# Patient Record
Sex: Female | Born: 1935
Health system: Southern US, Community
[De-identification: ages and names within clinical notes are randomized; demographics above are authoritative.]

## PROBLEM LIST (undated history)

## (undated) DIAGNOSIS — K219 Gastro-esophageal reflux disease without esophagitis: Secondary | ICD-10-CM

## (undated) DIAGNOSIS — I10 Essential (primary) hypertension: Secondary | ICD-10-CM

## (undated) DIAGNOSIS — J039 Acute tonsillitis, unspecified: Secondary | ICD-10-CM

## (undated) DIAGNOSIS — Z950 Presence of cardiac pacemaker: Secondary | ICD-10-CM

## (undated) DIAGNOSIS — K449 Diaphragmatic hernia without obstruction or gangrene: Secondary | ICD-10-CM

## (undated) DIAGNOSIS — R51 Headache: Secondary | ICD-10-CM

## (undated) DIAGNOSIS — T8859XA Other complications of anesthesia, initial encounter: Secondary | ICD-10-CM

## (undated) DIAGNOSIS — E785 Hyperlipidemia, unspecified: Secondary | ICD-10-CM

## (undated) DIAGNOSIS — T4145XA Adverse effect of unspecified anesthetic, initial encounter: Secondary | ICD-10-CM

## (undated) DIAGNOSIS — K529 Noninfective gastroenteritis and colitis, unspecified: Secondary | ICD-10-CM

## (undated) DIAGNOSIS — Z8719 Personal history of other diseases of the digestive system: Secondary | ICD-10-CM

## (undated) DIAGNOSIS — K635 Polyp of colon: Secondary | ICD-10-CM

## (undated) DIAGNOSIS — F419 Anxiety disorder, unspecified: Secondary | ICD-10-CM

## (undated) HISTORY — DX: Hyperlipidemia, unspecified: E78.5

## (undated) HISTORY — DX: Noninfective gastroenteritis and colitis, unspecified: K52.9

## (undated) HISTORY — DX: Essential (primary) hypertension: I10

## (undated) HISTORY — PX: BREAST LUMPECTOMY: SHX2

## (undated) HISTORY — DX: Polyp of colon: K63.5

## (undated) HISTORY — DX: Presence of cardiac pacemaker: Z95.0

## (undated) HISTORY — PX: HERNIA REPAIR: SHX51

## (undated) HISTORY — PX: BUNIONECTOMY: SHX129

## (undated) HISTORY — DX: Gastro-esophageal reflux disease without esophagitis: K21.9

## (undated) HISTORY — DX: Diaphragmatic hernia without obstruction or gangrene: K44.9

## (undated) HISTORY — DX: Acute tonsillitis, unspecified: J03.90

---

## 1968-11-12 HISTORY — PX: APPENDECTOMY: SHX54

## 1968-11-12 HISTORY — PX: CHOLECYSTECTOMY: SHX55

## 1974-11-12 HISTORY — PX: ABDOMINAL HYSTERECTOMY: SHX81

## 1999-06-21 ENCOUNTER — Other Ambulatory Visit: Admission: RE | Admit: 1999-06-21 | Discharge: 1999-06-21 | Payer: Self-pay | Admitting: General Surgery

## 1999-06-21 ENCOUNTER — Encounter (INDEPENDENT_AMBULATORY_CARE_PROVIDER_SITE_OTHER): Payer: Self-pay | Admitting: Specialist

## 2000-03-07 ENCOUNTER — Ambulatory Visit (HOSPITAL_COMMUNITY): Admission: AD | Admit: 2000-03-07 | Discharge: 2000-03-07 | Payer: Self-pay | Admitting: Obstetrics & Gynecology

## 2001-07-22 ENCOUNTER — Encounter: Payer: Self-pay | Admitting: Internal Medicine

## 2001-07-22 ENCOUNTER — Ambulatory Visit (HOSPITAL_COMMUNITY): Admission: RE | Admit: 2001-07-22 | Discharge: 2001-07-22 | Payer: Self-pay | Admitting: Internal Medicine

## 2001-11-12 DIAGNOSIS — K635 Polyp of colon: Secondary | ICD-10-CM

## 2001-11-12 HISTORY — PX: COLONOSCOPY W/ POLYPECTOMY: SHX1380

## 2001-11-12 HISTORY — DX: Polyp of colon: K63.5

## 2004-11-03 ENCOUNTER — Ambulatory Visit: Payer: Self-pay | Admitting: Internal Medicine

## 2004-11-12 DIAGNOSIS — J039 Acute tonsillitis, unspecified: Secondary | ICD-10-CM

## 2004-11-12 HISTORY — DX: Acute tonsillitis, unspecified: J03.90

## 2004-12-12 ENCOUNTER — Ambulatory Visit: Payer: Self-pay | Admitting: Internal Medicine

## 2005-01-04 ENCOUNTER — Inpatient Hospital Stay (HOSPITAL_COMMUNITY): Admission: AD | Admit: 2005-01-04 | Discharge: 2005-01-06 | Payer: Self-pay | Admitting: Internal Medicine

## 2005-01-04 ENCOUNTER — Ambulatory Visit: Payer: Self-pay | Admitting: Internal Medicine

## 2005-01-11 ENCOUNTER — Ambulatory Visit: Payer: Self-pay | Admitting: Internal Medicine

## 2005-01-26 ENCOUNTER — Emergency Department (HOSPITAL_COMMUNITY): Admission: EM | Admit: 2005-01-26 | Discharge: 2005-01-26 | Payer: Self-pay | Admitting: Emergency Medicine

## 2005-01-29 ENCOUNTER — Ambulatory Visit: Payer: Self-pay | Admitting: Internal Medicine

## 2005-02-01 ENCOUNTER — Ambulatory Visit: Payer: Self-pay

## 2005-05-17 ENCOUNTER — Ambulatory Visit: Payer: Self-pay | Admitting: Internal Medicine

## 2005-06-13 ENCOUNTER — Ambulatory Visit: Payer: Self-pay | Admitting: Internal Medicine

## 2005-08-02 ENCOUNTER — Ambulatory Visit: Payer: Self-pay | Admitting: Internal Medicine

## 2005-08-16 ENCOUNTER — Ambulatory Visit: Payer: Self-pay | Admitting: Internal Medicine

## 2005-08-28 ENCOUNTER — Encounter: Admission: RE | Admit: 2005-08-28 | Discharge: 2005-08-28 | Payer: Self-pay | Admitting: Internal Medicine

## 2005-08-31 ENCOUNTER — Ambulatory Visit: Payer: Self-pay | Admitting: Internal Medicine

## 2006-01-09 ENCOUNTER — Ambulatory Visit: Payer: Self-pay | Admitting: Internal Medicine

## 2006-01-24 ENCOUNTER — Ambulatory Visit: Payer: Self-pay | Admitting: Internal Medicine

## 2006-05-13 ENCOUNTER — Ambulatory Visit: Payer: Self-pay | Admitting: Internal Medicine

## 2006-05-20 ENCOUNTER — Ambulatory Visit: Payer: Self-pay | Admitting: Internal Medicine

## 2006-12-13 ENCOUNTER — Ambulatory Visit: Payer: Self-pay | Admitting: Internal Medicine

## 2006-12-13 LAB — CONVERTED CEMR LAB
ALT: 16 units/L (ref 0–40)
AST: 23 units/L (ref 0–37)
BUN: 18 mg/dL (ref 6–23)
Cholesterol: 153 mg/dL (ref 0–200)
Creatinine, Ser: 1.3 mg/dL — ABNORMAL HIGH (ref 0.4–1.2)
Creatinine,U: 119.8 mg/dL
HDL: 48.4 mg/dL (ref >39.0)
Hgb A1c MFr Bld: 5.7 % (ref 4.6–6.0)
LDL Cholesterol: 85 mg/dL (ref 0–99)
Microalb Creat Ratio: 4.2 mg/g (ref 0.0–30.0)
Microalb, Ur: 0.5 mg/dL (ref 0.0–1.9)
Potassium: 3.6 meq/L (ref 3.5–5.1)
Total CHOL/HDL Ratio: 3.2
Triglycerides: 97 mg/dL (ref 0–149)
VLDL: 19 mg/dL (ref 0–40)

## 2006-12-23 ENCOUNTER — Ambulatory Visit: Payer: Self-pay | Admitting: Internal Medicine

## 2007-08-05 ENCOUNTER — Telehealth (INDEPENDENT_AMBULATORY_CARE_PROVIDER_SITE_OTHER): Payer: Self-pay | Admitting: *Deleted

## 2007-08-06 ENCOUNTER — Ambulatory Visit: Payer: Self-pay | Admitting: Internal Medicine

## 2007-08-10 LAB — CONVERTED CEMR LAB
ALT: 19 U/L
AST: 27 U/L
BUN: 19 mg/dL
Cholesterol: 140 mg/dL
Creatinine, Ser: 1.3 mg/dL — ABNORMAL HIGH
Creatinine,U: 117.6 mg/dL
HDL: 44.8 mg/dL
Hgb A1c MFr Bld: 5.8 %
LDL Cholesterol: 78 mg/dL
Microalb Creat Ratio: 11.9 mg/g
Microalb, Ur: 1.4 mg/dL
Potassium: 3.8 meq/L
Total CHOL/HDL Ratio: 3.1
Triglycerides: 84 mg/dL
VLDL: 17 mg/dL

## 2007-08-11 ENCOUNTER — Encounter (INDEPENDENT_AMBULATORY_CARE_PROVIDER_SITE_OTHER): Payer: Self-pay | Admitting: *Deleted

## 2007-08-12 ENCOUNTER — Ambulatory Visit: Payer: Self-pay | Admitting: Internal Medicine

## 2007-08-12 DIAGNOSIS — K219 Gastro-esophageal reflux disease without esophagitis: Secondary | ICD-10-CM

## 2007-08-12 DIAGNOSIS — E782 Mixed hyperlipidemia: Secondary | ICD-10-CM | POA: Insufficient documentation

## 2007-08-12 DIAGNOSIS — I10 Essential (primary) hypertension: Secondary | ICD-10-CM

## 2007-08-12 DIAGNOSIS — E8881 Metabolic syndrome: Secondary | ICD-10-CM

## 2007-08-12 LAB — CONVERTED CEMR LAB
Cholesterol, target level: 200 mg/dL
HDL goal, serum: 40 mg/dL
LDL Goal: 160 mg/dL

## 2007-08-20 ENCOUNTER — Ambulatory Visit: Payer: Self-pay | Admitting: Family Medicine

## 2007-08-20 ENCOUNTER — Encounter (INDEPENDENT_AMBULATORY_CARE_PROVIDER_SITE_OTHER): Payer: Self-pay | Admitting: *Deleted

## 2007-10-20 ENCOUNTER — Encounter: Payer: Self-pay | Admitting: Internal Medicine

## 2008-02-12 ENCOUNTER — Ambulatory Visit: Payer: Self-pay | Admitting: Internal Medicine

## 2008-02-14 LAB — CONVERTED CEMR LAB: Hgb A1c MFr Bld: 5.6 % (ref 4.6–6.0)

## 2008-02-16 ENCOUNTER — Encounter (INDEPENDENT_AMBULATORY_CARE_PROVIDER_SITE_OTHER): Payer: Self-pay | Admitting: *Deleted

## 2008-02-19 ENCOUNTER — Ambulatory Visit: Payer: Self-pay | Admitting: Internal Medicine

## 2008-02-19 DIAGNOSIS — IMO0001 Reserved for inherently not codable concepts without codable children: Secondary | ICD-10-CM | POA: Insufficient documentation

## 2008-02-19 DIAGNOSIS — R61 Generalized hyperhidrosis: Secondary | ICD-10-CM

## 2008-04-20 ENCOUNTER — Ambulatory Visit: Payer: Self-pay | Admitting: Internal Medicine

## 2008-04-20 DIAGNOSIS — F411 Generalized anxiety disorder: Secondary | ICD-10-CM | POA: Insufficient documentation

## 2008-04-23 ENCOUNTER — Telehealth (INDEPENDENT_AMBULATORY_CARE_PROVIDER_SITE_OTHER): Payer: Self-pay | Admitting: *Deleted

## 2008-04-23 LAB — CONVERTED CEMR LAB: Potassium: 4 meq/L (ref 3.5–5.1)

## 2008-05-05 ENCOUNTER — Ambulatory Visit: Payer: Self-pay | Admitting: Internal Medicine

## 2008-05-05 DIAGNOSIS — N183 Chronic kidney disease, stage 3 (moderate): Secondary | ICD-10-CM

## 2008-06-07 ENCOUNTER — Ambulatory Visit: Payer: Self-pay | Admitting: Internal Medicine

## 2008-06-14 LAB — CONVERTED CEMR LAB
BUN: 18 mg/dL (ref 6–23)
Creatinine, Ser: 1.5 mg/dL — ABNORMAL HIGH (ref 0.4–1.2)

## 2008-06-16 ENCOUNTER — Encounter (INDEPENDENT_AMBULATORY_CARE_PROVIDER_SITE_OTHER): Payer: Self-pay | Admitting: *Deleted

## 2008-07-12 ENCOUNTER — Telehealth (INDEPENDENT_AMBULATORY_CARE_PROVIDER_SITE_OTHER): Payer: Self-pay | Admitting: *Deleted

## 2008-07-22 ENCOUNTER — Telehealth (INDEPENDENT_AMBULATORY_CARE_PROVIDER_SITE_OTHER): Payer: Self-pay | Admitting: *Deleted

## 2008-07-22 ENCOUNTER — Encounter: Payer: Self-pay | Admitting: Internal Medicine

## 2008-07-26 ENCOUNTER — Ambulatory Visit: Payer: Self-pay | Admitting: Internal Medicine

## 2008-07-30 ENCOUNTER — Encounter (INDEPENDENT_AMBULATORY_CARE_PROVIDER_SITE_OTHER): Payer: Self-pay | Admitting: *Deleted

## 2008-07-30 LAB — CONVERTED CEMR LAB
AST: 22 units/L (ref 0–37)
BUN: 24 mg/dL — ABNORMAL HIGH (ref 6–23)
Cholesterol: 123 mg/dL (ref 0–200)
Creatinine,U: 82.9 mg/dL
HDL: 39.1 mg/dL (ref >39.0)
Hgb A1c MFr Bld: 5.9 % (ref 4.6–6.0)
Potassium: 4.6 meq/L (ref 3.5–5.1)

## 2008-08-02 ENCOUNTER — Telehealth (INDEPENDENT_AMBULATORY_CARE_PROVIDER_SITE_OTHER): Payer: Self-pay | Admitting: *Deleted

## 2008-08-10 ENCOUNTER — Ambulatory Visit: Payer: Self-pay | Admitting: Internal Medicine

## 2008-08-16 ENCOUNTER — Telehealth (INDEPENDENT_AMBULATORY_CARE_PROVIDER_SITE_OTHER): Payer: Self-pay | Admitting: *Deleted

## 2008-09-16 ENCOUNTER — Ambulatory Visit: Payer: Self-pay | Admitting: Internal Medicine

## 2008-09-23 ENCOUNTER — Encounter: Admission: RE | Admit: 2008-09-23 | Discharge: 2008-09-23 | Payer: Self-pay | Admitting: Internal Medicine

## 2008-09-24 ENCOUNTER — Telehealth (INDEPENDENT_AMBULATORY_CARE_PROVIDER_SITE_OTHER): Payer: Self-pay | Admitting: *Deleted

## 2008-10-18 ENCOUNTER — Telehealth (INDEPENDENT_AMBULATORY_CARE_PROVIDER_SITE_OTHER): Payer: Self-pay | Admitting: *Deleted

## 2008-10-20 ENCOUNTER — Encounter: Payer: Self-pay | Admitting: Internal Medicine

## 2008-11-09 ENCOUNTER — Ambulatory Visit: Payer: Self-pay | Admitting: Internal Medicine

## 2008-11-09 LAB — CONVERTED CEMR LAB: Creatinine, Ser: 1.2 mg/dL (ref 0.4–1.2)

## 2008-11-10 ENCOUNTER — Encounter (INDEPENDENT_AMBULATORY_CARE_PROVIDER_SITE_OTHER): Payer: Self-pay | Admitting: *Deleted

## 2008-11-30 ENCOUNTER — Telehealth (INDEPENDENT_AMBULATORY_CARE_PROVIDER_SITE_OTHER): Payer: Self-pay | Admitting: *Deleted

## 2009-04-11 ENCOUNTER — Encounter: Payer: Self-pay | Admitting: Internal Medicine

## 2009-04-18 ENCOUNTER — Telehealth (INDEPENDENT_AMBULATORY_CARE_PROVIDER_SITE_OTHER): Payer: Self-pay | Admitting: *Deleted

## 2009-05-05 ENCOUNTER — Ambulatory Visit: Payer: Self-pay | Admitting: Internal Medicine

## 2009-05-16 LAB — CONVERTED CEMR LAB
BUN: 23 mg/dL (ref 6–23)
Creatinine, Ser: 1.4 mg/dL — ABNORMAL HIGH (ref 0.4–1.2)
Hgb A1c MFr Bld: 6 % (ref 4.6–6.5)

## 2009-05-19 ENCOUNTER — Telehealth (INDEPENDENT_AMBULATORY_CARE_PROVIDER_SITE_OTHER): Payer: Self-pay | Admitting: *Deleted

## 2009-05-19 ENCOUNTER — Encounter (INDEPENDENT_AMBULATORY_CARE_PROVIDER_SITE_OTHER): Payer: Self-pay | Admitting: *Deleted

## 2009-07-14 ENCOUNTER — Ambulatory Visit: Payer: Self-pay | Admitting: Internal Medicine

## 2009-07-22 LAB — CONVERTED CEMR LAB: Creatinine, Ser: 1.3 mg/dL — ABNORMAL HIGH (ref 0.4–1.2)

## 2009-07-26 ENCOUNTER — Encounter (INDEPENDENT_AMBULATORY_CARE_PROVIDER_SITE_OTHER): Payer: Self-pay | Admitting: *Deleted

## 2009-08-10 ENCOUNTER — Encounter: Payer: Self-pay | Admitting: Internal Medicine

## 2009-08-31 ENCOUNTER — Encounter (INDEPENDENT_AMBULATORY_CARE_PROVIDER_SITE_OTHER): Payer: Self-pay | Admitting: *Deleted

## 2009-08-31 ENCOUNTER — Ambulatory Visit: Payer: Self-pay | Admitting: Internal Medicine

## 2009-09-04 LAB — CONVERTED CEMR LAB
Bilirubin, Direct: 0 mg/dL (ref 0.0–0.3)
LDL Cholesterol: 94 mg/dL (ref 0–99)
Total Bilirubin: 0.8 mg/dL (ref 0.3–1.2)
VLDL: 23.4 mg/dL (ref 0.0–40.0)

## 2009-09-05 ENCOUNTER — Encounter (INDEPENDENT_AMBULATORY_CARE_PROVIDER_SITE_OTHER): Payer: Self-pay | Admitting: *Deleted

## 2009-09-06 ENCOUNTER — Ambulatory Visit: Payer: Self-pay | Admitting: Internal Medicine

## 2009-09-06 DIAGNOSIS — R079 Chest pain, unspecified: Secondary | ICD-10-CM

## 2009-10-03 ENCOUNTER — Ambulatory Visit: Payer: Self-pay | Admitting: Internal Medicine

## 2009-10-10 ENCOUNTER — Telehealth (INDEPENDENT_AMBULATORY_CARE_PROVIDER_SITE_OTHER): Payer: Self-pay | Admitting: *Deleted

## 2009-10-11 ENCOUNTER — Encounter (HOSPITAL_COMMUNITY): Admission: RE | Admit: 2009-10-11 | Discharge: 2009-11-11 | Payer: Self-pay | Admitting: Internal Medicine

## 2009-10-11 ENCOUNTER — Ambulatory Visit: Payer: Self-pay | Admitting: Cardiovascular Disease

## 2009-10-11 ENCOUNTER — Ambulatory Visit: Payer: Self-pay

## 2009-10-12 ENCOUNTER — Encounter: Payer: Self-pay | Admitting: Cardiovascular Disease

## 2009-10-18 ENCOUNTER — Telehealth (INDEPENDENT_AMBULATORY_CARE_PROVIDER_SITE_OTHER): Payer: Self-pay | Admitting: *Deleted

## 2009-10-19 ENCOUNTER — Encounter: Payer: Self-pay | Admitting: Internal Medicine

## 2009-11-15 ENCOUNTER — Ambulatory Visit: Payer: Self-pay | Admitting: Internal Medicine

## 2009-12-23 ENCOUNTER — Ambulatory Visit: Payer: Self-pay | Admitting: Internal Medicine

## 2009-12-23 DIAGNOSIS — I498 Other specified cardiac arrhythmias: Secondary | ICD-10-CM

## 2010-04-05 ENCOUNTER — Emergency Department (HOSPITAL_COMMUNITY): Admission: EM | Admit: 2010-04-05 | Discharge: 2010-04-05 | Payer: Self-pay | Admitting: Emergency Medicine

## 2010-04-11 ENCOUNTER — Ambulatory Visit: Payer: Self-pay | Admitting: Internal Medicine

## 2010-04-11 DIAGNOSIS — S02400A Malar fracture unspecified, initial encounter for closed fracture: Secondary | ICD-10-CM | POA: Insufficient documentation

## 2010-04-11 DIAGNOSIS — S02401A Maxillary fracture, unspecified, initial encounter for closed fracture: Secondary | ICD-10-CM

## 2010-04-14 ENCOUNTER — Encounter: Payer: Self-pay | Admitting: Internal Medicine

## 2010-10-02 ENCOUNTER — Encounter: Payer: Self-pay | Admitting: Internal Medicine

## 2010-10-02 ENCOUNTER — Ambulatory Visit: Payer: Self-pay | Admitting: Internal Medicine

## 2010-10-02 DIAGNOSIS — R109 Unspecified abdominal pain: Secondary | ICD-10-CM | POA: Insufficient documentation

## 2010-10-09 LAB — CONVERTED CEMR LAB
ALT: 19 units/L (ref 0–35)
BUN: 18 mg/dL (ref 6–23)
Bilirubin, Direct: 0.1 mg/dL (ref 0.0–0.3)
Cholesterol: 157 mg/dL (ref 0–200)
Creatinine, Ser: 1.3 mg/dL — ABNORMAL HIGH (ref 0.4–1.2)
Creatinine,U: 195.6 mg/dL
Eosinophils Relative: 3.1 % (ref 0.0–5.0)
GFR calc non Af Amer: 41.64 mL/min (ref >60)
Hgb A1c MFr Bld: 5.8 % (ref 4.6–6.5)
LDL Cholesterol: 95 mg/dL (ref 0–99)
MCV: 88.7 fL (ref 78.0–100.0)
Microalb Creat Ratio: 1.1 mg/g (ref 0.0–30.0)
Monocytes Absolute: 0.5 10*3/uL (ref 0.1–1.0)
Monocytes Relative: 11.1 % (ref 3.0–12.0)
Neutrophils Relative %: 55.2 % (ref 43.0–77.0)
Platelets: 212 10*3/uL (ref 150.0–400.0)
Total Bilirubin: 0.6 mg/dL (ref 0.3–1.2)
Triglycerides: 95 mg/dL (ref 0.0–149.0)
VLDL: 19 mg/dL (ref 0.0–40.0)
WBC: 4.9 10*3/uL (ref 4.5–10.5)

## 2010-10-10 ENCOUNTER — Ambulatory Visit: Payer: Self-pay | Admitting: Internal Medicine

## 2010-10-18 ENCOUNTER — Encounter (INDEPENDENT_AMBULATORY_CARE_PROVIDER_SITE_OTHER): Payer: Self-pay | Admitting: *Deleted

## 2010-10-18 ENCOUNTER — Ambulatory Visit: Payer: Self-pay | Admitting: Internal Medicine

## 2010-10-18 LAB — CONVERTED CEMR LAB: OCCULT 1: NEGATIVE

## 2010-12-14 NOTE — Letter (Signed)
Summary: Patient Questionnaire  Patient Questionnaire   Imported By: Lanelle Bal 12/01/2010 09:59:32  _____________________________________________________________________  External Attachment:    Type:   Image     Comment:   External Document

## 2010-12-14 NOTE — Consult Note (Signed)
Summary: [facility]  [facility]   Imported By: Lanelle Bal 04/24/2010 13:59:49  _____________________________________________________________________  External Attachment:    Type:   Image     Comment:   External Document

## 2010-12-14 NOTE — Assessment & Plan Note (Signed)
Summary: cpx & lab/cbs   Vital Signs:  Patient profile:   75 year old female Height:      60.75 inches Weight:      202.6 pounds BMI:     38.74 Temp:     97.3 degrees F oral Pulse rate:   64 / minute Resp:     16 per minute BP sitting:   124 / 80  (left arm) Cuff size:   large  Vitals Entered By: Shonna Chock CMA (October 02, 2010 10:10 AM) CC: CPX with fasting labs , Abdominal pain Comments Patient aware TD and pneumonia vaccine should be updated    Primary Care Provider:  Marga Melnick MD  CC:  CPX with fasting labs  and Abdominal pain.  History of Present Illness: Here for Medicare AWV:  1.   Risk factors based on Past M, S, F history: ERD;HTN; DM( chart updated) 2.   Physical Activities: walking, statinary bike, treadmill 3-4X/weekX 30 min 3.   Depression/mood:issues  denied 4.   Hearing: whisper heard @ 6 ft 5.   ADL's: no limitations 6.   Fall Risk: none ; she did trip X1 this year & sustained facial trauma 7.   Home Safety: plans being made to put barrier in front of brick hearth 8.   Height, weight, &visual acuity:wall chart read @ 6 ft w/o lenses 9.   Counseling: POA not in place 10.   Labs ordered based on risk factors: see Orders 11.           Referral Coordination: none requested  12.           Care Plan: see Instructions 13.            Cognitive Assessment: Oriented X 3; memory & recall excellent; "WORLD" spelled backwards; mood & affect normal. Abdominal Pain      This is a 75 year old woman who  describes s with Abdominal pain for 10 years.  The patient denies nausea, vomiting, diarrhea, constipation, melena, hematochezia, and hematemesis.  The location of the pain is right upper quadrant and left upper quadrant.  The pain is described as intermittent and sharp.  The patient denies the following symptoms: jaundice, dark urine, and vaginal bleeding.  The pain is worse with  fried  food   or lettuce over several days.  The pain is better with antacids, TUMS.   Evaluation included colonoscopy.  Preventive Screening-Counseling & Management  Alcohol-Tobacco     Alcohol drinks/day: 0     Smoking Status: never  Caffeine-Diet-Exercise     Caffeine use/day: all decaf     Diet Comments: decreased hyperglycemic carbs  Hep-HIV-STD-Contraception     Dental Visit-last 6 months yes     Sun Exposure-Excessive: no  Safety-Violence-Falls     Seat Belt Use: yes     Firearms in the Home: firearms in the home     Firearm Counseling: not indicated; uses recommended firearm safety measures      Blood Transfusions:  no.        Travel History:  never.    Current Medications (verified): 1)  Actoplus Met 15-500 Mg  Tabs (Pioglitazone Hcl-Metformin Hcl) .... **appointment Due**take One Tablet  Once Daily With Largest Meal 2)  Toprol Xl 100 Mg Xr24h-Tab (Metoprolol Succinate) .... Take One Tablet Daily **yearly Appointment Due** 3)  Fish Oil 500 Mg  Caps (Omega-3 Fatty Acids) .Marland Kitchen.. 1 Tabs Daily 4)  Vytorin 10-20 Mg  Tabs (Ezetimibe-Simvastatin) .Marland Kitchen.. 1 By Mouth  Qhs 5)  Asa 81mg  Qd 6)  Multivitamin 7)  Folic Acid 8)  Gabapentin 100 Mg  Caps (Gabapentin) .Marland Kitchen.. 1 By Mouth Every 8 Hours As Needed For Headaches 9)  Prilosec Otc 20 Mg Tbec (Omeprazole Magnesium) .Marland Kitchen.. 1 Tab Once Daily 10)  Citalopram Hydrobromide 20 Mg  Tabs (Citalopram Hydrobromide) .Marland Kitchen.. 1 Once Daily 11)  Vitamin D3 1000 Unit Tabs (Cholecalciferol) .Marland Kitchen.. 1 By Mouth Once Daily 12)  Cal-Mag-Vit-D (1200mg -600mg -100iu) .... 3 By Mouth At Bedtime 13)  Cozaar 100 Mg Tabs (Losartan Potassium) .Marland Kitchen.. 1 Once Daily 14)  Amlodipine Besylate 5 Mg Tabs (Amlodipine Besylate) .Marland Kitchen.. 1 Tablet Two Times Per Day  Allergies: 1)  ! Hydrochlorothiazide 2)  ! * Evista 3)  ! Vioxx 4)  ! * Hyzaar  Past History:  Past Medical History: Hyperlipidemia Hypertension with LVH on ECHO Gastroenteritis with renal insufficiency  in context of protracted nausea & vomiting  2006 ; Tonsilitis 2006  Past Surgical  History: Appendectomy Cholecystectomyin 1970s Hysterectomy & BSO for endometriosis & benign tumor Lumpectomy X 2 Colon polypectomy 2003  Dr Leone Payor  Family History:  Father: MI  @ age 14, d age 70 Mother:CAD,IDDM, CABG @ 44 Siblings: sister :CABG @ 45; son: ? lymphatic cancer ;PGF: CVA;MGF: CAD;PGM: aneurysm;M aunts: DM, stomach & breast cancer  Social History: Married Retired Never Smoked Alcohol use-no Caffeine use/day:  all decaf Dental Care w/in 6 mos.:  yes Sun Exposure-Excessive:  no Risk analyst Use:  yes Blood Transfusions:  no  Review of Systems  The patient denies anorexia, fever, weight loss, weight gain, vision loss, decreased hearing, hoarseness, chest pain, syncope, dyspnea on exertion, peripheral edema, prolonged cough, headaches, hemoptysis, hematuria, incontinence, suspicious skin lesions, depression, unusual weight change, abnormal bleeding, enlarged lymph nodes, and angioedema.    Physical Exam  General:  well-nourished,in no acute distress; alert,appropriate and cooperative throughout examination Head:  Normocephalic and atraumatic without obvious abnormalities.  Eyes:  No corneal or conjunctival inflammation noted. Perrla. Funduscopic exam benign, without hemorrhages, exudates or papilledema.No icterus Ears:  External ear exam shows no significant lesions or deformities.  Otoscopic examination reveals clear canals, tympanic membranes are intact bilaterally without bulging, retraction, inflammation or discharge. Hearing is grossly normal bilaterally. Nose:  External nasal examination shows no deformity or inflammation. Nasal mucosa are pink and moist without lesions or exudates. Mouth:  Oral mucosa and oropharynx without lesions or exudates.  Teeth in good repair. Neck:  No deformities, masses, or tenderness noted. Lungs:  Normal respiratory effort, chest expands symmetrically. Lungs are clear to auscultation, no crackles or wheezes. Heart:  normal rate, regular  rhythm, no gallop, no rub, no JVD, no HJR, and grade 1 /6 systolic murmur.   Abdomen:  Bowel sounds positive,abdomen soft and non-tender without masses, organomegaly or hernias noted. RUQ op scar Genitalia:  Dr Ambrose Mantle, Gyn of record Msk:  lORDOSIS OF THORACIC SPINE Pulses:  R and L carotid,radial,dorsalis pedis and posterior tibial pulses are full and equal bilaterally Extremities:  No clubbing, cyanosis, edema, or deformity noted with normal full range of motion of all joints.   Neurologic:  alert & oriented X3 and DTRs symmetrical and normal.   Skin:  Intact without suspicious lesions or rashes Cervical Nodes:  No lymphadenopathy noted Axillary Nodes:  No palpable lymphadenopathy Psych:  memory intact for recent and remote, normally interactive, and good eye contact.     Impression & Recommendations:  Problem # 1:  PREVENTIVE HEALTH CARE (ICD-V70.0)  Problem # 2:  ABDOMINAL PAIN,  CHRONIC (ICD-789.00)  > 10 years  Orders: Venipuncture (44010) TLB-CBC Platelet - w/Differential (85025-CBCD)  Problem # 3:  ACID REFLUX DISEASE (ICD-530.81)  Her updated medication list for this problem includes:    Prilosec Otc 20 Mg Tbec (Omeprazole magnesium) .Marland Kitchen... 1 tab once daily  Orders: Venipuncture (27253)  Problem # 4:  DISORDER, DYSMETABOLIC SYNDROME X (ICD-277.7)  Orders: TLB-A1C / Hgb A1C (Glycohemoglobin) (83036-A1C) TLB-Microalbumin/Creat Ratio, Urine (82043-MALB)  Problem # 5:  HYPERTENSION, ESSENTIAL NOS (ICD-401.9)  Her updated medication list for this problem includes:    Toprol Xl 100 Mg Xr24h-tab (Metoprolol succinate) .Marland Kitchen... Take one tablet daily **yearly appointment due**    Cozaar 100 Mg Tabs (Losartan potassium) .Marland Kitchen... 1 once daily    Amlodipine Besylate 5 Mg Tabs (Amlodipine besylate) .Marland Kitchen... 1 tablet two times per day  Orders: EKG w/ Interpretation (93000) Venipuncture (66440) TLB-BMP (Basic Metabolic Panel-BMET) (80048-METABOL)  Problem # 6:  HYPERLIPIDEMIA  (ICD-272.2)  Her updated medication list for this problem includes:    Vytorin 10-20 Mg Tabs (Ezetimibe-simvastatin) .Marland Kitchen... 1 by mouth qhs  Orders: Venipuncture (34742) TLB-Lipid Panel (80061-LIPID) TLB-Hepatic/Liver Function Pnl (80076-HEPATIC) TLB-TSH (Thyroid Stimulating Hormone) (84443-TSH)  Complete Medication List: 1)  Actoplus Met 15-500 Mg Tabs (Pioglitazone hcl-metformin hcl) .... **appointment due**take one tablet  once daily with largest meal 2)  Toprol Xl 100 Mg Xr24h-tab (Metoprolol succinate) .... Take one tablet daily **yearly appointment due** 3)  Fish Oil 500 Mg Caps (Omega-3 fatty acids) .Marland Kitchen.. 1 tabs daily 4)  Vytorin 10-20 Mg Tabs (Ezetimibe-simvastatin) .Marland Kitchen.. 1 by mouth qhs 5)  Asa 81mg  Qd  6)  Multivitamin  7)  Folic Acid  8)  Gabapentin 595 Mg Caps (Gabapentin) .Marland Kitchen.. 1 by mouth every 8 hours as needed for headaches 9)  Prilosec Otc 20 Mg Tbec (Omeprazole magnesium) .Marland Kitchen.. 1 tab once daily 10)  Citalopram Hydrobromide 20 Mg Tabs (Citalopram hydrobromide) .Marland Kitchen.. 1 once daily 11)  Vitamin D3 1000 Unit Tabs (Cholecalciferol) .Marland Kitchen.. 1 by mouth once daily 12)  Cal-mag-vit-d (1200mg -600mg -100iu)  .... 3 by mouth at bedtime 13)  Cozaar 100 Mg Tabs (Losartan potassium) .Marland Kitchen.. 1 once daily 14)  Amlodipine Besylate 5 Mg Tabs (Amlodipine besylate) .Marland Kitchen.. 1 tablet two times per day  Other Orders: Flu Vaccine 2yrs + MEDICARE PATIENTS (G3875) Administration Flu vaccine - MCR (G0008) MC -Subsequent Annual Wellness Visit [phone])  Patient Instructions: 1)  Consider completing  Healthcare    POA as discussed . 2)  Avoid foods high in acid (tomatoes, citrus juices, spicy foods). Avoid eating within two hours of lying down or before exercising. Do not over eat; try smaller more frequent meals. Elevate head of bed twelve inches when sleeping. Complete stool cards. 3)  Check your Blood Pressure regularly. If it is above: 135/85 ON AVERAGE  you should make an appointment. Colonoscopy as per Dr  Leone Payor Prescriptions: AMLODIPINE BESYLATE 5 MG TABS (AMLODIPINE BESYLATE) 1 tablet two times per day  #180 x 3   Entered and Authorized by:   Marga Melnick MD   Signed by:   Marga Melnick MD on 10/02/2010   Method used:   Print then Give to Patient   RxID:   [id number]    Orders Added: 1)  Flu Vaccine 67yrs + MEDICARE PATIENTS [Q2039] 2)  Administration Flu vaccine - MCR [G0008] 3)  MC -Subsequent Annual Wellness Visit [G0439] 4)  Est. Patient Level III [10932] 5)  EKG w/ Interpretation [93000] 6)  Venipuncture [36415] 7)  TLB-Lipid Panel [80061-LIPID] 8)  TLB-BMP (Basic  Metabolic Panel-BMET) [80048-METABOL] 9)  TLB-CBC Platelet - w/Differential [85025-CBCD] 10)  TLB-Hepatic/Liver Function Pnl [80076-HEPATIC] 11)  TLB-TSH (Thyroid Stimulating Hormone) [84443-TSH] 12)  TLB-A1C / Hgb A1C (Glycohemoglobin) [83036-A1C] 13)  TLB-Microalbumin/Creat Ratio, Urine [82043-MALB] Flu Vaccine Consent Questions     Do you have a history of severe allergic reactions to this vaccine? no    Any prior history of allergic reactions to egg and/or gelatin? no    Do you have a sensitivity to the preservative Thimersol? no    Do you have a past history of Guillan-Barre Syndrome? no    Do you currently have an acute febrile illness? no    Have you ever had a severe reaction to latex? no    Vaccine information given and explained to patient? yes    Are you currently pregnant? no    Lot Number:[id number]   Exp Date:05/12/2011   Site Given  Right Deltoid IM Vaccine 21yrs + MEDICARE PATIENTS [Q2039] 2)  Administration Flu vaccine - MCR [G0008]    .lbmedflu

## 2010-12-14 NOTE — Letter (Signed)
Summary: Results Follow up Letter  [facility]  [address] [place], [place] [address]   Phone: [phone]  Fax: [phone]    10/18/2010 MRN: [id number]  Kylie Patrick [address] [place], [place]  [address]  Dear Ms. Andrey Campanile,  The following are the results of your recent test(s):  Test         Result    Pap Smear:        Normal _____  Not Normal _____ Comments: ______________________________________________________ Cholesterol: LDL(Bad cholesterol):         Your goal is less than:         HDL (Good cholesterol):       Your goal is more than: Comments:  ______________________________________________________ Mammogram:        Normal _____  Not Normal _____ Comments:  ___________________________________________________________________ Hemoccult:        Normal __X___  Not normal _______ Comments:    _____________________________________________________________________ Other Tests:    We routinely do not discuss normal results over the telephone.  If you desire a copy of the results, or you have any questions about this information we can discuss them at your next office visit.   Sincerely,

## 2010-12-14 NOTE — Assessment & Plan Note (Signed)
Summary: PNEUMONIA VAC/CBS   Nurse Visit  CC: Pneumo vacc/kb   Allergies: 1)  ! Hydrochlorothiazide 2)  ! * Evista 3)  ! Vioxx 4)  ! * Hyzaar  Immunizations Administered:  Pneumonia Vaccine:    Vaccine Type: Pneumovax    Site: left deltoid    Mfr: Merck    Dose: 0.5 ml    Route: IM    Given by: Lucious Groves CMA    Exp. Date: 03/13/2012    Lot #: [id number]    VIS given: 10/17/09 version given October 10, 2010.  Orders Added: 1)  Pneumococcal Vaccine [90732] 2)  Admin 1st Vaccine [[address]]

## 2010-12-14 NOTE — Assessment & Plan Note (Signed)
Summary: 1 MO F/U  Medications Added AMLODIPINE BESYLATE 5 MG TABS (AMLODIPINE BESYLATE) 1 tablet two times per day      Allergies Added:   Visit Type:  Follow-up Primary Provider:  Marga Melnick MD  CC:  no cardiac cmplaints.  History of Present Illness: Patient is a 75 year old with a history of hypertension, dyslipideima, obesity.  I saw her for the first time in November for chest pain.  When I saw her I was not convinced it was cardiac but with her risks I recommended a myoview.  She had this done  and it was normal. Since seen, she denies chst pain.  Her breathing is good.   When I saw her I did ask her to cut back on her Clonidine to 1x per day.  She is still on 0.2.  Did not taper further.  She said her feeilng of sweats have gotten better.  Current Medications (verified): 1)  Actoplus Met 15-500 Mg  Tabs (Pioglitazone Hcl-Metformin Hcl) .... Take One Tablet  Once Daily With Largest Meal**labs Due Now** 2)  Toprol Xl 100 Mg Xr24h-Tab (Metoprolol Succinate) .... Take One Tablet Daily 3)  Fish Oil 500 Mg  Caps (Omega-3 Fatty Acids) .... 2 Tabs Daily 4)  Vytorin 10-20 Mg  Tabs (Ezetimibe-Simvastatin) .Marland Kitchen.. 1 By Mouth Qhs 5)  Asa 81mg  Qd 6)  Multivitamin 7)  Folic Acid 8)  Gabapentin 100 Mg  Caps (Gabapentin) .Marland Kitchen.. 1 By Mouth Every 8 Hours As Needed For Headaches 9)  Clonidine Hcl 0.2 Mg Tabs (Clonidine Hcl) .... Take 1 Time Per Day 10)  Prilosec Otc 20 Mg Tbec (Omeprazole Magnesium) .Marland Kitchen.. 1 Tab Once Daily 11)  Citalopram Hydrobromide 20 Mg  Tabs (Citalopram Hydrobromide) .Marland Kitchen.. 1 Once Daily**office Visit Due Now** 12)  Vitamin D3 1000 Unit Tabs (Cholecalciferol) .Marland Kitchen.. 1 By Mouth Once Daily 13)  Cal-Mag-Vit-D (1200mg -600mg -100iu) .... 2 By Mouth At Bedtime 14)  Cozaar 100 Mg Tabs (Losartan Potassium) .Marland Kitchen.. 1 Once Daily 15)  Amlodipine Besylate 5 Mg Tabs (Amlodipine Besylate) .Marland Kitchen.. 1 Tablet Every Day  Allergies (verified): 1)  ! Hydrochlorothiazide 2)  ! * Evista 3)  ! Vioxx 4)  !  * Hyzaar  Past History:  Past Medical History: Last updated: 08/10/2008 Hyperlipidemia Hypertension with LVH on ECHO Gastroenteritis with renal insufficiency post N&V 2006 ; Tonsilitis 2006  Past Surgical History: Last updated: 08/10/2008 Appendectomy Cholecystectomy Hysterectomy & BSO for endometriosis & benign tumor Lumpectomy X 2 Colon polypectomy, repeat due 2012  Family History: Last updated: 08/10/2008  Father: MI age 35, d age 109 Mother:CAD,DM, CABG @ 15 Siblings: sister CABG @ 59; son ?lymphatic CA  Social History: Last updated: 10/03/2009 No tobacco.  No EtOH Married Retired  Vital Signs:  Patient profile:   75 year old female Height:      61 inches Weight:      202 pounds Pulse rate:   62 / minute BP sitting:   144 / 72  (left arm) Cuff size:   large  Vitals Entered By: Burnett Kanaris, CNA (December 23, 2009 9:44 AM)  Physical Exam  Additional Exam:  HEENT:  Normocephalic, atraumatic. EOMI, PERRLA.  Neck: JVP is normal. No thyromegaly. No bruits.  Lungs: clear to auscultation. No rales no wheezes.  Heart: Regular rate and rhythm. Normal S1, S2. No S3.   No significant murmurs. PMI not displaced.  Abdomen:  Supple, nontender. Normal bowel sounds. No masses. No hepatomegaly.  Extremities:   Good distal pulses throughout. No lower  extremity edema.  Musculoskeletal :moving all extremities.  Neuro:   alert and oriented x3.    Impression & Recommendations:  Problem # 1:  CHEST PAIN (ICD-786.50) Atypical.  Resolved.  Normal myoview.  Problem # 2:  BRADYCARDIA (ICD-427.89) Assessment: Improved Improved with less clonidine.  Feels a lttle better.  I would taper to off.  I have given her instruction.  Incrase norvasc to 5 two times a day.  Call in 4 wks with response.  Problem # 3:  HYPERTENSION, ESSENTIAL NOS (ICD-401.9) Changes as noted.  Patient to call with numbers.  Problem # 4:  HYPERLIPIDEMIA (ICD-272.2) Good control.  Continue. Her updated  medication list for this problem includes:    Vytorin 10-20 Mg Tabs (Ezetimibe-simvastatin) .Marland Kitchen... 1 by mouth qhs Prescriptions: AMLODIPINE BESYLATE 5 MG TABS (AMLODIPINE BESYLATE) 1 tablet two times per day  #60 x 11   Entered by:   Layne Benton, RN, BSN   Authorized by:   Sherrill Raring, MD, [facility]   Signed by:   Layne Benton, RN, BSN on 12/23/2009   Method used:   Electronically to        [facility] [address] [id number] (ret[facility])       [address]       [place], [place]  [address]       Ph: [phone]       Fax: [phone]   RxID:   [id number]

## 2010-12-14 NOTE — Assessment & Plan Note (Signed)
Summary: stiches removal//lch   Vital Signs:  Patient profile:   75 year old female Weight:      205 pounds Pulse rate:   72 / minute Resp:     16 per minute BP sitting:   118 / 76  (left arm) Cuff size:   large  Vitals Entered By: Shonna Chock (Apr 11, 2010 10:27 AM) CC: Hospital Folow-Up: remove stiches from elbow(right) and above top lip Comments REVIEWED MED LIST, PATIENT AGREED DOSE AND INSTRUCTION CORRECT    Primary Care Provider:  Marga Melnick MD  CC:  Hospital Folow-Up: remove stiches from elbow(right) and above top lip.  History of Present Illness: Kylie Patrick fell  having tripped on rug & hitting  face & R elbow on raised hearth, necessitating sutures. No LOC; no Neuro or Cardiac trigger for event. No neuro symptoms since.F/U with Dr Lazarus Salines for R maxillary fracture.   Allergies: 1)  ! Hydrochlorothiazide 2)  ! * Evista 3)  ! Vioxx 4)  ! * Hyzaar  Review of Systems General:  Denies chills, fever, and sweats. Eyes:  Denies blurring, double vision, and vision loss-both eyes. ENT:  Some intermittent R frontal headche ; no R facial pain or purulence. Epistaxsis 05/27, self limited.. Neuro:  Denies brief paralysis, disturbances in coordination, numbness, poor balance, tingling, visual disturbances, and weakness.  Physical Exam  General:  in no acute distress; alert,appropriate and cooperative throughout examination Head:  Normocephalic and atraumatic without obvious abnormalities. Non tender R maxilla & R frontal area Eyes:  No corneal or conjunctival inflammation noted. EOMI. Perrla. FOV grossly normal. Ears:  External ear exam shows no significant lesions or deformities.  Otoscopic examination reveals clear canals, tympanic membranes are intact bilaterally without bulging, retraction, inflammation or discharge. Hearing is grossly normal bilaterally. Nose:  External nasal examination shows no deformity or inflammation. Nasal mucosa are pink and moist without lesions or  exudates. Mouth:  Oral mucosa and oropharynx without lesions or exudates.  Teeth: minor chipping L  incisor Neurologic:  alert & oriented X3, gait normal, finger-to-nose normal, and Romberg negative.   Skin:  Sutures well healed. Bruising epigastrium & R knee  Cervical Nodes:  No lymphadenopathy noted Axillary Nodes:  No palpable lymphadenopathy Psych:  memory intact for recent and remote, normally interactive, and good eye contact.     Impression & Recommendations:  Problem # 1:  LACERATION, FACE (ICD-873.40) S/P tripping  on rug Orders: ENT Referral (ENT)  Problem # 2:  MALAR AND MAXILLARY BONES CLOSED FRACTURE (ICD-802.4)  Orders: ENT Referral (ENT)  Complete Medication List: 1)  Actoplus Met 15-500 Mg Tabs (Pioglitazone hcl-metformin hcl) .... Take one tablet  once daily with largest meal**labs due now** 2)  Toprol Xl 100 Mg Xr24h-tab (Metoprolol succinate) .... Take one tablet daily 3)  Fish Oil 500 Mg Caps (Omega-3 fatty acids) .... 2 tabs daily 4)  Vytorin 10-20 Mg Tabs (Ezetimibe-simvastatin) .Marland Kitchen.. 1 by mouth qhs 5)  Asa 81mg  Qd  6)  Multivitamin  7)  Folic Acid  8)  Gabapentin 308 Mg Caps (Gabapentin) .Marland Kitchen.. 1 by mouth every 8 hours as needed for headaches 9)  Prilosec Otc 20 Mg Tbec (Omeprazole magnesium) .Marland Kitchen.. 1 tab once daily 10)  Citalopram Hydrobromide 20 Mg Tabs (Citalopram hydrobromide) .Marland Kitchen.. 1 once daily**office visit due now** 11)  Vitamin D3 1000 Unit Tabs (Cholecalciferol) .Marland Kitchen.. 1 by mouth once daily 12)  Cal-mag-vit-d (1200mg -600mg -100iu)  .... 2 by mouth at bedtime 13)  Cozaar 100 Mg Tabs (Losartan potassium) .Marland KitchenMarland KitchenMarland Kitchen  1 once daily 14)  Amlodipine Besylate 5 Mg Tabs (Amlodipine besylate) .Marland Kitchen.. 1 tablet two times per day 15)  Oxycodone-acetaminophen 5-325 Mg Tabs (Oxycodone-acetaminophen) .Marland Kitchen.. 1-2 by mouth every 4-6 hours as needed (started in the hospital) 16)  Ibuprofen 600 Mg Tabs (Ibuprofen) .Marland Kitchen.. 1 by mouth every 8 hours as needed (started in the hospital)  Patient  Instructions: 1)  Warm ,moist compresses to bruises three times a day as needed .

## 2011-03-06 ENCOUNTER — Other Ambulatory Visit: Payer: Self-pay | Admitting: Internal Medicine

## 2011-03-06 NOTE — Telephone Encounter (Signed)
repeat  fasting lipids,A1c , BUN ,creat, urine microalbumin  after 4 months . Drink to thirst, up to 40 oz of water / day. [facility]( 272.4, 790.29,995.20). [facility] Labs were due 01/2011

## 2011-03-26 ENCOUNTER — Encounter: Payer: Self-pay | Admitting: Internal Medicine

## 2011-03-26 ENCOUNTER — Ambulatory Visit (INDEPENDENT_AMBULATORY_CARE_PROVIDER_SITE_OTHER): Payer: Medicare Other | Admitting: Internal Medicine

## 2011-03-26 DIAGNOSIS — I10 Essential (primary) hypertension: Secondary | ICD-10-CM

## 2011-03-26 DIAGNOSIS — R609 Edema, unspecified: Secondary | ICD-10-CM

## 2011-03-26 NOTE — Progress Notes (Signed)
  Subjective:    Patient ID: Kylie Patrick, female    DOB: 08/30/1936, 75 y.o.   MRN: [id number]  HPI Edema Onset worse by :beginning of April; resolves after 2-3 hrs  with elevation Constitutional: no Fatigue;chronic (years)  sleep disturbance w/o apnea                                                   Cardiovascular: no  Palpitations; tachycardia; claudication; paroxysmal nocturnal dyspnea Pulmonary:no Cough; sputum reduction; intermittent  exertional dyspnea Endocrinologic:no Weight change;  skin/hair/nail changes. Some heat intolerance. Possible triggers: no Salt  Excess;no  new medications , but on Amlodipine    Review of Systems     Objective:   Physical Exam General appearance is one of good health and nourishment w/o distress.  Eyes: No conjunctival inflammation or scleral icterus is present. Neck: No NVD @ 15 degrees  Heart:  Normal rate and regular rhythm. S1 and S2 normal without gallop, murmur, click, rub or other extra sounds     Lungs:Chest clear to auscultation; no wheezes, rhonchi,rales ,or rubs present.No increased work of breathing.   Abdomen: bowel sounds normal, soft and non-tender without masses, organomegaly or hernias noted.  No guarding or rebound   Skin:Warm & dry.  Intact without suspicious lesions or rashes ; no jaundice or tenting  Musculoskeletal/Extremities: Nail health is good. . No cyanosis, clubbing or edema are present.  Trace edema; lipedema. Homan's sign . Marland Kitchen              Assessment & Plan:  #1 edema  #2 calcium channel blocker therapy with amlodipine  Plan: #1 stop amlodipine and monitor the edema. Monitor blood pressure; goal equals average less than 135/85.  #2 AST, ALT, BUN, creatinine and potassium. Clinically there is no indication to perform a BNP.

## 2011-03-26 NOTE — Patient Instructions (Signed)
Please monitor the blood pressure off amlodipine. Your goal is an average less than 135/85.

## 2011-03-27 LAB — BUN: BUN: 17 mg/dL (ref 6–23)

## 2011-03-27 LAB — POTASSIUM: Potassium: 4.1 mEq/L (ref 3.5–5.1)

## 2011-03-27 LAB — HEPATIC FUNCTION PANEL
AST: 24 U/L (ref 0–37)
Bilirubin, Direct: 0.1 mg/dL (ref 0.0–0.3)
Total Bilirubin: 0.8 mg/dL (ref 0.3–1.2)

## 2011-03-30 NOTE — H&P (Signed)
NAMESHENIECE, RUGGLES               ACCOUNT NO.:  [id number]   MEDICAL RECORD NO.:  [id number]          PATIENT TYPE:  INP   LOCATION:  0478                         FACILITY:  [facility]   PHYSICIAN:  Titus Dubin. Alwyn Ren, M.D. [facility] OF BIRTH:  1936/10/07   DATE OF ADMISSION:  01/04/2005  DATE OF DISCHARGE:                                HISTORY & PHYSICAL   Kylie Patrick is a 75 year old white female admitted with intractable nausea  and vomiting.  Her symptoms began January 03, 2004, at approximately 11  a.m. as a headache, which she described as slight.  She then developed  terrible-tasting burping with nausea and vomiting x3.  On February 22, she  vomited a total of seven times and has been n.p.o. since breakfast February  21.  The morning of the office visit, she vomited twice.   The symptoms were initially associated with a temperature up to 101.2 and  rigor and chills.  The fevers resolved, but she has continued to have some  rigor intermittently.  She has had a cough, which has been productive of  clear material.  She has had no rash; genitourinary symptoms are negative.  She has not had diarrhea or rectal bleeding.   PAST MEDICAL HISTORY:  1.  Hysterectomy and bilateral salpingo-oophorectomy for endometriosis and      fibroids.  There was no malignancy.  2.  She has had breast biopsies twice.  3.  She had cholecystectomy and appendectomy.  4.  Colonoscopy revealed polyps and hemorrhoids.  5.  She was hospitalized in 2000 with tonsillitis.   Medical problems include:  1.  Hypertension with associated LVH on 2 D echo.  2.  Hyperlipidemia.  3.  Hypokalemia with certain medications.   FAMILY HISTORY:  Her son died with disseminated lymphatic cancer apparently.  Mother had insulin-dependent diabetes and coronary artery disease and bypass  grafting.  Diabetes is found in maternal aunts, as is breast cancer and  stomach cancer.  Her father died at 51 of a myocardial infarction.   Paternal  grandfather had stroke.  Paternal grandfather had heart disease.  Paternal  grandmother had aneurysm.  Sister had bypass grafting at age 109.   She has never smoked.  She is on no definite exercise program.  Exercise has  been limited by knee pain.  She is on no specific diet but did buy the [facility] book.   REVIEW OF SYSTEMS:  Some discomfort in the left shoulder at times.  There is  numbness of the right upper extremity involving the hand at night on  occasion.   She is intolerant or allergic to HYDROCHLOROTHIAZIDE, which causes  hypokalemia; VIOXX, which elevated her blood pressure; EVISTA, which caused  flushing; and HYZAAR, which also lowered blood potassium.   She is presently on:  1.  Multivitamins.  2.  Benicar 40 mg.  3.  Toprol XL 200 mg one-half daily.  4.  Folic acid 400 mcg daily.  5.  Spironolactone 25 mg twice a day.  6.  Viactiv with D and K.  7.  Additionally, she was placed  on Crestor 10 mg in October 2005.   PHYSICAL EXAMINATION:  GENERAL:  She appears acutely ill, lying on an  examining table with a cool wrap over her forehead and an ice bag at her  neck.  She states, This is the sickest I've ever been.  VITAL SIGNS:  Temperature was 97.4, pulse was 99-105, respiratory rate 22,  blood pressure 98/70.  HEENT:  She had arteriolar narrowing.  There was no scleral icterus.  The  mouth was dry and the tongue erythematous.  NECK:  Supple.  CHEST:  Clear to auscultation.  CARDIAC:  A distant tachycardia was noted on auscultation of the heart.  ABDOMEN:  Soft with decreased bowel sounds.  LYMPHATIC:  She had no lymphadenopathy or organomegaly about the head, neck  or axilla.  NEUROLOGIC:  She was profoundly weak, had to be helped up for auscultation  of the chest.  Affect was flat but appropriate.  There were no  neuropsychiatric deficits.  SKIN:  The skin was dry and absent of jaundice.  There was dramatic tenting  of the skin.   She is now  admitted with intractable nausea and vomiting suggesting a  gastroenteritis versus a flu syndrome.  It is to be noted that she has not  had the flu shot.  She will be admitted for parenteral fluids and antinausea  control.  Antibiotic therapy will be initiated if it is clinically  appropriate based on pending lab studies.   Her antihypertensive medicines will be held because of the hypotension.      WFH/MEDQ  D:  01/05/2005  T:  01/05/2005  Job:  [id number]

## 2011-03-30 NOTE — Discharge Summary (Signed)
Kylie Patrick, Kylie Patrick               ACCOUNT NO.:  [id number]   MEDICAL RECORD NO.:  [id number]          PATIENT TYPE:  INP   LOCATION:  0478                         FACILITY:  [facility]   PHYSICIAN:  Wanda Plump, MD [facility]    DATE OF BIRTH:  22-May-1936   DATE OF ADMISSION:  01/04/2005  DATE OF DISCHARGE:  01/06/2005                                 DISCHARGE SUMMARY   ADMITTING DIAGNOSIS:  Gastroenteritis.   DISCHARGE DIAGNOSES:  1.  Gastroenteritis.  2.  Prerenal kidney failure.   BRIEF HISTORY AND PHYSICAL:  Kylie Patrick is a 75 year old white female, who  presented to Dr. Marga Melnick on January 04, 2005, with intractable  nausea and vomiting.  She was noted to have a temperature of 101.2.  At the  office, she was also noticed to be hypotensive with a blood pressure of  98/70, her respiratory rate was 22, pulse was 99 to 105, and she was  actually afebrile.  Chest was clear.  Abdomen showed decreased bowel sounds.  The decision was made to admit her to the hospital.   LABORATORY AND X-RAYS:  CBC showed platelets of 214, hemoglobin of 13, and  white count of 9.2.  Her initial BMP showed a sodium of 140, potassium of  4.0, BUN 34, and creatinine of 2.3.  At the time of discharge, the potassium  was 3.6, the BUN decreased from 34 to 24, and the creatinine was 1.7.  AST,  ALT, and alkaline phosphatase were normal.  Total bilirubin was just  slightly to 1.4.   HOSPITAL COURSE:  1.  The patient was admitted to the hospital with symptoms consistent with      acute gastroenteritis.  Her blood pressure medications was held.  She      was started on aggressive IV fluid replacement.  By the time of      discharge, she was feeling a lot better compared to admission and she      was able to tolerate almost all her meals.  She did have one spell of      nausea and vomiting at 2 a.m. before the discharge time, but at this      point I think she got maximal hospital benefit.  She will be  discharged      on Phenergan p.r.n., and she will also restart Toprol but will hold      Spironolactone and Benicar.  She did not have significant diarrhea while      she was here.   1.  Initially, the patient was hypotensive.  This has largely resolved.  As      stated above, we will hold some of her blood pressure medications.   1.  The instructions for discharge are as follows:      1.  Use Phenergan 25 mg p.o. or suppositories q.4h p.r.n. nausea.      2.  Continue with Crestor and Toprol as before.      3.  Do not use the Spironolactone or Benicar for the next two days.      4.  Continue drinking plenty of fluids as tolerated.      5.  Call Dr. Alwyn Ren to make an appointment for next week.  The          creatinine needs to be rechecked.      6.  Come back to the hospital if the symptoms come back.      JEP/MEDQ  D:  01/06/2005  T:  01/06/2005  Job:  [id number]

## 2011-04-11 ENCOUNTER — Other Ambulatory Visit: Payer: Self-pay | Admitting: Internal Medicine

## 2011-04-11 NOTE — Telephone Encounter (Signed)
Lipid/272.4/995.20 

## 2011-05-15 ENCOUNTER — Other Ambulatory Visit: Payer: Self-pay | Admitting: Internal Medicine

## 2011-05-21 ENCOUNTER — Telehealth: Payer: Self-pay | Admitting: Internal Medicine

## 2011-05-21 NOTE — Telephone Encounter (Signed)
Please make appointment and bring in your blood pressure diary and all medicines.

## 2011-05-21 NOTE — Telephone Encounter (Signed)
Pt called says that since stopping amlodipine that a 28 day average of BP has been around 179/88 w/ p:68 no current problems w/ swelling but bp elevated. Pls advise

## 2011-05-22 NOTE — Telephone Encounter (Signed)
Patient contacted and informed she needs OV, appointment scheduled for tomorrow

## 2011-05-23 ENCOUNTER — Encounter: Payer: Self-pay | Admitting: Internal Medicine

## 2011-05-23 ENCOUNTER — Ambulatory Visit (INDEPENDENT_AMBULATORY_CARE_PROVIDER_SITE_OTHER): Payer: Medicare Other | Admitting: Internal Medicine

## 2011-05-23 DIAGNOSIS — I10 Essential (primary) hypertension: Secondary | ICD-10-CM

## 2011-05-23 DIAGNOSIS — N182 Chronic kidney disease, stage 2 (mild): Secondary | ICD-10-CM

## 2011-05-23 DIAGNOSIS — R0609 Other forms of dyspnea: Secondary | ICD-10-CM

## 2011-05-23 DIAGNOSIS — R0989 Other specified symptoms and signs involving the circulatory and respiratory systems: Secondary | ICD-10-CM

## 2011-05-23 LAB — BUN: BUN: 18 mg/dL (ref 6–23)

## 2011-05-23 MED ORDER — CARVEDILOL 25 MG PO TABS: 25.0000 mg | ORAL_TABLET | Freq: Two times a day (BID) | ORAL | Status: DC

## 2011-05-23 NOTE — Progress Notes (Signed)
  Subjective:    Patient ID: Kylie Patrick, female    DOB: 06/07/1936, 75 y.o.   MRN: [id number]  HPI HYPERTENSION: When the amlodipine was stopped the swelling improved, but she has had poorly controlled blood pressure since. Disease Monitoring  Blood pressure range: 127/63- 204/98  Chest pain: no   Dyspnea: yes, especially with stairs ; no PNDyspnea  Claudication: no  Medication compliance: yes  Medication Side Effects  Lightheadedness: yes, occasionally   Urinary frequency: yes, as of past month   Edema: no, resolved after 2 days off CCB  Preventitive Healthcare:  Exercise: no   Diet Pattern: no plan  Salt Restriction: no      Review of Systems  She denies any cough or sputum production this time. She has no history of asthma. She's never smoked. She denies persistent fatigue.     Objective:   Physical Exam Gen.: Healthy and well-nourished in appearance. Alert, appropriate and cooperative throughout exam.  Eyes: No corneal or conjunctival inflammation noted.  Fundal exam is benign without hemorrhages, exudate, papilledema. Arteriolar narrowing. Neck: No deformities, masses, or tenderness noted.  Thyroid  Normal. No NVD Lungs: Normal respiratory effort; chest expands symmetrically. Lungs are clear to auscultation without rales, wheezes, or increased work of breathing. Heart: Normal rate and rhythm. Normal S1 and S2. No gallop, click, or rub. S4 with slurring ; no murmur. Abdomen: Bowel sounds normal; abdomen soft and nontender. No masses, organomegaly or hernias noted.No AAA or bruits                                                                                   Musculoskeletal/extremities:  No clubbing, cyanosis, edema, or deformity noted. Nail health  good. Vascular: Carotid, radial artery, dorsalis pedis and  posterior tibial pulses are full and equal. No bruits present. Neurologic: Alert and oriented x3. Skin: Intact without suspicious lesions or rashes. Lymph: No  cervical, axillary  lymphadenopathy present. Psych: Mood and affect are normal. Normally interactive                                                                                         Assessment & Plan:  #1 hypertension, poorly controlled  #2 edema resolved off amlodipine  #3 dyspnea on exertion times one month  #4 mild renal insufficiency, past medical history of  Plan: EKG to assess the dyspnea on exertion ( Note: EKG reviewed; no ischemic changes)   Check Renal function  Change the metoprolol to carvedilol and titrate for optimal blood pressure control

## 2011-05-23 NOTE — Patient Instructions (Signed)
Blood Pressure Goal  Ideally is an AVERAGE < 135/85. This AVERAGE should be calculated from @ least 5-7 BP readings taken @ different times of day on different days of week. You should not respond to isolated BP readings , but rather the AVERAGE for that week  

## 2011-06-06 ENCOUNTER — Other Ambulatory Visit: Payer: Self-pay | Admitting: Internal Medicine

## 2011-08-15 ENCOUNTER — Other Ambulatory Visit: Payer: Self-pay | Admitting: Internal Medicine

## 2011-09-08 ENCOUNTER — Other Ambulatory Visit: Payer: Self-pay | Admitting: Internal Medicine

## 2011-09-25 ENCOUNTER — Other Ambulatory Visit: Payer: Self-pay | Admitting: Internal Medicine

## 2011-09-25 NOTE — Telephone Encounter (Signed)
Lipid/Hep 272.4/995.20  

## 2011-10-08 ENCOUNTER — Ambulatory Visit (INDEPENDENT_AMBULATORY_CARE_PROVIDER_SITE_OTHER): Payer: Medicare Other | Admitting: Internal Medicine

## 2011-10-08 ENCOUNTER — Encounter: Payer: Self-pay | Admitting: Internal Medicine

## 2011-10-08 DIAGNOSIS — Z23 Encounter for immunization: Secondary | ICD-10-CM

## 2011-10-08 DIAGNOSIS — K219 Gastro-esophageal reflux disease without esophagitis: Secondary | ICD-10-CM

## 2011-10-08 DIAGNOSIS — Z0000 Encounter for general adult medical examination without abnormal findings: Secondary | ICD-10-CM

## 2011-10-08 DIAGNOSIS — E782 Mixed hyperlipidemia: Secondary | ICD-10-CM

## 2011-10-08 DIAGNOSIS — E8881 Metabolic syndrome: Secondary | ICD-10-CM

## 2011-10-08 DIAGNOSIS — R7309 Other abnormal glucose: Secondary | ICD-10-CM

## 2011-10-08 DIAGNOSIS — I10 Essential (primary) hypertension: Secondary | ICD-10-CM

## 2011-10-08 LAB — CBC WITH DIFFERENTIAL/PLATELET
Basophils Absolute: 0 10*3/uL (ref 0.0–0.1)
Eosinophils Absolute: 0.3 10*3/uL (ref 0.0–0.7)
HCT: 37.8 % (ref 36.0–46.0)
Hemoglobin: 12.2 g/dL (ref 12.0–15.0)
Lymphs Abs: 1.9 10*3/uL (ref 0.7–4.0)
MCHC: 32.2 g/dL (ref 30.0–36.0)
MCV: 83.6 fl (ref 78.0–100.0)
Monocytes Absolute: 0.6 10*3/uL (ref 0.1–1.0)
Neutro Abs: 4.7 10*3/uL (ref 1.4–7.7)
RDW: 16.1 % — ABNORMAL HIGH (ref 11.5–14.6)

## 2011-10-08 LAB — BASIC METABOLIC PANEL
CO2: 28 mEq/L (ref 19–32)
Calcium: 9.1 mg/dL (ref 8.4–10.5)
Glucose, Bld: 92 mg/dL (ref 70–99)
Sodium: 143 mEq/L (ref 135–145)

## 2011-10-08 LAB — TSH: TSH: 1.49 u[IU]/mL (ref 0.35–5.50)

## 2011-10-08 LAB — HEPATIC FUNCTION PANEL
ALT: 15 U/L (ref 0–35)
AST: 18 U/L (ref 0–37)
Bilirubin, Direct: 0 mg/dL (ref 0.0–0.3)
Total Bilirubin: 0.5 mg/dL (ref 0.3–1.2)
Total Protein: 7 g/dL (ref 6.0–8.3)

## 2011-10-08 NOTE — Progress Notes (Signed)
Subjective:    Patient ID: Kylie Patrick, female    DOB: June 25, 1936, 75 y.o.   MRN: [id number]  HPI Medicare Wellness Visit:  The following psychosocial & medical history were reviewed as required by Medicare.   Social history: caffeine: none , alcohol:  no ,  tobacco use : never  & exercise : minimally.   Home & personal  safety / fall risk: no issues, activities of daily living: no limitations , seatbelt use : yes , and smoke alarm employment : yes .  Power of Attorney/Living Will status : Living Will needed  Vision ( as recorded per Nurse) & Hearing  evaluation :  Last Ophth exam < 12 months ago  for cataract monitor. Wall chart read & whisper heard @ 6 ft. Orientation :oriented X 3 , memory & recall :normal , spelling or math testing: good,and mood & affect : normal . Depression / anxiety: no issues Travel history :never , immunization status :Shingles needed , transfusion history:  no, and preventive health surveillance ( colonoscopies, BMD , etc as per protocol/ [facility]): colonoscopy ? due this year or in 2013, Dental care:  Every 6 months . Chart reviewed &  Updated. Active issues reviewed & addressed.       Review of Systems HYPERTENSION; Disease Monitoring: Blood pressure range-< 140/90   Chest pain, palpitations- no      Dyspnea- no Medications: Compliance-  she has been taking Toprol as well as carvedilol, but only 1 of each  Lightheadedness,Syncope- no    Edema- no  FASTING HYPERGLYCEMIA: Polyuria/phagia/dipsia- no      Visual problems- no  HYPERLIPIDEMIA: Disease Monitoring: See symptoms for Hypertension Medications: Compliance- yes  Abd pain, bowel changes- no; she has had  intermittent burping since 11/15. There appears to be some positionally to this . She specifically denies exertional chest pain or dyspnea. Her last EKG was in July.  Muscle aches- no          Objective:   Physical Exam Gen.: Healthy and well-nourished in appearance. Alert, appropriate and  cooperative throughout exam. Head: Normocephalic without obvious abnormalities Eyes: No corneal or conjunctival inflammation noted. Pupils equal round reactive to light and accommodation. Extraocular motion intact.  Ears: External  ear exam reveals no significant lesions or deformities. Canals clear .TMs normal.  Nose: External nasal exam reveals no deformity or inflammation. Nasal mucosa are pink and moist. No lesions or exudates noted.  Mouth: Oral mucosa and oropharynx reveal no lesions or exudates. Teeth in good repair. Neck: No deformities, masses, or tenderness noted. Range of motion & . Thyroid normal. Lungs: Normal respiratory effort; chest expands symmetrically. Lungs are clear to auscultation without rales, wheezes, or increased work of breathing. Heart: slow  rate and regular  rhythm. Normal S1 and S2. No gallop, click, or rub. No murmur.Heart sounds distant Abdomen: Bowel sounds normal; abdomen soft and nontender. No masses, organomegaly or hernias noted. Genitalia: Dr Ambrose Mantle, [place] [facility]   .                                                                                   Musculoskeletal/extremities: Lordosis noted of  the thoracic  spine. No  clubbing, cyanosis, edema, or deformity noted. Range of motion  normal .Tone & strength  normal.Joints normal. Nail health  good. Vascular: Carotid, radial artery, dorsalis pedis and  posterior tibial pulses are full and equal. No bruits present. Neurologic: Alert and oriented x3. Deep tendon reflexes symmetrical and normal.          Skin: Intact without suspicious lesions or rashes. Lymph: No cervical, axillary  lymphadenopathy present. Psych: Mood and affect are normal. Normally interactive                                                                                         Assessment & Plan:  #1 Medicare Wellness Exam; criteria met ; data entered #2 Problem List reviewed / Recommendations made   #3  exacerbation of burping despite  Prilosec; Prilosec will be recommended twice a day before breakfast and evening meal until symptoms resolve. Additional workup if symptoms persist.  #4 duplicative  beta blocker therapy; Toprol will be discontinued Plan: see Orders

## 2011-10-08 NOTE — Patient Instructions (Addendum)
Preventive Health Care: Exercise  30-45  minutes a day, 3-4 days a week. Walking is especially valuable in preventing Osteoporosis. Eat a low-fat diet with lots of fruits and vegetables, up to 7-9 servings per day. Avoid obesity; your goal = waist less than 35 inches.Consume less than 30 grams of sugar per day from foods & drinks with High Fructose Corn Syrup as # 1,2,3 or #4 on label. A Living Will place you in charge of your health care  decisions. Verify this is  in place. Take carvedilol twice a day & STOP Toprol. Blood Pressure Goal  Ideally is an AVERAGE < 135/85. This AVERAGE should be calculated from @ least 5-7 BP readings taken @ different times of day on different days of week. You should not respond to isolated BP readings , but rather the AVERAGE for that week.  The triggers for dyspepsia or "heart burn"  include stress; the "aspirin family" ; alcohol; peppermint; and caffeine (coffee, tea, cola, and chocolate). The aspirin family would include aspirin and the nonsteroidal agents such as ibuprofen &  Naproxen. Tylenol would not cause reflux. If having dyspepsia ; food & drink should be avoided for @ least 2 hours before going to bed.  Take Prilosec before & eve meal for up to 4 weeks..If symptoms persist or progress despite present treatment , please see me

## 2011-10-29 ENCOUNTER — Other Ambulatory Visit: Payer: Self-pay | Admitting: Internal Medicine

## 2011-11-22 ENCOUNTER — Other Ambulatory Visit: Payer: Self-pay | Admitting: Internal Medicine

## 2012-01-22 ENCOUNTER — Encounter: Payer: Self-pay | Admitting: Internal Medicine

## 2012-01-22 ENCOUNTER — Ambulatory Visit (INDEPENDENT_AMBULATORY_CARE_PROVIDER_SITE_OTHER): Payer: Medicare Other | Admitting: Internal Medicine

## 2012-01-22 VITALS — BP 138/100 | HR 76 | Temp 97.8°F | Wt 199.0 lb

## 2012-01-22 DIAGNOSIS — M5430 Sciatica, unspecified side: Secondary | ICD-10-CM

## 2012-01-22 DIAGNOSIS — I10 Essential (primary) hypertension: Secondary | ICD-10-CM

## 2012-01-22 MED ORDER — GABAPENTIN 100 MG PO CAPS: ORAL_CAPSULE | ORAL | Status: DC

## 2012-01-22 MED ORDER — CHLORTHALIDONE 25 MG PO TABS: 25.0000 mg | ORAL_TABLET | Freq: Every day | ORAL | Status: DC

## 2012-01-22 NOTE — Patient Instructions (Signed)
Blood Pressure Goal  Ideally is an AVERAGE < 135/85. This AVERAGE should be calculated from @ least 5-7 BP readings taken @ different times of day on different days of week. You should not respond to isolated BP readings , but rather the AVERAGE for that week Please bring your  blood pressure cuff to office visits to verify that it is reliable.It  can also be checked against the blood pressure device at the pharmacy. Finger or wrist cuffs are not dependable; an arm cuff is  .make appt in 2 weeks

## 2012-01-22 NOTE — Progress Notes (Signed)
  Subjective:    Patient ID: Kylie Patrick, female    DOB: 1936/09/27, 76 y.o.   MRN: [id number]  HPI #1 HYPERTENSION, uncontrolled:  Blood pressure range: Average 155/106 (115/78 - 214/117)              Chest pain: None  Dyspnea: None  Claudication:  None  Medication compliance:  Yes Medication Side Effects  Lightheadedness: None  Urinary frequency:  None  Edema:  None  Preventitive Healthcare:  Exercise: None since onset of leg pain  Diet Pattern: No specific diet  Salt Restriction: No salt added  #2 BUTTOCK PAIN: Location: Right inferior buttock, radiating to right ankle Onset: January Trigger/injury: None Pain quality: Stabbing pain Pain severity: Up to 8/10 Duration: Up to 10 minutes Radiation: To right ankle Exacerbating factors: Standing for awhile or walking Treatment/response: Taking tylenol and Alieve with no relief; better sitting         Review of Systems Constitutional: no fever, chills, sweats, change in weight  Musculoskeletal:no  muscle cramps or pain; no  joint stiffness, redness, or swelling Skin:no rash, color change Neuro: no :weakness; incontinence (stool/urine); numbness and tingling Heme:no lymphadenopathy; abnormal bruising or bleeding      Objective:   Physical Exam Gen.: Healthy and well-nourished in appearance. Alert, appropriate and cooperative throughout exam. Head: Normocephalic without obvious abnormalities  Eyes: No corneal or conjunctival inflammation noted.  Neck: No deformities, masses, or tenderness noted. Range of motion normal. Lungs: Normal respiratory effort; chest expands symmetrically. Lungs are clear to auscultation without rales, wheezes, or increased work of breathing. Heart: Normal rate and rhythm. Normal S1 and S2. No gallop, click, or rub. No murmur. Abdomen: Bowel sounds normal; abdomen soft and nontender. No masses, organomegaly or hernias noted.                                                     Musculoskeletal/extremities: Thoracic lordosis noted. No clubbing, cyanosis, edema, or deformity noted. Range of motion  normal; negative straight leg raising to 90 .Tone & strength  normal.Joints normal. Nail health  good. She is able to lie back and sit up without help Vascular: Carotid, radial artery, dorsalis pedis and  posterior tibial pulses are full and equal. No bruits present. Neurologic: Alert and oriented x3. Deep tendon reflexes symmetrical and normal. Gait is normal to include heel and toe walking.        Skin: Intact without suspicious lesions or rashes. Lymph: No cervical, axillary lymphadenopathy present. Psych: Mood and affect are normal. Normally interactive                                                                                         Assessment & Plan:  #1 hypertension, suboptimal control  #2 sciatica right lower extremity; no neuro muscular deficit present  Plan: See orders and recommendations

## 2012-02-05 ENCOUNTER — Ambulatory Visit (INDEPENDENT_AMBULATORY_CARE_PROVIDER_SITE_OTHER): Payer: Medicare Other | Admitting: Internal Medicine

## 2012-02-05 ENCOUNTER — Encounter: Payer: Self-pay | Admitting: Internal Medicine

## 2012-02-05 VITALS — BP 122/86 | HR 72 | Temp 98.2°F | Wt 199.6 lb

## 2012-02-05 DIAGNOSIS — I10 Essential (primary) hypertension: Secondary | ICD-10-CM

## 2012-02-05 NOTE — Progress Notes (Signed)
  Subjective:    Patient ID: Kylie Patrick, female    DOB: 10/02/1936, 76 y.o.   MRN: [id number]  HPI  HYPERTENSION F/U: Disease Monitoring  Blood pressure range:113/68-156/96; Pattern appears to be improvement in BPs. Her average 140/79. Note:; her BP cuff read 153/81; ours 122/86 today  Chest pain:no  Dyspnea: no  Claudication: no  Medication compliance: no Medication Side Effects  Lightheadedness: single episode 2 am going to BR  Urinary frequency:  Not beyond what's expected with fluid intake  Edema: no     Preventitive Healthcare:  Exercise:no  Diet Pattern:low fat  Salt Restriction:yes      Review of Systems     Objective:   Physical Exam She appears  well-nourished;s he is in no acute distress  No carotid bruits are present.  Heart rhythm and rate are normal with no significant murmurs or gallops. S 4  Chest is clear with no increased work of breathing  There is no evidence of aortic aneurysm or renal artery bruits  She has no clubbing or edema.   Pedal pulses are intact   No ischemic skin changes are present         Assessment & Plan:

## 2012-02-05 NOTE — Patient Instructions (Signed)
Blood Pressure Goal MINIMALLY is average < 140/90.  Ideal is an AVERAGE < 135/85. This AVERAGE should be calculated from @ least 5-7 BP readings taken @ different times of day on different days of week. You should not respond to isolated BP readings , but rather the AVERAGE for that week . Repeat the isometric exercises discussed 4- 5 times prior to standing if you've been seated for a period of time.

## 2012-02-05 NOTE — Assessment & Plan Note (Signed)
Home readings may be artificially high based on direct comparison of her cuff and ours. She should check her cuff against that at the drugstore periodically. Unfortunately she may need a new cuff.  The blood pressure recordings exhibit a pattern of improvement. She's had some significant low blood pressures and one episode of postural hypotension.  No change will be made in medication. She should perform isometrics prior to standing at night or after being seated for a period of time.

## 2012-02-11 ENCOUNTER — Other Ambulatory Visit: Payer: Self-pay | Admitting: Internal Medicine

## 2012-02-11 NOTE — Telephone Encounter (Signed)
Refill done.  

## 2012-05-19 ENCOUNTER — Telehealth: Payer: Self-pay | Admitting: Internal Medicine

## 2012-05-19 DIAGNOSIS — I10 Essential (primary) hypertension: Secondary | ICD-10-CM

## 2012-05-19 MED ORDER — LOSARTAN POTASSIUM 100 MG PO TABS: ORAL_TABLET | ORAL | Status: DC

## 2012-05-19 MED ORDER — CHLORTHALIDONE 25 MG PO TABS: 25.0000 mg | ORAL_TABLET | Freq: Every day | ORAL | Status: DC

## 2012-05-19 NOTE — Telephone Encounter (Signed)
Refill: Chlorthalidone 25mg  tablet. 90 day supply Prilosec otc 20mg  tablet. 90 day supply Losartan potassium 100mg  tab. 90 day supply

## 2012-05-19 NOTE — Telephone Encounter (Signed)
The QT interval  is the " recovery phase" after a heart beat. If that interval is prolonged ( > 460 milliseconds for women or > 440 msec for men ) ; there is increased risk of abnormal heart rate &/or   rhythm which could be associated with health or life threatening issues. This can be caused by genetics; but also certain medications such as citalopram especially in combination with omeprazole may cause an acquired prolongation of the QT interval.   I recommend changing the omeprazole to ranitidine 150 mg every 12 hours as needed or change in the citalopram to one of the following agents which   are not associated with this phenomenon :Paxil, Sertraline , Prozac , & Venlefexine

## 2012-05-19 NOTE — Telephone Encounter (Signed)
Dr.Hopper please advise on refill request for Prilosec, the following message populated:  Plasma concentrations and toxic effects of citalopram may be increased by concomitant administration of omeprazole. Specifically, citalopram doses greater than 20 mg/day are not recommended in patients receiving omeprazole according to official package labeling due to the risk of QT prolongation.   **Other medications filled

## 2012-05-21 ENCOUNTER — Other Ambulatory Visit: Payer: Self-pay | Admitting: *Deleted

## 2012-05-21 MED ORDER — RANITIDINE HCL 150 MG PO TABS: 150.0000 mg | ORAL_TABLET | Freq: Two times a day (BID) | ORAL | Status: DC

## 2012-05-21 MED ORDER — OMEPRAZOLE MAGNESIUM 20 MG PO TBEC: DELAYED_RELEASE_TABLET | ORAL | Status: DC

## 2012-05-21 NOTE — Telephone Encounter (Signed)
Done/SLS 

## 2012-05-21 NOTE — Telephone Encounter (Signed)
Spoke with patient, omeprazole was changed to ranitidine. RX sent to pharmacy

## 2012-07-07 ENCOUNTER — Other Ambulatory Visit: Payer: Self-pay | Admitting: Internal Medicine

## 2012-07-11 ENCOUNTER — Telehealth: Payer: Self-pay | Admitting: *Deleted

## 2012-07-11 MED ORDER — PANTOPRAZOLE SODIUM 40 MG PO TBEC: 40.0000 mg | DELAYED_RELEASE_TABLET | Freq: Every day | ORAL | Status: DC

## 2012-07-11 NOTE — Telephone Encounter (Signed)
Discuss with patient, Rx sent. 

## 2012-07-11 NOTE — Telephone Encounter (Signed)
Pt changed from omeprazole to zantac due to med interaction with celexa. Pt indicated that since the change it seem as if the acid reflux has gotten worse. Pt would like to know if there is another med that she can take in the place of this one to help with symptoms. .Please advise

## 2012-07-11 NOTE — Telephone Encounter (Signed)
protonix 40 mg #30  1 po qd ---11 refills 

## 2012-08-11 ENCOUNTER — Other Ambulatory Visit: Payer: Self-pay | Admitting: Internal Medicine

## 2012-08-18 ENCOUNTER — Telehealth: Payer: Self-pay | Admitting: Internal Medicine

## 2012-08-18 NOTE — Telephone Encounter (Signed)
I am sorry but specialists & insurance organizations  require an updated, current  Assessment (last assessment 3/13) and written note from the Primary Care physician  to review before they  schedule an appointment to assess symptoms or problems. If we do not have such  a current  assessment of your health issue or complaint in the chart (electronic medical record);you will need to  make an appointment to create this document THEY REQUIRE. It will be necessary to know prior evaluations and treatments of this symptom and response to these interventions. Please bring that medical history & all medications & supplements to that appointment so I can complete the required document.

## 2012-08-18 NOTE — Telephone Encounter (Signed)
Pt called stated she would like to get a referral for her sciatic nerve if possible. She was last seen forthis condition 3.12.13 Cb# [phone]

## 2012-08-19 NOTE — Telephone Encounter (Signed)
Spoke w/Kylie Patrick. She states after she called her pain went away. I advised Kylie Patrick that if she thinks she still needs the referral, Dr. Alwyn Ren would need to see her first to complete an updated assessment. Kylie Patrick understood and will call for appt if pain persists.

## 2012-08-20 ENCOUNTER — Other Ambulatory Visit: Payer: Self-pay | Admitting: Internal Medicine

## 2012-10-06 ENCOUNTER — Encounter: Payer: Self-pay | Admitting: Internal Medicine

## 2012-10-24 ENCOUNTER — Other Ambulatory Visit: Payer: Self-pay | Admitting: Internal Medicine

## 2012-11-19 ENCOUNTER — Other Ambulatory Visit: Payer: Self-pay | Admitting: Internal Medicine

## 2012-11-24 ENCOUNTER — Other Ambulatory Visit: Payer: Self-pay | Admitting: Internal Medicine

## 2012-11-24 DIAGNOSIS — T887XXA Unspecified adverse effect of drug or medicament, initial encounter: Secondary | ICD-10-CM

## 2012-11-24 DIAGNOSIS — E785 Hyperlipidemia, unspecified: Secondary | ICD-10-CM

## 2012-12-23 ENCOUNTER — Other Ambulatory Visit: Payer: Self-pay | Admitting: Internal Medicine

## 2013-01-03 ENCOUNTER — Other Ambulatory Visit: Payer: Self-pay | Admitting: Internal Medicine

## 2013-01-08 ENCOUNTER — Encounter: Payer: Self-pay | Admitting: Lab

## 2013-01-09 ENCOUNTER — Encounter: Payer: Self-pay | Admitting: Internal Medicine

## 2013-01-09 ENCOUNTER — Ambulatory Visit (INDEPENDENT_AMBULATORY_CARE_PROVIDER_SITE_OTHER): Payer: Medicare Other | Admitting: Internal Medicine

## 2013-01-09 VITALS — BP 128/76 | HR 98 | Temp 98.0°F | Resp 12 | Ht 60.03 in | Wt 201.0 lb

## 2013-01-09 DIAGNOSIS — E782 Mixed hyperlipidemia: Secondary | ICD-10-CM

## 2013-01-09 DIAGNOSIS — R1319 Other dysphagia: Secondary | ICD-10-CM

## 2013-01-09 DIAGNOSIS — Z23 Encounter for immunization: Secondary | ICD-10-CM

## 2013-01-09 DIAGNOSIS — Z860100 Personal history of colon polyps, unspecified: Secondary | ICD-10-CM | POA: Insufficient documentation

## 2013-01-09 DIAGNOSIS — R112 Nausea with vomiting, unspecified: Secondary | ICD-10-CM

## 2013-01-09 DIAGNOSIS — I10 Essential (primary) hypertension: Secondary | ICD-10-CM

## 2013-01-09 DIAGNOSIS — Z0000 Encounter for general adult medical examination without abnormal findings: Secondary | ICD-10-CM

## 2013-01-09 DIAGNOSIS — R9431 Abnormal electrocardiogram [ECG] [EKG]: Secondary | ICD-10-CM

## 2013-01-09 DIAGNOSIS — N189 Chronic kidney disease, unspecified: Secondary | ICD-10-CM

## 2013-01-09 LAB — LIPID PANEL
HDL: 42.4 mg/dL (ref >39.00)
Total CHOL/HDL Ratio: 3
VLDL: 29.4 mg/dL (ref 0.0–40.0)

## 2013-01-09 LAB — CBC WITH DIFFERENTIAL/PLATELET
Basophils Absolute: 0.1 10*3/uL (ref 0.0–0.1)
Basophils Relative: 0.8 % (ref 0.0–3.0)
Eosinophils Absolute: 0.3 10*3/uL (ref 0.0–0.7)
Lymphocytes Relative: 25.9 % (ref 12.0–46.0)
MCHC: 33.6 g/dL (ref 30.0–36.0)
MCV: 86.3 fl (ref 78.0–100.0)
Neutro Abs: 3.5 10*3/uL (ref 1.4–7.7)
Neutrophils Relative %: 57.3 % (ref 43.0–77.0)
Platelets: 202 10*3/uL (ref 150.0–400.0)
RBC: 4.09 Mil/uL (ref 3.87–5.11)

## 2013-01-09 LAB — BASIC METABOLIC PANEL
BUN: 25 mg/dL — ABNORMAL HIGH (ref 6–23)
Chloride: 103 mEq/L (ref 96–112)
GFR: 23.03 mL/min — ABNORMAL LOW (ref >60.00)
Glucose, Bld: 87 mg/dL (ref 70–99)
Potassium: 3.5 mEq/L (ref 3.5–5.1)
Sodium: 141 mEq/L (ref 135–145)

## 2013-01-09 LAB — HEPATIC FUNCTION PANEL
Alkaline Phosphatase: 53 U/L (ref 39–117)
Bilirubin, Direct: 0.1 mg/dL (ref 0.0–0.3)
Total Protein: 6.5 g/dL (ref 6.0–8.3)

## 2013-01-09 LAB — TSH: TSH: 1.55 u[IU]/mL (ref 0.35–5.50)

## 2013-01-09 MED ORDER — CITALOPRAM HYDROBROMIDE 20 MG PO TABS: ORAL_TABLET | ORAL | Status: DC

## 2013-01-09 MED ORDER — PANTOPRAZOLE SODIUM 40 MG PO TBEC: 40.0000 mg | DELAYED_RELEASE_TABLET | Freq: Every day | ORAL | Status: DC

## 2013-01-09 MED ORDER — CHLORTHALIDONE 25 MG PO TABS: ORAL_TABLET | ORAL | Status: DC

## 2013-01-09 MED ORDER — EZETIMIBE-SIMVASTATIN 10-20 MG PO TABS: ORAL_TABLET | ORAL | Status: DC

## 2013-01-09 MED ORDER — CARVEDILOL 25 MG PO TABS: ORAL_TABLET | ORAL | Status: DC

## 2013-01-09 MED ORDER — LOSARTAN POTASSIUM 100 MG PO TABS: ORAL_TABLET | ORAL | Status: DC

## 2013-01-09 NOTE — Progress Notes (Signed)
Subjective:    Patient ID: Kylie Patrick, female    DOB: 03/29/1936, 77 y.o.   MRN: [id number]  HPI Medicare Wellness Visit:  Psychosocial & medical history were reviewed as required by Medicare (abuse,antisocial behavioral risks,firearm risk).  Social history: caffeine: all decaf coffee . A few colas/ week.Alcohol: very rarely  ,  tobacco use: never   Exercise : not in Winter   No home & personal  safety / fall risk Activities of daily living: no limitations  Seatbelt  and smoke alarm employed. Power of Attorney/Living Will status : no Living Will Ophthalmology exam current Hearing evaluation not current Orientation :oriented X 3  Memory & recall :good Spelling or math testing:good Mood & affect : normal . Depression / anxiety: denied Travel history :   never  Immunization status : Shingles given Transfusion history:  none  Preventive health surveillance ( colonoscopy, BMD , mammograms,PAP as per protocol/ SOC): all not  current but S/P TAH & BSO , no  PAP needed Dental care:  Every 6 mos. Chart reviewed &  Updated. Active issues reviewed & addressed.      Review of Systems  She describes intermittent nausea and vomiting after eating over the past 6 months, up to 3-4X/ week. She questions whether decaffeinated coffee was a trigger and has decreased from 5 cups a day to 2 cups a day. Also lettuce may be a trigger. She has occasional dysphagia  She denies  unexplained weight loss, abdominal pain, melena, rectal bleeding  She did have colon polyps in 2003; she received a recall notice last year it is not followed up this recommendation.    Objective:   Physical Exam Gen.:  well-nourished in appearance. Alert, appropriate and cooperative throughout exam. Head: Normocephalic without obvious abnormalities  Eyes: No corneal or conjunctival inflammation noted. Vision grossly normal without lenses Ears: External  ear exam reveals no significant lesions or deformities. Canals clear  .TMs normal. Hearing is grossly normal bilaterally. Nose: External nasal exam reveals no deformity or inflammation. Nasal mucosa are pink and moist. No lesions or exudates noted.   Mouth: Oral mucosa and oropharynx reveal no lesions or exudates. Teeth in good repair. Neck: No deformities, masses, or tenderness noted. Range of motion & Thyroid normal Lungs: Normal respiratory effort; chest expands symmetrically. Lungs are clear to auscultation without rales, wheezes, or increased work of breathing. Heart: Normal rate and rhythm. Normal S1 and S2. No gallop, click, or rub. Distant heart sounds. Abdomen: Bowel sounds normal; abdomen soft and nontender. No masses, organomegaly or hernias noted. Genitalia: S/P TAH & BSO                             Musculoskeletal/extremities: Accentuated curvature of upper thoracic spine. No clubbing, cyanosis, edema, or significant extremity  deformity noted. Range of motion normal .Tone & strength  Normal. Varus knee changes. Joints normal / reveal mild  DJD DIP changes. Nail health good. Able to lie down & sit up w/o help. Negative SLR bilaterally Vascular: Carotid, radial artery, dorsalis pedis and  posterior tibial pulses are full and equal. No bruits present. Neurologic: Alert and oriented x3. Deep tendon reflexes symmetrical and normal.        Skin: Intact without suspicious lesions or rashes. Lipomata upper back Lymph: No cervical, axillary lymphadenopathy present. Psych: Mood and affect are normal. Normally interactive  Assessment & Plan:  #1 Medicare Wellness Exam; criteria met ; data entered #2 Problem List reviewed ; Assessment/ Recommendations made #3 nausea & vomiting post prandially & intermittent dysphagia #4 PMH colon polyps Plan: see Orders

## 2013-01-09 NOTE — Telephone Encounter (Signed)
Error

## 2013-01-09 NOTE — Patient Instructions (Addendum)
Preventive Health Care: Exercise  30-45  minutes a day, 3-4 days a week. Walking is especially valuable in preventing Osteoporosis. Eat a low-fat diet with lots of fruits and vegetables, up to 7-9 servings per day. This would eliminate need for vitamin supplements for most individuals. Consume less than 30 grams of sugar per day from foods & drinks with High Fructose Corn Syrup as # 1,2,3 or #4 on label. Review and correct the record as indicated. Please share record with all medical staff seen.

## 2013-01-10 NOTE — Addendum Note (Signed)
Addended byMarga Melnick F on: 01/10/2013 12:24 PM   Modules accepted: Orders, Medications

## 2013-01-24 ENCOUNTER — Other Ambulatory Visit: Payer: Self-pay | Admitting: Internal Medicine

## 2013-02-04 ENCOUNTER — Other Ambulatory Visit (INDEPENDENT_AMBULATORY_CARE_PROVIDER_SITE_OTHER): Payer: 59

## 2013-02-04 ENCOUNTER — Encounter: Payer: Self-pay | Admitting: Internal Medicine

## 2013-02-04 ENCOUNTER — Ambulatory Visit (INDEPENDENT_AMBULATORY_CARE_PROVIDER_SITE_OTHER): Payer: 59 | Admitting: Internal Medicine

## 2013-02-04 VITALS — BP 110/72 | HR 60 | Ht 60.0 in | Wt 202.8 lb

## 2013-02-04 DIAGNOSIS — N189 Chronic kidney disease, unspecified: Secondary | ICD-10-CM

## 2013-02-04 DIAGNOSIS — R1310 Dysphagia, unspecified: Secondary | ICD-10-CM

## 2013-02-04 DIAGNOSIS — K219 Gastro-esophageal reflux disease without esophagitis: Secondary | ICD-10-CM

## 2013-02-04 DIAGNOSIS — N289 Disorder of kidney and ureter, unspecified: Secondary | ICD-10-CM

## 2013-02-04 DIAGNOSIS — Z1211 Encounter for screening for malignant neoplasm of colon: Secondary | ICD-10-CM

## 2013-02-04 LAB — BASIC METABOLIC PANEL
CO2: 30 mEq/L (ref 19–32)
Calcium: 9.4 mg/dL (ref 8.4–10.5)
Chloride: 106 mEq/L (ref 96–112)
Creatinine, Ser: 1.5 mg/dL — ABNORMAL HIGH (ref 0.4–1.2)
Sodium: 142 mEq/L (ref 135–145)

## 2013-02-04 NOTE — Patient Instructions (Addendum)
You have been scheduled for an endoscopy with propofol. Please follow written instructions given to you at your visit today. If you use inhalers (even only as needed), please bring them with you on the day of your procedure.  Your physician has requested that you go to the basement for the following lab work before leaving today: BMET  Contact Dr. Alwyn Ren regarding the status of your nephrology consult.  Thank you for choosing me and [facility].  Iva Boop, M.D., [facility]

## 2013-02-04 NOTE — Progress Notes (Signed)
  Subjective:  Pecola Lawless, MD [phone] [address] [facility] [address] [place], [place] [address]   Patient ID: Kylie Patrick, female    DOB: [birth year], 77 y.o.   MRN: [id number]  HPI This very nice lady is known from a prior screening colonoscopy in 2003. She is here for 3-4 months of occasional solid food dysphagia, and more frequent regurgitation and vomiting. She had Prilosec changed to ranitidine late summer 2013, due to concerns about a possible interaction between that and Celexa. The ranitidine was not helpful so went to pantoprazole. I think she was ok for while and then started with more sxs as above. No weight loss. Increased eructation. Sometimes has sleep disruption from the sxs.  All other GI ROS negative  Medications, allergies, past medical history, past surgical history, family history and social history are reviewed and updated in the EMR.  Review of Systems Chronic night sweats, recent worsening of creatinine - chlorthalidone stopped, waiting on a nephrology appointmentall other ROS negative or as per HPI    Objective:   Physical Exam General:  Well-developed, well-nourished and in no acute distress Eyes:  anicteric. ENT:   Mouth and posterior pharynx free of lesions.  Neck:   supple w/o thyromegaly or mass.  Lungs: Clear to auscultation bilaterally. Heart:  S1S2, no rubs, murmurs, gallops. Abdomen:  obese, soft, non-tender, no hepatosplenomegaly, hernia, or mass and BS+. RUQ chole scar Lymph:  no cervical or supraclavicular adenopathy. Extremities:   no edema Skin   no rash. Neuro:  A&O x 3.  Psych:  appropriate mood and  Affect.   Data Reviewed:  PCP note 2003 colonoscopy   Chemistry      Component Value Date/Time   NA 141 01/09/2013 1433   K 3.5 01/09/2013 1433   CL 103 01/09/2013 1433   CO2 29 01/09/2013 1433   BUN 25* 01/09/2013 1433   CREATININE 2.2* 01/09/2013 1433      Component Value Date/Time   CALCIUM 8.9 01/09/2013 1433   ALKPHOS 53  01/09/2013 1433   AST 22 01/09/2013 1433   ALT 17 01/09/2013 1433   BILITOT 0.7 01/09/2013 1433     Lab Results  Component Value Date   WBC 6.1 01/09/2013   HGB 11.9* 01/09/2013   HCT 35.3* 01/09/2013   MCV 86.3 01/09/2013   PLT 202.0 01/09/2013      Assessment & Plan:  Dysphagia, unspecified  GERD (gastroesophageal reflux disease)  Special screening for malignant neoplasms, colon  Acute on chronic renal insufficiency  1. EGD to evaluate dysphagia and GERD despite PPI The risks and benefits as well as alternatives of endoscopic procedure(s) have been discussed and reviewed. All questions answered. The patient agrees to proceed. BMET to follow-up renal fx and she will call PCp about nephrlogy visit Defer colonoscopy until we sort out active GI problems     Chemistry      Component Value Date/Time   NA 142 02/04/2013 0956   K 4.1 02/04/2013 0956   CL 106 02/04/2013 0956   CO2 30 02/04/2013 0956   BUN 16 02/04/2013 0956   CREATININE 1.5* 02/04/2013 0956       Cc:Marga Melnick, MD

## 2013-02-05 ENCOUNTER — Encounter: Payer: Self-pay | Admitting: Internal Medicine

## 2013-02-05 ENCOUNTER — Other Ambulatory Visit: Payer: Self-pay | Admitting: *Deleted

## 2013-02-05 ENCOUNTER — Telehealth: Payer: Self-pay | Admitting: *Deleted

## 2013-02-05 ENCOUNTER — Ambulatory Visit (AMBULATORY_SURGERY_CENTER): Payer: 59 | Admitting: Internal Medicine

## 2013-02-05 VITALS — BP 168/67 | HR 62 | Temp 97.9°F | Resp 20 | Ht 60.0 in | Wt 202.0 lb

## 2013-02-05 DIAGNOSIS — R1310 Dysphagia, unspecified: Secondary | ICD-10-CM

## 2013-02-05 DIAGNOSIS — K449 Diaphragmatic hernia without obstruction or gangrene: Secondary | ICD-10-CM

## 2013-02-05 HISTORY — PX: ESOPHAGOGASTRODUODENOSCOPY: SHX1529

## 2013-02-05 MED ORDER — OMEPRAZOLE-SODIUM BICARBONATE 40-1100 MG PO CAPS: 1.0000 | ORAL_CAPSULE | Freq: Every day | ORAL | Status: DC

## 2013-02-05 MED ORDER — SODIUM CHLORIDE 0.9 % IV SOLN: 500.0000 mL | INTRAVENOUS | Status: DC

## 2013-02-05 NOTE — Telephone Encounter (Signed)
Scheduled UGI series as per procedure note at [facility] radiology on 02/10/13 at 9:45/10:00 AM. NPO after midnight(Jennifer). Appointment date, time and instructions given to Rosalita Chessman in [facility] recovery to give to patient.

## 2013-02-05 NOTE — Progress Notes (Signed)
Patient did not have preoperative order for IV antibiotic SSI prophylaxis. (G8918)  Patient did not experience any of the following events: a burn prior to discharge; a fall within the facility; wrong site/side/patient/procedure/implant event; or a hospital transfer or hospital admission upon discharge from the facility. (G8907)  

## 2013-02-05 NOTE — Op Note (Signed)
[facility] [address] [place], [address]   ENDOSCOPY PROCEDURE REPORT  PATIENT: Shanoah, Asbill  MR#: [id number] BIRTHDATE: [birth year] , 76  yrs. old GENDER: Female ENDOSCOPIST: Iva Boop, MD, [facility] PROCEDURE DATE:  02/05/2013 PROCEDURE:  EGD, diagnostic ASA CLASS:     Class II INDICATIONS:  Heartburn.   Dysphagia. MEDICATIONS: Propofol (Diprivan) 120 mg IV TOPICAL ANESTHETIC: Cetacaine Spray  DESCRIPTION OF PROCEDURE: After the risks benefits and alternatives of the procedure were thoroughly explained, informed consent was obtained.  The LB GIF-H180 [id number] endoscope was introduced through the mouth and advanced to the second portion of the duodenum. Without limitations.  The instrument was slowly withdrawn as the mucosa was fully examined.        ESOPHAGUS: A large hiatal hernia was noted.  10 cm (30-40 cm)Retroflexed views revealed a hiatal hernia.    Otherwise normal EGD. The scope was then withdrawn from the patient and the procedure completed.  COMPLICATIONS: There were no complications. ENDOSCOPIC IMPRESSION: Large hiatal hernia - remainder of exam normal  RECOMMENDATIONS: Upper GI Series to be scheduled Add Zegerid 40 mg at bedtime   eSigned:  Iva Boop, MD, [facility] 02/05/2013 2:38 PM   XB:[id number] Minerva Ends, MD and The Patient

## 2013-02-05 NOTE — Patient Instructions (Addendum)
You have a large hiatal hernia - stomach moves up into the chest from the abdominal cavity. This is a likely cause of your symptoms. I want you to have an upper GI series to evaluate this further.  I am going to have you take Zegerid capsule 1 each night also to see if that helps you also.    Hiatal Hernia               A hiatal hernia occurs when a part of the stomach slides above the diaphragm. The diaphragm is the thin muscle separating the belly (abdomen) from the chest. A hiatal hernia can be something you are born with or develop over time. Hiatal hernias may allow stomach acid to flow back into your esophagus, the tube which carries food from your mouth to your stomach. If this acid causes problems it is called GERD (gastro-esophageal reflux disease).  SYMPTOMS  Common symptoms of GERD are heartburn (burning in your chest). This is worse when lying down or bending over. It may also cause belching and indigestion. Some of the things which make GERD worse are:  Increased weight pushes on stomach making acid rise more easily.  Smoking markedly increases acid production.  Alcohol decreases lower esophageal sphincter pressure (valve between stomach and esophagus), allowing acid from stomach into esophagus.  Late evening meals and going to bed with a full stomach increases pressure.  Anything that causes an increase in acid production.  Lower esophageal sphincter incompetence. DIAGNOSIS  Hiatal hernia is often diagnosed with x-rays of your stomach and small bowel. This is called an UGI (upper gastrointestinal x-ray). Sometimes a gastroscopic procedure is done. This is a procedure where your caregiver uses a flexible instrument to look into the stomach and small bowel.  HOME CARE INSTRUCTIONS  Try to achieve and maintain an ideal body weight.  Avoid drinking alcoholic beverages.  Stop smoking.  Put the head of your bed on 4 to 6 inch blocks. This will keep your head and esophagus  higher than your stomach. If you cannot use blocks, sleep with several pillows under your head and shoulders.  Over-the-counter medications will decrease acid production. Your caregiver can also prescribe medications for this. Take as directed.  1/2 to 1 teaspoon of an antacid taken every hour while awake, with meals and at bedtime, will neutralize acid.  Do not take aspirin, ibuprofen (Advil or Motrin), or other nonsteroidal anti-inflammatory drugs.  Do not wear tight clothing around your chest or stomach.  Eat smaller meals and eat more frequently. This keeps your stomach from getting too full. Eat slowly.  Do not lie down for 2 or 3 hours after eating. Do not eat or drink anything 1 to 2 hours before going to bed.  Avoid caffeine beverages (colas, coffee, cocoa, tea), fatty foods, citrus fruits and all other foods and drinks that contain acid and that seem to increase the problems.  Avoid bending over, especially after eating. Also avoid straining during bowel movements or when urinating or lifting things. Anything that increases the pressure in your belly increases the amount of acid that may be pushed up into your esophagus. SEEK IMMEDIATE MEDICAL CARE IF:  There is change in location (pain in arms, neck, jaw, teeth or back) of your pain, or the pain is getting worse.  You also experience nausea, vomiting, sweating (diaphoresis), or shortness of breath.  You develop continual vomiting, vomit blood or coffee ground material, have bright red blood in your stools, or have  black tarry stools. Some of these symptoms could signal other problems such as heart disease.  MAKE SURE YOU:  Understand these instructions.  Monitor your condition.  Contact your caregiver if you are not doing well or are getting worse. Document Released: 01/19/2004 Document Revised: 01/21/2012 Document Reviewed: 10/29/2005  [facility] Patient Information 2013 [place], [place].    Take your Zegrid 40mg  at bedtime every  night.  Upper GI series will be scheduled by Dr. Marvell Fuller CMA on the 3rd floor.  YOU HAD AN ENDOSCOPIC PROCEDURE TODAY AT THE [facility]: Refer to the procedure report that was given to you for any specific questions about what was found during the examination.  If the procedure report does not answer your questions, please call your gastroenterologist to clarify.  If you requested that your care partner not be given the details of your procedure findings, then the procedure report has been included in a sealed envelope for you to review at your convenience later.  YOU SHOULD EXPECT: Some feelings of bloating in the abdomen. Passage of more gas than usual.  Walking can help get rid of the air that was put into your GI tract during the procedure and reduce the bloating. If you had a lower endoscopy (such as a colonoscopy or flexible sigmoidoscopy) you may notice spotting of blood in your stool or on the toilet paper. If you underwent a bowel prep for your procedure, then you may not have a normal bowel movement for a few days.  DIET: Your first meal following the procedure should be a light meal and then it is ok to progress to your normal diet.  A half-sandwich or bowl of soup is an example of a good first meal.  Heavy or fried foods are harder to digest and may make you feel nauseous or bloated.  Likewise meals heavy in dairy and vegetables can cause extra gas to form and this can also increase the bloating.  Drink plenty of fluids but you should avoid alcoholic beverages for 24 hours.  ACTIVITY: Your care partner should take you home directly after the procedure.  You should plan to take it easy, moving slowly for the rest of the day.  You can resume normal activity the day after the procedure however you should NOT DRIVE or use heavy machinery for 24 hours (because of the sedation medicines used during the test).    SYMPTOMS TO REPORT IMMEDIATELY: A gastroenterologist can be reached at  any hour.  During normal business hours, 8:30 AM to 5:00 PM Monday through Friday, call [phone].  After hours and on weekends, please call the GI answering service at [phone] who will take a message and have the physician on call contact you.   Following upper endoscopy (EGD)  Vomiting of blood or coffee ground material  New chest pain or pain under the shoulder blades  Painful or persistently difficult swallowing  New shortness of breath  Fever of 100F or higher  Black, tarry-looking stools  FOLLOW UP: If any biopsies were taken you will be contacted by phone or by letter within the next 1-3 weeks.  Call your gastroenterologist if you have not heard about the biopsies in 3 weeks.  Our staff will call the home number listed on your records the next business day following your procedure to check on you and address any questions or concerns that you may have at that time regarding the information given to you following your procedure. This is a  courtesy call and so if there is no answer at the home number and we have not heard from you through the emergency physician on call, we will assume that you have returned to your regular daily activities without incident.  SIGNATURES/CONFIDENTIALITY: You and/or your care partner have signed paperwork which will be entered into your electronic medical record.  These signatures attest to the fact that that the information above on your After Visit Summary has been reviewed and is understood.  Full responsibility of the confidentiality of this discharge information lies with you and/or your care-partner.

## 2013-02-06 ENCOUNTER — Telehealth: Payer: Self-pay

## 2013-02-06 NOTE — Telephone Encounter (Signed)
  Follow up Call-  Call back number 02/05/2013  Post procedure Call Back phone  # [phone]  Permission to leave phone message Yes     Patient questions:  Do you have a fever, pain , or abdominal swelling? no Pain Score  0 *  Have you tolerated food without any problems? yes  Have you been able to return to your normal activities? yes  Do you have any questions about your discharge instructions: Diet   no Medications  no Follow up visit  no  Do you have questions or concerns about your Care? no  Actions: * If pain score is 4 or above: No action needed, pain <4.

## 2013-02-10 ENCOUNTER — Ambulatory Visit (HOSPITAL_COMMUNITY)
Admission: RE | Admit: 2013-02-10 | Discharge: 2013-02-10 | Disposition: A | Discharge status: Home / Self Care | Payer: Medicare Other | Source: Ambulatory Visit | Attending: Internal Medicine | Admitting: Internal Medicine

## 2013-02-10 DIAGNOSIS — Z9089 Acquired absence of other organs: Secondary | ICD-10-CM | POA: Insufficient documentation

## 2013-02-10 DIAGNOSIS — I700 Atherosclerosis of aorta: Secondary | ICD-10-CM | POA: Insufficient documentation

## 2013-02-10 DIAGNOSIS — M51379 Other intervertebral disc degeneration, lumbosacral region without mention of lumbar back pain or lower extremity pain: Secondary | ICD-10-CM | POA: Insufficient documentation

## 2013-02-10 DIAGNOSIS — M5137 Other intervertebral disc degeneration, lumbosacral region: Secondary | ICD-10-CM | POA: Insufficient documentation

## 2013-02-10 DIAGNOSIS — K449 Diaphragmatic hernia without obstruction or gangrene: Secondary | ICD-10-CM | POA: Insufficient documentation

## 2013-02-10 DIAGNOSIS — R1110 Vomiting, unspecified: Secondary | ICD-10-CM | POA: Insufficient documentation

## 2013-02-10 DIAGNOSIS — K219 Gastro-esophageal reflux disease without esophagitis: Secondary | ICD-10-CM | POA: Insufficient documentation

## 2013-02-10 DIAGNOSIS — R1310 Dysphagia, unspecified: Secondary | ICD-10-CM | POA: Insufficient documentation

## 2013-02-11 ENCOUNTER — Other Ambulatory Visit: Payer: Self-pay | Admitting: *Deleted

## 2013-02-11 MED ORDER — OMEPRAZOLE-SODIUM BICARBONATE 40-1100 MG PO CAPS: 1.0000 | ORAL_CAPSULE | Freq: Every day | ORAL | Status: DC

## 2013-02-11 NOTE — Progress Notes (Signed)
Quick Note:  OK - please make sure she has a months worth of Zegerid samples and have her see me in about 1 month Also give her a gas and flatulence prevention diet ______

## 2013-02-11 NOTE — Progress Notes (Signed)
Quick Note:  Hiatal hernia not as bad as I thought from EGD - proportion of stomach in chest about 15% - could still be cause of problems Is nocturnal Zegerid helping ? Please get a symptom update on heartburn/reflux and dysphagia ______

## 2013-02-16 ENCOUNTER — Encounter: Payer: Self-pay | Admitting: Internal Medicine

## 2013-02-20 ENCOUNTER — Telehealth: Payer: Self-pay | Admitting: Internal Medicine

## 2013-02-20 NOTE — Telephone Encounter (Signed)
PT called about a referral.

## 2013-02-23 ENCOUNTER — Ambulatory Visit: Payer: 59 | Admitting: Internal Medicine

## 2013-02-23 ENCOUNTER — Encounter: Payer: Self-pay | Admitting: Internal Medicine

## 2013-02-23 ENCOUNTER — Ambulatory Visit (INDEPENDENT_AMBULATORY_CARE_PROVIDER_SITE_OTHER): Payer: 59 | Admitting: Internal Medicine

## 2013-02-23 VITALS — BP 156/98 | HR 74 | Wt 200.0 lb

## 2013-02-23 DIAGNOSIS — I10 Essential (primary) hypertension: Secondary | ICD-10-CM

## 2013-02-23 MED ORDER — VALSARTAN 320 MG PO TABS: 320.0000 mg | ORAL_TABLET | Freq: Every day | ORAL | Status: DC

## 2013-02-23 NOTE — Progress Notes (Signed)
  Subjective:    Patient ID: Kylie Patrick, female    DOB: 08/10/1936, 77 y.o.   MRN: [id number]  HPI Chlorthalidone was discontinued in early March because of a creatinine of 2.2; followup creatinine showed dramatic improvement with a value of 1.5. At this time she is on losartan 100 mg daily and carvedilol 25 mg twice a day for her hypertension.    Review of Systems  Home blood pressure range 148/76- 201/98  Patient is compliant with medications  No adverse effects noted from medication  No exercise program due to GI issues  No specific dietary program but on  low-fat low-salt diet    No chest pain, palpitations, significant dyspnea, claudication,edema or paroxysmal nocturnal dyspnea described.Occasional night leg cramps.  No significant lightheadedness,  epistaxis, or syncope.  She's had an aching frontal headache for one week; she blames this on her blood pressure.Aleve helped for a while.         Objective:   Physical Exam  Appears  well-nourished & in no acute distress  No carotid bruits are present.No neck pain distention present at  15 degrees. Thyroid normal to palpation  Heart rhythm and rate are normal with no significant murmurs or gallops. S4  Chest is clear with no increased work of breathing  There is no evidence of aortic aneurysm or renal artery bruits  Abdomen soft with no organomegaly or masses. No HJR  No clubbing, cyanosis or edema present.  Pedal pulses are intact   No ischemic skin changes are present . Nails  Healthy   Alert and oriented. Strength, tone normal       Assessment & Plan:

## 2013-02-23 NOTE — Patient Instructions (Addendum)
Minimal Blood Pressure Goal= AVERAGE < 140/90;  Ideal is an AVERAGE < 135/85. This AVERAGE should be calculated from @ least 5-7 BP readings taken @ different times of day on different days of week. You should not respond to isolated BP readings , but rather the AVERAGE for that week .Please bring your  blood pressure cuff to office visits to verify that it is reliable.It  can also be checked against the blood pressure device at the pharmacy. 

## 2013-02-23 NOTE — Assessment & Plan Note (Signed)
Losartan 100 mg will be changed to generic Diovan 320 mg which should be more efficacious in blood pressure control

## 2013-02-23 NOTE — Telephone Encounter (Signed)
I returned patient's call, she states she is seeing Dr. Alwyn Ren this afternoon & will just discuss referral with him then.

## 2013-03-08 ENCOUNTER — Other Ambulatory Visit: Payer: Self-pay | Admitting: Internal Medicine

## 2013-03-16 ENCOUNTER — Encounter: Payer: Self-pay | Admitting: Internal Medicine

## 2013-03-16 ENCOUNTER — Ambulatory Visit (INDEPENDENT_AMBULATORY_CARE_PROVIDER_SITE_OTHER): Payer: 59 | Admitting: Internal Medicine

## 2013-03-16 VITALS — BP 132/82 | HR 80 | Ht 60.0 in | Wt 199.5 lb

## 2013-03-16 DIAGNOSIS — K449 Diaphragmatic hernia without obstruction or gangrene: Secondary | ICD-10-CM

## 2013-03-16 DIAGNOSIS — Z1211 Encounter for screening for malignant neoplasm of colon: Secondary | ICD-10-CM

## 2013-03-16 DIAGNOSIS — K219 Gastro-esophageal reflux disease without esophagitis: Secondary | ICD-10-CM

## 2013-03-16 MED ORDER — SOD PICOSULFATE-MAG OX-CIT ACD 10-3.5-12 MG-GM-GM PO PACK: 1.0000 | PACK | Freq: Once | ORAL | Status: DC

## 2013-03-16 MED ORDER — OMEPRAZOLE-SODIUM BICARBONATE 40-1100 MG PO CAPS: 1.0000 | ORAL_CAPSULE | Freq: Two times a day (BID) | ORAL | Status: DC

## 2013-03-16 NOTE — Progress Notes (Signed)
  Subjective:    Patient ID: Kylie Patrick, female    DOB: [birth year], 77 y.o.   MRN: [id number]  HPI The patient returns for follow-up of GERD, hiatal hernia and dysphagia/regurgitation. EGD showed a large hiatal hernia. UGIS showed one not as large with 15% of stomach in chest. She added bedtime Zegerid to AM pantoprazole and things are much better. Belches slightly when lying down and sleeps through the night. Only a few episodes of regurgitation after eating. Does not describe impact dysphagia in the classic sense.  Wt Readings from Last 3 Encounters:  03/16/13 199 lb 8 oz (90.493 kg)  02/23/13 200 lb (90.719 kg)  02/05/13 202 lb (91.627 kg)   Medications, allergies, past medical history, past surgical history, family history and social history are reviewed and updated in the EMR.  Review of Systems As above    Objective:   Physical Exam Pleasant, obese ww NAD    Assessment & Plan:  GERD (gastroesophageal reflux disease)  Hiatal hernia   Much better now. Suspect more of a regurgitation problem as opposed to dysphagia.Continue bid PPI but change to Zegerid AM and HS. Will cover with samples and she will sitch to the all Zegerid regimen in a few weeks. We reviewed possible fundoplication - shw would rather try medical Tx. She is aaware of possible bone loss issues w/ PPI - I explained. She is on Ca Mg and vit D.   Special screening for malignant neoplasms, colon   Last colonoscopy 2003 - had a diminutive polyp - do not have pathology. She is concerned about tolerating prep. Will use Prep-o-Pik. The risks and benefits as well as alternatives of endoscopic procedure(s) have been discussed and reviewed. All questions answered. The patient agrees to proceed.

## 2013-03-16 NOTE — Patient Instructions (Addendum)
You have been scheduled for a colonoscopy with propofol. Please follow written instructions given to you at your visit today.  Please use the prep kit we have given you today (prepopik). If you use inhalers (even only as needed), please bring them with you on the day of your procedure. Your physician has requested that you go to www.startemmi.com and enter the access code given to you at your visit today. This web site gives a general overview about your procedure. However, you should still follow specific instructions given to you by our office regarding your preparation for the procedure.  Today we are giving you samples of Zegerid, also a printed rx for this has been given to you.  Finish up the pantoprazole rx and then switch to Zegerid one capsule twice a day.  Today you have been given a handout for your information on correction of acid reflux by laparoscopy.  Thank you for choosing me and [facility].  Iva Boop, M.D.,  [facility]  Zegerid samples given today, lot# [id number], exp. 05/2015, # 15

## 2013-03-20 ENCOUNTER — Ambulatory Visit (AMBULATORY_SURGERY_CENTER): Payer: 59 | Admitting: Internal Medicine

## 2013-03-20 ENCOUNTER — Encounter: Payer: Self-pay | Admitting: Internal Medicine

## 2013-03-20 VITALS — BP 144/76 | HR 61 | Temp 97.5°F | Resp 20 | Ht 60.0 in | Wt 199.0 lb

## 2013-03-20 DIAGNOSIS — K5730 Diverticulosis of large intestine without perforation or abscess without bleeding: Secondary | ICD-10-CM

## 2013-03-20 DIAGNOSIS — Z1211 Encounter for screening for malignant neoplasm of colon: Secondary | ICD-10-CM

## 2013-03-20 MED ORDER — SODIUM CHLORIDE 0.9 % IV SOLN: 500.0000 mL | INTRAVENOUS | Status: DC

## 2013-03-20 NOTE — Op Note (Addendum)
[facility] [address] [place], [address]   COLONOSCOPY PROCEDURE REPORT  PATIENT: Chenise, Mulvihill  MR#: [id number] BIRTHDATE: [birth year] , 76  yrs. old GENDER: Female ENDOSCOPIST: Iva Boop, MD, [facility] PROCEDURE DATE:  03/20/2013 PROCEDURE:   Colonoscopy, screening ASA CLASS:   Class II INDICATIONS:average risk screening. MEDICATIONS: Propofol (Diprivan) 180 mg IV, MAC sedation, administered by CRNA, and These medications were titrated to patient response per physician's verbal order  DESCRIPTION OF PROCEDURE:   After the risks benefits and alternatives of the procedure were thoroughly explained, informed consent was obtained.  A digital rectal exam revealed no rectal mass, there were external tags in the anoderm.   The LB [id number]-[id number]  endoscope was introduced through the anus and advanced to the cecum, which was identified by both the appendix and ileocecal valve. No adverse events experienced.   The quality of the prep was Prepopik excellent  The instrument was then slowly withdrawn as the colon was fully examined.      COLON FINDINGS: Moderate diverticulosis was noted in the sigmoid colon.   The colon mucosa was otherwise normal.  Retroflexed views revealed no abnormalities. The time to cecum=1 minutes 12 seconds. Withdrawal time=10 minutes 51 seconds.  The scope was withdrawn and the procedure completed. COMPLICATIONS: There were no complications.  ENDOSCOPIC IMPRESSION: 1.   Moderate diverticulosis was noted in the sigmoid colon 2.   The colon mucosa was otherwise normal - excellent prep  RECOMMENDATIONS: follow-up as needed - does not need future routine colonoscopy/colon cancer screening   eSigned:  Iva Boop, MD, [facility] 03/20/2013 9:43 AM   cc: The Patient

## 2013-03-20 NOTE — Progress Notes (Signed)
A/ox3, pleased with MAC, report to Jane RN 

## 2013-03-20 NOTE — Progress Notes (Signed)
Patient did not experience any of the following events: a burn prior to discharge; a fall within the facility; wrong site/side/patient/procedure/implant event; or a hospital transfer or hospital admission upon discharge from the facility. (G8907) Patient did not have preoperative order for IV antibiotic SSI prophylaxis. (G8918)  

## 2013-03-20 NOTE — Patient Instructions (Addendum)
No polyps or cancer identified today. Only finding was diverticulosis (and a clean colon).  I do not recommend routine repeat colonoscopy.  I appreciate the opportunity to care for you.  Iva Boop, MD, FACG   YOU HAD AN ENDOSCOPIC PROCEDURE TODAY AT THE [facility]: Refer to the procedure report that was given to you for any specific questions about what was found during the examination.  If the procedure report does not answer your questions, please call your gastroenterologist to clarify.  If you requested that your care partner not be given the details of your procedure findings, then the procedure report has been included in a sealed envelope for you to review at your convenience later.  YOU SHOULD EXPECT: Some feelings of bloating in the abdomen. Passage of more gas than usual.  Walking can help get rid of the air that was put into your GI tract during the procedure and reduce the bloating. If you had a lower endoscopy (such as a colonoscopy or flexible sigmoidoscopy) you may notice spotting of blood in your stool or on the toilet paper. If you underwent a bowel prep for your procedure, then you may not have a normal bowel movement for a few days.  DIET: Your first meal following the procedure should be a light meal and then it is ok to progress to your normal diet.  A half-sandwich or bowl of soup is an example of a good first meal.  Heavy or fried foods are harder to digest and may make you feel nauseous or bloated.  Likewise meals heavy in dairy and vegetables can cause extra gas to form and this can also increase the bloating.  Drink plenty of fluids but you should avoid alcoholic beverages for 24 hours.  ACTIVITY: Your care partner should take you home directly after the procedure.  You should plan to take it easy, moving slowly for the rest of the day.  You can resume normal activity the day after the procedure however you should NOT DRIVE or use heavy machinery for 24  hours (because of the sedation medicines used during the test).    SYMPTOMS TO REPORT IMMEDIATELY: A gastroenterologist can be reached at any hour.  During normal business hours, 8:30 AM to 5:00 PM Monday through Friday, call [phone].  After hours and on weekends, please call the GI answering service at [phone] who will take a message and have the physician on call contact you.   Following lower endoscopy (colonoscopy or flexible sigmoidoscopy):  Excessive amounts of blood in the stool  Significant tenderness or worsening of abdominal pains  Swelling of the abdomen that is new, acute  Fever of 100F or higher  FOLLOW UP: If any biopsies were taken you will be contacted by phone or by letter within the next 1-3 weeks.  Call your gastroenterologist if you have not heard about the biopsies in 3 weeks.  Our staff will call the home number listed on your records the next business day following your procedure to check on you and address any questions or concerns that you may have at that time regarding the information given to you following your procedure. This is a courtesy call and so if there is no answer at the home number and we have not heard from you through the emergency physician on call, we will assume that you have returned to your regular daily activities without incident.  SIGNATURES/CONFIDENTIALITY: You and/or your care partner have signed paperwork which  will be entered into your electronic medical record.  These signatures attest to the fact that that the information above on your After Visit Summary has been reviewed and is understood.  Full responsibility of the confidentiality of this discharge information lies with you and/or your care-partner.  Diverticulosis information given.

## 2013-03-23 ENCOUNTER — Telehealth: Payer: Self-pay | Admitting: *Deleted

## 2013-03-23 NOTE — Telephone Encounter (Signed)
  Follow up Call-  Call back number 03/20/2013 02/05/2013  Post procedure Call Back phone  # [phone]  Permission to leave phone message Yes Yes     Patient questions:  Do you have a fever, pain , or abdominal swelling? no Pain Score  0 *  Have you tolerated food without any problems? yes  Have you been able to return to your normal activities? yes  Do you have any questions about your discharge instructions: Diet   no Medications  no Follow up visit  no  Do you have questions or concerns about your Care? no  Actions: * If pain score is 4 or above: No action needed, pain <4.

## 2013-05-11 ENCOUNTER — Encounter (HOSPITAL_COMMUNITY): Payer: Self-pay | Admitting: Pharmacy Technician

## 2013-05-11 NOTE — Patient Instructions (Addendum)
Your procedure is scheduled on: 05/14/2013  Report to [facility] at 1300 PM.  Call this number if you have problems the morning of surgery: [phone]   Do not eat food or drink liquids :After Midnight.      Take these medicines the morning of surgery with A SIP OF WATER: coreg, chlorthalidone,zegerid, diovan, celexa, neurontin, pronotix   Do not wear jewelry, make-up or nail polish.  Do not wear lotions, powders, or perfumes.   Do not shave 48 hours prior to surgery.  Do not bring valuables to the hospital.  Contacts, dentures or bridgework may not be worn into surgery.  Leave suitcase in the car. After surgery it may be brought to your room.  For patients admitted to the hospital, checkout time is 11:00 AM the day of discharge.   Patients discharged the day of surgery will not be allowed to drive home.  :     Please read over the following fact sheets that you were given: Coughing and Deep Breathing, Surgical Site Infection Prevention, Anesthesia Post-op Instructions and Care and Recovery After Surgery    Cataract A cataract is a clouding of the lens of the eye. When a lens becomes cloudy, vision is reduced based on the degree and nature of the clouding. Many cataracts reduce vision to some degree. Some cataracts make people more near-sighted as they develop. Other cataracts increase glare. Cataracts that are ignored and become worse can sometimes look white. The white color can be seen through the pupil. CAUSES   Aging. However, cataracts may occur at any age, even in newborns.   Certain drugs.   Trauma to the eye.   Certain diseases such as diabetes.   Specific eye diseases such as chronic inflammation inside the eye or a sudden attack of a rare form of glaucoma.   Inherited or acquired medical problems.  SYMPTOMS   Gradual, progressive drop in vision in the affected eye.   Severe, rapid visual loss. This most often happens when trauma is the cause.  DIAGNOSIS  To detect  a cataract, an eye doctor examines the lens. Cataracts are best diagnosed with an exam of the eyes with the pupils enlarged (dilated) by drops.  TREATMENT  For an early cataract, vision may improve by using different eyeglasses or stronger lighting. If that does not help your vision, surgery is the only effective treatment. A cataract needs to be surgically removed when vision loss interferes with your everyday activities, such as driving, reading, or watching TV. A cataract may also have to be removed if it prevents examination or treatment of another eye problem. Surgery removes the cloudy lens and usually replaces it with a substitute lens (intraocular lens, IOL).  At a time when both you and your doctor agree, the cataract will be surgically removed. If you have cataracts in both eyes, only one is usually removed at a time. This allows the operated eye to heal and be out of danger from any possible problems after surgery (such as infection or poor wound healing). In rare cases, a cataract may be doing damage to your eye. In these cases, your caregiver may advise surgical removal right away. The vast majority of people who have cataract surgery have better vision afterward. HOME CARE INSTRUCTIONS  If you are not planning surgery, you may be asked to do the following:  Use different eyeglasses.   Use stronger or brighter lighting.   Ask your eye doctor about reducing your medicine dose or  changing medicines if it is thought that a medicine caused your cataract. Changing medicines does not make the cataract go away on its own.   Become familiar with your surroundings. Poor vision can lead to injury. Avoid bumping into things on the affected side. You are at a higher risk for tripping or falling.   Exercise extreme care when driving or operating machinery.   Wear sunglasses if you are sensitive to bright light or experiencing problems with glare.  SEEK IMMEDIATE MEDICAL CARE IF:   You have a  worsening or sudden vision loss.   You notice redness, swelling, or increasing pain in the eye.   You have a fever.  Document Released: 10/29/2005 Document Revised: 10/18/2011 Document Reviewed: 06/22/2011 [facility] Patient Information 2012 [place], [place].PATIENT INSTRUCTIONS POST-ANESTHESIA  IMMEDIATELY FOLLOWING SURGERY:  Do not drive or operate machinery for the first twenty four hours after surgery.  Do not make any important decisions for twenty four hours after surgery or while taking narcotic pain medications or sedatives.  If you develop intractable nausea and vomiting or a severe headache please notify your doctor immediately.  FOLLOW-UP:  Please make an appointment with your surgeon as instructed. You do not need to follow up with anesthesia unless specifically instructed to do so.  WOUND CARE INSTRUCTIONS (if applicable):  Keep a dry clean dressing on the anesthesia/puncture wound site if there is drainage.  Once the wound has quit draining you may leave it open to air.  Generally you should leave the bandage intact for twenty four hours unless there is drainage.  If the epidural site drains for more than 36-48 hours please call the anesthesia department.  QUESTIONS?:  Please feel free to call your physician or the hospital operator if you have any questions, and they will be happy to assist you.

## 2013-05-12 ENCOUNTER — Encounter (HOSPITAL_COMMUNITY): Payer: Self-pay

## 2013-05-12 ENCOUNTER — Encounter (HOSPITAL_COMMUNITY)
Admission: RE | Admit: 2013-05-12 | Discharge: 2013-05-12 | Disposition: A | Discharge status: Home / Self Care | Payer: Medicare Other | Source: Ambulatory Visit | Attending: Ophthalmology | Admitting: Ophthalmology

## 2013-05-12 LAB — BASIC METABOLIC PANEL
CO2: 30 mEq/L (ref 19–32)
Chloride: 104 mEq/L (ref 96–112)
Glucose, Bld: 92 mg/dL (ref 70–99)
Potassium: 4.3 mEq/L (ref 3.5–5.1)
Sodium: 141 mEq/L (ref 135–145)

## 2013-05-12 LAB — HEMOGLOBIN AND HEMATOCRIT, BLOOD
HCT: 38.8 % (ref 36.0–46.0)
Hemoglobin: 12.3 g/dL (ref 12.0–15.0)

## 2013-05-13 MED ORDER — CYCLOPENTOLATE-PHENYLEPHRINE 0.2-1 % OP SOLN
OPHTHALMIC | Status: AC
  Filled 2013-05-13: qty 2

## 2013-05-13 MED ORDER — LIDOCAINE HCL (PF) 1 % IJ SOLN
INTRAMUSCULAR | Status: AC
  Filled 2013-05-13: qty 2

## 2013-05-13 MED ORDER — TETRACAINE HCL 0.5 % OP SOLN
OPHTHALMIC | Status: AC
  Filled 2013-05-13: qty 2

## 2013-05-13 MED ORDER — NEOMYCIN-POLYMYXIN-DEXAMETH 3.5-10000-0.1 OP OINT
TOPICAL_OINTMENT | OPHTHALMIC | Status: AC
  Filled 2013-05-13: qty 3.5

## 2013-05-13 MED ORDER — LIDOCAINE HCL 3.5 % OP GEL
OPHTHALMIC | Status: AC
  Filled 2013-05-13: qty 5

## 2013-05-14 ENCOUNTER — Ambulatory Visit (HOSPITAL_COMMUNITY): Payer: Medicare Other | Admitting: Anesthesiology

## 2013-05-14 ENCOUNTER — Encounter (HOSPITAL_COMMUNITY): Payer: Self-pay | Admitting: Anesthesiology

## 2013-05-14 ENCOUNTER — Encounter (HOSPITAL_COMMUNITY)
Admission: RE | Disposition: A | Discharge status: Home / Self Care | Payer: Self-pay | Source: Ambulatory Visit | Attending: Ophthalmology

## 2013-05-14 ENCOUNTER — Ambulatory Visit (HOSPITAL_COMMUNITY)
Admission: RE | Admit: 2013-05-14 | Discharge: 2013-05-14 | Disposition: A | Discharge status: Home / Self Care | Payer: Medicare Other | Source: Ambulatory Visit | Attending: Ophthalmology | Admitting: Ophthalmology

## 2013-05-14 ENCOUNTER — Encounter (HOSPITAL_COMMUNITY): Payer: Self-pay | Admitting: Ophthalmology

## 2013-05-14 DIAGNOSIS — H2589 Other age-related cataract: Secondary | ICD-10-CM | POA: Insufficient documentation

## 2013-05-14 HISTORY — PX: CATARACT EXTRACTION W/PHACO: SHX586

## 2013-05-14 SURGERY — PHACOEMULSIFICATION, CATARACT, WITH IOL INSERTION
Anesthesia: Monitor Anesthesia Care | Site: Eye | Laterality: Left | Wound class: Clean

## 2013-05-14 MED ORDER — LACTATED RINGERS IV SOLN
INTRAVENOUS | Status: DC
  Administered 2013-05-14: 1000 mL via INTRAVENOUS

## 2013-05-14 MED ORDER — POVIDONE-IODINE 5 % OP SOLN
OPHTHALMIC | Status: DC | PRN
  Administered 2013-05-14: 1 via OPHTHALMIC

## 2013-05-14 MED ORDER — EPINEPHRINE HCL 1 MG/ML IJ SOLN
INTRAMUSCULAR | Status: AC
  Filled 2013-05-14: qty 1

## 2013-05-14 MED ORDER — LIDOCAINE HCL (PF) 1 % IJ SOLN
INTRAMUSCULAR | Status: DC | PRN
  Administered 2013-05-14: .4 mL

## 2013-05-14 MED ORDER — TETRACAINE HCL 0.5 % OP SOLN
1.0000 [drp] | OPHTHALMIC | Status: AC
  Administered 2013-05-14 (×3): 1 [drp] via OPHTHALMIC

## 2013-05-14 MED ORDER — LABETALOL HCL 5 MG/ML IV SOLN
INTRAVENOUS | Status: DC | PRN
  Administered 2013-05-14: 10 mg via INTRAVENOUS

## 2013-05-14 MED ORDER — PROVISC 10 MG/ML IO SOLN
INTRAOCULAR | Status: DC | PRN
  Administered 2013-05-14: 8.5 mg via INTRAOCULAR

## 2013-05-14 MED ORDER — FENTANYL CITRATE 0.05 MG/ML IJ SOLN: 25.0000 ug | INTRAMUSCULAR | Status: DC | PRN

## 2013-05-14 MED ORDER — ONDANSETRON HCL 4 MG/2ML IJ SOLN: 4.0000 mg | Freq: Once | INTRAMUSCULAR | Status: DC | PRN

## 2013-05-14 MED ORDER — CYCLOPENTOLATE-PHENYLEPHRINE 0.2-1 % OP SOLN
1.0000 [drp] | OPHTHALMIC | Status: AC
  Administered 2013-05-14 (×3): 1 [drp] via OPHTHALMIC

## 2013-05-14 MED ORDER — LIDOCAINE 3.5 % OP GEL OPTIME - NO CHARGE
OPHTHALMIC | Status: DC | PRN
  Administered 2013-05-14: 1 [drp] via OPHTHALMIC

## 2013-05-14 MED ORDER — PHENYLEPHRINE HCL 2.5 % OP SOLN
OPHTHALMIC | Status: AC
  Filled 2013-05-14: qty 15

## 2013-05-14 MED ORDER — PHENYLEPHRINE HCL 2.5 % OP SOLN
1.0000 [drp] | OPHTHALMIC | Status: AC
  Administered 2013-05-14 (×3): 1 [drp] via OPHTHALMIC

## 2013-05-14 MED ORDER — LIDOCAINE HCL 3.5 % OP GEL
1.0000 "application " | Freq: Once | OPHTHALMIC | Status: AC
  Administered 2013-05-14: 1 via OPHTHALMIC

## 2013-05-14 MED ORDER — BSS IO SOLN
INTRAOCULAR | Status: DC | PRN
  Administered 2013-05-14: 15 mL via INTRAOCULAR

## 2013-05-14 MED ORDER — MIDAZOLAM HCL 2 MG/2ML IJ SOLN
INTRAMUSCULAR | Status: AC
  Administered 2013-05-14: 2 mg via INTRAVENOUS
  Filled 2013-05-14: qty 2

## 2013-05-14 MED ORDER — NEOMYCIN-POLYMYXIN-DEXAMETH 0.1 % OP OINT
TOPICAL_OINTMENT | OPHTHALMIC | Status: DC | PRN
  Administered 2013-05-14: 1 via OPHTHALMIC

## 2013-05-14 MED ORDER — LABETALOL HCL 5 MG/ML IV SOLN
INTRAVENOUS | Status: AC
  Filled 2013-05-14: qty 4

## 2013-05-14 MED ORDER — MIDAZOLAM HCL 2 MG/2ML IJ SOLN: 1.0000 mg | INTRAMUSCULAR | Status: DC | PRN

## 2013-05-14 MED ORDER — EPINEPHRINE HCL 1 MG/ML IJ SOLN
INTRAOCULAR | Status: DC | PRN
  Administered 2013-05-14: 15:00:00

## 2013-05-14 SURGICAL SUPPLY — 32 items

## 2013-05-14 NOTE — Anesthesia Preprocedure Evaluation (Signed)
Anesthesia Evaluation  Patient identified by MRN, date of birth, ID band Patient awake    Reviewed: Allergy & Precautions, H&P , NPO status , Patient's Chart, lab work & pertinent test results  Airway Mallampati: II      Dental  (+) Teeth Intact   Pulmonary neg pulmonary ROS,  breath sounds clear to auscultation        Cardiovascular hypertension, Pt. on medications Rhythm:Regular Rate:Normal     Neuro/Psych PSYCHIATRIC DISORDERS Anxiety    GI/Hepatic GERD-  Medicated,  Endo/Other    Renal/GU Renal InsufficiencyRenal disease     Musculoskeletal   Abdominal   Peds  Hematology  (+) Blood dyscrasia, ,   Anesthesia Other Findings   Reproductive/Obstetrics                           Anesthesia Physical Anesthesia Plan  ASA: III  Anesthesia Plan: MAC   Post-op Pain Management:    Induction: Intravenous  Airway Management Planned: Nasal Cannula  Additional Equipment:   Intra-op Plan:   Post-operative Plan:   Informed Consent: I have reviewed the patients History and Physical, chart, labs and discussed the procedure including the risks, benefits and alternatives for the proposed anesthesia with the patient or authorized representative who has indicated his/her understanding and acceptance.     Plan Discussed with:   Anesthesia Plan Comments:         Anesthesia Quick Evaluation

## 2013-05-14 NOTE — H&P (Signed)
I have reviewed the H&P, the patient was re-examined, and I have identified no interval changes in medical condition and plan of care since the history and physical of record  

## 2013-05-14 NOTE — Transfer of Care (Signed)
Immediate Anesthesia Transfer of Care Note  Patient: Kylie Patrick  Procedure(s) Performed: Procedure(s) with comments: CATARACT EXTRACTION PHACO AND INTRAOCULAR LENS PLACEMENT (IOC) (Left) - CDE:9.76  Patient Location: Short Stay  Anesthesia Type:MAC  Level of Consciousness: awake, alert , oriented and patient cooperative  Airway & Oxygen Therapy: Patient Spontanous Breathing  Post-op Assessment: Report given to PACU RN  Post vital signs: Reviewed and stable  Complications: No apparent anesthesia complications

## 2013-05-14 NOTE — Anesthesia Postprocedure Evaluation (Signed)
  Anesthesia Post-op Note  Patient: Kylie Patrick  Procedure(s) Performed: Procedure(s) with comments: CATARACT EXTRACTION PHACO AND INTRAOCULAR LENS PLACEMENT (IOC) (Left) - CDE:9.76  Patient Location: Short Stay  Anesthesia Type:MAC  Level of Consciousness: awake, alert , oriented and patient cooperative  Airway and Oxygen Therapy: Patient Spontanous Breathing  Post-op Pain: none  Post-op Assessment: Post-op Vital signs reviewed, Patient's Cardiovascular Status Stable, Respiratory Function Stable and No signs of Nausea or vomiting  Post-op Vital Signs: Reviewed and stable  Complications: No apparent anesthesia complications

## 2013-05-14 NOTE — Anesthesia Postprocedure Evaluation (Signed)
  Anesthesia Post-op Note  Patient: Kylie Patrick  Procedure(s) Performed: Procedure(s) with comments: CATARACT EXTRACTION PHACO AND INTRAOCULAR LENS PLACEMENT (IOC) (Left) - CDE:9.76  Patient Location: Short Stay  Anesthesia Type:MAC  Level of Consciousness: awake, alert , oriented and patient cooperative  Airway and Oxygen Therapy: Patient Spontanous Breathing  Post-op Pain: none  Post-op Assessment: Post-op Vital signs reviewed, PATIENT'S CARDIOVASCULAR STATUS UNSTABLE, Respiratory Function Stable, Patent Airway, No signs of Nausea or vomiting and Pain level controlled  Post-op Vital Signs: Reviewed  Complications: No apparent anesthesia complications

## 2013-05-18 ENCOUNTER — Encounter (HOSPITAL_COMMUNITY): Payer: Self-pay | Admitting: Ophthalmology

## 2013-05-26 ENCOUNTER — Other Ambulatory Visit: Payer: Self-pay | Admitting: Internal Medicine

## 2013-05-27 ENCOUNTER — Encounter: Payer: Self-pay | Admitting: Internal Medicine

## 2013-05-27 ENCOUNTER — Ambulatory Visit (INDEPENDENT_AMBULATORY_CARE_PROVIDER_SITE_OTHER): Payer: Medicare Other | Admitting: Internal Medicine

## 2013-05-27 VITALS — BP 140/88 | HR 67 | Wt 193.0 lb

## 2013-05-27 DIAGNOSIS — I10 Essential (primary) hypertension: Secondary | ICD-10-CM

## 2013-05-27 DIAGNOSIS — N182 Chronic kidney disease, stage 2 (mild): Secondary | ICD-10-CM

## 2013-05-27 MED ORDER — VALSARTAN 320 MG PO TABS: ORAL_TABLET | ORAL | Status: DC

## 2013-05-27 MED ORDER — FUROSEMIDE 40 MG PO TABS: 40.0000 mg | ORAL_TABLET | Freq: Every day | ORAL | Status: DC

## 2013-05-27 NOTE — Patient Instructions (Addendum)
Minimal Blood Pressure Goal= AVERAGE < 140/90;  Ideal is an AVERAGE < 135/85. This AVERAGE should be calculated from @ least 5-7 BP readings taken @ different times of day on different days of week. You should not respond to isolated BP readings , but rather the AVERAGE for that week .Please bring your  blood pressure cuff to office visits to verify that it is reliable.It  can also be checked against the blood pressure device at the pharmacy. 

## 2013-05-27 NOTE — Progress Notes (Signed)
  Subjective:    Patient ID: Kylie Patrick, female    DOB: 1936-03-17, 77 y.o.   MRN: [id number]  HPI CHRONIC HYPERTENSION follow-up:  Home blood pressure range/ average  129/ 80-170/90  Patient is compliant with medications  No adverse effects noted from medication  Exercise program as walking 3  times per week for 20 minutes  On  low carb ; low-salt diet          Review of Systems  No chest pain,  dyspnea, claudication, or paroxysmal nocturnal dyspnea described. Occasional edema & occasional palpitations @ night @ rest, not with exertion.  No significant lightheadedness, epistaxis, or syncope. Frequent R frontal headaches; Tylenol helps.        Objective:   Physical Exam  Appears well-nourished & in no acute distress  No carotid bruits are present.No neck pain distention present at 10 - 15 degrees. Thyroid normal to palpation  Heart rhythm and rate are normal with no significant murmurs or gallops.  Chest is clear with no increased work of breathing  There is no evidence of aortic aneurysm or renal artery bruits  Abdomen soft with no organomegaly or masses. No HJR  No clubbing, cyanosis or edema present.  Pedal pulses are intact   No ischemic skin changes are present . Nails  Healthy   Alert and oriented. Strength, tone normal        Assessment & Plan:  See Current Assessment & Plan in Problem List under specific Diagnosis

## 2013-05-27 NOTE — Assessment & Plan Note (Signed)
Add Lasix 40 mg qd

## 2013-06-01 ENCOUNTER — Encounter (HOSPITAL_COMMUNITY): Payer: Self-pay | Admitting: Pharmacy Technician

## 2013-06-03 ENCOUNTER — Encounter (HOSPITAL_COMMUNITY): Payer: Self-pay

## 2013-06-03 ENCOUNTER — Encounter (HOSPITAL_COMMUNITY)
Admission: RE | Admit: 2013-06-03 | Discharge: 2013-06-03 | Disposition: A | Discharge status: Home / Self Care | Payer: Medicare Other | Source: Ambulatory Visit | Admitting: Ophthalmology

## 2013-06-05 MED ORDER — LIDOCAINE HCL (PF) 1 % IJ SOLN
INTRAMUSCULAR | Status: AC
  Filled 2013-06-05: qty 2

## 2013-06-05 MED ORDER — CYCLOPENTOLATE-PHENYLEPHRINE 0.2-1 % OP SOLN
OPHTHALMIC | Status: AC
  Filled 2013-06-05: qty 2

## 2013-06-05 MED ORDER — LIDOCAINE HCL 3.5 % OP GEL
OPHTHALMIC | Status: AC
  Filled 2013-06-05: qty 5

## 2013-06-05 MED ORDER — NEOMYCIN-POLYMYXIN-DEXAMETH 3.5-10000-0.1 OP OINT
TOPICAL_OINTMENT | OPHTHALMIC | Status: AC
  Filled 2013-06-05: qty 3.5

## 2013-06-05 MED ORDER — TETRACAINE HCL 0.5 % OP SOLN
OPHTHALMIC | Status: AC
  Filled 2013-06-05: qty 2

## 2013-06-08 ENCOUNTER — Ambulatory Visit (HOSPITAL_COMMUNITY): Payer: Medicare Other | Admitting: Anesthesiology

## 2013-06-08 ENCOUNTER — Encounter (HOSPITAL_COMMUNITY): Payer: Self-pay | Admitting: Anesthesiology

## 2013-06-08 ENCOUNTER — Encounter (HOSPITAL_COMMUNITY): Payer: Self-pay | Admitting: *Deleted

## 2013-06-08 ENCOUNTER — Encounter (HOSPITAL_COMMUNITY)
Admission: RE | Disposition: A | Discharge status: Home Health | Payer: Self-pay | Source: Ambulatory Visit | Attending: Ophthalmology

## 2013-06-08 ENCOUNTER — Ambulatory Visit (HOSPITAL_COMMUNITY)
Admission: RE | Admit: 2013-06-08 | Discharge: 2013-06-08 | Disposition: A | Discharge status: Home Health | Payer: Medicare Other | Source: Ambulatory Visit | Attending: Ophthalmology | Admitting: Ophthalmology

## 2013-06-08 DIAGNOSIS — H2589 Other age-related cataract: Secondary | ICD-10-CM | POA: Insufficient documentation

## 2013-06-08 DIAGNOSIS — H268 Other specified cataract: Secondary | ICD-10-CM | POA: Insufficient documentation

## 2013-06-08 DIAGNOSIS — I10 Essential (primary) hypertension: Secondary | ICD-10-CM | POA: Insufficient documentation

## 2013-06-08 HISTORY — PX: CATARACT EXTRACTION W/PHACO: SHX586

## 2013-06-08 SURGERY — PHACOEMULSIFICATION, CATARACT, WITH IOL INSERTION
Anesthesia: Monitor Anesthesia Care | Site: Eye | Laterality: Right | Wound class: Clean

## 2013-06-08 MED ORDER — TETRACAINE HCL 0.5 % OP SOLN
1.0000 [drp] | OPHTHALMIC | Status: AC
  Administered 2013-06-08 (×3): 1 [drp] via OPHTHALMIC

## 2013-06-08 MED ORDER — PHENYLEPHRINE HCL 2.5 % OP SOLN
1.0000 [drp] | OPHTHALMIC | Status: AC
  Administered 2013-06-08 (×3): 1 [drp] via OPHTHALMIC

## 2013-06-08 MED ORDER — BSS IO SOLN
INTRAOCULAR | Status: DC | PRN
  Administered 2013-06-08: 15 mL via INTRAOCULAR

## 2013-06-08 MED ORDER — LACTATED RINGERS IV SOLN
INTRAVENOUS | Status: DC | PRN
  Administered 2013-06-08: 09:00:00 via INTRAVENOUS

## 2013-06-08 MED ORDER — CYCLOPENTOLATE-PHENYLEPHRINE 0.2-1 % OP SOLN
1.0000 [drp] | OPHTHALMIC | Status: AC
  Administered 2013-06-08 (×3): 1 [drp] via OPHTHALMIC

## 2013-06-08 MED ORDER — LIDOCAINE HCL 3.5 % OP GEL: 1.0000 "application " | Freq: Once | OPHTHALMIC | Status: DC

## 2013-06-08 MED ORDER — MIDAZOLAM HCL 2 MG/2ML IJ SOLN
INTRAMUSCULAR | Status: AC
  Filled 2013-06-08: qty 2

## 2013-06-08 MED ORDER — MIDAZOLAM HCL 2 MG/2ML IJ SOLN
1.0000 mg | INTRAMUSCULAR | Status: DC | PRN
  Administered 2013-06-08: 2 mg via INTRAVENOUS

## 2013-06-08 MED ORDER — LIDOCAINE HCL (PF) 1 % IJ SOLN
INTRAMUSCULAR | Status: DC | PRN
  Administered 2013-06-08: .5 mL

## 2013-06-08 MED ORDER — LIDOCAINE 3.5 % OP GEL OPTIME - NO CHARGE
OPHTHALMIC | Status: DC | PRN
  Administered 2013-06-08: 2 [drp] via OPHTHALMIC

## 2013-06-08 MED ORDER — PROVISC 10 MG/ML IO SOLN
INTRAOCULAR | Status: DC | PRN
  Administered 2013-06-08: 8.5 mg via INTRAOCULAR

## 2013-06-08 MED ORDER — POVIDONE-IODINE 5 % OP SOLN
OPHTHALMIC | Status: DC | PRN
  Administered 2013-06-08: 1 via OPHTHALMIC

## 2013-06-08 MED ORDER — NEOMYCIN-POLYMYXIN-DEXAMETH 0.1 % OP OINT
TOPICAL_OINTMENT | OPHTHALMIC | Status: DC | PRN
  Administered 2013-06-08: 1 via OPHTHALMIC

## 2013-06-08 MED ORDER — LACTATED RINGERS IV SOLN
INTRAVENOUS | Status: DC
Start: 2013-06-08 — End: 2013-06-08
  Administered 2013-06-08: 09:00:00 via INTRAVENOUS

## 2013-06-08 MED ORDER — EPINEPHRINE HCL 1 MG/ML IJ SOLN
INTRAMUSCULAR | Status: DC | PRN
  Administered 2013-06-08: 10:00:00

## 2013-06-08 SURGICAL SUPPLY — 32 items

## 2013-06-08 NOTE — Op Note (Signed)
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  Assistants: None  Anesthesia: Topical with MAC  Indications: Painless, progressive loss of vision with compromise of daily activities.  Surgery: Cataract Extraction with Intraocular lens Implant Right Eye  Discription: The patient had dilating drops and viscous lidocaine placed into the left eye in the pre-op holding area. After transfer to the operating room, a time out was performed. The patient was then prepped and draped. Beginning with a 75 degree blade a paracentesis port was made at the surgeon's 2 o'clock position. The anterior chamber was then filled with 1% non-preserved lidocaine. This was followed by filling the anterior chamber with Provisc. A bent cystatome needle was used to create a continuous tear capsulotomy. Hydrodissection was performed with balanced salt solution on a Fine canula. The lens nucleus was then removed using the phacoemulsification handpiece. Residual cortex was removed with the I&A handpiece. The anterior chamber and capsular bag were refilled with Provisc. A posterior chamber intraocular lens was placed into the capsular bag with it's injector. The implant was positioned with the Kuglan hook. The Provisc was then removed from the anterior chamber and capsular bag with the I&A handpiece. Stromal hydration of the main incision and paracentesis port was performed with BSS on a Fine canula. The wounds were tested for leak which was negative. The patient tolerated the procedure well. There were no operative complications. The patient was then transferred to the recovery room in stable condition.  Complications: None  Specimen: None  EBL: None  Prosthetic device: B&L enVista, MX60, power 19.5D, SN [id number].

## 2013-06-08 NOTE — Anesthesia Postprocedure Evaluation (Signed)
  Anesthesia Post-op Note  Patient: Kylie Patrick  Procedure(s) Performed: Procedure(s) with comments: CATARACT EXTRACTION PHACO AND INTRAOCULAR LENS PLACEMENT (IOC) (Right) - CDE 13.65  Patient Location: Short Stay  Anesthesia Type:MAC  Level of Consciousness: awake, alert , oriented and patient cooperative  Airway and Oxygen Therapy: Patient Spontanous Breathing  Post-op Pain: none  Post-op Assessment: Post-op Vital signs reviewed, Patient's Cardiovascular Status Stable, Respiratory Function Stable, Patent Airway, No signs of Nausea or vomiting and Pain level controlled  Post-op Vital Signs: Reviewed and stable  Complications: No apparent anesthesia complications

## 2013-06-08 NOTE — Anesthesia Procedure Notes (Signed)
Procedure Name: MAC Date/Time: 06/08/2013 9:47 AM Performed by: Carolyne Littles, AMY L Pre-anesthesia Checklist: Patient identified, Timeout performed, Emergency Drugs available, Suction available and Patient being monitored Oxygen Delivery Method: Nasal cannula

## 2013-06-08 NOTE — Op Note (Deleted)
Date of Admission: 06/08/2013  Date of Surgery: 06/08/2013  Pre-Op Dx: Cataract  Right  Eye  Post-Op Dx: Cataract  Right  Eye,  Dx Code 366.19  Surgeon: Gemma Payor, M.D.  Assistants: None  Anesthesia: Topical with MAC  Indications: Painless, progressive loss of vision with compromise of daily activities.  Surgery: Cataract Extraction with Intraocular lens Implant Right Eye  Discription: The patient had dilating drops and viscous lidocaine placed into the left eye in the pre-op holding area. After transfer to the operating room, a time out was performed. The patient was then prepped and draped. Beginning with a 75 degree blade a paracentesis port was made at the surgeon's 2 o'clock position. The anterior chamber was then filled with 1% non-preserved lidocaine. This was followed by filling the anterior chamber with Provisc. A bent cystatome needle was used to create a continuous tear capsulotomy. Hydrodissection was performed with balanced salt solution on a Fine canula. The lens nucleus was then removed using the phacoemulsification handpiece. Residual cortex was removed with the I&A handpiece. The anterior chamber and capsular bag were refilled with Provisc. A posterior chamber intraocular lens was placed into the capsular bag with it's injector. The implant was positioned with the Kuglan hook. The Provisc was then removed from the anterior chamber and capsular bag with the I&A handpiece. Stromal hydration of the main incision and paracentesis port was performed with BSS on a Fine canula. The wounds were tested for leak which was negative. The patient tolerated the procedure well. There were no operative complications. The patient was then transferred to the recovery room in stable condition.  Complications: None  Specimen: None  EBL: None  Prosthetic device: B&L enVista, MX60.

## 2013-06-08 NOTE — H&P (Signed)
I have reviewed the H&P, the patient was re-examined, and I have identified no interval changes in medical condition and plan of care since the history and physical of record  

## 2013-06-08 NOTE — Transfer of Care (Signed)
Immediate Anesthesia Transfer of Care Note  Patient: Kylie Patrick  Procedure(s) Performed: Procedure(s) with comments: CATARACT EXTRACTION PHACO AND INTRAOCULAR LENS PLACEMENT (IOC) (Right) - CDE 13.65  Patient Location: Short Stay  Anesthesia Type:MAC  Level of Consciousness: awake, alert , oriented and patient cooperative  Airway & Oxygen Therapy: Patient Spontanous Breathing  Post-op Assessment: Report given to PACU RN and Post -op Vital signs reviewed and stable  Post vital signs: Reviewed and stable  Complications: No apparent anesthesia complications

## 2013-06-08 NOTE — Anesthesia Preprocedure Evaluation (Signed)
Anesthesia Evaluation  Patient identified by MRN, date of birth, ID band Patient awake    Reviewed: Allergy & Precautions, H&P , NPO status , Patient's Chart, lab work & pertinent test results  Airway Mallampati: II      Dental  (+) Teeth Intact   Pulmonary neg pulmonary ROS,  breath sounds clear to auscultation        Cardiovascular hypertension, Pt. on medications Rhythm:Regular Rate:Normal     Neuro/Psych PSYCHIATRIC DISORDERS Anxiety    GI/Hepatic GERD-  Medicated,  Endo/Other    Renal/GU Renal InsufficiencyRenal disease     Musculoskeletal   Abdominal   Peds  Hematology  (+) Blood dyscrasia, ,   Anesthesia Other Findings   Reproductive/Obstetrics                           Anesthesia Physical Anesthesia Plan  ASA: III  Anesthesia Plan: MAC   Post-op Pain Management:    Induction: Intravenous  Airway Management Planned: Nasal Cannula  Additional Equipment:   Intra-op Plan:   Post-operative Plan:   Informed Consent: I have reviewed the patients History and Physical, chart, labs and discussed the procedure including the risks, benefits and alternatives for the proposed anesthesia with the patient or authorized representative who has indicated his/her understanding and acceptance.     Plan Discussed with:   Anesthesia Plan Comments:         Anesthesia Quick Evaluation  

## 2013-06-08 NOTE — Op Note (Signed)
Date of Admission: 05/14/2013   Date of Surgery: 05/14/2013  Pre-Op Dx: Cataract  Right  Eye  Post-Op Dx: Cataract  Right  Eye,  Dx Code 366.19  Surgeon: Gemma Payor, M.D.  Assistants: None  Anesthesia: Topical with MAC  Indications: Painless, progressive loss of vision with compromise of daily activities.  Surgery: Cataract Extraction with Intraocular lens Implant Right Eye  Discription: The patient had dilating drops and viscous lidocaine placed into the left eye in the pre-op holding area. After transfer to the operating room, a time out was performed. The patient was then prepped and draped. Beginning with a 75 degree blade a paracentesis port was made at the surgeon's 2 o'clock position. The anterior chamber was then filled with 1% non-preserved lidocaine. This was followed by filling the anterior chamber with Provisc. A bent cystatome needle was used to create a continuous tear capsulotomy. Hydrodissection was performed with balanced salt solution on a Fine canula. The lens nucleus was then removed using the phacoemulsification handpiece. Residual cortex was removed with the I&A handpiece. The anterior chamber and capsular bag were refilled with Provisc. A posterior chamber intraocular lens was placed into the capsular bag with it's injector. The implant was positioned with the Kuglan hook. The Provisc was then removed from the anterior chamber and capsular bag with the I&A handpiece. Stromal hydration of the main incision and paracentesis port was performed with BSS on a Fine canula. The wounds were tested for leak which was negative. The patient tolerated the procedure well. There were no operative complications. The patient was then transferred to the recovery room in stable condition.  Complications: None  Specimen: None  EBL: None  Prosthetic device: B&L enVista, MX60.

## 2013-06-08 NOTE — Preoperative (Signed)
Beta Blockers   Reason not to administer Beta Blockers:Not Applicable 

## 2013-06-09 ENCOUNTER — Encounter (HOSPITAL_COMMUNITY): Payer: Self-pay | Admitting: Ophthalmology

## 2013-08-03 ENCOUNTER — Ambulatory Visit (INDEPENDENT_AMBULATORY_CARE_PROVIDER_SITE_OTHER): Payer: Medicare Other | Admitting: Internal Medicine

## 2013-08-03 ENCOUNTER — Encounter: Payer: Self-pay | Admitting: Internal Medicine

## 2013-08-03 VITALS — BP 120/80 | HR 72 | Ht 60.0 in | Wt 192.2 lb

## 2013-08-03 DIAGNOSIS — K449 Diaphragmatic hernia without obstruction or gangrene: Secondary | ICD-10-CM

## 2013-08-03 DIAGNOSIS — R1110 Vomiting, unspecified: Secondary | ICD-10-CM

## 2013-08-03 NOTE — Patient Instructions (Addendum)
You have been scheduled for a gastric emptying scan at [facility]  on 08/21/13 at 1:00pm. Please arrive at least 15 minutes prior to your appointment for registration. Please make certain not to have anything to eat or drink after midnight the night before your test. Hold all stomach medications (ex: Zofran, phenergan, Reglan) 48 hours prior to your test. If you need to reschedule your appointment, please contact radiology scheduling at [phone]. _____________________________________________________________________ A gastric-emptying study measures how long it takes for food to move through your stomach. There are several ways to measure stomach emptying. In the most common test, you eat food that contains a small amount of radioactive material. A scanner that detects the movement of the radioactive material is placed over your abdomen to monitor the rate at which food leaves your stomach. This test normally takes about 2 hours to complete. _____________________________________________________________________   Make sure and get a flu shot this year.  I appreciate the opportunity to care for you.

## 2013-08-03 NOTE — Progress Notes (Signed)
  Subjective:    Patient ID: QUANNA WITTKE, female    DOB: 1936/04/30, 77 y.o.   MRN: [id number]  HPI The patient is here because of persistent intermittent regurgitation and vomiting. This occurs when bending over or if she eats too much at times. She is on a PPI. No new issues but she has talked to Dr. Alwyn Ren and he told her to follow-up with me re: ? Of repairing hiatal hernia.  Medications, allergies, past medical history, past surgical history, family history and social history are reviewed and updated in the EMR.   Review of Systems As above    Objective:   Physical Exam Obese, elderly, NAD    Assessment & Plan:  Hiatal hernia - Plan: NM Gastric Emptying  Vomiting alone - Plan: NM Gastric Emptying  1. On UGIS only 15% of stomach in chest but on EGD the hernia looked large. So still think hiatal hernia sis problem but ought to check for gastric emptying problems. 2. If gastric emptying ok will make surgical referral to Dr. Johna Sheriff, her request and I agree he would be a good choice as surgical consultant.   [id number] Alwyn Ren, MD

## 2013-08-06 ENCOUNTER — Encounter: Payer: Self-pay | Admitting: Internal Medicine

## 2013-08-21 ENCOUNTER — Encounter (HOSPITAL_COMMUNITY)
Admission: RE | Admit: 2013-08-21 | Discharge: 2013-08-21 | Disposition: A | Discharge status: Home / Self Care | Payer: Medicare Other | Source: Ambulatory Visit | Attending: Internal Medicine | Admitting: Internal Medicine

## 2013-08-21 ENCOUNTER — Encounter (HOSPITAL_COMMUNITY): Payer: Self-pay

## 2013-08-21 DIAGNOSIS — K449 Diaphragmatic hernia without obstruction or gangrene: Secondary | ICD-10-CM

## 2013-08-21 DIAGNOSIS — R109 Unspecified abdominal pain: Secondary | ICD-10-CM | POA: Insufficient documentation

## 2013-08-21 DIAGNOSIS — R1110 Vomiting, unspecified: Secondary | ICD-10-CM

## 2013-08-21 MED ORDER — TECHNETIUM TC 99M SULFUR COLLOID
2.2000 | Freq: Once | INTRAVENOUS | Status: AC | PRN
  Administered 2013-08-21: 2.2 via ORAL

## 2013-08-24 ENCOUNTER — Other Ambulatory Visit: Payer: Self-pay

## 2013-08-24 DIAGNOSIS — K449 Diaphragmatic hernia without obstruction or gangrene: Secondary | ICD-10-CM

## 2013-08-24 NOTE — Progress Notes (Signed)
Quick Note:  Please let her know the stomach emptying is normal  We will make her an appointment with Dr. Johna Sheriff re: large hiatal hernia thought to be symptomatic (she knew this was plan)  I am ccing Dr. Johna Sheriff ______

## 2013-09-08 ENCOUNTER — Other Ambulatory Visit: Payer: Self-pay | Admitting: Internal Medicine

## 2013-09-08 NOTE — Telephone Encounter (Signed)
Citalopram refill sent to pharmacy.  

## 2013-09-24 ENCOUNTER — Encounter (INDEPENDENT_AMBULATORY_CARE_PROVIDER_SITE_OTHER): Payer: Self-pay | Admitting: General Surgery

## 2013-09-24 ENCOUNTER — Telehealth (INDEPENDENT_AMBULATORY_CARE_PROVIDER_SITE_OTHER): Payer: Self-pay | Admitting: General Surgery

## 2013-09-24 ENCOUNTER — Telehealth: Payer: Self-pay | Admitting: Internal Medicine

## 2013-09-24 ENCOUNTER — Ambulatory Visit (INDEPENDENT_AMBULATORY_CARE_PROVIDER_SITE_OTHER): Payer: Medicare Other | Admitting: General Surgery

## 2013-09-24 VITALS — BP 127/86 | HR 66 | Temp 97.7°F | Resp 14 | Ht 60.0 in | Wt 189.2 lb

## 2013-09-24 DIAGNOSIS — K219 Gastro-esophageal reflux disease without esophagitis: Secondary | ICD-10-CM

## 2013-09-24 DIAGNOSIS — K449 Diaphragmatic hernia without obstruction or gangrene: Secondary | ICD-10-CM

## 2013-09-24 NOTE — Progress Notes (Signed)
Subjective:   hiatal hernia, vomiting and reflux  Patient ID: Kylie Patrick, female   DOB: 03/28/1936, 77 y.o.   MRN: [id number]  HPI Patient is a very pleasant 77-year-old female followed by Dr. Hopper and referred by Dr. Gessner for consideration for hiatal hernia repair. The patient has had a known hiatal hernia and reflux symptoms for several years. She has noted definite worsening symptoms in the past 2 years. She has had typical heartburn and reflux/waterbrash but this has been reasonably well-controlled on proton pump inhibitors. However she is having gradually worsening problems with postprandial vomiting. She states that the several times a week she will eat and immediately feel pressure and nausea in her upper abdomen and within an hour has vomiting. This comes on immediately after eating. She will have some mild pain under both rib cage associated with this. She feels her symptoms are gradually worsening. She has had a thorough workup to date. Upper endoscopy was performed showing a fairly large hiatal hernia. Colonoscopy showed only sigmoid diverticulosis. EGD estimated I will hernia at about 10 cm. Upper GI series was performed which I've reviewed showing a moderate sized sliding hiatal hernia. She has had a gastric emptying study showing normal gastric emptying.  Past Medical History  Diagnosis Date  . Hyperlipidemia   . Hypertension     w/ LVH on ECHO  . Gastroenteritis     w/ renal insufficiency in context of protracted nausea & vomitting 2006  . Tonsillitis 2006  . Colon polyp 2003  . GERD (gastroesophageal reflux disease)    Past Surgical History  Procedure Laterality Date  . Appendectomy    . Cholecystectomy  1970  . Abdominal hysterectomy      BSO for endometriosis and bengin tumor  . Colonoscopy w/ polypectomy  2003    Dr Gessner  . Esophagogastroduodenoscopy  02/05/13    Large Hiatal Hernia  . Breast lumpectomy Left 70's    X 2  . Cataract extraction w/phaco Left  05/14/2013    Procedure: CATARACT EXTRACTION PHACO AND INTRAOCULAR LENS PLACEMENT (IOC);  Surgeon: Kerry Hunt, MD;  Location: [facility];  Service: Ophthalmology;  Laterality: Left;  CDE:9.76  . Cataract extraction w/phaco Right 06/08/2013    Procedure: CATARACT EXTRACTION PHACO AND INTRAOCULAR LENS PLACEMENT (IOC);  Surgeon: Kerry Hunt, MD;  Location: [facility];  Service: Ophthalmology;  Laterality: Right;  CDE 13.65   Current Outpatient Prescriptions  Medication Sig Dispense Refill  . aspirin 81 MG tablet Take 81 mg by mouth daily.        . Calcium in Bone Mineral Cmplx 350 MG MISC Take 1 each by mouth 3 (three) times daily.      . carvedilol (COREG) 25 MG tablet TAKE ONE TABLET BY MOUTH TWICE DAILY  180 tablet  3  . Cholecalciferol (VITAMIN D3) 2000 UNITS TABS Take by mouth daily.        . citalopram (CELEXA) 20 MG tablet TAKE 1 TABLET EVERY DAY  90 tablet  0  . ezetimibe-simvastatin (VYTORIN) 10-20 MG per tablet TAKE 1 TABLET BY MOUTH AT BEDTIME .  90 tablet  3  . folic acid (FOLVITE) 400 MCG tablet Take 400 mcg by mouth daily.        . furosemide (LASIX) 40 MG tablet Take 1 tablet (40 mg total) by mouth daily.  90 tablet  1  . gabapentin (NEURONTIN) 100 MG capsule 1 every 8 hrs prn  90 capsule  0  . Magnesium 400 MG CAPS   Take 400 mg by mouth daily.       . Omega-3 Fatty Acids (FISH OIL) 500 MG CAPS Take 500 mg by mouth daily.       . omeprazole-sodium bicarbonate (ZEGERID) 40-1100 MG per capsule Take 1 capsule by mouth 2 (two) times daily. Before breakfast and at bedtime  60 capsule  11  . valsartan (DIOVAN) 320 MG tablet TAKE 1 TABLET (320 MG TOTAL) BY MOUTH DAILY.  90 tablet  1   No current facility-administered medications for this visit.   Allergies  Allergen Reactions  . Chlorthalidone     01/09/13 creatinine 2.2  . Hydrochlorothiazide     Hypokalemia  . Losartan Potassium-Hctz     Hypokalemia  . Norvasc [Amlodipine Besylate]     Edema  . Raloxifene     HTN  . Rofecoxib     HTN    History  Substance Use Topics  . Smoking status: Never Smoker   . Smokeless tobacco: Never Used  . Alcohol Use: Yes     Comment: Wine-VERY RARELY     Review of Systems  Constitutional: Negative.   Respiratory: Negative.   Cardiovascular: Negative.   Gastrointestinal: Positive for nausea, vomiting and abdominal pain. Negative for blood in stool.  Neurological: Positive for headaches.  Psychiatric/Behavioral: The patient is nervous/anxious.        Objective:   Physical Exam BP 127/86  Pulse 66  Temp(Src) 97.7 F (36.5 C) (Temporal)  Resp 14  Ht 5' (1.524 m)  Wt 189 lb 3.2 oz (85.821 kg)  BMI 36.95 kg/m2 General: Alert, Moderately obese Caucasian female, in no distress Skin: Warm and dry without rash or infection. HEENT: No palpable masses or thyromegaly. Sclera nonicteric. Pupils equal round and reactive. Oropharynx clear. Lymph nodes: No cervical, supraclavicular, or inguinal nodes palpable. Lungs: Breath sounds clear and equal without increased work of breathing Cardiovascular: Regular rate and rhythm without murmur. No JVD or edema. Peripheral pulses intact. Abdomen: Nondistended. Large healed right upper quadrant Coker incision.Soft and nontender. No masses palpable. No organomegaly. No palpable hernias. Extremities: No edema or joint swelling or deformity. No chronic venous stasis changes. Neurologic: Alert and fully oriented. Gait normal.    Assessment:     77-year-old female with immediate postprandial pressure and nausea and vomiting with a documented fairly large hiatal hernia and otherwise negative endoscopy and normal gastric emptying. She also has significant chronic reflux symptoms but these are well-controlled on PPI. I suspect that she does have some moderate obstructive symptoms secondary to her hernia. I think the likelihood of relieving her postprandial symptoms with repair of her hiatal hernia is good. I discussed this all with the patient and her  husband. We discussed laparoscopic repair of her hiatal hernia with Nissen fundoplication. I would like to obtain a preoperative esophageal manometry. I will discuss this with Dr. Gessner. I told the patient that I would call her with the results of the manometry and then go ahead and get her scheduled for surgery. She was given information regarding the procedure and all her questions were answered. We discussed laparoscopic surgery and possible need for open surgery. We discussed risks of anesthetic complications, esophageal or gastric injury and long-term risks of stricture, gas bloat, recurrent hernia and recurrent symptoms.    Plan:     Preoperative esophageal manometry. Then schedule for laparoscopic repair of her hiatal hernia with Nissen fundoplication.      

## 2013-09-24 NOTE — Patient Instructions (Signed)
You will be contacted regarding scheduling your esophageal manometry. Once this is done I will call you with the results and we will proceed with surgery scheduling.

## 2013-09-24 NOTE — Telephone Encounter (Signed)
Patient is scheduled for a esophageal manometry 10/05/13 9:30 I have notified [facility] and mailed the patient her instructions.

## 2013-09-24 NOTE — Telephone Encounter (Signed)
Sherry with [facility] called to make [place] aware she has set patient up for esophageal manometry at [facility] on 10/05/13 at 9:30 am. They only do these tests on Mondays. She asks that we call the patient with the date/time and she is mailing instructions to patient today. Call her back with any questions. [phone].

## 2013-09-25 ENCOUNTER — Other Ambulatory Visit (INDEPENDENT_AMBULATORY_CARE_PROVIDER_SITE_OTHER): Payer: Self-pay | Admitting: General Surgery

## 2013-09-25 ENCOUNTER — Telehealth (INDEPENDENT_AMBULATORY_CARE_PROVIDER_SITE_OTHER): Payer: Self-pay

## 2013-09-25 NOTE — Telephone Encounter (Signed)
Called patient with appointment for Esophageal Manometry @ [facility] on 10/05/13 @ 9:30am.  Patient inquiring if surgery will be before the end of the year, patient advised that we may not be able to schedule her surgery before the end of the year but, we will do our best to try to schedule her surgery.  Patient verbalized understanding.

## 2013-10-04 MED ORDER — DEXTROSE 5 % IV SOLN
2.0000 g | INTRAVENOUS | Status: DC
  Filled 2013-10-04: qty 2

## 2013-10-05 ENCOUNTER — Encounter (HOSPITAL_COMMUNITY)
Admission: RE | Disposition: A | Discharge status: Home / Self Care | Payer: Self-pay | Source: Ambulatory Visit | Attending: Internal Medicine

## 2013-10-05 ENCOUNTER — Ambulatory Visit (HOSPITAL_COMMUNITY)
Admission: RE | Admit: 2013-10-05 | Discharge: 2013-10-05 | Disposition: A | Discharge status: Home / Self Care | Payer: Medicare Other | Source: Ambulatory Visit | Attending: Internal Medicine | Admitting: Internal Medicine

## 2013-10-05 ENCOUNTER — Other Ambulatory Visit: Payer: Self-pay

## 2013-10-05 DIAGNOSIS — K219 Gastro-esophageal reflux disease without esophagitis: Secondary | ICD-10-CM | POA: Insufficient documentation

## 2013-10-05 DIAGNOSIS — K449 Diaphragmatic hernia without obstruction or gangrene: Secondary | ICD-10-CM | POA: Insufficient documentation

## 2013-10-05 DIAGNOSIS — R1110 Vomiting, unspecified: Secondary | ICD-10-CM | POA: Insufficient documentation

## 2013-10-05 HISTORY — PX: ESOPHAGEAL MANOMETRY: SHX5429

## 2013-10-05 SURGERY — MANOMETRY, ESOPHAGUS
Anesthesia: Topical

## 2013-10-05 MED ORDER — OMEPRAZOLE-SODIUM BICARBONATE 40-1100 MG PO CAPS: 1.0000 | ORAL_CAPSULE | Freq: Two times a day (BID) | ORAL | Status: DC

## 2013-10-05 MED ORDER — LIDOCAINE VISCOUS 2 % MT SOLN
OROMUCOSAL | Status: AC
  Filled 2013-10-05: qty 15

## 2013-10-06 ENCOUNTER — Encounter (HOSPITAL_COMMUNITY): Payer: Self-pay | Admitting: Internal Medicine

## 2013-10-06 ENCOUNTER — Other Ambulatory Visit: Payer: Self-pay | Admitting: *Deleted

## 2013-10-06 MED ORDER — VALSARTAN 320 MG PO TABS: ORAL_TABLET | ORAL | Status: DC

## 2013-10-06 NOTE — Telephone Encounter (Signed)
Valsartan refilled per protocol

## 2013-10-12 NOTE — Op Note (Signed)
Esophageal Manometry  LES basal pressure normal at 33.4 mm Hg UES basal pressure normal at 62.1 mm Hg  LES proximal position 37 cm Esophageal Length 20.3 cm There is a hiatal hernia  Esophageal Motility normal with normal residual pressre UES pressures and function normal  Impression:  Normal esophageal manometry  Recommendations:  No contraindication to surgery for hiatal hernia

## 2013-10-15 ENCOUNTER — Encounter (HOSPITAL_COMMUNITY): Payer: Self-pay | Admitting: Pharmacy Technician

## 2013-10-19 ENCOUNTER — Encounter (HOSPITAL_COMMUNITY): Payer: Self-pay

## 2013-10-19 ENCOUNTER — Ambulatory Visit (HOSPITAL_COMMUNITY)
Admission: RE | Admit: 2013-10-19 | Discharge: 2013-10-19 | Disposition: A | Discharge status: Home / Self Care | Payer: Medicare Other | Source: Ambulatory Visit | Attending: General Surgery | Admitting: General Surgery

## 2013-10-19 ENCOUNTER — Encounter (HOSPITAL_COMMUNITY)
Admission: RE | Admit: 2013-10-19 | Discharge: 2013-10-19 | Disposition: A | Discharge status: Home / Self Care | Payer: Medicare Other | Source: Ambulatory Visit | Attending: General Surgery | Admitting: General Surgery

## 2013-10-19 DIAGNOSIS — K219 Gastro-esophageal reflux disease without esophagitis: Secondary | ICD-10-CM | POA: Insufficient documentation

## 2013-10-19 DIAGNOSIS — K449 Diaphragmatic hernia without obstruction or gangrene: Secondary | ICD-10-CM | POA: Insufficient documentation

## 2013-10-19 DIAGNOSIS — Z01812 Encounter for preprocedural laboratory examination: Secondary | ICD-10-CM | POA: Insufficient documentation

## 2013-10-19 DIAGNOSIS — Z01818 Encounter for other preprocedural examination: Secondary | ICD-10-CM | POA: Insufficient documentation

## 2013-10-19 DIAGNOSIS — I10 Essential (primary) hypertension: Secondary | ICD-10-CM | POA: Insufficient documentation

## 2013-10-19 HISTORY — DX: Anxiety disorder, unspecified: F41.9

## 2013-10-19 HISTORY — DX: Other complications of anesthesia, initial encounter: T88.59XA

## 2013-10-19 HISTORY — DX: Adverse effect of unspecified anesthetic, initial encounter: T41.45XA

## 2013-10-19 HISTORY — DX: Personal history of other diseases of the digestive system: Z87.19

## 2013-10-19 HISTORY — DX: Headache: R51

## 2013-10-19 LAB — CBC
HCT: 38.3 % (ref 36.0–46.0)
Hemoglobin: 11.9 g/dL — ABNORMAL LOW (ref 12.0–15.0)
RBC: 4.29 MIL/uL (ref 3.87–5.11)
WBC: 4.8 10*3/uL (ref 4.0–10.5)

## 2013-10-19 LAB — BASIC METABOLIC PANEL
BUN: 18 mg/dL (ref 6–23)
CO2: 30 mEq/L (ref 19–32)
Chloride: 103 mEq/L (ref 96–112)
Glucose, Bld: 97 mg/dL (ref 70–99)
Potassium: 3.5 mEq/L (ref 3.5–5.1)
Sodium: 140 mEq/L (ref 135–145)

## 2013-10-19 NOTE — Patient Instructions (Signed)
20 Kylie Patrick  10/19/2013   Your procedure is scheduled on: 10/22/13  Report to [facility] at 5:30 AM.  Call this number if you have problems the morning of surgery 336-: [phone]   Remember:   Do not eat food or drink liquids After Midnight.     Take these medicines the morning of surgery with A SIP OF WATER: carvedilol, zegerid, vytorin, celexa   Do not wear jewelry, make-up or nail polish.  Do not wear lotions, powders, or perfumes. You may wear deodorant.  Do not shave 48 hours prior to surgery. Men may shave face and neck.  Do not bring valuables to the hospital.  Contacts, dentures or bridgework may not be worn into surgery.  Leave suitcase in the car. After surgery it may be brought to your room.  For patients admitted to the hospital, checkout time is 11:00 AM the day of discharge.  Birdie Sons, RN  pre op nurse call if needed [phone]    FAILURE TO FOLLOW THESE INSTRUCTIONS MAY RESULT IN CANCELLATION OF YOUR SURGERY   Patient Signature: ___________________________________________

## 2013-10-19 NOTE — Progress Notes (Signed)
EKG 01/09/13 on EPIC

## 2013-10-22 ENCOUNTER — Ambulatory Visit (HOSPITAL_COMMUNITY): Payer: Medicare Other | Admitting: Anesthesiology

## 2013-10-22 ENCOUNTER — Inpatient Hospital Stay (HOSPITAL_COMMUNITY)
Admission: RE | Admit: 2013-10-22 | Discharge: 2013-10-26 | DRG: 328 | Disposition: A | Discharge status: Home / Self Care | Payer: Medicare Other | Source: Ambulatory Visit | Attending: General Surgery | Admitting: General Surgery

## 2013-10-22 ENCOUNTER — Encounter (HOSPITAL_COMMUNITY): Payer: Self-pay | Admitting: *Deleted

## 2013-10-22 ENCOUNTER — Encounter (HOSPITAL_COMMUNITY): Payer: Medicare Other | Admitting: Anesthesiology

## 2013-10-22 ENCOUNTER — Encounter (HOSPITAL_COMMUNITY)
Admission: RE | Disposition: A | Discharge status: Home / Self Care | Payer: Self-pay | Source: Ambulatory Visit | Attending: General Surgery

## 2013-10-22 DIAGNOSIS — K219 Gastro-esophageal reflux disease without esophagitis: Secondary | ICD-10-CM | POA: Diagnosis present

## 2013-10-22 DIAGNOSIS — K2100 Gastro-esophageal reflux disease with esophagitis, without bleeding: Secondary | ICD-10-CM

## 2013-10-22 DIAGNOSIS — R112 Nausea with vomiting, unspecified: Secondary | ICD-10-CM | POA: Diagnosis not present

## 2013-10-22 DIAGNOSIS — I10 Essential (primary) hypertension: Secondary | ICD-10-CM | POA: Diagnosis present

## 2013-10-22 DIAGNOSIS — Z79899 Other long term (current) drug therapy: Secondary | ICD-10-CM

## 2013-10-22 DIAGNOSIS — R1310 Dysphagia, unspecified: Secondary | ICD-10-CM | POA: Diagnosis present

## 2013-10-22 DIAGNOSIS — R7989 Other specified abnormal findings of blood chemistry: Secondary | ICD-10-CM | POA: Diagnosis present

## 2013-10-22 DIAGNOSIS — Z888 Allergy status to other drugs, medicaments and biological substances status: Secondary | ICD-10-CM

## 2013-10-22 DIAGNOSIS — K660 Peritoneal adhesions (postprocedural) (postinfection): Secondary | ICD-10-CM | POA: Diagnosis present

## 2013-10-22 DIAGNOSIS — K449 Diaphragmatic hernia without obstruction or gangrene: Principal | ICD-10-CM | POA: Diagnosis present

## 2013-10-22 DIAGNOSIS — Z7982 Long term (current) use of aspirin: Secondary | ICD-10-CM

## 2013-10-22 DIAGNOSIS — E785 Hyperlipidemia, unspecified: Secondary | ICD-10-CM | POA: Diagnosis present

## 2013-10-22 HISTORY — PX: LAPAROSCOPIC NISSEN FUNDOPLICATION: SHX1932

## 2013-10-22 SURGERY — FUNDOPLICATION, NISSEN, LAPAROSCOPIC
Anesthesia: General | Site: Abdomen | Laterality: Bilateral

## 2013-10-22 MED ORDER — PANTOPRAZOLE SODIUM 40 MG PO TBEC
40.0000 mg | DELAYED_RELEASE_TABLET | Freq: Every day | ORAL | Status: DC
  Administered 2013-10-22 – 2013-10-25 (×4): 40 mg via ORAL
  Filled 2013-10-22 (×5): qty 1

## 2013-10-22 MED ORDER — GABAPENTIN 100 MG PO CAPS
100.0000 mg | ORAL_CAPSULE | Freq: Three times a day (TID) | ORAL | Status: DC | PRN
  Filled 2013-10-22: qty 1

## 2013-10-22 MED ORDER — ONDANSETRON HCL 4 MG/2ML IJ SOLN
4.0000 mg | Freq: Four times a day (QID) | INTRAMUSCULAR | Status: DC | PRN
  Administered 2013-10-24: 4 mg via INTRAVENOUS
  Filled 2013-10-22: qty 2

## 2013-10-22 MED ORDER — LACTATED RINGERS IR SOLN
Status: DC | PRN
  Administered 2013-10-22: 1000 mL

## 2013-10-22 MED ORDER — LACTATED RINGERS IV SOLN
INTRAVENOUS | Status: DC | PRN
  Administered 2013-10-22 (×2): via INTRAVENOUS

## 2013-10-22 MED ORDER — ROCURONIUM BROMIDE 100 MG/10ML IV SOLN
INTRAVENOUS | Status: DC | PRN
  Administered 2013-10-22: 25 mg via INTRAVENOUS
  Administered 2013-10-22: 10 mg via INTRAVENOUS
  Administered 2013-10-22: 5 mg via INTRAVENOUS
  Administered 2013-10-22: 10 mg via INTRAVENOUS

## 2013-10-22 MED ORDER — LIDOCAINE HCL (CARDIAC) 20 MG/ML IV SOLN
INTRAVENOUS | Status: DC | PRN
  Administered 2013-10-22: 50 mg via INTRAVENOUS

## 2013-10-22 MED ORDER — BUPIVACAINE-EPINEPHRINE 0.5% -1:200000 IJ SOLN
INTRAMUSCULAR | Status: AC
  Filled 2013-10-22: qty 1

## 2013-10-22 MED ORDER — HYDROMORPHONE HCL PF 1 MG/ML IJ SOLN
INTRAMUSCULAR | Status: DC | PRN
  Administered 2013-10-22 (×2): 1 mg via INTRAVENOUS

## 2013-10-22 MED ORDER — ONDANSETRON HCL 4 MG/2ML IJ SOLN
INTRAMUSCULAR | Status: AC
  Filled 2013-10-22: qty 2

## 2013-10-22 MED ORDER — LIDOCAINE HCL (CARDIAC) 20 MG/ML IV SOLN
INTRAVENOUS | Status: AC
  Filled 2013-10-22: qty 5

## 2013-10-22 MED ORDER — CARVEDILOL 25 MG PO TABS
25.0000 mg | ORAL_TABLET | Freq: Two times a day (BID) | ORAL | Status: DC
  Administered 2013-10-22 – 2013-10-26 (×7): 25 mg via ORAL
  Filled 2013-10-22 (×10): qty 1

## 2013-10-22 MED ORDER — ROCURONIUM BROMIDE 100 MG/10ML IV SOLN
INTRAVENOUS | Status: AC
  Filled 2013-10-22: qty 1

## 2013-10-22 MED ORDER — PROMETHAZINE HCL 25 MG/ML IJ SOLN: 6.2500 mg | INTRAMUSCULAR | Status: DC | PRN

## 2013-10-22 MED ORDER — MIDAZOLAM HCL 5 MG/5ML IJ SOLN
INTRAMUSCULAR | Status: DC | PRN
  Administered 2013-10-22: 2 mg via INTRAVENOUS

## 2013-10-22 MED ORDER — IRBESARTAN 300 MG PO TABS
300.0000 mg | ORAL_TABLET | Freq: Every day | ORAL | Status: DC
  Administered 2013-10-23 – 2013-10-25 (×3): 300 mg via ORAL
  Filled 2013-10-22 (×4): qty 1

## 2013-10-22 MED ORDER — FENTANYL CITRATE 0.05 MG/ML IJ SOLN
INTRAMUSCULAR | Status: DC | PRN
  Administered 2013-10-22: 100 ug via INTRAVENOUS
  Administered 2013-10-22: 50 ug via INTRAVENOUS

## 2013-10-22 MED ORDER — NEOSTIGMINE METHYLSULFATE 1 MG/ML IJ SOLN
INTRAMUSCULAR | Status: AC
  Filled 2013-10-22: qty 10

## 2013-10-22 MED ORDER — EPHEDRINE SULFATE 50 MG/ML IJ SOLN
INTRAMUSCULAR | Status: AC
  Filled 2013-10-22: qty 1

## 2013-10-22 MED ORDER — SUCCINYLCHOLINE CHLORIDE 20 MG/ML IJ SOLN
INTRAMUSCULAR | Status: DC | PRN
  Administered 2013-10-22: 100 mg via INTRAVENOUS

## 2013-10-22 MED ORDER — DEXAMETHASONE SODIUM PHOSPHATE 10 MG/ML IJ SOLN
INTRAMUSCULAR | Status: DC | PRN
  Administered 2013-10-22: 10 mg via INTRAVENOUS

## 2013-10-22 MED ORDER — NEOSTIGMINE METHYLSULFATE 1 MG/ML IJ SOLN
INTRAMUSCULAR | Status: DC | PRN
  Administered 2013-10-22: 4 mg via INTRAVENOUS

## 2013-10-22 MED ORDER — GLYCOPYRROLATE 0.2 MG/ML IJ SOLN
INTRAMUSCULAR | Status: DC | PRN
  Administered 2013-10-22: 0.2 mg via INTRAVENOUS
  Administered 2013-10-22: 0.6 mg via INTRAVENOUS

## 2013-10-22 MED ORDER — DEXAMETHASONE SODIUM PHOSPHATE 10 MG/ML IJ SOLN
INTRAMUSCULAR | Status: AC
  Filled 2013-10-22: qty 1

## 2013-10-22 MED ORDER — GLYCOPYRROLATE 0.2 MG/ML IJ SOLN
INTRAMUSCULAR | Status: AC
  Filled 2013-10-22: qty 3

## 2013-10-22 MED ORDER — HYDROMORPHONE HCL PF 2 MG/ML IJ SOLN
INTRAMUSCULAR | Status: AC
  Filled 2013-10-22: qty 1

## 2013-10-22 MED ORDER — ONDANSETRON HCL 4 MG/2ML IJ SOLN
INTRAMUSCULAR | Status: DC | PRN
  Administered 2013-10-22: 4 mg via INTRAVENOUS

## 2013-10-22 MED ORDER — PROPOFOL 10 MG/ML IV BOLUS
INTRAVENOUS | Status: AC
  Filled 2013-10-22: qty 20

## 2013-10-22 MED ORDER — HEPARIN SODIUM (PORCINE) 5000 UNIT/ML IJ SOLN
5000.0000 [IU] | Freq: Once | INTRAMUSCULAR | Status: AC
  Administered 2013-10-22: 5000 [IU] via SUBCUTANEOUS
  Filled 2013-10-22: qty 1

## 2013-10-22 MED ORDER — HYDROMORPHONE HCL PF 1 MG/ML IJ SOLN: 0.2500 mg | INTRAMUSCULAR | Status: DC | PRN

## 2013-10-22 MED ORDER — GLYCOPYRROLATE 0.2 MG/ML IJ SOLN
INTRAMUSCULAR | Status: AC
  Filled 2013-10-22: qty 1

## 2013-10-22 MED ORDER — HEPARIN SODIUM (PORCINE) 5000 UNIT/ML IJ SOLN
5000.0000 [IU] | Freq: Three times a day (TID) | INTRAMUSCULAR | Status: DC
  Administered 2013-10-22 – 2013-10-26 (×11): 5000 [IU] via SUBCUTANEOUS
  Filled 2013-10-22 (×16): qty 1

## 2013-10-22 MED ORDER — CEFAZOLIN SODIUM-DEXTROSE 2-3 GM-% IV SOLR
INTRAVENOUS | Status: DC | PRN
  Administered 2013-10-22: 2 g via INTRAVENOUS

## 2013-10-22 MED ORDER — OXYCODONE HCL 5 MG/5ML PO SOLN
5.0000 mg | ORAL | Status: DC | PRN
  Administered 2013-10-23 (×2): 10 mg via ORAL
  Filled 2013-10-22 (×2): qty 10

## 2013-10-22 MED ORDER — SUCCINYLCHOLINE CHLORIDE 20 MG/ML IJ SOLN
INTRAMUSCULAR | Status: AC
  Filled 2013-10-22: qty 1

## 2013-10-22 MED ORDER — BUPIVACAINE-EPINEPHRINE 0.5% -1:200000 IJ SOLN
INTRAMUSCULAR | Status: DC | PRN
  Administered 2013-10-22: 40 mL

## 2013-10-22 MED ORDER — CEFAZOLIN SODIUM-DEXTROSE 2-3 GM-% IV SOLR
INTRAVENOUS | Status: AC
  Filled 2013-10-22: qty 50

## 2013-10-22 MED ORDER — KCL IN DEXTROSE-NACL 20-5-0.9 MEQ/L-%-% IV SOLN
INTRAVENOUS | Status: DC
  Administered 2013-10-22 – 2013-10-23 (×2): via INTRAVENOUS
  Filled 2013-10-22 (×5): qty 1000

## 2013-10-22 MED ORDER — MORPHINE SULFATE 2 MG/ML IJ SOLN
2.0000 mg | INTRAMUSCULAR | Status: DC | PRN
Start: 2013-10-22 — End: 2013-10-26
  Administered 2013-10-22: 2 mg via INTRAVENOUS
  Filled 2013-10-22: qty 1

## 2013-10-22 MED ORDER — MIDAZOLAM HCL 2 MG/2ML IJ SOLN
INTRAMUSCULAR | Status: AC
  Filled 2013-10-22: qty 2

## 2013-10-22 MED ORDER — BIOTENE DRY MOUTH MT LIQD
15.0000 mL | Freq: Two times a day (BID) | OROMUCOSAL | Status: DC
  Administered 2013-10-22 – 2013-10-26 (×6): 15 mL via OROMUCOSAL

## 2013-10-22 MED ORDER — FENTANYL CITRATE 0.05 MG/ML IJ SOLN
INTRAMUSCULAR | Status: AC
  Filled 2013-10-22: qty 5

## 2013-10-22 MED ORDER — EPHEDRINE SULFATE 50 MG/ML IJ SOLN
INTRAMUSCULAR | Status: DC | PRN
  Administered 2013-10-22: 5 mg via INTRAVENOUS

## 2013-10-22 MED ORDER — ONDANSETRON HCL 4 MG PO TABS: 4.0000 mg | ORAL_TABLET | Freq: Four times a day (QID) | ORAL | Status: DC | PRN

## 2013-10-22 MED ORDER — LACTATED RINGERS IV SOLN: INTRAVENOUS | Status: DC

## 2013-10-22 MED ORDER — FUROSEMIDE 40 MG PO TABS
40.0000 mg | ORAL_TABLET | Freq: Every morning | ORAL | Status: DC
  Administered 2013-10-23 – 2013-10-25 (×3): 40 mg via ORAL
  Filled 2013-10-22 (×4): qty 1

## 2013-10-22 MED ORDER — PROPOFOL 10 MG/ML IV BOLUS
INTRAVENOUS | Status: DC | PRN
  Administered 2013-10-22: 160 mg via INTRAVENOUS

## 2013-10-22 SURGICAL SUPPLY — 47 items
APPLIER CLIP 5 13 M/L LIGAMAX5 (MISCELLANEOUS)
APPLIER CLIP ROT 10 11.4 M/L (STAPLE)
BENZOIN TINCTURE PRP APPL 2/3 (GAUZE/BANDAGES/DRESSINGS) IMPLANT
CANISTER SUCTION 2500CC (MISCELLANEOUS) ×2 IMPLANT
CLIP APPLIE 5 13 M/L LIGAMAX5 (MISCELLANEOUS) IMPLANT
CLIP APPLIE ROT 10 11.4 M/L (STAPLE) IMPLANT
DECANTER SPIKE VIAL GLASS SM (MISCELLANEOUS) ×2 IMPLANT
DERMABOND ADVANCED (GAUZE/BANDAGES/DRESSINGS)
DERMABOND ADVANCED .7 DNX12 (GAUZE/BANDAGES/DRESSINGS) IMPLANT
DEVICE SUT QUICK LOAD TK 5 (STAPLE) ×8 IMPLANT
DEVICE SUT TI-KNOT TK 5X26 (MISCELLANEOUS) ×2 IMPLANT
DEVICE SUTURE ENDOST 10MM (ENDOMECHANICALS) ×2 IMPLANT
DEVICE TROCAR PUNCTURE CLOSURE (ENDOMECHANICALS) IMPLANT
DISSECTOR BLUNT TIP ENDO 5MM (MISCELLANEOUS) IMPLANT
DRAIN PENROSE 18X1/2 LTX STRL (DRAIN) ×2 IMPLANT
DRAPE LAPAROSCOPIC ABDOMINAL (DRAPES) ×2 IMPLANT
ELECT REM PT RETURN 9FT ADLT (ELECTROSURGICAL) ×2
ELECTRODE REM PT RTRN 9FT ADLT (ELECTROSURGICAL) ×1 IMPLANT
FELT TEFLON 4 X1 (Mesh General) ×2 IMPLANT
GOWN PREVENTION PLUS LG XLONG (DISPOSABLE) IMPLANT
GOWN PREVENTION PLUS XXLARGE (GOWN DISPOSABLE) ×2 IMPLANT
GOWN STRL REIN XL XLG (GOWN DISPOSABLE) ×6 IMPLANT
KIT BASIN OR (CUSTOM PROCEDURE TRAY) ×2 IMPLANT
NS IRRIG 1000ML POUR BTL (IV SOLUTION) ×2 IMPLANT
PENCIL BUTTON HOLSTER BLD 10FT (ELECTRODE) ×2 IMPLANT
SCALPEL HARMONIC ACE (MISCELLANEOUS) ×2 IMPLANT
SET IRRIG TUBING LAPAROSCOPIC (IRRIGATION / IRRIGATOR) ×2 IMPLANT
SLEEVE ADV FIXATION 5X100MM (TROCAR) ×4 IMPLANT
SOLUTION ANTI FOG 6CC (MISCELLANEOUS) ×2 IMPLANT
STAPLER VISISTAT 35W (STAPLE) ×2 IMPLANT
STRIP CLOSURE SKIN 1/2X4 (GAUZE/BANDAGES/DRESSINGS) IMPLANT
SUT MNCRL AB 4-0 PS2 18 (SUTURE) ×2 IMPLANT
SUT SURGIDAC NAB ES-9 0 48 120 (SUTURE) ×8 IMPLANT
SUT VIC AB 2-0 SH 27 (SUTURE)
SUT VIC AB 2-0 SH 27X BRD (SUTURE) IMPLANT
TIP INNERVISION DETACH 40FR (MISCELLANEOUS) IMPLANT
TIP INNERVISION DETACH 50FR (MISCELLANEOUS) IMPLANT
TIP INNERVISION DETACH 56FR (MISCELLANEOUS) IMPLANT
TIPS INNERVISION DETACH 40FR (MISCELLANEOUS)
TOWEL OR 17X26 10 PK STRL BLUE (TOWEL DISPOSABLE) ×6 IMPLANT
TRAY FOLEY CATH 14FRSI W/METER (CATHETERS) ×2 IMPLANT
TRAY LAP CHOLE (CUSTOM PROCEDURE TRAY) ×2 IMPLANT
TROCAR ADV FIXATION 11X100MM (TROCAR) ×2 IMPLANT
TROCAR ADV FIXATION 5X100MM (TROCAR) ×2 IMPLANT
TROCAR XCEL NON-BLD 11X100MML (ENDOMECHANICALS) IMPLANT
TROCAR XCEL NON-BLD 5MMX100MML (ENDOMECHANICALS) IMPLANT
TUBING INSUFFLATION 10FT LAP (TUBING) ×2 IMPLANT

## 2013-10-22 NOTE — Transfer of Care (Signed)
Immediate Anesthesia Transfer of Care Note  Patient: Kylie Patrick  Procedure(s) Performed: Procedure(s): LAPAROSCOPIC NISSEN FUNDOPLICATION with hiatal hernia repair  (Bilateral)  Patient Location: PACU  Anesthesia Type:General  Level of Consciousness: awake, alert  and oriented  Airway & Oxygen Therapy: Patient Spontanous Breathing and Patient connected to face mask oxygen  Post-op Assessment: Report given to PACU RN and Post -op Vital signs reviewed and stable  Post vital signs: Reviewed and stable  Complications: No apparent anesthesia complications

## 2013-10-22 NOTE — Anesthesia Postprocedure Evaluation (Signed)
  Anesthesia Post-op Note  Patient: Kylie Patrick  Procedure(s) Performed: Procedure(s) (LRB): LAPAROSCOPIC NISSEN FUNDOPLICATION with hiatal hernia repair  (Bilateral)  Patient Location: PACU  Anesthesia Type: General  Level of Consciousness: awake and alert   Airway and Oxygen Therapy: Patient Spontanous Breathing  Post-op Pain: mild  Post-op Assessment: Post-op Vital signs reviewed, Patient's Cardiovascular Status Stable, Respiratory Function Stable, Patent Airway and No signs of Nausea or vomiting  Last Vitals:  Filed Vitals:   10/22/13 1145  BP: 122/51  Pulse: 64  Temp:   Resp: 14    Post-op Vital Signs: stable   Complications: No apparent anesthesia complications

## 2013-10-22 NOTE — Interval H&P Note (Signed)
History and Physical Interval Note:  10/22/2013 7:16 AM  Kylie Patrick  has presented today for surgery, with the diagnosis of gerd  hiatal hernia   The various methods of treatment have been discussed with the patient and family. After consideration of risks, benefits and other options for treatment, the patient has consented to  Procedure(s): LAPAROSCOPIC NISSEN FUNDOPLICATION with hiatal hernia repair  (Bilateral) as a surgical intervention .  The patient's history has been reviewed, patient examined, no change in status, stable for surgery.  I have reviewed the patient's chart and labs.  Questions were answered to the patient's satisfaction.     Candyce Gambino T

## 2013-10-22 NOTE — Anesthesia Preprocedure Evaluation (Addendum)
Anesthesia Evaluation  Patient identified by MRN, date of birth, ID band Patient awake    Reviewed: Allergy & Precautions, H&P , NPO status , Patient's Chart, lab work & pertinent test results  Airway Mallampati: II TM Distance: >3 FB Neck ROM: Full    Dental no notable dental hx.    Pulmonary neg pulmonary ROS,  breath sounds clear to auscultation  Pulmonary exam normal       Cardiovascular hypertension, Pt. on medications Rhythm:Regular Rate:Normal     Neuro/Psych negative neurological ROS  negative psych ROS   GI/Hepatic Neg liver ROS, GERD-  Medicated,  Endo/Other  negative endocrine ROS  Renal/GU Renal InsufficiencyRenal disease  negative genitourinary   Musculoskeletal negative musculoskeletal ROS (+)   Abdominal   Peds negative pediatric ROS (+)  Hematology negative hematology ROS (+)   Anesthesia Other Findings   Reproductive/Obstetrics negative OB ROS                          Anesthesia Physical Anesthesia Plan  ASA: II  Anesthesia Plan: General   Post-op Pain Management:    Induction: Intravenous and Rapid sequence  Airway Management Planned: Oral ETT  Additional Equipment:   Intra-op Plan:   Post-operative Plan: Extubation in OR  Informed Consent: I have reviewed the patients History and Physical, chart, labs and discussed the procedure including the risks, benefits and alternatives for the proposed anesthesia with the patient or authorized representative who has indicated his/her understanding and acceptance.   Dental advisory given  Plan Discussed with: CRNA and Surgeon  Anesthesia Plan Comments:         Anesthesia Quick Evaluation

## 2013-10-22 NOTE — H&P (View-Only) (Signed)
Subjective:   hiatal hernia, vomiting and reflux  Patient ID: Kylie Patrick, female   DOB: November 21, 1935, 77 y.o.   MRN: [id number]  HPI Patient is a very pleasant 77 year old female followed by Dr. Alwyn Ren and referred by Dr. Leone Payor for consideration for hiatal hernia repair. The patient has had a known hiatal hernia and reflux symptoms for several years. She has noted definite worsening symptoms in the past 2 years. She has had typical heartburn and reflux/waterbrash but this has been reasonably well-controlled on proton pump inhibitors. However she is having gradually worsening problems with postprandial vomiting. She states that the several times a week she will eat and immediately feel pressure and nausea in her upper abdomen and within an hour has vomiting. This comes on immediately after eating. She will have some mild pain under both rib cage associated with this. She feels her symptoms are gradually worsening. She has had a thorough workup to date. Upper endoscopy was performed showing a fairly large hiatal hernia. Colonoscopy showed only sigmoid diverticulosis. EGD estimated I will hernia at about 10 cm. Upper GI series was performed which I've reviewed showing a moderate sized sliding hiatal hernia. She has had a gastric emptying study showing normal gastric emptying.  Past Medical History  Diagnosis Date  . Hyperlipidemia   . Hypertension     w/ LVH on ECHO  . Gastroenteritis     w/ renal insufficiency in context of protracted nausea & vomitting 2006  . Tonsillitis 2006  . Colon polyp 2003  . GERD (gastroesophageal reflux disease)    Past Surgical History  Procedure Laterality Date  . Appendectomy    . Cholecystectomy  1970  . Abdominal hysterectomy      BSO for endometriosis and bengin tumor  . Colonoscopy w/ polypectomy  2003    Dr Leone Payor  . Esophagogastroduodenoscopy  02/05/13    Large Hiatal Hernia  . Breast lumpectomy Left 70's    X 2  . Cataract extraction w/phaco Left  05/14/2013    Procedure: CATARACT EXTRACTION PHACO AND INTRAOCULAR LENS PLACEMENT (IOC);  Surgeon: Gemma Payor, MD;  Location: [facility];  Service: Ophthalmology;  Laterality: Left;  CDE:9.76  . Cataract extraction w/phaco Right 06/08/2013    Procedure: CATARACT EXTRACTION PHACO AND INTRAOCULAR LENS PLACEMENT (IOC);  Surgeon: Gemma Payor, MD;  Location: [facility];  Service: Ophthalmology;  Laterality: Right;  CDE 13.65   Current Outpatient Prescriptions  Medication Sig Dispense Refill  . aspirin 81 MG tablet Take 81 mg by mouth daily.        . Calcium in Bone Mineral Cmplx 350 MG MISC Take 1 each by mouth 3 (three) times daily.      . carvedilol (COREG) 25 MG tablet TAKE ONE TABLET BY MOUTH TWICE DAILY  180 tablet  3  . Cholecalciferol (VITAMIN D3) 2000 UNITS TABS Take by mouth daily.        . citalopram (CELEXA) 20 MG tablet TAKE 1 TABLET EVERY DAY  90 tablet  0  . ezetimibe-simvastatin (VYTORIN) 10-20 MG per tablet TAKE 1 TABLET BY MOUTH AT BEDTIME .  90 tablet  3  . folic acid (FOLVITE) 400 MCG tablet Take 400 mcg by mouth daily.        . furosemide (LASIX) 40 MG tablet Take 1 tablet (40 mg total) by mouth daily.  90 tablet  1  . gabapentin (NEURONTIN) 100 MG capsule 1 every 8 hrs prn  90 capsule  0  . Magnesium 400 MG CAPS  Take 400 mg by mouth daily.       . Omega-3 Fatty Acids (FISH OIL) 500 MG CAPS Take 500 mg by mouth daily.       Marland Kitchen omeprazole-sodium bicarbonate (ZEGERID) 40-1100 MG per capsule Take 1 capsule by mouth 2 (two) times daily. Before breakfast and at bedtime  60 capsule  11  . valsartan (DIOVAN) 320 MG tablet TAKE 1 TABLET (320 MG TOTAL) BY MOUTH DAILY.  90 tablet  1   No current facility-administered medications for this visit.   Allergies  Allergen Reactions  . Chlorthalidone     01/09/13 creatinine 2.2  . Hydrochlorothiazide     Hypokalemia  . Losartan Potassium-Hctz     Hypokalemia  . Norvasc [Amlodipine Besylate]     Edema  . Raloxifene     HTN  . Rofecoxib     HTN    History  Substance Use Topics  . Smoking status: Never Smoker   . Smokeless tobacco: Never Used  . Alcohol Use: Yes     Comment: Wine-VERY RARELY     Review of Systems  Constitutional: Negative.   Respiratory: Negative.   Cardiovascular: Negative.   Gastrointestinal: Positive for nausea, vomiting and abdominal pain. Negative for blood in stool.  Neurological: Positive for headaches.  Psychiatric/Behavioral: The patient is nervous/anxious.        Objective:   Physical Exam BP 127/86  Pulse 66  Temp(Src) 97.7 F (36.5 C) (Temporal)  Resp 14  Ht 5' (1.524 m)  Wt 189 lb 3.2 oz (85.821 kg)  BMI 36.95 kg/m2 General: Alert, Moderately obese Caucasian female, in no distress Skin: Warm and dry without rash or infection. HEENT: No palpable masses or thyromegaly. Sclera nonicteric. Pupils equal round and reactive. Oropharynx clear. Lymph nodes: No cervical, supraclavicular, or inguinal nodes palpable. Lungs: Breath sounds clear and equal without increased work of breathing Cardiovascular: Regular rate and rhythm without murmur. No JVD or edema. Peripheral pulses intact. Abdomen: Nondistended. Large healed right upper quadrant Coker incision.Soft and nontender. No masses palpable. No organomegaly. No palpable hernias. Extremities: No edema or joint swelling or deformity. No chronic venous stasis changes. Neurologic: Alert and fully oriented. Gait normal.    Assessment:     77 year old female with immediate postprandial pressure and nausea and vomiting with a documented fairly large hiatal hernia and otherwise negative endoscopy and normal gastric emptying. She also has significant chronic reflux symptoms but these are well-controlled on PPI. I suspect that she does have some moderate obstructive symptoms secondary to her hernia. I think the likelihood of relieving her postprandial symptoms with repair of her hiatal hernia is good. I discussed this all with the patient and her  husband. We discussed laparoscopic repair of her hiatal hernia with Nissen fundoplication. I would like to obtain a preoperative esophageal manometry. I will discuss this with Dr. Leone Payor. I told the patient that I would call her with the results of the manometry and then go ahead and get her scheduled for surgery. She was given information regarding the procedure and all her questions were answered. We discussed laparoscopic surgery and possible need for open surgery. We discussed risks of anesthetic complications, esophageal or gastric injury and long-term risks of stricture, gas bloat, recurrent hernia and recurrent symptoms.    Plan:     Preoperative esophageal manometry. Then schedule for laparoscopic repair of her hiatal hernia with Nissen fundoplication.

## 2013-10-22 NOTE — Op Note (Signed)
Preoperative Diagnosis: gerd  hiatal hernia   Postoprative Diagnosis: gerd  hiatal hernia   Procedure: Procedure(s): LAPAROSCOPIC NISSEN FUNDOPLICATION with hiatal hernia repair    Surgeon: Glenna Fellows T   Assistants: Lendon Ka  Anesthesia:  General endotracheal anesthesia  Indications: patient is a 77 year old female with a several year history of worsening GERD symptoms with heartburn, reflux or water brash. These were initially controlled with medications but now is having significant breakthrough symptoms and also has developed episodes of dysphagia with vomiting or regurgitation. Workup has revealed a large approximately 10 cm hiatal hernia with reflux. Upper endoscopy is otherwise unremarkable and esophageal manometry was normal. We have recommended proceeding with laparoscopic Nissen fundoplication and hiatal hernia repair. The procedure and risks have been discussed extensively detailed elsewhere and the patient agrees.   Procedure Detail:  Patient was brought to the operating room, placed in the supine position on the operating table, and general endotracheal anesthesia induced. She received preoperative IV antibiotics. PAS were in place. The abdomen was widely sterilely prepped and draped. Patient time out was performed and the procedure verified. Access was obtained in the left upper quadrant midclavicular line with a 5 mm Optiview trocar and pneumoperitoneum established. There was no evidence of trocar injury. There were noted to be extensive omental adhesions in the right upper quadrant and epigastrium from previous open cholecystectomy. A second 5 mm trocar was placed in the lateral left abdomen. Extensive omental adhesions were then taken down from the anterior abdominal wall with the harmonic scalpel. The colon was somewhat tethered up to the anterior abdominal wall but not directly involved and was carefully identified and protected. The falciform ligament was taken down  as well to provide better exposure. After all the anterior bowel wall adhesions were lysed a 11 mm trocar was placed above the left of the umbilicus for camera, 11 mm trocar in the right upper quadrant midclavicular line and a 5 mm trocar laterally in the right upper quadrant. 5 mm subxiphoid site the Nathanson retractor was placed in the left lobe of the liver elevated with very good exposure of the stomach and hiatus. There was an obvious large hiatal hernia. The stomach was reduced. The gastrohepatic omentum was opened and this dissection carried along the anterior hiatus. The right crus was then dissected and the stomach and esophagus and the esophageal fat pad carefully dissected away from the right crus and the dissection continued down posteriorly to the base of the left crus and somewhat up the left crus as well. We then took down short gastric vessels and mobilized the fundus toward the left crus and the stomach and esophagus was completely mobilized away from the spleen and the left crus identified and fully dissected. At this point a Penrose drain was passed around the EG junction and using this for traction for further adhesions around the crura and to the esophagus up in the mediastinum were completely taken down until we had the entire hiatus completely dissected and defined and we had mobilized the esophagus nicely up in the mediastinum and had about 4 or 5 cm of the esophagus intra-abdominally without any tension. A posterior crural repair was then performed with 4 interrupted 0 Ethibond sutures. A 36 lighted bougie dilator was passed without difficulty into the stomach and completely filled the hiatal opening. A mobile portion of the fundus was chosen and passed behind the esophagus and a contiguous portion of the fundus identified on the left side that would come up for  360 wrap without undue tension. The wrap was constructed with 3-0 Ethibond sutures the first suture incorporating the  phrenoesophageal ligament and the anterior esophagus and the other 2 sutures incorporating the anterior esophagus creating a 3 cm wrap. The bougie dilator was then removed and the wrap was seen to be nice and floppy. The abdomen was thoroughly inspected for hemostasis or evidence of trocar injury and everything looked fine. All CO2 was evacuated after removing the Nathanson retractor under direct vision the trochars were removed. Skin incisions were closed with subcuticular Monocryl and Dermabond. Sponge needle and instrument counts were correct.    Findings: Large hiatal hernia and extensive adhesions in the right upper quadrant from previous open cholecystectomy  Estimated Blood Loss:  less than 100 mL         Drains: none  Blood Given: none          Specimens: none        Complications:  * No complications entered in OR log *         Disposition: PACU - hemodynamically stable.         Condition: stable

## 2013-10-23 ENCOUNTER — Encounter (HOSPITAL_COMMUNITY): Payer: Self-pay | Admitting: General Surgery

## 2013-10-23 LAB — CBC
HCT: 31.5 % — ABNORMAL LOW (ref 36.0–46.0)
Hemoglobin: 10.1 g/dL — ABNORMAL LOW (ref 12.0–15.0)
MCHC: 32.1 g/dL (ref 30.0–36.0)
RDW: 14.2 % (ref 11.5–15.5)
WBC: 8.4 10*3/uL (ref 4.0–10.5)

## 2013-10-23 LAB — BASIC METABOLIC PANEL
BUN: 16 mg/dL (ref 6–23)
CO2: 28 mEq/L (ref 19–32)
Chloride: 107 mEq/L (ref 96–112)
GFR calc Af Amer: 43 mL/min — ABNORMAL LOW (ref >90)
Potassium: 3.8 mEq/L (ref 3.5–5.1)

## 2013-10-23 NOTE — Care Management Note (Signed)
    Page 1 of 1   10/23/2013     10:24:54 AM   CARE MANAGEMENT NOTE 10/23/2013  Patient:  Kylie Patrick, Kylie Patrick   Account Number:  [id number]  Date Initiated:  10/23/2013  Documentation initiated by:  Lorenda Ishihara  Subjective/Objective Assessment:   77 yo female admitted s/p lap nissen fundoplication. PTA lived at home with spouse.     Action/Plan:   Home when stable   Anticipated DC Date:  10/26/2013   Anticipated DC Plan:  HOME/SELF CARE      DC Planning Services  CM consult      Choice offered to / List presented to:             Status of service:  Completed, signed off Medicare Important Message given?   (If response is "NO", the following Medicare IM given date fields will be blank) Date Medicare IM given:   Date Additional Medicare IM given:    Discharge Disposition:  HOME/SELF CARE  Per UR Regulation:  Reviewed for med. necessity/level of care/duration of stay  If discussed at Long Length of Stay Meetings, dates discussed:    Comments:

## 2013-10-23 NOTE — Progress Notes (Signed)
Patient ID: Kylie Patrick, female   DOB: Jun 14, 1936, 77 y.o.   MRN: [id number] 1 Day Post-Op  Subjective: No C/o. No nausea, minimal pain.  Has been walking  Objective: Vital signs in last 24 hours: Temp:  [94.6 F (34.8 C)-98.4 F (36.9 C)] 97.9 F (36.6 C) (12/12 0610) Pulse Rate:  [63-77] 66 (12/12 0610) Resp:  [8-21] 18 (12/12 0610) BP: (100-142)/(42-90) 123/81 mmHg (12/12 0610) SpO2:  [93 %-99 %] 99 % (12/12 0610) Weight:  [189 lb (85.73 kg)] 189 lb (85.73 kg) (12/11 1707) Last BM Date: 10/21/13  Intake/Output from previous day: 12/11 0701 - 12/12 0700 In: 2508.3 [I.V.:2508.3] Out: 650 [Urine:600; Blood:50] Intake/Output this shift:    General appearance: alert, cooperative and no distress GI: normal findings: soft, non-tender Incision/Wound: Clean and dry  Lab Results:   Recent Labs  10/23/13 0521  WBC 8.4  HGB 10.1*  HCT 31.5*  PLT 170   BMET  Recent Labs  10/23/13 0521  NA 141  K 3.8  CL 107  CO2 28  GLUCOSE 162*  BUN 16  CREATININE 1.35*  CALCIUM 8.8     Studies/Results: No results found.  Anti-infectives: Anti-infectives   None      Assessment/Plan: s/p Procedure(s): LAPAROSCOPIC NISSEN FUNDOPLICATION with hiatal hernia repair  Doing very well Start CL diet Mild elevated creatinine-recheck in AM    LOS: 1 day    Jayjay Littles T 10/23/2013

## 2013-10-24 LAB — CBC
HCT: 30 % — ABNORMAL LOW (ref 36.0–46.0)
Hemoglobin: 9.6 g/dL — ABNORMAL LOW (ref 12.0–15.0)
MCH: 28.8 pg (ref 26.0–34.0)
MCHC: 32 g/dL (ref 30.0–36.0)
MCV: 90.1 fL (ref 78.0–100.0)

## 2013-10-24 LAB — BASIC METABOLIC PANEL
BUN: 16 mg/dL (ref 6–23)
Chloride: 104 mEq/L (ref 96–112)
Glucose, Bld: 94 mg/dL (ref 70–99)
Potassium: 3.6 mEq/L (ref 3.5–5.1)

## 2013-10-24 MED ORDER — OXYCODONE HCL 5 MG/5ML PO SOLN: 5.0000 mg | ORAL | Status: DC | PRN

## 2013-10-24 NOTE — Progress Notes (Signed)
Dr. Maisie Fus aware via phone pt recently had nausea. Pt reported " I vomited up a small amt of green, slimy stuff in my basin." Husband flushed so contents unobserved by nurse. See new orders to cancel discharge and decrease diet to clears.

## 2013-10-24 NOTE — Discharge Summary (Signed)
Physician Discharge Summary  Patient ID: Kylie Patrick MRN: [id number] DOB/AGE: 1935/11/26 77 y.o.  Admit date: 10/22/2013 Discharge date: 10/24/2013  Admission Diagnoses: gerd  hiatal hernia    Discharge Diagnoses: Hiatal Hernia Active Problems:   GERD (gastroesophageal reflux disease)   Discharged Condition: good  Hospital Course: The patient was admitted after laparoscopic Nissen procedure.  She did well post operatively.  Her diet was advanced slowly.  She was discharged home once she was tolerating a liquid diet and PO narcotics.    Consults: None  Significant Diagnostic Studies: labs: cbc, chem  Treatments: IV hydration, analgesia: acetaminophen w/ codeine and surgery: see above  Discharge Exam: Blood pressure 128/79, pulse 67, temperature 97.8 F (36.6 C), temperature source Oral, resp. rate 18, height 5' (1.524 m), weight 189 lb (85.73 kg), SpO2 93.00%. General appearance: alert and cooperative GI: normal findings: soft, non-tender Incision/Wound: clean, dry, intact  Disposition: 01-Home or Self Care     Medication List         aspirin 81 MG tablet  Take 81 mg by mouth daily.     Calcium in Bone Mineral Cmplx 350 MG Misc  Take 1-2 each by mouth 2 (two) times daily. Takes 1 in the morning and 2 at night     carvedilol 25 MG tablet  Commonly known as:  COREG  Take 25 mg by mouth 2 (two) times daily with a meal.     citalopram 20 MG tablet  Commonly known as:  CELEXA  Take 20 mg by mouth every morning.     ezetimibe-simvastatin 10-20 MG per tablet  Commonly known as:  VYTORIN  Take 1 tablet by mouth at bedtime.     Fish Oil 500 MG Caps  Take 500 mg by mouth daily.     folic acid 400 MCG tablet  Commonly known as:  FOLVITE  Take 400 mcg by mouth every morning.     furosemide 40 MG tablet  Commonly known as:  LASIX  Take 40 mg by mouth every morning.     gabapentin 100 MG capsule  Commonly known as:  NEURONTIN  Take 100 mg by mouth every 8  (eight) hours as needed (pain).     Magnesium 400 MG Caps  Take 400 mg by mouth daily.     omeprazole-sodium bicarbonate 40-1100 MG per capsule  Commonly known as:  ZEGERID  Take 1 capsule by mouth 2 (two) times daily. Before breakfast and at bedtime     oxyCODONE 5 MG/5ML solution  Commonly known as:  ROXICODONE  Take 5-10 mLs (5-10 mg total) by mouth every 4 (four) hours as needed for severe pain.     valsartan 320 MG tablet  Commonly known as:  DIOVAN  Take 320 mg by mouth every morning.     Vitamin D3 2000 UNITS Tabs  Take 1 tablet by mouth daily.           Follow-up Information   Follow up with HOXWORTH,BENJAMIN T, MD. Schedule an appointment as soon as possible for a visit in 14 days.   Specialty:  General Surgery   Contact information:   [address] [place] [address] [phone]       Signed: Vanita Panda 10/24/2013, 7:47 AM

## 2013-10-25 NOTE — Progress Notes (Signed)
3 Days Post-Op  Subjective: Pt's d/c cancelled yesterday due to nausea and emesis.  Feeling better this am on clears.  No nausea.  Having BM's and passing flatus.    Objective: Vital signs in last 24 hours: Temp:  [97.5 F (36.4 C)-98 F (36.7 C)] 97.5 F (36.4 C) (12/14 0530) Pulse Rate:  [53-60] 53 (12/14 0530) Resp:  [16-18] 18 (12/14 0530) BP: (104-167)/(64-86) 167/86 mmHg (12/14 0530) SpO2:  [93 %-98 %] 98 % (12/14 0530)   Intake/Output from previous day: 12/13 0701 - 12/14 0700 In: 1560 [P.O.:1560] Out: 700 [Urine:700] Intake/Output this shift:     General appearance: alert and cooperative GI: normal findings: soft, non-tender  Incision: healing well, no significant drainage, no significant erythema, slight bruising noted  Lab Results:   Recent Labs  10/23/13 0521 10/24/13 0430  WBC 8.4 8.1  HGB 10.1* 9.6*  HCT 31.5* 30.0*  PLT 170 165   BMET  Recent Labs  10/23/13 0521 10/24/13 0430  NA 141 139  K 3.8 3.6  CL 107 104  CO2 28 29  GLUCOSE 162* 94  BUN 16 16  CREATININE 1.35* 1.50*  CALCIUM 8.8 8.6   PT/INR No results found for this basename: LABPROT, INR,  in the last 72 hours ABG No results found for this basename: PHART, PCO2, PO2, HCO3,  in the last 72 hours  MEDS, Scheduled . antiseptic oral rinse  15 mL Mouth Rinse BID  . carvedilol  25 mg Oral BID WC  . furosemide  40 mg Oral q morning - 10a  . heparin  5,000 Units Subcutaneous Q8H  . irbesartan  300 mg Oral Daily  . pantoprazole  40 mg Oral Daily    Studies/Results: No results found.  Assessment: s/p Procedure(s): LAPAROSCOPIC NISSEN FUNDOPLICATION with hiatal hernia repair  Patient Active Problem List   Diagnosis Date Noted  . GERD (gastroesophageal reflux disease) 10/22/2013  . Hiatal hernia 08/03/2013  . Nonspecific abnormal electrocardiogram (ECG) (EKG) 01/09/2013  . Personal history of colonic polyps 01/09/2013  . RENAL DISEASE, CHRONIC, MILD 05/05/2008  . ANXIETY  STATE, UNSPECIFIED 04/20/2008  . NIGHT SWEATS 02/19/2008  . HYPERLIPIDEMIA 08/12/2007  . DISORDER, DYSMETABOLIC SYNDROME X 08/12/2007  . HYPERTENSION, ESSENTIAL NOS 08/12/2007  . ACID REFLUX DISEASE 08/12/2007    S/p lap Nissen  Plan: Advance diet to fulls If tolerates fulls without nausea, will d/c home   LOS: 3 days     .Vanita Panda, MD [facility], [place] [phone]   10/25/2013 8:19 AM

## 2013-10-25 NOTE — Progress Notes (Signed)
After pt ate cream of chicken soup at lunch she vomited up about a cup of it.  After she rested her stomach she ate some orange sherbert and also vomited up part of that. Notified MD on call. Obtained order to decrease diet back to clears.

## 2013-10-26 NOTE — Discharge Summary (Signed)
Patient ID: Kylie Patrick [id number] 77 y.o. 26-Nov-1935  10/22/2013  Discharge date and time: 10/26/2013   Admitting Physician: Glenna Fellows T  Discharge Physician: Glenna Fellows T  Admission Diagnoses: gerd  hiatal hernia   Discharge Diagnoses: same  Operations: Procedure(s): LAPAROSCOPIC NISSEN FUNDOPLICATION with hiatal hernia repair   Admission Condition: good  Discharged Condition: good  Indication for Admission: patient is a 77 year old female with a known large hiatal hernia doing recent months has developed steadily worsening symptoms of dysphagia as well as increasing reflux and heartburn. After extensive workup and discussion detailed elsewhere she is electively admitted for laparoscopic repair of her hernia and Nissen fundoplication.  Hospital Course: patient was admitted on the morning of her procedure and underwent an uneventful laparoscopic hiatal hernia repair Nissen fundoplication. On the first postoperative day she felt well with a benign abdomen and vital signs were stable. She was started on a clear liquid diet. On the second postoperative day she was advanced to a full liquid diet but developed several episodes of nausea and vomiting. She was therefore observed and made n.p.o. This quickly resolved and she was started on a clear liquid diet on the third postoperative day again which she tolerated well. On the day of discharge she has been on a liquid diet for 24 hours with no difficulty. Denies any nausea or pain. Abdomen is soft and nontender her wounds healing well. She was felt ready for discharge and will gradually advance to a pured diet at home.   Disposition: Home  Patient Instructions:    Medication List         aspirin 81 MG tablet  Take 81 mg by mouth daily.     Calcium in Bone Mineral Cmplx 350 MG Misc  Take 1-2 each by mouth 2 (two) times daily. Takes 1 in the morning and 2 at night     carvedilol 25 MG tablet  Commonly known  as:  COREG  Take 25 mg by mouth 2 (two) times daily with a meal.     citalopram 20 MG tablet  Commonly known as:  CELEXA  Take 20 mg by mouth every morning.     ezetimibe-simvastatin 10-20 MG per tablet  Commonly known as:  VYTORIN  Take 1 tablet by mouth at bedtime.     Fish Oil 500 MG Caps  Take 500 mg by mouth daily.     folic acid 400 MCG tablet  Commonly known as:  FOLVITE  Take 400 mcg by mouth every morning.     furosemide 40 MG tablet  Commonly known as:  LASIX  Take 40 mg by mouth every morning.     gabapentin 100 MG capsule  Commonly known as:  NEURONTIN  Take 100 mg by mouth every 8 (eight) hours as needed (pain).     Magnesium 400 MG Caps  Take 400 mg by mouth daily.     omeprazole-sodium bicarbonate 40-1100 MG per capsule  Commonly known as:  ZEGERID  Take 1 capsule by mouth 2 (two) times daily. Before breakfast and at bedtime     oxyCODONE 5 MG/5ML solution  Commonly known as:  ROXICODONE  Take 5-10 mLs (5-10 mg total) by mouth every 4 (four) hours as needed for severe pain.     valsartan 320 MG tablet  Commonly known as:  DIOVAN  Take 320 mg by mouth every morning.     Vitamin D3 2000 UNITS Tabs  Take 1 tablet by mouth daily.  Activity: activity as tolerated Diet: liquid or pured diet Wound Care: none needed  Follow-up:  With Dr. Johna Sheriff in 3 weeks.  Signed: Mariella Saa MD, FACS  10/26/2013, 7:47 AM

## 2013-10-26 NOTE — Progress Notes (Signed)
Patient was dressed and ready for d/c to home. Tolerating liquids with discomfort denied. Ambulatory with ease. Husband present in room during d/c instructions. Has belongings packed and husband checked room. Staff member to transfer to vehicle via wheelchair.

## 2013-10-26 NOTE — Progress Notes (Signed)
Patient ID: Kylie Patrick, female   DOB: March 01, 1936, 77 y.o.   MRN: [id number] 4 Days Post-Op  Subjective: No complaints today. No further nausea. Has been able to tolerate clear liquids without difficulty all day yesterday and this morning. No pain.  Objective: Vital signs in last 24 hours: Temp:  [97.6 F (36.4 C)-97.9 F (36.6 C)] 97.6 F (36.4 C) (12/15 0530) Pulse Rate:  [58-61] 61 (12/15 0530) Resp:  [18-20] 18 (12/15 0530) BP: (134-175)/(58-94) 169/94 mmHg (12/15 0530) SpO2:  [97 %-98 %] 98 % (12/15 0530) Last BM Date: 10/25/13  Intake/Output from previous day: 12/14 0701 - 12/15 0700 In: 1200 [P.O.:1200] Out: 200 [Urine:200] Intake/Output this shift:    General appearance: alert, cooperative and no distress GI: normal findings: soft, non-tender Incision/Wound: clean and dry without evidence of infection  Lab Results:   Recent Labs  10/24/13 0430  WBC 8.1  HGB 9.6*  HCT 30.0*  PLT 165   BMET  Recent Labs  10/24/13 0430  NA 139  K 3.6  CL 104  CO2 29  GLUCOSE 94  BUN 16  CREATININE 1.50*  CALCIUM 8.6     Studies/Results: No results found.  Anti-infectives: Anti-infectives   None      Assessment/Plan: s/p Procedure(s): LAPAROSCOPIC NISSEN FUNDOPLICATION with hiatal hernia repair  Doing well today without complication identified. Nausea resolved. Possibly secondary to anesthesia or pain medication. Okay for discharge today.   LOS: 4 days    Ariyon Mittleman T 10/26/2013

## 2013-10-30 ENCOUNTER — Encounter (INDEPENDENT_AMBULATORY_CARE_PROVIDER_SITE_OTHER): Payer: Medicare Other | Admitting: General Surgery

## 2013-11-13 ENCOUNTER — Encounter (INDEPENDENT_AMBULATORY_CARE_PROVIDER_SITE_OTHER): Payer: Self-pay | Admitting: General Surgery

## 2013-11-13 ENCOUNTER — Ambulatory Visit (INDEPENDENT_AMBULATORY_CARE_PROVIDER_SITE_OTHER): Payer: Medicare Other | Admitting: General Surgery

## 2013-11-13 VITALS — BP 127/81 | HR 77 | Temp 98.0°F | Resp 14 | Ht 60.0 in | Wt 178.0 lb

## 2013-11-13 DIAGNOSIS — Z09 Encounter for follow-up examination after completed treatment for conditions other than malignant neoplasm: Secondary | ICD-10-CM

## 2013-11-13 NOTE — Patient Instructions (Signed)
Okay to gradually begin solid foods. No activity limitations.

## 2013-11-13 NOTE — Progress Notes (Signed)
History: Patient returns for her first postoperative visit 3 weeks following laparoscopic Nissen fundoplication and hiatal hernia repair for severe reflux. She feels that she is getting along very well. She has tolerated her liquids diet without difficulty. No dysphagia or vomiting. At this point she is not having any regurgitation or reflux symptoms as she was having preoperatively.  Exam: BP 127/81  Pulse 77  Temp(Src) 98 F (36.7 C) (Temporal)  Resp 14  Ht 5' (1.524 m)  Wt 178 lb (80.74 kg)  BMI 34.76 kg/m2 General: Appears well Abdomen: Soft and nontender. Wounds healing well.  Assessment and plan: Doing well following antireflux surgery as above. She will cut back her Zegerid to once daily. Okay to begin solid foods gradually. Return in 6 weeks.

## 2013-11-20 ENCOUNTER — Other Ambulatory Visit: Payer: Self-pay | Admitting: Internal Medicine

## 2013-11-23 NOTE — Telephone Encounter (Signed)
Furosemide refilled per protocol. JG//CMA 

## 2013-12-04 ENCOUNTER — Other Ambulatory Visit: Payer: Self-pay | Admitting: Internal Medicine

## 2013-12-07 NOTE — Telephone Encounter (Signed)
Citalopram refilled per protocol. JG//CMA 

## 2013-12-24 ENCOUNTER — Ambulatory Visit (INDEPENDENT_AMBULATORY_CARE_PROVIDER_SITE_OTHER): Payer: Medicare Other | Admitting: General Surgery

## 2013-12-24 ENCOUNTER — Encounter (INDEPENDENT_AMBULATORY_CARE_PROVIDER_SITE_OTHER): Payer: Self-pay | Admitting: General Surgery

## 2013-12-24 VITALS — BP 130/82 | HR 68 | Temp 97.8°F | Resp 14 | Ht 60.0 in | Wt 181.6 lb

## 2013-12-24 DIAGNOSIS — Z09 Encounter for follow-up examination after completed treatment for conditions other than malignant neoplasm: Secondary | ICD-10-CM

## 2013-12-24 NOTE — Progress Notes (Signed)
History: Patient returns for followup about 6 weeks after laparoscopic Nissen fundoplication hiatal hernia repair for persistent reflux. She states she is doing very well. She tried to stop her Zegerid altogether but experienced a fair amount of belching at night and started one tablet again daily and does not have this currently. She was not actually having any heartburn symptoms. She feels markedly better than preop. No dysphagia or abdominal pain Exam: She appears well. Abdomen soft and nontender. Wounds are well-healed  Assessment and plan: doing very well following laparoscopic Nissen fundoplication with good symptom relief but still feel she needs one Zegerid per day. I told her to use this for the next few weeks and then try to wean off this medication. I will see her back in 3 months for long-term followup.

## 2014-01-25 ENCOUNTER — Other Ambulatory Visit: Payer: Self-pay | Admitting: Internal Medicine

## 2014-02-22 ENCOUNTER — Other Ambulatory Visit: Payer: Self-pay

## 2014-02-22 MED ORDER — EZETIMIBE-SIMVASTATIN 10-20 MG PO TABS: 1.0000 | ORAL_TABLET | Freq: Every day | ORAL | Status: DC

## 2014-03-05 ENCOUNTER — Other Ambulatory Visit: Payer: Self-pay | Admitting: Internal Medicine

## 2014-03-05 NOTE — Telephone Encounter (Signed)
Last ov with you on 05/2013 Med last filled 11/2013 #90

## 2014-03-05 NOTE — Telephone Encounter (Signed)
OK X1 

## 2014-03-10 ENCOUNTER — Encounter (INDEPENDENT_AMBULATORY_CARE_PROVIDER_SITE_OTHER): Payer: Self-pay | Admitting: General Surgery

## 2014-03-15 ENCOUNTER — Encounter (INDEPENDENT_AMBULATORY_CARE_PROVIDER_SITE_OTHER): Payer: Self-pay | Admitting: General Surgery

## 2014-03-22 ENCOUNTER — Other Ambulatory Visit: Payer: Self-pay

## 2014-03-22 MED ORDER — VALSARTAN 320 MG PO TABS: 320.0000 mg | ORAL_TABLET | Freq: Every morning | ORAL | Status: DC

## 2014-03-29 ENCOUNTER — Other Ambulatory Visit: Payer: Self-pay

## 2014-03-29 MED ORDER — CARVEDILOL 25 MG PO TABS: 25.0000 mg | ORAL_TABLET | Freq: Two times a day (BID) | ORAL | Status: DC

## 2014-04-21 ENCOUNTER — Ambulatory Visit (INDEPENDENT_AMBULATORY_CARE_PROVIDER_SITE_OTHER): Payer: Medicare Other | Admitting: General Surgery

## 2014-04-21 ENCOUNTER — Encounter (INDEPENDENT_AMBULATORY_CARE_PROVIDER_SITE_OTHER): Payer: Self-pay | Admitting: General Surgery

## 2014-04-21 VITALS — BP 118/82 | HR 60 | Temp 97.8°F | Resp 16 | Ht 60.0 in | Wt 180.8 lb

## 2014-04-21 DIAGNOSIS — Z09 Encounter for follow-up examination after completed treatment for conditions other than malignant neoplasm: Secondary | ICD-10-CM

## 2014-04-21 NOTE — Progress Notes (Signed)
Chief complaint: Followup Nissen fundoplication  History: Patient returns for more long-term followup after laparoscopic Nissen fundoplication for severe reflux. She states she is getting along quite well. She is pleased with the result and happy that she have the surgery. She notices some occasional very mild dysphagia with Telfa beats and has to chew very well. She does not have any reflux or vomiting. She still feels that she gets some gas and belching at night unless she takes one Zegerid at night and has continued this.  Exam: BP 118/82  Pulse 60  Temp(Src) 97.8 F (36.6 C) (Temporal)  Resp 16  Ht 5' (1.524 m)  Wt 180 lb 12.8 oz (82.01 kg)  BMI 35.31 kg/m2 General: Overweight Caucasian female in no distress Abdomen: Soft and nontender. Incisions well healed. No hernias.  Assessment and plan: Doing well following laparoscopic Nissen fundoplication with very good symptom relief and no evidence of complications. She is still on half her previous medication dose. I told her I would suggest she occasionally try to wean off of that to see if it is really necessary. At this point she's doing well enough I did not give her a return appointment she understands to call as needed for any concerns.

## 2014-04-24 ENCOUNTER — Other Ambulatory Visit: Payer: Self-pay | Admitting: Internal Medicine

## 2014-05-26 ENCOUNTER — Other Ambulatory Visit: Payer: Self-pay

## 2014-05-26 MED ORDER — FUROSEMIDE 40 MG PO TABS: 40.0000 mg | ORAL_TABLET | Freq: Every morning | ORAL | Status: DC

## 2014-06-14 ENCOUNTER — Ambulatory Visit (INDEPENDENT_AMBULATORY_CARE_PROVIDER_SITE_OTHER): Payer: Medicare Other | Admitting: Internal Medicine

## 2014-06-14 ENCOUNTER — Encounter: Payer: Self-pay | Admitting: Internal Medicine

## 2014-06-14 VITALS — BP 90/70 | HR 74 | Temp 98.2°F | Wt 183.0 lb

## 2014-06-14 DIAGNOSIS — R55 Syncope and collapse: Secondary | ICD-10-CM | POA: Insufficient documentation

## 2014-06-14 DIAGNOSIS — I10 Essential (primary) hypertension: Secondary | ICD-10-CM

## 2014-06-14 DIAGNOSIS — N182 Chronic kidney disease, stage 2 (mild): Secondary | ICD-10-CM

## 2014-06-14 DIAGNOSIS — E785 Hyperlipidemia, unspecified: Secondary | ICD-10-CM

## 2014-06-14 DIAGNOSIS — F411 Generalized anxiety disorder: Secondary | ICD-10-CM

## 2014-06-14 MED ORDER — EZETIMIBE-SIMVASTATIN 10-20 MG PO TABS: 1.0000 | ORAL_TABLET | Freq: Every day | ORAL | Status: DC

## 2014-06-14 MED ORDER — GABAPENTIN 100 MG PO CAPS: 100.0000 mg | ORAL_CAPSULE | Freq: Three times a day (TID) | ORAL | Status: DC | PRN

## 2014-06-14 MED ORDER — VALSARTAN 320 MG PO TABS: ORAL_TABLET | ORAL | Status: DC

## 2014-06-14 MED ORDER — CARVEDILOL 25 MG PO TABS: ORAL_TABLET | ORAL | Status: DC

## 2014-06-14 NOTE — Progress Notes (Signed)
Pre visit review using our clinic review tool, if applicable. No additional management support is needed unless otherwise documented below in the visit note. 

## 2014-06-14 NOTE — Progress Notes (Signed)
   Subjective:    Patient ID: Kylie Patrick, female    DOB: 1936-11-09, 78 y.o.   MRN: [id number]  HPI She is here for followup of her hypertension and for med refills.  She has been compliant with her blood pressure medicines. She believes she's had no adverse effects.  She has been restricting sodium and following a modified heart healthy diet.  She had weaned her citalopram over several months and is now completely off it. She does not believe she needs this medication. Appetite is good; she has chronic sleep issues. This is mainly difficulty going to sleep.  She had an episode of frank syncope 04/04/16. This occurred as she raised up after leaning over with waist flexion in the kitchen while cooking. She states that she heard her husband speaking but had fallen on the floor  & had difficulty moving initially. She strained her ankle requiring she wear a brace. There was no cardiac or neuro prodrome prior to the event. There was no seizure stigmata  She describes some mild dysphagia even though she has had hiatal hernia wrapping 10/22/13. Her sleep issues became worse after she had that  hiatal hernia surgery.  She has occasional frontal headaches for which she takes Tylenol. If that does not help she uses gabapentin.  She also has occasional palpitations at rest but not with exertion.    Review of Systems   She denies unexplained weight loss, significant dyspepsia, abdominal pain, melena, rectal bleeding.   Denied were any change in heart rhythm or rate prior to the event. There was no associated chest pain or shortness of breath .  Also specifically denied prior to the episode were headache, limb weakness, tingling, or numbness. No seizure activity noted.        Objective:   Physical Exam  Positive for significant findings include:  She has 2 large lipoma of the upper back. An S4 is noted without significant murmurs or gallops There is accentuation of the thoracic  curvature She has fusiform enlargement of the knees with marked crepitus, left greater than right.  General appearance :adequately nourished; in no distress. Eyes: No conjunctival inflammation or scleral icterus is present. Oral exam: Dental hygiene is good. Lips and gums are healthy appearing.There is no oropharyngeal erythema or exudate noted.  Heart:  Normal rate and regular rhythm. S1 and S2 normal without gallop, murmur, click, rub or other extra sounds   Lungs:Chest clear to auscultation; no wheezes, rhonchi,rales ,or rubs present.No increased work of breathing.  Abdomen: bowel sounds normal, soft and non-tender without masses, organomegaly or hernias noted.  No guarding or rebound. Skin:Warm & dry.  Intact without suspicious lesions or rashes ; no jaundice or tenting Lymphatic: No lymphadenopathy is noted about the head, neck, axilla DTRs, strength & tone normal.Oriented X3.             Assessment & Plan:  #1 hypertension; well controlled. BP actually very low.Carvedilol will be decreased to one half twice a day.  #2 syncopal episode, positionally related. There's been no recurrence.  #3 anxiety state resolved  #4 dysphagia, minor  #5 dyslipidemia, labs due  Plan: See orders recommendations

## 2014-06-14 NOTE — Patient Instructions (Signed)

## 2014-06-15 ENCOUNTER — Other Ambulatory Visit (INDEPENDENT_AMBULATORY_CARE_PROVIDER_SITE_OTHER): Payer: Medicare Other

## 2014-06-15 ENCOUNTER — Telehealth: Payer: Self-pay | Admitting: Internal Medicine

## 2014-06-15 DIAGNOSIS — E785 Hyperlipidemia, unspecified: Secondary | ICD-10-CM

## 2014-06-15 DIAGNOSIS — R55 Syncope and collapse: Secondary | ICD-10-CM

## 2014-06-15 DIAGNOSIS — I10 Essential (primary) hypertension: Secondary | ICD-10-CM

## 2014-06-15 LAB — HEPATIC FUNCTION PANEL
ALT: 18 U/L (ref 0–35)
AST: 22 U/L (ref 0–37)
Albumin: 3.6 g/dL (ref 3.5–5.2)
Alkaline Phosphatase: 67 U/L (ref 39–117)
Bilirubin, Direct: 0.1 mg/dL (ref 0.0–0.3)
Total Bilirubin: 0.7 mg/dL (ref 0.2–1.2)
Total Protein: 6.6 g/dL (ref 6.0–8.3)

## 2014-06-15 LAB — CBC WITH DIFFERENTIAL/PLATELET
Basophils Absolute: 0 K/uL (ref 0.0–0.1)
Basophils Relative: 0.6 % (ref 0.0–3.0)
Eosinophils Absolute: 0.3 K/uL (ref 0.0–0.7)
Eosinophils Relative: 4.8 % (ref 0.0–5.0)
HCT: 39 % (ref 36.0–46.0)
Hemoglobin: 12.8 g/dL (ref 12.0–15.0)
Lymphocytes Relative: 33.3 % (ref 12.0–46.0)
Lymphs Abs: 1.9 K/uL (ref 0.7–4.0)
MCHC: 32.7 g/dL (ref 30.0–36.0)
MCV: 87.6 fl (ref 78.0–100.0)
Monocytes Absolute: 0.7 K/uL (ref 0.1–1.0)
Monocytes Relative: 12.3 % — ABNORMAL HIGH (ref 3.0–12.0)
Neutro Abs: 2.8 K/uL (ref 1.4–7.7)
Neutrophils Relative %: 49 % (ref 43.0–77.0)
Platelets: 211 K/uL (ref 150.0–400.0)
RBC: 4.45 Mil/uL (ref 3.87–5.11)
RDW: 14.9 % (ref 11.5–15.5)
WBC: 5.7 K/uL (ref 4.0–10.5)

## 2014-06-15 LAB — LIPID PANEL
CHOLESTEROL: 148 mg/dL (ref 0–200)
HDL: 50.1 mg/dL (ref >39.00)
LDL Cholesterol: 83 mg/dL (ref 0–99)
NonHDL: 97.9
TRIGLYCERIDES: 77 mg/dL (ref 0.0–149.0)
Total CHOL/HDL Ratio: 3
VLDL: 15.4 mg/dL (ref 0.0–40.0)

## 2014-06-15 LAB — BASIC METABOLIC PANEL
BUN: 20 mg/dL (ref 6–23)
CHLORIDE: 106 meq/L (ref 96–112)
CO2: 30 mEq/L (ref 19–32)
Calcium: 9 mg/dL (ref 8.4–10.5)
Creatinine, Ser: 1.4 mg/dL — ABNORMAL HIGH (ref 0.4–1.2)
GFR: 39.5 mL/min — ABNORMAL LOW (ref >60.00)
Glucose, Bld: 95 mg/dL (ref 70–99)
POTASSIUM: 3.9 meq/L (ref 3.5–5.1)
Sodium: 142 mEq/L (ref 135–145)

## 2014-06-15 LAB — TSH: TSH: 1.93 u[IU]/mL (ref 0.35–4.50)

## 2014-06-15 NOTE — Telephone Encounter (Signed)
Relevant patient education mailed to patient.  

## 2014-07-21 ENCOUNTER — Other Ambulatory Visit: Payer: Self-pay | Admitting: Internal Medicine

## 2014-09-21 ENCOUNTER — Other Ambulatory Visit: Payer: Self-pay | Admitting: Internal Medicine

## 2014-10-04 ENCOUNTER — Encounter: Payer: Self-pay | Admitting: Internal Medicine

## 2014-10-04 ENCOUNTER — Ambulatory Visit (INDEPENDENT_AMBULATORY_CARE_PROVIDER_SITE_OTHER): Payer: Medicare Other | Admitting: Internal Medicine

## 2014-10-04 ENCOUNTER — Ambulatory Visit (INDEPENDENT_AMBULATORY_CARE_PROVIDER_SITE_OTHER): Payer: Medicare Other

## 2014-10-04 ENCOUNTER — Other Ambulatory Visit (INDEPENDENT_AMBULATORY_CARE_PROVIDER_SITE_OTHER): Payer: Medicare Other

## 2014-10-04 VITALS — BP 118/72 | HR 69 | Temp 97.9°F | Resp 13 | Ht 60.0 in | Wt 188.2 lb

## 2014-10-04 DIAGNOSIS — Z0000 Encounter for general adult medical examination without abnormal findings: Secondary | ICD-10-CM

## 2014-10-04 DIAGNOSIS — K2100 Gastro-esophageal reflux disease with esophagitis, without bleeding: Secondary | ICD-10-CM

## 2014-10-04 DIAGNOSIS — I10 Essential (primary) hypertension: Secondary | ICD-10-CM

## 2014-10-04 DIAGNOSIS — M810 Age-related osteoporosis without current pathological fracture: Secondary | ICD-10-CM

## 2014-10-04 DIAGNOSIS — Z23 Encounter for immunization: Secondary | ICD-10-CM

## 2014-10-04 DIAGNOSIS — E782 Mixed hyperlipidemia: Secondary | ICD-10-CM

## 2014-10-04 DIAGNOSIS — G5601 Carpal tunnel syndrome, right upper limb: Secondary | ICD-10-CM

## 2014-10-04 LAB — CBC WITH DIFFERENTIAL/PLATELET
Basophils Absolute: 0 10*3/uL (ref 0.0–0.1)
Basophils Relative: 0.7 % (ref 0.0–3.0)
EOS ABS: 0.3 10*3/uL (ref 0.0–0.7)
Eosinophils Relative: 4.4 % (ref 0.0–5.0)
HCT: 39.5 % (ref 36.0–46.0)
Hemoglobin: 12.6 g/dL (ref 12.0–15.0)
LYMPHS PCT: 29.3 % (ref 12.0–46.0)
Lymphs Abs: 1.7 10*3/uL (ref 0.7–4.0)
MCHC: 31.9 g/dL (ref 30.0–36.0)
MCV: 87.3 fl (ref 78.0–100.0)
Monocytes Absolute: 0.6 10*3/uL (ref 0.1–1.0)
Monocytes Relative: 10.7 % (ref 3.0–12.0)
NEUTROS PCT: 54.9 % (ref 43.0–77.0)
Neutro Abs: 3.2 10*3/uL (ref 1.4–7.7)
Platelets: 223 10*3/uL (ref 150.0–400.0)
RBC: 4.52 Mil/uL (ref 3.87–5.11)
RDW: 15.2 % (ref 11.5–15.5)
WBC: 5.9 10*3/uL (ref 4.0–10.5)

## 2014-10-04 LAB — BASIC METABOLIC PANEL
BUN: 20 mg/dL (ref 6–23)
CALCIUM: 9.2 mg/dL (ref 8.4–10.5)
CO2: 30 meq/L (ref 19–32)
CREATININE: 1.4 mg/dL — AB (ref 0.4–1.2)
Chloride: 105 mEq/L (ref 96–112)
GFR: 39.8 mL/min — ABNORMAL LOW (ref >60.00)
GLUCOSE: 103 mg/dL — AB (ref 70–99)
Potassium: 4 mEq/L (ref 3.5–5.1)
Sodium: 143 mEq/L (ref 135–145)

## 2014-10-04 LAB — HEPATIC FUNCTION PANEL
ALK PHOS: 71 U/L (ref 39–117)
ALT: 22 U/L (ref 0–35)
AST: 27 U/L (ref 0–37)
Albumin: 3.8 g/dL (ref 3.5–5.2)
BILIRUBIN DIRECT: 0.1 mg/dL (ref 0.0–0.3)
Total Bilirubin: 0.5 mg/dL (ref 0.2–1.2)
Total Protein: 6.6 g/dL (ref 6.0–8.3)

## 2014-10-04 LAB — LIPID PANEL
Cholesterol: 165 mg/dL (ref 0–200)
HDL: 52 mg/dL (ref >39.00)
LDL Cholesterol: 93 mg/dL (ref 0–99)
NONHDL: 113
TRIGLYCERIDES: 100 mg/dL (ref 0.0–149.0)
Total CHOL/HDL Ratio: 3
VLDL: 20 mg/dL (ref 0.0–40.0)

## 2014-10-04 LAB — TSH: TSH: 2.72 u[IU]/mL (ref 0.35–4.50)

## 2014-10-04 NOTE — Assessment & Plan Note (Signed)
CBC & dif  Anti reflux measures GI referral 

## 2014-10-04 NOTE — Progress Notes (Signed)
Pre visit review using our clinic review tool, if applicable. No additional management support is needed unless otherwise documented below in the visit note. 

## 2014-10-04 NOTE — Progress Notes (Signed)
Subjective:    Patient ID: Kylie Patrick, female    DOB: 03-06-1936, 78 y.o.   MRN: [id number]  HPI  [employer]HC/Medicare Wellness Visit: Psychosocial and medical history were reviewed as required by Medicare (history related to abuse, antisocial behavior , firearm risk). Social history: Caffeine: minimal , Alcohol: no, Tobacco use:no Exercise:no Personal safety/fall risk:no Limitations of activities of daily living:no Seatbelt/ smoke alarm use:yes Healthcare Power of Attorney/Living Will status: POA only ; discussed Ophthalmologic exam status:UTD Hearing evaluation status:not UTD Orientation: Oriented X 3 Memory and recall: good Math testing: good Depression/anxiety assessment: no Foreign travel history: never Immunization status for influenza/pneumonia/ shingles /tetanus:Prevnar 13 needed Transfusion history:no Preventive health care maintenance status: Colonoscopy/BMD/mammogram/Pap as per protocol/standard care:Mammogram & BMD due Dental care: every 6 mos Chart reviewed and updated. Active issues reviewed and addressed as documented below.  She has been compliant with her medications without adverse effects  She does have occasional dysphagia approximately every 2 weeks. Several weeks ago she had epigastric pain for several hours post [employer]. She is questioning seeing her gastroenterologist to rule out need for esophageal dilation.  She was slightly anemic after hiatal hernia surgery in December 2014. This resolved as of 8/15. She has no other active GI symptoms.  She denies exertional chest pain. She does have some exertional dyspnea on occasion. As noted she's not on a regular exercise program.  BP averages 131/78 @ home.    Review of Systems Unexplained weight loss,  significant dyspepsia, melena, rectal bleeding, or persistently small caliber stools are denied.  Chest pain, palpitations, tachycardia,  paroxysmal nocturnal dyspnea, claudication or edema are  absent.  She describes nocturnal numbness and tingling of the right hand. No significant myalgias. No memory issues of significance.       Objective:   Physical Exam Gen.: Healthy and well-nourished in appearance. Alert, appropriate and cooperative throughout exam. Appears younger than stated age  Head: Normocephalic without obvious abnormalities.  Eyes: No corneal or conjunctival inflammation noted. Pupils equal round reactive to light and accommodation. Extraocular motion intact.  Ears: External  ear exam reveals no significant lesions or deformities. Canals clear .TMs normal. Hearing is grossly normal bilaterally. Nose: External nasal exam reveals no deformity or inflammation. Nasal mucosa are pink and moist. No lesions or exudates noted.   Mouth: Oral mucosa and oropharynx reveal no lesions or exudates. Teeth in good repair. Neck: No deformities, masses, or tenderness noted. Range of motion & Thyroid normal. Lungs: Normal respiratory effort; chest expands symmetrically. Lungs are clear to auscultation without rales, wheezes, or increased work of breathing. Heart: Heart sounds distant.Normal rate and rhythm. Normal S1 and S2. No gallop, click, or rub. No murmur. Abdomen: Bowel sounds normal; abdomen soft and nontender. No masses, organomegaly or hernias noted. Genitalia:  as per Gyn                                  Musculoskeletal/extremities: Accentuated curvature of upper thoracic spine. No clubbing, cyanosis, edema, or significant extremity  deformity noted. Range of motion normal .Tone & strength normal. Hand joints normal Fingernail  health good. Able to lie down & sit up w/o help. Negative SLR bilaterally Vascular: Carotid, radial artery, dorsalis pedis and  posterior tibial pulses are  equal. Decreased pedal pulses.No bruits present. Neurologic: Alert and oriented x3. Deep tendon reflexes symmetrical and normal. Negative Tinel's RUE Gait normal  .   Skin: Intact without  suspicious lesions or rashes. Lymph: No cervical, axillary lymphadenopathy present. Psych: Mood and affect are normal. Normally interactive                                                                                        Assessment & Plan:  See Current Assessment & Plan in Problem List under specific DiagnosisThe labs will be reviewed and risks and options assessed. Written recommendations will be provided by mail or directly through My Chart.Further evaluation or change in medical therapy will be directed by those results. #2 Dysphagia #3 probable R CTS See Orders & AVS

## 2014-10-04 NOTE — Assessment & Plan Note (Signed)
Lipids, LFTs, TSH  

## 2014-10-04 NOTE — Assessment & Plan Note (Signed)
Blood pressure goals reviewed. BMET ( creat 1.4 in 8/15)

## 2014-10-04 NOTE — Patient Instructions (Signed)
Your next office appointment will be determined based upon review of your pending labs & BMD. Those instructions will be transmitted to you by mail Followup as needed for your acute issue. Please report any significant change in your symptoms. Reflux of gastric acid may be asymptomatic as this may occur mainly during sleep.The triggers for reflux  include stress; the "aspirin family" ; alcohol; peppermint; and caffeine (coffee, tea, cola, and chocolate). The aspirin family would include aspirin and the nonsteroidal agents such as ibuprofen &  Naproxen. Tylenol would not cause reflux. If having symptoms ; food & drink should be avoided for @ least 2 hours before going to bed.  Go to Web M.D. for information on Carpal Tunnel Syndrome. Sleep with a wrist splint @ night. If symptoms persist or progress; nerve conduction/EMG studies would be indicated. If these were abnormal, Hand Surgery referral would be indicated.

## 2014-10-09 ENCOUNTER — Encounter: Payer: Self-pay | Admitting: Internal Medicine

## 2014-10-09 DIAGNOSIS — R739 Hyperglycemia, unspecified: Secondary | ICD-10-CM | POA: Insufficient documentation

## 2014-10-11 ENCOUNTER — Telehealth: Payer: Self-pay

## 2014-10-11 ENCOUNTER — Other Ambulatory Visit (INDEPENDENT_AMBULATORY_CARE_PROVIDER_SITE_OTHER): Payer: Medicare Other

## 2014-10-11 DIAGNOSIS — R739 Hyperglycemia, unspecified: Secondary | ICD-10-CM

## 2014-10-11 LAB — HEMOGLOBIN A1C: Hgb A1c MFr Bld: 6.3 % (ref 4.6–6.5)

## 2014-10-11 NOTE — Telephone Encounter (Signed)
Request for add on has been faxed to lab 

## 2014-10-11 NOTE — Telephone Encounter (Signed)
-----   Message from Hendricks Limes, MD sent at 10/09/2014 10:02 AM EST ----- Please add A1c (R73.9) if not too late. Thanks, [employer]

## 2014-10-20 ENCOUNTER — Other Ambulatory Visit: Payer: Self-pay | Admitting: Internal Medicine

## 2014-10-20 ENCOUNTER — Ambulatory Visit (INDEPENDENT_AMBULATORY_CARE_PROVIDER_SITE_OTHER)
Admission: RE | Admit: 2014-10-20 | Discharge: 2014-10-20 | Disposition: A | Discharge status: Home / Self Care | Payer: Medicare Other | Source: Ambulatory Visit | Attending: Internal Medicine | Admitting: Internal Medicine

## 2014-10-20 DIAGNOSIS — M810 Age-related osteoporosis without current pathological fracture: Secondary | ICD-10-CM

## 2014-12-02 ENCOUNTER — Ambulatory Visit: Payer: Medicare Other | Admitting: Internal Medicine

## 2014-12-05 IMAGING — CR DG CHEST 2V
2 series · 2 of 2 positions shown · non-contrast
Comparison: None.

CLINICAL DATA: Preop hiatal hernia repair.  Hypertension.

EXAM:
CHEST  2 VIEW

[w chest pa]
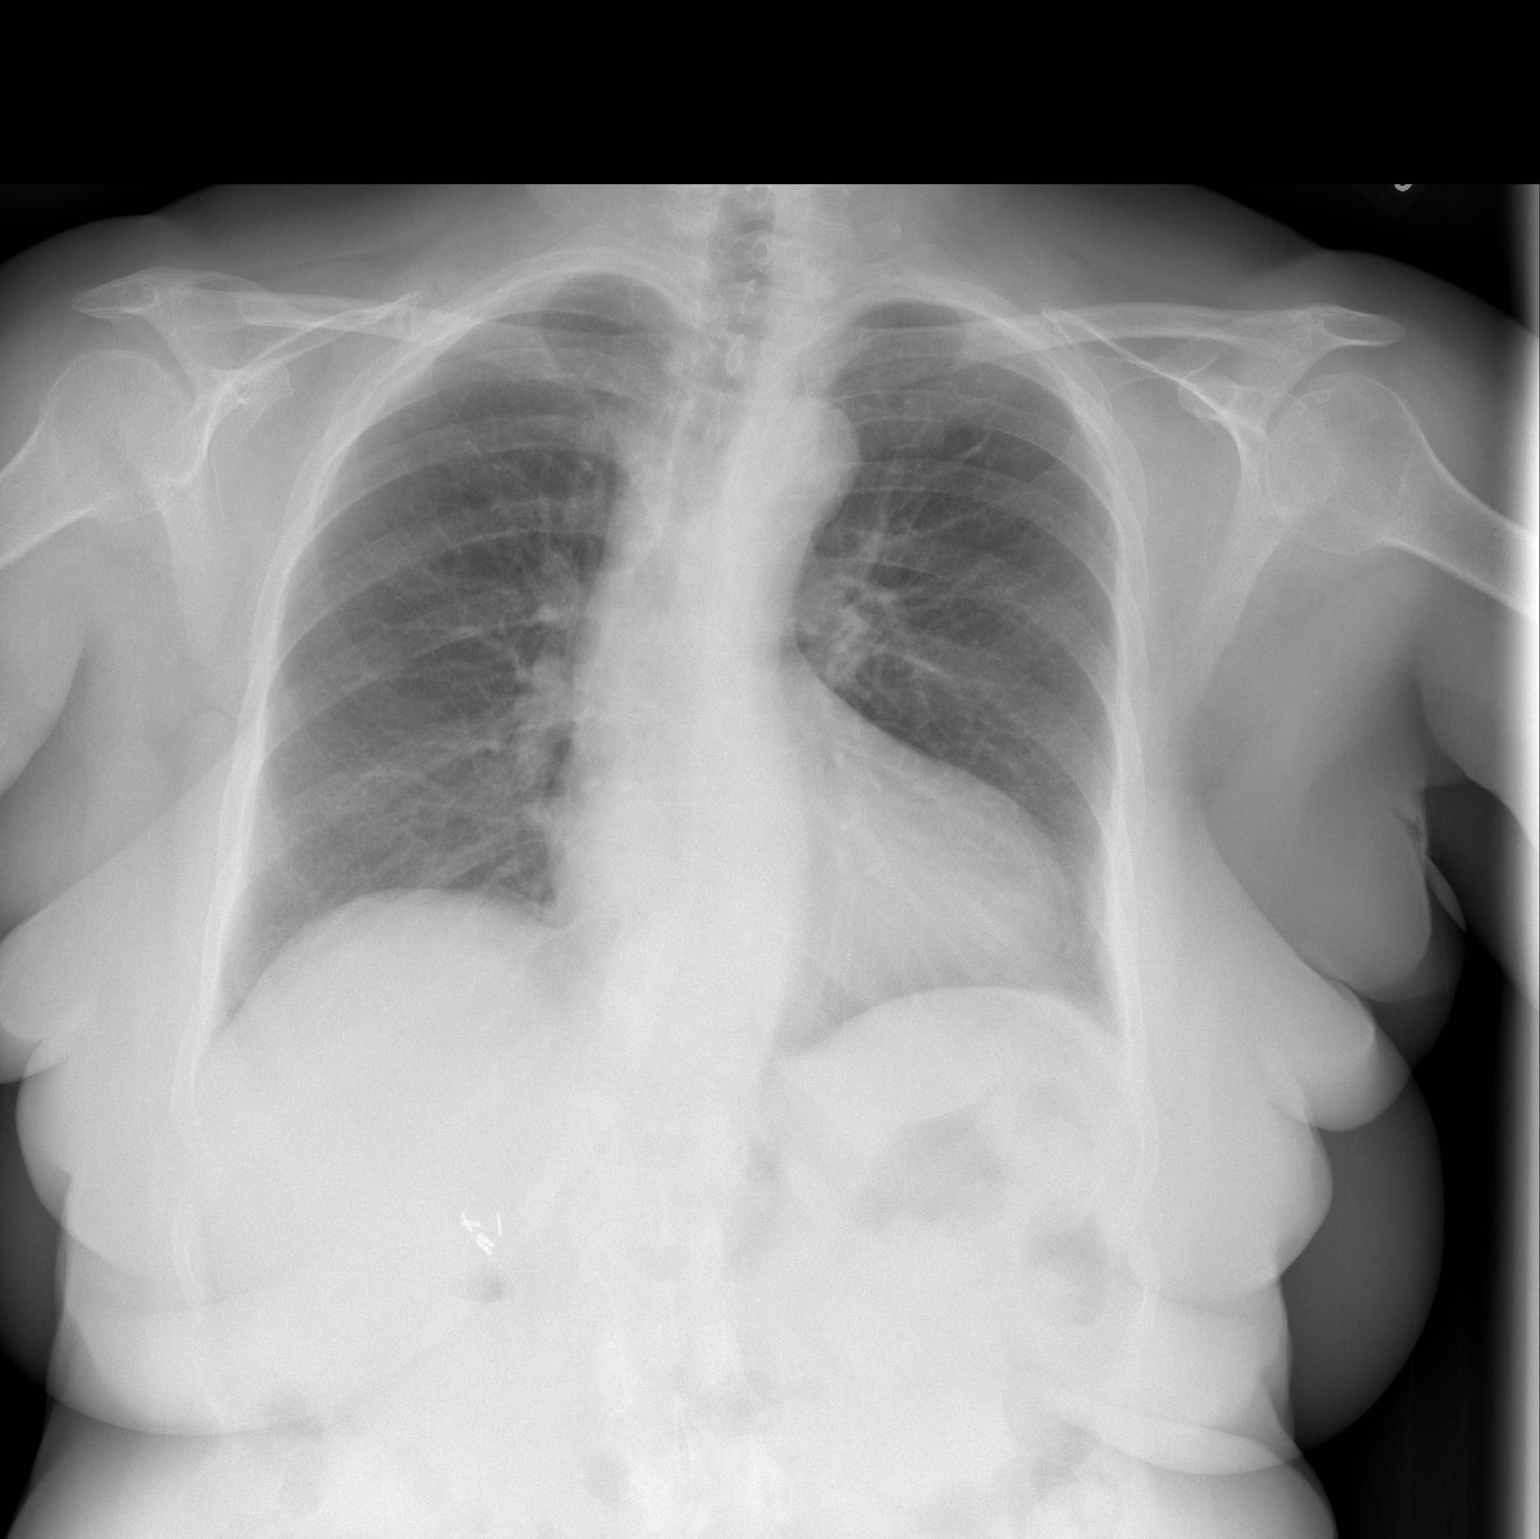

[w chest lat]
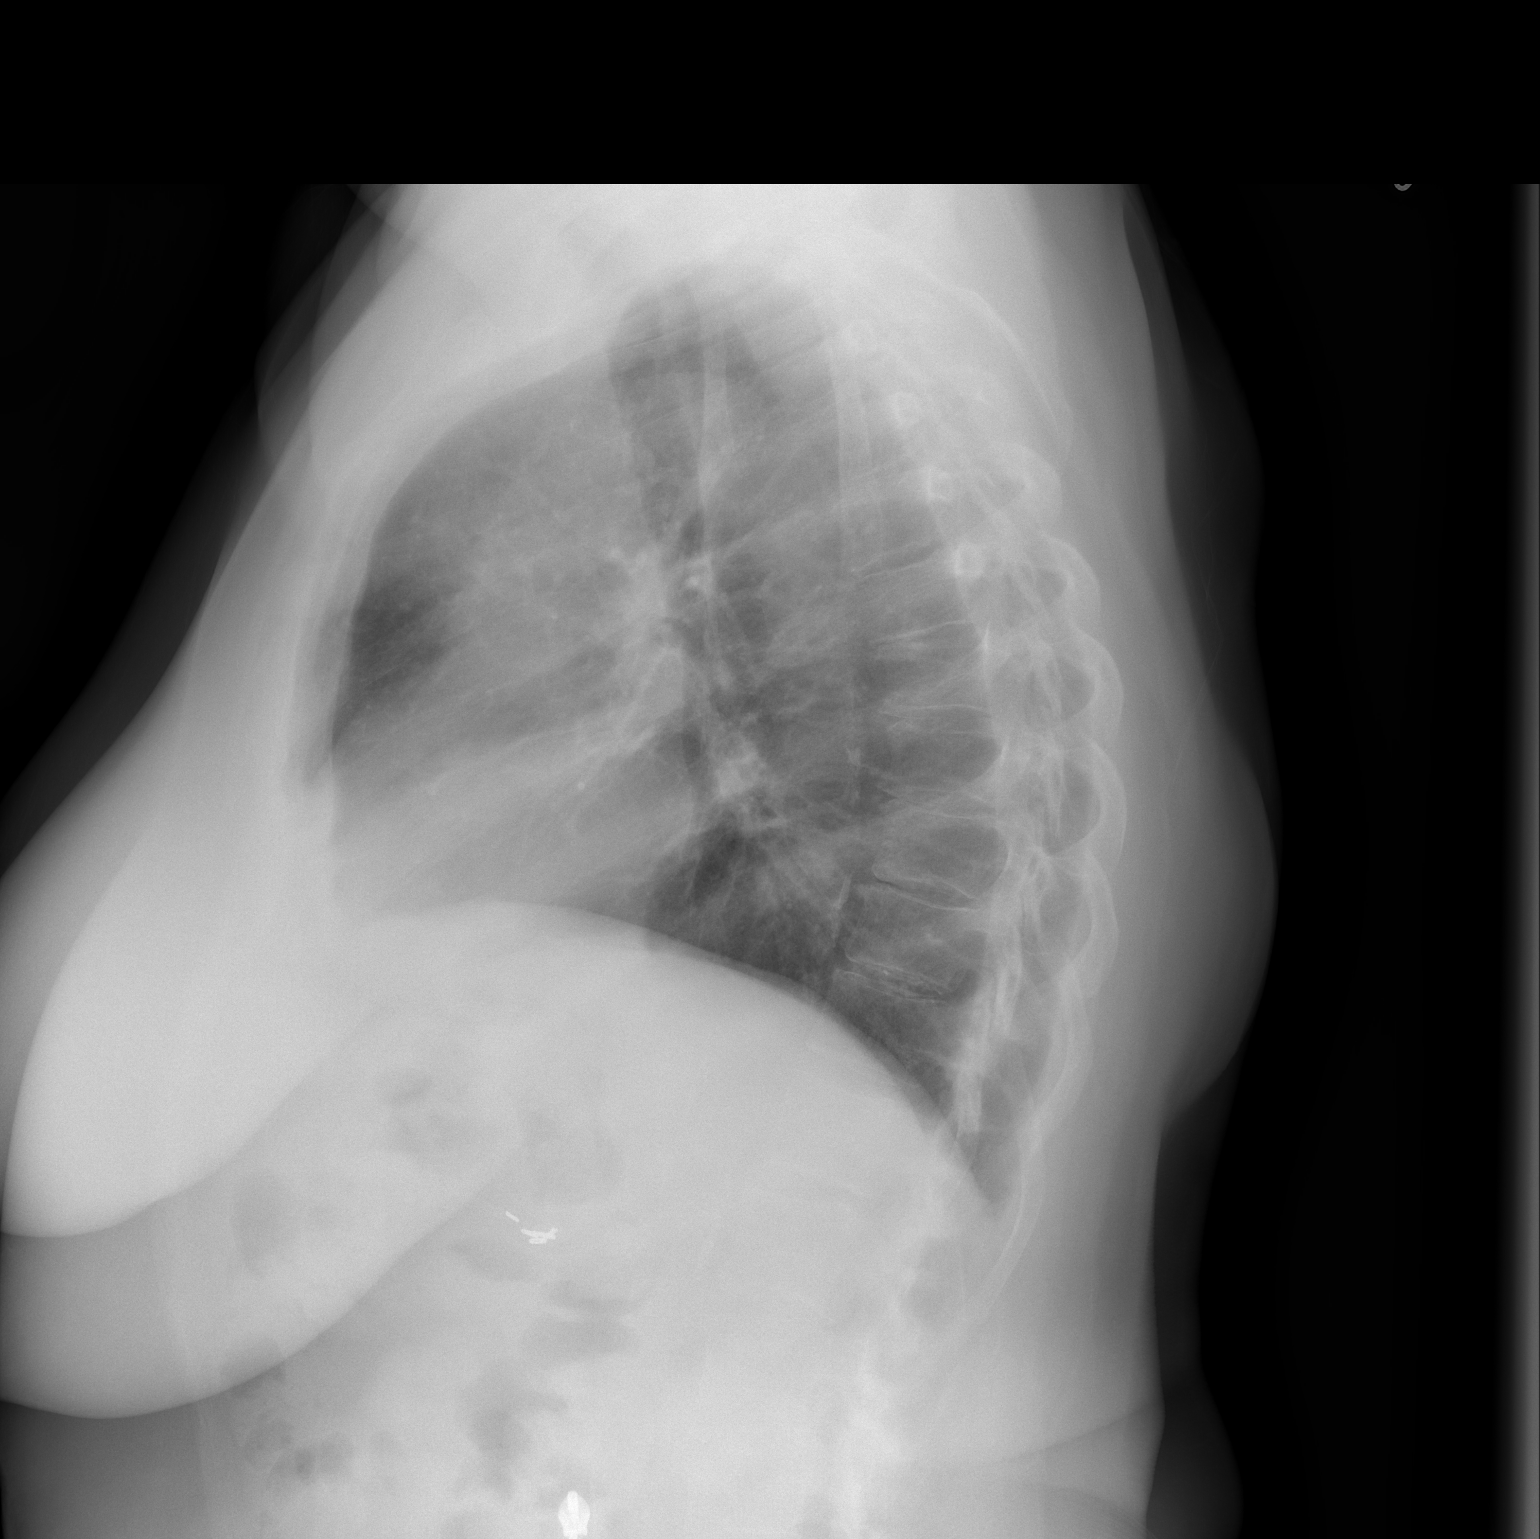

[2 of 2 positions shown; findings below may reference images not displayed]

FINDINGS: The thoracic aorta is mildly tortuous. The cardiac silhouette is
upper limits of normal in size. The lungs are slightly hypoinflated.
There is no evidence of airspace consolidation, edema, pleural
effusion, or pneumothorax. There is mild S-shaped thoracolumbar
scoliosis. Right upper quadrant surgical clips are noted.
IMPRESSION: No evidence of active cardiopulmonary disease.

## 2014-12-14 ENCOUNTER — Other Ambulatory Visit: Payer: Self-pay

## 2014-12-14 MED ORDER — FUROSEMIDE 40 MG PO TABS: 40.0000 mg | ORAL_TABLET | Freq: Every morning | ORAL | Status: DC

## 2015-01-24 ENCOUNTER — Encounter: Payer: Self-pay | Admitting: Internal Medicine

## 2015-01-24 ENCOUNTER — Ambulatory Visit (INDEPENDENT_AMBULATORY_CARE_PROVIDER_SITE_OTHER): Payer: Medicare Other | Admitting: Internal Medicine

## 2015-01-24 ENCOUNTER — Other Ambulatory Visit (INDEPENDENT_AMBULATORY_CARE_PROVIDER_SITE_OTHER): Payer: Medicare Other

## 2015-01-24 VITALS — BP 100/74 | HR 101 | Temp 97.8°F | Ht 60.0 in | Wt 189.2 lb

## 2015-01-24 VITALS — BP 132/80 | HR 56 | Ht 60.0 in | Wt 191.2 lb

## 2015-01-24 DIAGNOSIS — Z87898 Personal history of other specified conditions: Secondary | ICD-10-CM

## 2015-01-24 DIAGNOSIS — Z9889 Other specified postprocedural states: Secondary | ICD-10-CM

## 2015-01-24 DIAGNOSIS — R1314 Dysphagia, pharyngoesophageal phase: Secondary | ICD-10-CM

## 2015-01-24 DIAGNOSIS — Z87448 Personal history of other diseases of urinary system: Secondary | ICD-10-CM

## 2015-01-24 DIAGNOSIS — R300 Dysuria: Secondary | ICD-10-CM

## 2015-01-24 LAB — URINALYSIS, ROUTINE W REFLEX MICROSCOPIC
Bilirubin Urine: NEGATIVE
Ketones, ur: NEGATIVE
NITRITE: POSITIVE — AB
SPECIFIC GRAVITY, URINE: 1.01 (ref 1.000–1.030)
Total Protein, Urine: NEGATIVE
URINE GLUCOSE: NEGATIVE
UROBILINOGEN UA: 0.2 (ref 0.0–1.0)
pH: 7 (ref 5.0–8.0)

## 2015-01-24 NOTE — Progress Notes (Signed)
   Subjective:    Patient ID: Kylie Patrick, female    DOB: [birth year], 79 y.o.   MRN: [id number]  HPI She describes intermittent lower abdominal/bladder symptoms for the last 3 weeks. Mainly this is intermittent pressure in the pubic area. Lying down with feet elevated does help. She also has some urgency; this is mainly in the morning after she takes her diuretic. She will also have a cup of coffee in the morning.  Occasionally, averaging every other day she has minimal dysuria. She also has nocturia once nightly.  She's taken no antibiotics in last 30 days. She has no history of recurrent urinary tract infections; renal calculi; or genitourinary anomaly. She's had no GU procedures.  She did have a total abdominal hysterectomy and bilateral salpingo-oophorectomy for endometriosis remotely. She's had no abnormal Pap smears.  Her last A1c was 6.3% on 10/12/14.   Review of Systems She specifically denies fever, chills, sweats, hematuria, pyuria, or flank pain.      Objective:   Physical Exam Pertinent or positive findings include: Repeat pulse was 68.  She has marked accentuation of the upper thoracic curvature.  Fusiform changes of the knees are present.  She has incredibly good range of motion of the lower extremities with negative straight leg raising to 90.  She has slight tenting of the skin.   General appearance :adequately nourished; in no distress. Eyes: No conjunctival inflammation or scleral icterus is present. Oral exam:  Lips and gums are healthy appearing.There is no oropharyngeal erythema or exudate noted. Dental hygiene is good. Heart:  Normal rate and regular rhythm. S1 and S2 normal without gallop, murmur, click, rub or other extra sounds   Lungs:Chest clear to auscultation; no wheezes, rhonchi,rales ,or rubs present.No increased work of breathing.  Abdomen: bowel sounds normal, soft and non-tender without masses, organomegaly or hernias noted.  No guarding or  rebound. No flank tenderness to percussion. Vascular : all pulses equal ; no bruits present. Skin:Warm & dry.  Intact without suspicious lesions or rashes ; no  jaundice  Lymphatic: No lymphadenopathy is noted about the head, neck, axilla Neuro: Strength, tone & DTRs normal.        Assessment & Plan:  #1 intermittent urgency and dysuria  See orders and after visit summary

## 2015-01-24 NOTE — Progress Notes (Signed)
Pre visit review using our clinic review tool, if applicable. No additional management support is needed unless otherwise documented below in the visit note. 

## 2015-01-24 NOTE — Patient Instructions (Signed)
Drink as much nondairy fluids as possible. Avoid spicy foods or alcohol as  these may aggravate the bladder. Do not take decongestants. Avoid narcotics if possible. 

## 2015-01-24 NOTE — Progress Notes (Signed)
   Subjective:    Patient ID: Kylie Patrick, female    DOB: 1935-12-11, 79 y.o.   MRN: [id number] Chief complaint: Choking, swallowing difficulty HPI Patient is an elderly white woman that had laparoscopic fundoplication procedure performed by Dr. Excell Seltzer in 2014. She had a sliding hiatal hernia and dysphagia problems and reflux. Preoperative esophageal manometry was normal. She initially had a good response though she said the surgery was hard to recover from and she would not do it again. Sometime over the last 8 months she has developed intermittent dysphagia, bread, hamburger meat, similar consistency foods will give her trouble so she has modified her diet. The food will go down and feels like it doesn't pass and she'll regurgitate liquid and then the food. It is intermittent somewhat random and is improved with diet modification but it is a problem. She is able to belch.  Was unable to tolerate stopping her omeprazole after the I'll hernia repair. She said she still had reflux symptoms though she stayed on it. Wt Readings from Last 3 Encounters:  01/24/15 191 lb 4 oz (86.75 kg)  01/24/15 189 lb 4 oz (85.843 kg)  10/04/14 188 lb 4 oz (85.39 kg)    Medications, allergies, past medical history, past surgical history, family history and social history are reviewed and updated in the EMR.  Review of Systems As per history of present illness.    Objective:   Physical Exam BP 132/80 mmHg  Pulse 56  Ht 5' (1.524 m)  Wt 191 lb 4 oz (86.75 kg)  BMI 37.35 kg/m2 General:  NAD Lungs:  clear Heart:  S1S2 no rubs, murmurs or gallops Abdomen:  soft and nontender, BS+ Ext:   no edema    Data Reviewed:   As above. I have reviewed surgery follow-up notes operative notes previous studies for GI.       Assessment & Plan:  Dysphagia, pharyngoesophageal phase  S/P laparoscopic fundoplication  It's possible her fundoplication is slipped. The possibility of some stenosis at the GE  junction exist. Motility disturbance is possible as well. She had normal motility of the esophagus on esophageal manometry and she also normal gastric emptying study prior to her surgery.  We've decided to pursue an upper GI endoscopy. Possible esophageal dilation. She may need just to go with diet modification. Further plans pending these results. EGD  The risks and benefits as well as alternatives of endoscopic procedure(s) have been discussed and reviewed. All questions answered. The patient agrees to proceed.   I appreciate the opportunity to care for this patient. I will send a copy to [facility] M.D.

## 2015-01-24 NOTE — Patient Instructions (Signed)
  You have been scheduled for an endoscopy. Please follow written instructions given to you at your visit today. If you use inhalers (even only as needed), please bring them with you on the day of your procedure.   I appreciate the opportunity to care for you. Carl Gessner, MD, FACG 

## 2015-01-26 ENCOUNTER — Other Ambulatory Visit: Payer: Self-pay | Admitting: Internal Medicine

## 2015-01-27 ENCOUNTER — Ambulatory Visit (AMBULATORY_SURGERY_CENTER): Payer: Medicare Other | Admitting: Internal Medicine

## 2015-01-27 ENCOUNTER — Encounter: Payer: Self-pay | Admitting: Internal Medicine

## 2015-01-27 ENCOUNTER — Other Ambulatory Visit: Payer: Self-pay | Admitting: Internal Medicine

## 2015-01-27 VITALS — BP 108/75 | HR 66 | Temp 97.6°F | Resp 28 | Ht 60.0 in | Wt 191.0 lb

## 2015-01-27 DIAGNOSIS — K449 Diaphragmatic hernia without obstruction or gangrene: Secondary | ICD-10-CM

## 2015-01-27 DIAGNOSIS — R1314 Dysphagia, pharyngoesophageal phase: Secondary | ICD-10-CM

## 2015-01-27 DIAGNOSIS — N390 Urinary tract infection, site not specified: Secondary | ICD-10-CM

## 2015-01-27 DIAGNOSIS — R1319 Other dysphagia: Secondary | ICD-10-CM

## 2015-01-27 DIAGNOSIS — R1310 Dysphagia, unspecified: Secondary | ICD-10-CM

## 2015-01-27 LAB — CULTURE, URINE COMPREHENSIVE: Colony Count: >=100000

## 2015-01-27 MED ORDER — SODIUM CHLORIDE 0.9 % IV SOLN: 500.0000 mL | INTRAVENOUS | Status: DC

## 2015-01-27 MED ORDER — SULFAMETHOXAZOLE-TRIMETHOPRIM 800-160 MG PO TABS: 1.0000 | ORAL_TABLET | Freq: Two times a day (BID) | ORAL | Status: DC

## 2015-01-27 NOTE — Telephone Encounter (Signed)
Refill x 1 year 

## 2015-01-27 NOTE — Progress Notes (Signed)
A/ox3, pleased with MAC, report to RN 

## 2015-01-27 NOTE — Telephone Encounter (Signed)
Seen recently Sir, want her to continue?

## 2015-01-27 NOTE — Patient Instructions (Addendum)
There is a hiatal hernia seen today - the stomach may have slipped back up some again or it could be the way you are after surgery. Nothing needed to be stretched.  I think it makes sense to modify your diet (will give you a handout) - and see if that does not fix your problems. You have already done some of this.  Please come back to see me if the diet modification is not he answer.  I appreciate the opportunity to care for you. Gatha Mayer, MD, [facility]   Discharge instructions given. Handouts on hiatal hernia and gastritis. Resume previous medications. YOU HAD AN ENDOSCOPIC PROCEDURE TODAY AT [facility]:   Refer to the procedure report that was given to you for any specific questions about what was found during the examination.  If the procedure report does not answer your questions, please call your gastroenterologist to clarify.  If you requested that your care partner not be given the details of your procedure findings, then the procedure report has been included in a sealed envelope for you to review at your convenience later.  YOU SHOULD EXPECT: Some feelings of bloating in the abdomen. Passage of more gas than usual.  Walking can help get rid of the air that was put into your GI tract during the procedure and reduce the bloating. If you had a lower endoscopy (such as a colonoscopy or flexible sigmoidoscopy) you may notice spotting of blood in your stool or on the toilet paper. If you underwent a bowel prep for your procedure, you may not have a normal bowel movement for a few days.  Please Note:  You might notice some irritation and congestion in your nose or some drainage.  This is from the oxygen used during your procedure.  There is no need for concern and it should clear up in a day or so.  SYMPTOMS TO REPORT IMMEDIATELY:   Following lower endoscopy (colonoscopy or flexible sigmoidoscopy):  Excessive amounts of blood in the stool  Significant  tenderness or worsening of abdominal pains  Swelling of the abdomen that is new, acute  Fever of 100F or higher   For urgent or emergent issues, a gastroenterologist can be reached at any hour by calling [phone].   DIET: Your first meal following the procedure should be a small meal and then it is ok to progress to your normal diet. Heavy or fried foods are harder to digest and may make you feel nauseous or bloated.  Likewise, meals heavy in dairy and vegetables can increase bloating.  Drink plenty of fluids but you should avoid alcoholic beverages for 24 hours.  ACTIVITY:  You should plan to take it easy for the rest of today and you should NOT DRIVE or use heavy machinery until tomorrow (because of the sedation medicines used during the test).    FOLLOW UP: Our staff will call the number listed on your records the next business day following your procedure to check on you and address any questions or concerns that you may have regarding the information given to you following your procedure. If we do not reach you, we will leave a message.  However, if you are feeling well and you are not experiencing any problems, there is no need to return our call.  We will assume that you have returned to your regular daily activities without incident.  If any biopsies were taken you will be contacted by phone or by letter  within the next 1-3 weeks.  Please call us at [phone] if you have not heard about the biopsies in 3 weeks.    SIGNATURES/CONFIDENTIALITY: You and/or your care partner have signed paperwork which will be entered into your electronic medical record.  These signatures attest to the fact that that the information above on your After Visit Summary has been reviewed and is understood.  Full responsibility of the confidentiality of this discharge information lies with you and/or your care-partner.

## 2015-01-27 NOTE — Op Note (Signed)
[place]  [address] [place], [address]   ENDOSCOPY PROCEDURE REPORT  PATIENT: Kylie Patrick, Kylie Patrick  MR#: [id number] BIRTHDATE: [birth year] , 78  yrs. old GENDER: female ENDOSCOPIST: Gatha Mayer, MD, [facility] PROCEDURE DATE:  01/27/2015 PROCEDURE:  EGD, diagnostic ASA CLASS:     Class II INDICATIONS:  dysphagia. MEDICATIONS: Propofol 100 mg IV and Monitored anesthesia care TOPICAL ANESTHETIC: none  DESCRIPTION OF PROCEDURE: After the risks benefits and alternatives of the procedure were thoroughly explained, informed consent was obtained.  The LB [id number] endoscope was introduced through the mouth and advanced to the second portion of the duodenum , Without limitations.  The instrument was slowly withdrawn as the mucosa was fully examined.    1) 5 cm hiatal hernia - 35-30 cm. 2) Intact fundoplication 3) Non-erosive gastritis 4) Otherwise normal EGD.  Retroflexed views revealed as previously described.     The scope was then withdrawn from the patient and the procedure completed.  COMPLICATIONS: There were no immediate complications.  ENDOSCOPIC IMPRESSION: 1) 5 cm hiatal hernia - 35-30 cm. either recurrent or can be normal after repair of a large hiatal hernia like she had. 2) Intact fundoplication at 35 cm 3) Non-erosive gastritis 4) Otherwise normal EGD  RECOMMENDATIONS: Try diet modification w/ Dysphagia 4 or 3 diet If that does not work return to see me.   eSigned:  Gatha Mayer, MD, [facility] 01/27/2015 12:05 PM    CC:Ben Hoxworth, MD and The Patient

## 2015-01-28 ENCOUNTER — Telehealth: Payer: Self-pay | Admitting: *Deleted

## 2015-01-28 NOTE — Telephone Encounter (Signed)
  Follow up Call-  Call back number 01/27/2015 03/20/2013 02/05/2013  Post procedure Call Back phone  # [phone]  Permission to leave phone message Yes Yes Yes     Patient questions:  Do you have a fever, pain , or abdominal swelling? No. Pain Score  0 *  Have you tolerated food without any problems? Yes.    Have you been able to return to your normal activities? Yes.    Do you have any questions about your discharge instructions: Diet   No. Medications  No. Follow up visit  No.  Do you have questions or concerns about your Care? No.  Actions: * If pain score is 4 or above: No action needed, pain <4.

## 2015-03-14 ENCOUNTER — Other Ambulatory Visit: Payer: Self-pay | Admitting: Internal Medicine

## 2015-07-21 ENCOUNTER — Encounter: Payer: Self-pay | Admitting: Internal Medicine

## 2015-08-15 ENCOUNTER — Other Ambulatory Visit: Payer: Self-pay | Admitting: Internal Medicine

## 2015-09-07 ENCOUNTER — Other Ambulatory Visit: Payer: Self-pay | Admitting: Internal Medicine

## 2015-10-05 ENCOUNTER — Other Ambulatory Visit: Payer: Self-pay | Admitting: Internal Medicine

## 2015-10-10 ENCOUNTER — Encounter: Payer: Medicare Other | Admitting: Internal Medicine

## 2015-10-10 ENCOUNTER — Encounter: Payer: Self-pay | Admitting: Internal Medicine

## 2015-10-10 ENCOUNTER — Ambulatory Visit (INDEPENDENT_AMBULATORY_CARE_PROVIDER_SITE_OTHER): Payer: Medicare Other | Admitting: Internal Medicine

## 2015-10-10 ENCOUNTER — Ambulatory Visit (INDEPENDENT_AMBULATORY_CARE_PROVIDER_SITE_OTHER)
Admission: RE | Admit: 2015-10-10 | Discharge: 2015-10-10 | Disposition: A | Discharge status: Home / Self Care | Payer: Medicare Other | Source: Ambulatory Visit | Attending: Internal Medicine | Admitting: Internal Medicine

## 2015-10-10 VITALS — BP 112/80 | HR 66 | Temp 98.7°F | Resp 20 | Ht 60.0 in | Wt 190.2 lb

## 2015-10-10 DIAGNOSIS — Z0000 Encounter for general adult medical examination without abnormal findings: Secondary | ICD-10-CM

## 2015-10-10 DIAGNOSIS — Z23 Encounter for immunization: Secondary | ICD-10-CM | POA: Diagnosis not present

## 2015-10-10 DIAGNOSIS — R0602 Shortness of breath: Secondary | ICD-10-CM

## 2015-10-10 DIAGNOSIS — R202 Paresthesia of skin: Secondary | ICD-10-CM | POA: Diagnosis not present

## 2015-10-10 DIAGNOSIS — R200 Anesthesia of skin: Secondary | ICD-10-CM

## 2015-10-10 NOTE — Patient Instructions (Addendum)
We have reviewed your prior records including labs and tests today.  Test(s) ordered today. Your results will be released to [facility] (or called to you) after review, usually within 72hours after test completion. If any changes need to be made, you will be notified at that same time.  All other Health Maintenance issues reviewed.   All recommended immunizations and age-appropriate screenings are up-to-date.  Flu vaccine administered today.   Medications reviewed and updated.   No changes recommended at this time.   Please followup annually   Health Maintenance, Female Adopting a healthy lifestyle and getting preventive care can go a long way to promote health and wellness. Talk with your health care provider about what schedule of regular examinations is right for you. This is a good chance for you to check in with your provider about disease prevention and staying healthy. In between checkups, there are plenty of things you can do on your own. Experts have done a lot of research about which lifestyle changes and preventive measures are most likely to keep you healthy. Ask your health care provider for more information. WEIGHT AND DIET  Eat a healthy diet  Be sure to include plenty of vegetables, fruits, low-fat dairy products, and lean protein.  Do not eat a lot of foods high in solid fats, added sugars, or salt.  Get regular exercise. This is one of the most important things you can do for your health.  Most adults should exercise for at least 150 minutes each week. The exercise should increase your heart rate and make you sweat (moderate-intensity exercise).  Most adults should also do strengthening exercises at least twice a week. This is in addition to the moderate-intensity exercise.  Maintain a healthy weight  Body mass index (BMI) is a measurement that can be used to identify possible weight problems. It estimates body fat based on height and weight. Your health care provider  can help determine your BMI and help you achieve or maintain a healthy weight.  For females 53 years of age and older:   A BMI below 18.5 is considered underweight.  A BMI of 18.5 to 24.9 is normal.  A BMI of 25 to 29.9 is considered overweight.  A BMI of 30 and above is considered obese.  Watch levels of cholesterol and blood lipids  You should start having your blood tested for lipids and cholesterol at 77 years of age, then have this test every 5 years.  You may need to have your cholesterol levels checked more often if:  Your lipid or cholesterol levels are high.  You are older than 77 years of age.  You are at high risk for heart disease.  CANCER SCREENING   Lung Cancer  Lung cancer screening is recommended for adults 7-74 years old who are at high risk for lung cancer because of a history of smoking.  A yearly low-dose CT scan of the lungs is recommended for people who:  Currently smoke.  Have quit within the past 15 years.  Have at least a 30-pack-year history of smoking. A pack year is smoking an average of one pack of cigarettes a day for 1 year.  Yearly screening should continue until it has been 15 years since you quit.  Yearly screening should stop if you develop a health problem that would prevent you from having lung cancer treatment.  Breast Cancer  Practice breast self-awareness. This means understanding how your breasts normally appear and feel.  It also means  doing regular breast self-exams. Let your health care provider know about any changes, no matter how small.  If you are in your 20s or 30s, you should have a clinical breast exam (CBE) by a health care provider every 1-3 years as part of a regular health exam.  If you are 95 or older, have a CBE every year. Also consider having a breast X-ray (mammogram) every year.  If you have a family history of breast cancer, talk to your health care provider about genetic screening.  If you are at  high risk for breast cancer, talk to your health care provider about having an MRI and a mammogram every year.  Breast cancer gene (BRCA) assessment is recommended for women who have family members with BRCA-related cancers. BRCA-related cancers include:  Breast.  Ovarian.  Tubal.  Peritoneal cancers.  Results of the assessment will determine the need for genetic counseling and BRCA1 and BRCA2 testing. Cervical Cancer Your health care provider may recommend that you be screened regularly for cancer of the pelvic organs (ovaries, uterus, and vagina). This screening involves a pelvic examination, including checking for microscopic changes to the surface of your cervix (Pap test). You may be encouraged to have this screening done every 3 years, beginning at age 25.  For women ages 39-65, health care providers may recommend pelvic exams and Pap testing every 3 years, or they may recommend the Pap and pelvic exam, combined with testing for human papilloma virus (HPV), every 5 years. Some types of HPV increase your risk of cervical cancer. Testing for HPV may also be done on women of any age with unclear Pap test results.  Other health care providers may not recommend any screening for nonpregnant women who are considered low risk for pelvic cancer and who do not have symptoms. Ask your health care provider if a screening pelvic exam is right for you.  If you have had past treatment for cervical cancer or a condition that could lead to cancer, you need Pap tests and screening for cancer for at least 20 years after your treatment. If Pap tests have been discontinued, your risk factors (such as having a new sexual partner) need to be reassessed to determine if screening should resume. Some women have medical problems that increase the chance of getting cervical cancer. In these cases, your health care provider may recommend more frequent screening and Pap tests. Colorectal Cancer  This type of cancer  can be detected and often prevented.  Routine colorectal cancer screening usually begins at 77 years of age and continues through 77 years of age.  Your health care provider may recommend screening at an earlier age if you have risk factors for colon cancer.  Your health care provider may also recommend using home test kits to check for hidden blood in the stool.  A small camera at the end of a tube can be used to examine your colon directly (sigmoidoscopy or colonoscopy). This is done to check for the earliest forms of colorectal cancer.  Routine screening usually begins at age 59.  Direct examination of the colon should be repeated every 5-10 years through 77 years of age. However, you may need to be screened more often if early forms of precancerous polyps or small growths are found. Skin Cancer  Check your skin from head to toe regularly.  Tell your health care provider about any new moles or changes in moles, especially if there is a change in a mole's shape or  color.  Also tell your health care provider if you have a mole that is larger than the size of a pencil eraser.  Always use sunscreen. Apply sunscreen liberally and repeatedly throughout the day.  Protect yourself by wearing long sleeves, pants, a wide-brimmed hat, and sunglasses whenever you are outside. HEART DISEASE, DIABETES, AND HIGH BLOOD PRESSURE   High blood pressure causes heart disease and increases the risk of stroke. High blood pressure is more likely to develop in:  People who have blood pressure in the high end of the normal range (130-139/85-89 mm Hg).  People who are overweight or obese.  People who are African American.  If you are 59-21 years of age, have your blood pressure checked every 3-5 years. If you are 109 years of age or older, have your blood pressure checked every year. You should have your blood pressure measured twice--once when you are at a hospital or clinic, and once when you are not at a  hospital or clinic. Record the average of the two measurements. To check your blood pressure when you are not at a hospital or clinic, you can use:  An automated blood pressure machine at a pharmacy.  A home blood pressure monitor.  If you are between 15 years and 70 years old, ask your health care provider if you should take aspirin to prevent strokes.  Have regular diabetes screenings. This involves taking a blood sample to check your fasting blood sugar level.  If you are at a normal weight and have a low risk for diabetes, have this test once every three years after 77 years of age.  If you are overweight and have a high risk for diabetes, consider being tested at a younger age or more often. PREVENTING INFECTION  Hepatitis B  If you have a higher risk for hepatitis B, you should be screened for this virus. You are considered at high risk for hepatitis B if:  You were born in a country where hepatitis B is common. Ask your health care provider which countries are considered high risk.  Your parents were born in a high-risk country, and you have not been immunized against hepatitis B (hepatitis B vaccine).  You have HIV or AIDS.  You use needles to inject street drugs.  You live with someone who has hepatitis B.  You have had sex with someone who has hepatitis B.  You get hemodialysis treatment.  You take certain medicines for conditions, including cancer, organ transplantation, and autoimmune conditions. Hepatitis C  Blood testing is recommended for:  Everyone born from [birth year] through 1963.  Anyone with known risk factors for hepatitis C. Sexually transmitted infections (STIs)  You should be screened for sexually transmitted infections (STIs) including gonorrhea and chlamydia if:  You are sexually active and are younger than 77 years of age.  You are older than 77 years of age and your health care provider tells you that you are at risk for this type of  infection.  Your sexual activity has changed since you were last screened and you are at an increased risk for chlamydia or gonorrhea. Ask your health care provider if you are at risk.  If you do not have HIV, but are at risk, it may be recommended that you take a prescription medicine daily to prevent HIV infection. This is called pre-exposure prophylaxis (PrEP). You are considered at risk if:  You are sexually active and do not regularly use condoms or know the HIV status  of your partner(s).  You take drugs by injection.  You are sexually active with a partner who has HIV. Talk with your health care provider about whether you are at high risk of being infected with HIV. If you choose to begin PrEP, you should first be tested for HIV. You should then be tested every 3 months for as long as you are taking PrEP.  PREGNANCY   If you are premenopausal and you may become pregnant, ask your health care provider about preconception counseling.  If you may become pregnant, take 400 to 800 micrograms (mcg) of folic acid every day.  If you want to prevent pregnancy, talk to your health care provider about birth control (contraception). OSTEOPOROSIS AND MENOPAUSE   Osteoporosis is a disease in which the bones lose minerals and strength with aging. This can result in serious bone fractures. Your risk for osteoporosis can be identified using a bone density scan.  If you are 3 years of age or older, or if you are at risk for osteoporosis and fractures, ask your health care provider if you should be screened.  Ask your health care provider whether you should take a calcium or vitamin D supplement to lower your risk for osteoporosis.  Menopause may have certain physical symptoms and risks.  Hormone replacement therapy may reduce some of these symptoms and risks. Talk to your health care provider about whether hormone replacement therapy is right for you.  HOME CARE INSTRUCTIONS   Schedule regular  health, dental, and eye exams.  Stay current with your immunizations.   Do not use any tobacco products including cigarettes, chewing tobacco, or electronic cigarettes.  If you are pregnant, do not drink alcohol.  If you are breastfeeding, limit how much and how often you drink alcohol.  Limit alcohol intake to no more than 1 drink per day for nonpregnant women. One drink equals 12 ounces of beer, 5 ounces of wine, or 1 ounces of hard liquor.  Do not use street drugs.  Do not share needles.  Ask your health care provider for help if you need support or information about quitting drugs.  Tell your health care provider if you often feel depressed.  Tell your health care provider if you have ever been abused or do not feel safe at home.   This information is not intended to replace advice given to you by your health care provider. Make sure you discuss any questions you have with your health care provider.   Document Released: 05/14/2011 Document Revised: 11/19/2014 Document Reviewed: 09/30/2013 Elsevier Interactive Patient Education [employer].

## 2015-10-10 NOTE — Progress Notes (Signed)
Subjective:    Patient ID: Kylie Patrick, female    DOB: [birth year], 79 y.o.   MRN: [id number]  HPI She is here to establish with a new pcp.  She is here for a physical exam.  For the past several days she has been very sob.  She walked in from the car and was short of breath.  She does have a hiatal hernia.  She had surgery, but has developed another 5 cm hernia.   She denies cold symptoms.  She denies a history of asthma or other lung problems.  Right hand:  Last year Dr Linna Darner thought she had a pinched nerve.  She bought a hand brace and it helped.  If she lays on her right side for 15 min she gets tingling in her right hand.  She can only sleep on her back and has to leave her arm straight.  It only bothers her at night - not during the day.  .  She thinks she may have some weakness in her right hand.  She has intermittent tingling from her mid hand to her fingers, and involves her thumb.  She denies neck pain.  She denies shoulder or elbow pain.    Medications and allergies reviewed with patient and updated if appropriate.  Patient Active Problem List   Diagnosis Date Noted  . Hyperglycemia 10/09/2014  . Syncope 06/14/2014  . GERD (gastroesophageal reflux disease) 10/22/2013  . Hiatal hernia 08/03/2013  . Nonspecific abnormal electrocardiogram (ECG) (EKG) 01/09/2013  . Personal history of colonic polyps 01/09/2013  . RENAL DISEASE, CHRONIC, MILD 05/05/2008  . ANXIETY STATE, UNSPECIFIED 04/20/2008  . NIGHT SWEATS 02/19/2008  . HYPERLIPIDEMIA 08/12/2007  . DISORDER, DYSMETABOLIC SYNDROME X 123XX123  . Essential hypertension 08/12/2007  . ACID REFLUX DISEASE 08/12/2007    Current Outpatient Prescriptions on File Prior to Visit  Medication Sig Dispense Refill  . aspirin 81 MG tablet Take 81 mg by mouth daily.      . Calcium in Bone Mineral Cmplx 350 MG MISC Take 1-2 each by mouth 2 (two) times daily. Takes 1 in the morning and 2 at night    . carvedilol (COREG) 25 MG  tablet TAKE 1/2 TABLET BY MOUTH TWICE A DAY 180 tablet 1  . Cholecalciferol (VITAMIN D3) 2000 UNITS TABS Take 1 tablet by mouth daily.     Marland Kitchen ezetimibe-simvastatin (VYTORIN) 10-20 MG tablet Take 1 tablet by mouth at bedtime. --- Please establish with new PCP for further refills. 90 tablet 0  . folic acid (FOLVITE) A999333 MCG tablet Take 400 mcg by mouth every morning.    . furosemide (LASIX) 40 MG tablet TAKE 1 TABLET (40 MG TOTAL) BY MOUTH EVERY MORNING. 90 tablet 1  . gabapentin (NEURONTIN) 100 MG capsule Take 1 capsule (100 mg total) by mouth every 8 (eight) hours as needed (pain). 30 capsule 2  . Magnesium 400 MG CAPS Take 400 mg by mouth daily.     . Omega-3 Fatty Acids (FISH OIL) 500 MG CAPS Take 500 mg by mouth daily.     Marland Kitchen omeprazole-sodium bicarbonate (ZEGERID) 40-1100 MG per capsule TAKE ONE CAPSULE BY MOUTH TWICE A DAY 180 capsule 3  . valsartan (DIOVAN) 320 MG tablet Take 1 tablet (320 mg total) by mouth daily. --- Pt needs Wellness Exam for further refills. 90 tablet 0   No current facility-administered medications on file prior to visit.    Past Medical History  Diagnosis Date  . Hyperlipidemia   .  Hypertension     w/ LVH on ECHO  . Gastroenteritis     w/ renal insufficiency in context of protracted nausea & vomitting 2006  . Tonsillitis 2006  . Colon polyp 2003  . GERD (gastroesophageal reflux disease)   . H/O hiatal hernia   . Anxiety   . Headache(784.0)     occasional  . Complication of anesthesia     slow to wake up one time    Past Surgical History  Procedure Laterality Date  . Cholecystectomy  1970  . Colonoscopy w/ polypectomy  2003    Dr [name]  . Esophagogastroduodenoscopy  02/05/13    Large Hiatal Hernia  . Breast lumpectomy Left 70's    X 2  . Cataract extraction w/phaco Left 05/14/2013    Procedure: CATARACT EXTRACTION PHACO AND INTRAOCULAR LENS PLACEMENT (IOC);  Surgeon: Tonny Branch, MD;  Location: [facility];  Service: Ophthalmology;  Laterality: Left;   CDE:9.76  . Cataract extraction w/phaco Right 06/08/2013    Procedure: CATARACT EXTRACTION PHACO AND INTRAOCULAR LENS PLACEMENT (IOC);  Surgeon: Tonny Branch, MD;  Location: [facility];  Service: Ophthalmology;  Laterality: Right;  CDE 13.65  . Esophageal manometry N/A 10/05/2013    Procedure: ESOPHAGEAL MANOMETRY (EM);  Surgeon: Gatha Mayer, MD;  Location: [facility];  Service: Endoscopy;  Laterality: N/A;  . Appendectomy  1970  . Abdominal hysterectomy  1976    BSO for endometriosis and bengin tumor  . Bunionectomy Bilateral   . Laparoscopic nissen fundoplication Bilateral 99991111    Procedure: LAPAROSCOPIC NISSEN FUNDOPLICATION with hiatal hernia repair ;  Surgeon: Edward Jolly, MD;  Location: [facility];  Service: General;  Laterality: Bilateral;    Social History   Social History  . Marital Status: Married    Spouse Name: N/A  . Number of Children: 2  . Years of Education: N/A   Occupational History  . Retired    Social History Main Topics  . Smoking status: Never Smoker   . Smokeless tobacco: Never Used  . Alcohol Use: Yes     Comment: Wine-VERY RARELY  . Drug Use: No  . Sexual Activity: Not Currently   Other Topics Concern  . None   Social History Narrative    Review of Systems  Constitutional: Positive for fatigue (decreased energy for months). Negative for fever, chills, appetite change and unexpected weight change.  HENT: Positive for rhinorrhea and trouble swallowing (from hiatal hernia). Negative for congestion, ear pain, postnasal drip, sinus pressure, sneezing and sore throat.   Eyes: Negative for visual disturbance.  Respiratory: Positive for shortness of breath (with exertion) and wheezing (a couple of times). Negative for cough and chest tightness.   Cardiovascular: Positive for palpitations (ocassional flutter) and leg swelling (occasional left foot swelling). Negative for chest pain.  Gastrointestinal: Negative for nausea, abdominal pain, diarrhea,  constipation and blood in stool.       No GERD  Genitourinary: Negative for dysuria and hematuria.  Musculoskeletal: Negative for myalgias, back pain and arthralgias.  Neurological: Negative for dizziness, light-headedness and headaches.  Psychiatric/Behavioral: Negative for dysphoric mood. The patient is not nervous/anxious.        Objective:   Filed Vitals:   10/10/15 1328  BP: 112/80  Pulse: 66  Temp: 98.7 F (37.1 C)  Resp: 20   Filed Weights   10/10/15 1328  Weight: 190 lb 4 oz (86.297 kg)   Body mass index is 37.16 kg/(m^2).   Physical Exam   Constitutional: She appears  well-developed and well-nourished. No distress.  HENT:  Head: Normocephalic and atraumatic.  Right Ear: External ear normal.  Left Ear: External ear normal.  Mouth/Throat: Oropharynx is clear and moist.  Normal bilateral ear canals and tympanic membranes  Eyes: Conjunctivae and EOM are normal.  Neck: Neck supple. No tracheal deviation present. No thyromegaly present.  No carotid bruit  Cardiovascular: Normal rate, regular rhythm, 1/6 systolic murmur Pulmonary/Chest: Effort normal. No respiratory distress. She has no wheezes. She has bibasilar crackles Abdominal: Soft. She exhibits no distension. There is no tenderness.  Musculoskeletal: She exhibits no edema.  Lymphadenopathy:    She has no cervical adenopathy.  Skin: Skin is warm and dry. She is not diaphoretic.  Psychiatric: She has a normal mood and affect. Her behavior is normal.         Assessment & Plan:   Physical exam Screening blood work Flu vaccine today Will have prevnar in 2 weeks Mammo, colonoscopy up to date dexa up to date Stressed regular exercise and weight loss Eye exam up to date No skin concerns No substance abuse Weight loss recommended   Right hand tingling/pain and possible weakness Possible carpal tunnel Need to rule out cervical radiculopathy Recommended and EMG - she deferred today, but will call if  she changes her mind Continue wrist brace  Sob with exertion cxr Echo, bnp Check blood work No cold symptoms so I doubt this is related to PNA or a URI Will await above and determine further evaluation if neeed

## 2015-10-10 NOTE — Progress Notes (Signed)
Pre visit review using our clinic review tool, if applicable. No additional management support is needed unless otherwise documented below in the visit note. 

## 2015-10-18 ENCOUNTER — Other Ambulatory Visit: Payer: Self-pay

## 2015-10-18 ENCOUNTER — Ambulatory Visit (HOSPITAL_COMMUNITY): Payer: Medicare Other | Attending: Cardiology

## 2015-10-18 DIAGNOSIS — I340 Nonrheumatic mitral (valve) insufficiency: Secondary | ICD-10-CM | POA: Insufficient documentation

## 2015-10-18 DIAGNOSIS — R0602 Shortness of breath: Secondary | ICD-10-CM | POA: Diagnosis not present

## 2015-10-18 DIAGNOSIS — R0600 Dyspnea, unspecified: Secondary | ICD-10-CM | POA: Diagnosis present

## 2015-10-18 DIAGNOSIS — I358 Other nonrheumatic aortic valve disorders: Secondary | ICD-10-CM | POA: Insufficient documentation

## 2015-10-18 DIAGNOSIS — I129 Hypertensive chronic kidney disease with stage 1 through stage 4 chronic kidney disease, or unspecified chronic kidney disease: Secondary | ICD-10-CM | POA: Diagnosis not present

## 2015-10-18 DIAGNOSIS — N189 Chronic kidney disease, unspecified: Secondary | ICD-10-CM | POA: Insufficient documentation

## 2015-10-18 DIAGNOSIS — E785 Hyperlipidemia, unspecified: Secondary | ICD-10-CM | POA: Diagnosis not present

## 2015-10-25 ENCOUNTER — Other Ambulatory Visit (INDEPENDENT_AMBULATORY_CARE_PROVIDER_SITE_OTHER): Payer: Medicare Other

## 2015-10-25 ENCOUNTER — Ambulatory Visit (INDEPENDENT_AMBULATORY_CARE_PROVIDER_SITE_OTHER): Payer: Medicare Other

## 2015-10-25 DIAGNOSIS — R0602 Shortness of breath: Secondary | ICD-10-CM | POA: Diagnosis not present

## 2015-10-25 DIAGNOSIS — E785 Hyperlipidemia, unspecified: Secondary | ICD-10-CM

## 2015-10-25 DIAGNOSIS — Z0000 Encounter for general adult medical examination without abnormal findings: Secondary | ICD-10-CM | POA: Diagnosis not present

## 2015-10-25 DIAGNOSIS — Z23 Encounter for immunization: Secondary | ICD-10-CM | POA: Diagnosis not present

## 2015-10-25 DIAGNOSIS — I10 Essential (primary) hypertension: Secondary | ICD-10-CM

## 2015-10-25 LAB — COMPREHENSIVE METABOLIC PANEL
ALBUMIN: 4.1 g/dL (ref 3.5–5.2)
ALT: 17 U/L (ref 0–35)
AST: 23 U/L (ref 0–37)
Alkaline Phosphatase: 72 U/L (ref 39–117)
BUN: 19 mg/dL (ref 6–23)
CALCIUM: 9.6 mg/dL (ref 8.4–10.5)
CHLORIDE: 103 meq/L (ref 96–112)
CO2: 33 mEq/L — ABNORMAL HIGH (ref 19–32)
CREATININE: 1.55 mg/dL — AB (ref 0.40–1.20)
GFR: 34.42 mL/min — AB (ref >60.00)
Glucose, Bld: 105 mg/dL — ABNORMAL HIGH (ref 70–99)
POTASSIUM: 3.6 meq/L (ref 3.5–5.1)
Sodium: 144 mEq/L (ref 135–145)
Total Bilirubin: 0.6 mg/dL (ref 0.2–1.2)
Total Protein: 6.7 g/dL (ref 6.0–8.3)

## 2015-10-25 LAB — CBC WITH DIFFERENTIAL/PLATELET
BASOS PCT: 0.8 % (ref 0.0–3.0)
Basophils Absolute: 0 10*3/uL (ref 0.0–0.1)
EOS PCT: 5.4 % — AB (ref 0.0–5.0)
Eosinophils Absolute: 0.3 10*3/uL (ref 0.0–0.7)
HEMATOCRIT: 40.8 % (ref 36.0–46.0)
HEMOGLOBIN: 13.3 g/dL (ref 12.0–15.0)
LYMPHS PCT: 32.3 % (ref 12.0–46.0)
Lymphs Abs: 1.8 10*3/uL (ref 0.7–4.0)
MCHC: 32.6 g/dL (ref 30.0–36.0)
MCV: 84.8 fl (ref 78.0–100.0)
MONOS PCT: 11 % (ref 3.0–12.0)
Monocytes Absolute: 0.6 10*3/uL (ref 0.1–1.0)
NEUTROS ABS: 2.9 10*3/uL (ref 1.4–7.7)
Neutrophils Relative %: 50.5 % (ref 43.0–77.0)
PLATELETS: 211 10*3/uL (ref 150.0–400.0)
RBC: 4.81 Mil/uL (ref 3.87–5.11)
RDW: 14.9 % (ref 11.5–15.5)
WBC: 5.7 10*3/uL (ref 4.0–10.5)

## 2015-10-25 LAB — BRAIN NATRIURETIC PEPTIDE: Pro B Natriuretic peptide (BNP): 31 pg/mL (ref 0.0–100.0)

## 2015-10-25 LAB — HEMOGLOBIN A1C: HEMOGLOBIN A1C: 6 % (ref 4.6–6.5)

## 2015-10-25 LAB — LIPID PANEL
CHOLESTEROL: 146 mg/dL (ref 0–200)
HDL: 47.7 mg/dL (ref >39.00)
LDL Cholesterol: 76 mg/dL (ref 0–99)
NonHDL: 98.78
TRIGLYCERIDES: 114 mg/dL (ref 0.0–149.0)
Total CHOL/HDL Ratio: 3
VLDL: 22.8 mg/dL (ref 0.0–40.0)

## 2015-10-25 LAB — TSH: TSH: 1.7 u[IU]/mL (ref 0.35–4.50)

## 2015-10-25 LAB — VITAMIN B12: VITAMIN B 12: 226 pg/mL (ref 211–911)

## 2015-11-09 ENCOUNTER — Other Ambulatory Visit: Payer: Self-pay | Admitting: Internal Medicine

## 2015-12-03 ENCOUNTER — Other Ambulatory Visit: Payer: Self-pay | Admitting: Internal Medicine

## 2016-01-04 ENCOUNTER — Other Ambulatory Visit: Payer: Self-pay | Admitting: Internal Medicine

## 2016-01-18 ENCOUNTER — Other Ambulatory Visit: Payer: Self-pay | Admitting: Internal Medicine

## 2016-03-12 ENCOUNTER — Other Ambulatory Visit: Payer: Self-pay

## 2016-03-12 MED ORDER — FUROSEMIDE 40 MG PO TABS: ORAL_TABLET | ORAL | Status: DC

## 2016-07-06 ENCOUNTER — Telehealth: Payer: Self-pay

## 2016-07-06 NOTE — Telephone Encounter (Signed)
Patient is on the list for [employer] 2017 and may be a good candidate for an AWV in 2017. Please let me know if/when appt is scheduled.   

## 2016-07-08 ENCOUNTER — Encounter (HOSPITAL_COMMUNITY): Payer: Self-pay | Admitting: Emergency Medicine

## 2016-07-08 ENCOUNTER — Emergency Department (HOSPITAL_COMMUNITY)
Admission: EM | Admit: 2016-07-08 | Discharge: 2016-07-08 | Disposition: A | Discharge status: Home / Self Care | Payer: Medicare Other | Attending: Emergency Medicine | Admitting: Emergency Medicine

## 2016-07-08 DIAGNOSIS — I10 Essential (primary) hypertension: Secondary | ICD-10-CM | POA: Diagnosis not present

## 2016-07-08 DIAGNOSIS — Z7982 Long term (current) use of aspirin: Secondary | ICD-10-CM | POA: Insufficient documentation

## 2016-07-08 DIAGNOSIS — R002 Palpitations: Secondary | ICD-10-CM | POA: Diagnosis not present

## 2016-07-08 DIAGNOSIS — Z79899 Other long term (current) drug therapy: Secondary | ICD-10-CM | POA: Diagnosis not present

## 2016-07-08 DIAGNOSIS — R55 Syncope and collapse: Secondary | ICD-10-CM | POA: Diagnosis present

## 2016-07-08 LAB — URINALYSIS, ROUTINE W REFLEX MICROSCOPIC
Bilirubin Urine: NEGATIVE
Glucose, UA: NEGATIVE mg/dL
Hgb urine dipstick: NEGATIVE
Ketones, ur: NEGATIVE mg/dL
Leukocytes, UA: NEGATIVE
Nitrite: NEGATIVE
Protein, ur: NEGATIVE mg/dL
Specific Gravity, Urine: 1.009 (ref 1.005–1.030)
pH: 8 (ref 5.0–8.0)

## 2016-07-08 LAB — BASIC METABOLIC PANEL
Anion gap: 7 (ref 5–15)
BUN: 18 mg/dL (ref 6–20)
CO2: 26 mmol/L (ref 22–32)
Calcium: 9.4 mg/dL (ref 8.9–10.3)
Chloride: 107 mmol/L (ref 101–111)
Creatinine, Ser: 1.57 mg/dL — ABNORMAL HIGH (ref 0.44–1.00)
GFR calc Af Amer: 35 mL/min — ABNORMAL LOW (ref >60)
GFR calc non Af Amer: 30 mL/min — ABNORMAL LOW (ref >60)
Glucose, Bld: 112 mg/dL — ABNORMAL HIGH (ref 65–99)
Potassium: 3.7 mmol/L (ref 3.5–5.1)
Sodium: 140 mmol/L (ref 135–145)

## 2016-07-08 LAB — CBC
HCT: 39.6 % (ref 36.0–46.0)
Hemoglobin: 12.8 g/dL (ref 12.0–15.0)
MCH: 28.7 pg (ref 26.0–34.0)
MCHC: 32.3 g/dL (ref 30.0–36.0)
MCV: 88.8 fL (ref 78.0–100.0)
Platelets: 251 10*3/uL (ref 150–400)
RBC: 4.46 MIL/uL (ref 3.87–5.11)
RDW: 14.8 % (ref 11.5–15.5)
WBC: 6.7 10*3/uL (ref 4.0–10.5)

## 2016-07-08 LAB — CBG MONITORING, ED: Glucose-Capillary: 103 mg/dL — ABNORMAL HIGH (ref 65–99)

## 2016-07-08 NOTE — ED Triage Notes (Signed)
Pt states she was leaving church (sitting in her car) and she felt swimmy headed and experienced 3 episodes of LOC while she was in the car. Pt states this happened around 1300. Pt states this happened years ago, but was not evaluated for it. Pt states she was sitting all three times and incurred no incidents.  Pt denies recent illness. Pt states she ate a normal lunch. Pt states she felt a tingling on her face prior to the LOC. Pt has normal neuro exam.

## 2016-07-08 NOTE — ED Notes (Addendum)
Pt states she was on her way home from church after lunch when she blacked out and had an episode of where she didn't know what happened. This 2 same spell happened 2 additional times on the way home. Pt stated her pain felt strange and dizzy during these episodes. Pt denies chest pain but does state her heart was racing and her face had a tingling. Pt states currently she feels weak.

## 2016-07-08 NOTE — ED Provider Notes (Signed)
[facility] DEPT Provider Note   CSN: [id number] Arrival date & time: 07/08/16  1450  By signing my name below, I, Reola Mosher, attest that this documentation has been prepared under the direction and in the presence of Virgel Manifold, MD. Electronically Signed: Reola Mosher, ED Scribe. 07/08/16. 4:54 PM.  History   Chief Complaint Chief Complaint  Patient presents with  . Loss of Consciousness   The history is provided by the patient and a relative. No language interpreter was used.   HPI Comments: Kylie Patrick is a 80 y.o. female with a PMHx significant of anxiety, HLD, and HTN, who presents to the Emergency Department complaining of three sudden onset syncopal episodes that occurred ~3 hours PTA. Pt reports associated room-spinning dizziness and left-side facial tingling prior to each of her episodes. She notes that each episode only lasted a few minutes, and happened in succession during a ~30 minute period. Pt reports that her episodes occurred while she was riding in a car. Upon arriving at her house she laid down and began to feel a sensation of palpitations which have since resolved. She has had a similar episode of syncope "several years ago", but did not have a specific workup performed at that time. No hx of cardiac issues, but notes that she does have a FHx of MI, CAD, and DM. Denies tinnitus, fever, chills, melena, bloody stools, or any other associated symptoms.   Past Medical History:  Diagnosis Date  . Anxiety   . Colon polyp 2003  . Complication of anesthesia    slow to wake up one time  . Gastroenteritis    w/ renal insufficiency in context of protracted nausea & vomitting 2006  . GERD (gastroesophageal reflux disease)   . H/O hiatal hernia   . Headache(784.0)    occasional  . Hyperlipidemia   . Hypertension    w/ LVH on ECHO  . Tonsillitis 2006   Patient Active Problem List   Diagnosis Date Noted  . Hyperglycemia 10/09/2014  . Syncope  06/14/2014  . GERD (gastroesophageal reflux disease) 10/22/2013  . Hiatal hernia 08/03/2013  . Nonspecific abnormal electrocardiogram (ECG) (EKG) 01/09/2013  . Personal history of colonic polyps 01/09/2013  . RENAL DISEASE, CHRONIC, MILD 05/05/2008  . ANXIETY STATE, UNSPECIFIED 04/20/2008  . NIGHT SWEATS 02/19/2008  . HYPERLIPIDEMIA 08/12/2007  . DISORDER, DYSMETABOLIC SYNDROME X 123XX123  . Essential hypertension 08/12/2007  . ACID REFLUX DISEASE 08/12/2007   Past Surgical History:  Procedure Laterality Date  . ABDOMINAL HYSTERECTOMY  1976   BSO for endometriosis and bengin tumor  . APPENDECTOMY  1970  . BREAST LUMPECTOMY Left 70's   X 2  . BUNIONECTOMY Bilateral   . CATARACT EXTRACTION W/PHACO Left 05/14/2013   Procedure: CATARACT EXTRACTION PHACO AND INTRAOCULAR LENS PLACEMENT (IOC);  Surgeon: Tonny Branch, MD;  Location: [facility];  Service: Ophthalmology;  Laterality: Left;  CDE:9.76  . CATARACT EXTRACTION W/PHACO Right 06/08/2013   Procedure: CATARACT EXTRACTION PHACO AND INTRAOCULAR LENS PLACEMENT (IOC);  Surgeon: Tonny Branch, MD;  Location: [facility];  Service: Ophthalmology;  Laterality: Right;  CDE 13.65  . CHOLECYSTECTOMY  1970  . COLONOSCOPY W/ POLYPECTOMY  2003   Dr [name]  . ESOPHAGEAL MANOMETRY N/A 10/05/2013   Procedure: ESOPHAGEAL MANOMETRY (EM);  Surgeon: Gatha Mayer, MD;  Location: [facility];  Service: Endoscopy;  Laterality: N/A;  . ESOPHAGOGASTRODUODENOSCOPY  02/05/13   Large Hiatal Hernia  . HERNIA REPAIR    . LAPAROSCOPIC NISSEN FUNDOPLICATION Bilateral 99991111  Procedure: LAPAROSCOPIC NISSEN FUNDOPLICATION with hiatal hernia repair ;  Surgeon: Edward Jolly, MD;  Location: [facility];  Service: General;  Laterality: Bilateral;   OB History    No data available     Home Medications    Prior to Admission medications   Medication Sig Start Date End Date Taking? Authorizing Provider  aspirin 81 MG tablet Take 81 mg by mouth daily.      Historical  Provider, MD  Calcium in Bone Mineral Cmplx 350 MG MISC Take 1-2 each by mouth 2 (two) times daily. Takes 1 in the morning and 2 at night    Historical Provider, MD  carvedilol (COREG) 25 MG tablet TAKE 1/2 TABLET BY MOUTH TWICE A DAY 11/09/15   Binnie Rail, MD  Cholecalciferol (VITAMIN D3) 2000 UNITS TABS Take 1 tablet by mouth daily.     Historical Provider, MD  folic acid (FOLVITE) A999333 MCG tablet Take 400 mcg by mouth every morning.    Historical Provider, MD  furosemide (LASIX) 40 MG tablet TAKE 1 TABLET (40 MG TOTAL) BY MOUTH EVERY MORNING. 03/12/16   Binnie Rail, MD  gabapentin (NEURONTIN) 100 MG capsule Take 1 capsule (100 mg total) by mouth every 8 (eight) hours as needed (pain). 06/14/14   Hendricks Limes, MD  Magnesium 400 MG CAPS Take 400 mg by mouth daily.     Historical Provider, MD  Omega-3 Fatty Acids (FISH OIL) 500 MG CAPS Take 500 mg by mouth daily.     Historical Provider, MD  omeprazole-sodium bicarbonate (ZEGERID) 40-1100 MG capsule TAKE ONE CAPSULE BY MOUTH TWICE A DAY 01/18/16   Gatha Mayer, MD  valsartan (DIOVAN) 320 MG tablet TAKE 1 TABLET EVERY DAY 12/05/15   Binnie Rail, MD  VYTORIN 10-20 MG tablet TAKE 1 TABLET BY MOUTH AT BEDTIME. --- PLEASE ESTABLISH WITH NEW PCP FOR FURTHER REFILLS. 01/04/16   Binnie Rail, MD   Family History Family History  Problem Relation Age of Onset  . Heart attack Father 40    fatal MI @ 37  . Coronary artery disease Mother     CABG; MI @ 59  . Diabetes Mother   . Stroke Paternal Grandfather     > 17  . Stroke Maternal Grandmother     in 34s  . Aneurysm Paternal Grandmother     cns ; in 58s  . Diabetes Maternal Aunt   . Stomach cancer Maternal Aunt   . Breast cancer Maternal Aunt   . Heart disease Sister   . Coronary artery disease Maternal Grandfather    Social History Social History  Substance Use Topics  . Smoking status: Never Smoker  . Smokeless tobacco: Never Used  . Alcohol use Yes     Comment: Wine-VERY RARELY    Allergies   Chlorthalidone; Hydrochlorothiazide; Losartan potassium-hctz; Norvasc [amlodipine besylate]; Raloxifene; and Rofecoxib  Review of Systems Review of Systems  Constitutional: Negative for chills and fever.  HENT: Negative for tinnitus.   Cardiovascular: Positive for palpitations.  Gastrointestinal: Negative for blood in stool.  Neurological: Positive for dizziness and syncope.       Positive for tingling.   All other systems reviewed and are negative.  Physical Exam Updated Vital Signs BP 194/91 (BP Location: Right Arm)   Pulse 69   Temp 97.8 F (36.6 C) (Oral)   Resp 17   Ht 5' (1.524 m)   Wt 180 lb (81.6 kg)   SpO2 100%   BMI  35.15 kg/m   Physical Exam  Constitutional: She appears well-developed and well-nourished. No distress.  HENT:  Head: Normocephalic and atraumatic.  Mouth/Throat: Oropharynx is clear and moist. No oropharyngeal exudate.  Eyes: Conjunctivae and EOM are normal. Pupils are equal, round, and reactive to light. Right eye exhibits no discharge. Left eye exhibits no discharge. No scleral icterus.  Neck: Normal range of motion. Neck supple. No JVD present. No thyromegaly present.  Cardiovascular: Normal rate, regular rhythm, normal heart sounds and intact distal pulses.  Exam reveals no gallop and no friction rub.   No murmur heard. Pulmonary/Chest: Effort normal and breath sounds normal. No respiratory distress. She has no wheezes. She has no rales.  Abdominal: Soft. Bowel sounds are normal. She exhibits no distension and no mass. There is no tenderness.  Musculoskeletal: Normal range of motion. She exhibits no edema or tenderness.  Lymphadenopathy:    She has no cervical adenopathy.  Neurological: She is alert. Coordination normal.  Skin: Skin is warm and dry. No rash noted. No erythema.  Psychiatric: She has a normal mood and affect. Her behavior is normal.  Nursing note and vitals reviewed.  ED Treatments / Results  DIAGNOSTIC  STUDIES: Oxygen Saturation is 100% on RA, normal by my interpretation.   COORDINATION OF CARE: 4:20 PM-Discussed next steps with pt. Pt verbalized understanding and is agreeable with the plan.   Labs (all labs ordered are listed, but only abnormal results are displayed) Labs Reviewed  BASIC METABOLIC PANEL - Abnormal; Notable for the following:       Result Value   Glucose, Bld 112 (*)    Creatinine, Ser 1.57 (*)    GFR calc non Af Amer 30 (*)    GFR calc Af Amer 35 (*)    All other components within normal limits  CBG MONITORING, ED - Abnormal; Notable for the following:    Glucose-Capillary 103 (*)    All other components within normal limits  CBC  URINALYSIS, ROUTINE W REFLEX MICROSCOPIC (NOT AT [facility])   EKG  EKG Interpretation  Date/Time:  Sunday July 08 2016 14:56:54 EDT Ventricular Rate:  72 PR Interval:    QRS Duration: 143 QT Interval:  430 QTC Calculation: 471 R Axis:   29 Text Interpretation:  Sinus rhythm Prolonged PR interval Right bundle branch block Baseline wander in lead(s) V2 Confirmed by Bostock Singer  MD, Paton ([id number]) on 07/08/2016 4:09:40 PM       Radiology No results found.  Procedures Procedures (including critical care time)  Medications Ordered in ED Medications - No data to display   Initial Impression / Assessment and Plan / ED Course  I have reviewed the triage vital signs and the nursing notes.  Pertinent labs & imaging results that were available during my care of the patient were reviewed by me and considered in my medical decision making (see chart for details).  Clinical Course     Final Clinical Impressions(s) / ED Diagnoses   Final diagnoses:  Syncope and collapse    New Prescriptions New Prescriptions   No medications on file    I personally preformed the services scribed in my presence. The recorded information has been reviewed is accurate. Virgel Manifold, MD.     Virgel Manifold, MD 07/22/16 1140

## 2016-07-12 ENCOUNTER — Encounter: Payer: Self-pay | Admitting: Internal Medicine

## 2016-07-12 ENCOUNTER — Ambulatory Visit (INDEPENDENT_AMBULATORY_CARE_PROVIDER_SITE_OTHER): Payer: Medicare Other | Admitting: Internal Medicine

## 2016-07-12 VITALS — BP 130/70 | HR 43 | Temp 97.6°F | Resp 20 | Wt 183.0 lb

## 2016-07-12 DIAGNOSIS — N182 Chronic kidney disease, stage 2 (mild): Secondary | ICD-10-CM

## 2016-07-12 DIAGNOSIS — R55 Syncope and collapse: Secondary | ICD-10-CM

## 2016-07-12 DIAGNOSIS — I5189 Other ill-defined heart diseases: Secondary | ICD-10-CM | POA: Insufficient documentation

## 2016-07-12 DIAGNOSIS — I10 Essential (primary) hypertension: Secondary | ICD-10-CM | POA: Diagnosis not present

## 2016-07-12 NOTE — Progress Notes (Signed)
Pre visit review using our clinic review tool, if applicable. No additional management support is needed unless otherwise documented below in the visit note. 

## 2016-07-12 NOTE — Assessment & Plan Note (Signed)
stable overall by history and exam, recent data reviewed with pt, and pt to continue medical treatment as before,  to f/u any worsening symptoms or concerns BP Readings from Last 3 Encounters:  07/12/16 130/70  07/08/16 119/69  10/10/15 112/80

## 2016-07-12 NOTE — Assessment & Plan Note (Signed)
stable overall by history and exam, recent data reviewed with pt, and pt to continue medical treatment as before,  to f/u any worsening symptoms or concerns Lab Results  Component Value Date   CREATININE 1.57 (H) 07/08/2016

## 2016-07-12 NOTE — Progress Notes (Signed)
Subjective:    Patient ID: Kylie Patrick, female    DOB: August 26, 1936, 80 y.o.   MRN: [id number]  HPI  Here after episode of syncope aug 27, occurred with riding in car, felt dizzy then noted to have LOC per husband, and occurred twice more on drive home, each time about 2 sec.  Had n/v with several dry heaves after got home, No n/v since then. No CP, sob, diaphoresis.  Did have marked palptiations :my heart was flying" after got home and laid down on the bed. BP was very high on arrival then to ED at abot 99991111 systolic. ECG there with NSR, 1st deg AVB and RBBB., no acute changes.   Pt denies fever, ST, cough, ear pain but has some ringing to the ears occasionally, No wt loss, loss of appetite, or other constitutional symptoms.  Last stress test Oct 11, 2009 - neg for ischemia.  Has ongoing night sweats ongoing for many months, gets hot at night, now worsening recently. Denies worsening reflux, abd pain, dysphagia, bowel change or blood.  No siezure activity noted Wt Readings from Last 3 Encounters:  07/12/16 183 lb (83 kg)  07/08/16 180 lb (81.6 kg)  10/10/15 190 lb 4 oz (86.3 kg)   Past Medical History:  Diagnosis Date  . Anxiety   . Colon polyp 2003  . Complication of anesthesia    slow to wake up one time  . Gastroenteritis    w/ renal insufficiency in context of protracted nausea & vomitting 2006  . GERD (gastroesophageal reflux disease)   . H/O hiatal hernia   . Headache(784.0)    occasional  . Hyperlipidemia   . Hypertension    w/ LVH on ECHO  . Tonsillitis 2006   Past Surgical History:  Procedure Laterality Date  . ABDOMINAL HYSTERECTOMY  1976   BSO for endometriosis and bengin tumor  . APPENDECTOMY  1970  . BREAST LUMPECTOMY Left 70's   X 2  . BUNIONECTOMY Bilateral   . CATARACT EXTRACTION W/PHACO Left 05/14/2013   Procedure: CATARACT EXTRACTION PHACO AND INTRAOCULAR LENS PLACEMENT (IOC);  Surgeon: Tonny Branch, MD;  Location: [facility];  Service: Ophthalmology;  Laterality:  Left;  CDE:9.76  . CATARACT EXTRACTION W/PHACO Right 06/08/2013   Procedure: CATARACT EXTRACTION PHACO AND INTRAOCULAR LENS PLACEMENT (IOC);  Surgeon: Tonny Branch, MD;  Location: [facility];  Service: Ophthalmology;  Laterality: Right;  CDE 13.65  . CHOLECYSTECTOMY  1970  . COLONOSCOPY W/ POLYPECTOMY  2003   Dr [name]  . ESOPHAGEAL MANOMETRY N/A 10/05/2013   Procedure: ESOPHAGEAL MANOMETRY (EM);  Surgeon: Gatha Mayer, MD;  Location: [facility];  Service: Endoscopy;  Laterality: N/A;  . ESOPHAGOGASTRODUODENOSCOPY  02/05/13   Large Hiatal Hernia  . HERNIA REPAIR    . LAPAROSCOPIC NISSEN FUNDOPLICATION Bilateral 99991111   Procedure: LAPAROSCOPIC NISSEN FUNDOPLICATION with hiatal hernia repair ;  Surgeon: Edward Jolly, MD;  Location: [facility];  Service: General;  Laterality: Bilateral;    reports that she has never smoked. She has never used smokeless tobacco. She reports that she drinks alcohol. She reports that she does not use drugs. family history includes Aneurysm in her paternal grandmother; Breast cancer in her maternal aunt; Coronary artery disease in her maternal grandfather and mother; Diabetes in her maternal aunt and mother; Heart attack (age of onset: 39) in her father; Heart disease in her sister; Stomach cancer in her maternal aunt; Stroke in her maternal grandmother and paternal grandfather. Allergies  Allergen Reactions  .  Chlorthalidone     01/09/13 creatinine 2.2  . Hydrochlorothiazide     Hypokalemia  . Losartan Potassium-Hctz     Hypokalemia  . Norvasc [Amlodipine Besylate]     Edema  . Raloxifene     HTN  . Rofecoxib     HTN   Current Outpatient Prescriptions on File Prior to Visit  Medication Sig Dispense Refill  . aspirin 81 MG tablet Take 81 mg by mouth daily.      . Calcium in Bone Mineral Cmplx 350 MG MISC Take 1-2 each by mouth 2 (two) times daily. Takes 1 in the morning and 2 at night    . carvedilol (COREG) 25 MG tablet TAKE 1/2 TABLET BY MOUTH TWICE  A DAY 180 tablet 3  . Cholecalciferol (VITAMIN D3) 2000 UNITS TABS Take 1 tablet by mouth daily.     . folic acid (FOLVITE) A999333 MCG tablet Take 400 mcg by mouth every morning.    . furosemide (LASIX) 40 MG tablet TAKE 1 TABLET (40 MG TOTAL) BY MOUTH EVERY MORNING. 90 tablet 1  . gabapentin (NEURONTIN) 100 MG capsule Take 1 capsule (100 mg total) by mouth every 8 (eight) hours as needed (pain). 30 capsule 2  . Magnesium 400 MG CAPS Take 400 mg by mouth daily.     . Omega-3 Fatty Acids (FISH OIL) 500 MG CAPS Take 500 mg by mouth daily.     Marland Kitchen omeprazole-sodium bicarbonate (ZEGERID) 40-1100 MG capsule TAKE ONE CAPSULE BY MOUTH TWICE A DAY 180 capsule 1  . valsartan (DIOVAN) 320 MG tablet TAKE 1 TABLET EVERY DAY 90 tablet 3  . VYTORIN 10-20 MG tablet TAKE 1 TABLET BY MOUTH AT BEDTIME. --- PLEASE ESTABLISH WITH NEW PCP FOR FURTHER REFILLS. 90 tablet 2   No current facility-administered medications on file prior to visit.      Review of Systems  Constitutional: Negative for unusual diaphoresis  HENT: Negative for ear swelling or discharge Eyes: Negative for worsening visual haziness  Respiratory: Negative for choking and stridor.   Gastrointestinal: Negative for distension or worsening eructation Genitourinary: Negative for retention or change in urine volume.  Musculoskeletal: Negative for other MSK pain or swelling Skin: Negative for color change and worsening wound Neurological: Negative for tremors and numbness other than noted  Psychiatric/Behavioral: Negative for decreased concentration or agitation other than above       Objective:   Physical Exam BP 130/70   Pulse (!) 43   Temp 97.6 F (36.4 C) (Oral)   Resp 20   Wt 183 lb (83 kg)   SpO2 98%   BMI 35.74 kg/m  VS noted,  Constitutional: Pt appears in no apparent distress HENT: Head: NCAT.  Right Ear: External ear normal.  Left Ear: External ear normal.  Eyes: . Pupils are equal, round, and reactive to light. Conjunctivae  and EOM are normal Neck: Normal range of motion. Neck supple.  Cardiovascular: Normal rate and regular rhythm.   Pulmonary/Chest: Effort normal and breath sounds without rales or wheezing.  Abd:  Soft, NT, ND, + BS Neurological: Pt is alert. Not confused , motor grossly intact Skin: Skin is warm. No rash, no LE edema Psychiatric: Pt behavior is normal. No agitation.   . Lab Results  Component Value Date   WBC 6.7 07/08/2016   HGB 12.8 07/08/2016   HCT 39.6 07/08/2016   PLT 251 07/08/2016   GLUCOSE 112 (H) 07/08/2016   CHOL 146 10/25/2015   TRIG 114.0 10/25/2015  HDL 47.70 10/25/2015   LDLCALC 76 10/25/2015   ALT 17 10/25/2015   AST 23 10/25/2015   NA 140 07/08/2016   K 3.7 07/08/2016   CL 107 07/08/2016   CREATININE 1.57 (H) 07/08/2016   BUN 18 07/08/2016   CO2 26 07/08/2016   TSH 1.70 10/25/2015   HGBA1C 6.0 10/25/2015   MICROALBUR 2.1 (H) 10/02/2010  CHEST  2 VIEW IMPRESSION: No active cardiopulmonary disease.  Borderline stable cardiomegaly. Electronically Signed   By: Marin Olp M.D.    On: 10/10/2015 15:20  Transthoracic Echocardiography 10-18-2015 Study Conclusions  - Left ventricle: The cavity size was normal. Systolic function was   normal. The estimated ejection fraction was in the range of 55%   to 60%. Wall motion was normal; there were no regional wall   motion abnormalities. There was an increased relative   contribution of atrial contraction to ventricular filling.   Doppler parameters are consistent with abnormal left ventricular   relaxation (grade 1 diastolic dysfunction). - Aortic valve: Moderate diffuse thickening and calcification,   consistent with sclerosis. - Mitral valve: Calcified annulus. There was trivial regurgitation.   CT HEAD 04-05-10 IMPRESSION:   1.  Atrophy.  No acute intracranial abnormality. 2.  Air-fluid level in the right maxillary sinus.  CT MAXILLOFACIAL 04-05-10 1.  Air-fluid level in the right maxillary sinus  of high attenuation consistent with blood. 2.  Small amount of air just peripheral to the right maxillary posterior sinus wall suggests a nondisplaced fracture of the right maxillary sinus.    Assessment & Plan:

## 2016-07-12 NOTE — Assessment & Plan Note (Addendum)
Etiology unclear, but suspect issue with vertigo assoc with n/v then vagal episode, but not able to say definitively; for today will need head MRI, carotid dopplers, and cardiac event monitor given the palpiations, f/u with PCP in 3 wks; has seen Dr Harrington Challenger in past, pt delcines cardiology f/u or repeat stress testing for now\ Note:  Total time for pt hx, exam, review of record with pt in the room, determination of diagnoses and plan for further eval and tx is > 40 min, with over 50% spent in coordination and counseling of patient

## 2016-07-12 NOTE — Patient Instructions (Signed)
Please continue all other medications as before, and refills have been done if requested.  Please have the pharmacy call with any other refills you may need.  Please continue your efforts at being more active, low cholesterol diet, and weight control.  You are otherwise up to date with prevention measures today.  Please keep your appointments with your specialists as you may have planned  You will be contacted regarding the referral for: MRI head, carotid dopplers, and cardiac event monitor  Please see your PCP, Dr Quay Burow in 4 weeks

## 2016-07-13 DIAGNOSIS — Z950 Presence of cardiac pacemaker: Secondary | ICD-10-CM

## 2016-07-13 HISTORY — DX: Presence of cardiac pacemaker: Z95.0

## 2016-07-17 ENCOUNTER — Emergency Department (HOSPITAL_COMMUNITY): Payer: Medicare Other

## 2016-07-17 ENCOUNTER — Telehealth: Payer: Self-pay | Admitting: Internal Medicine

## 2016-07-17 ENCOUNTER — Inpatient Hospital Stay (HOSPITAL_COMMUNITY)
Admission: EM | Admit: 2016-07-17 | Discharge: 2016-07-19 | DRG: 244 | Disposition: A | Discharge status: Home / Self Care | Payer: Medicare Other | Attending: Cardiovascular Disease | Admitting: Cardiovascular Disease

## 2016-07-17 ENCOUNTER — Encounter (HOSPITAL_COMMUNITY): Payer: Self-pay | Admitting: Emergency Medicine

## 2016-07-17 DIAGNOSIS — R0602 Shortness of breath: Secondary | ICD-10-CM | POA: Diagnosis present

## 2016-07-17 DIAGNOSIS — Z833 Family history of diabetes mellitus: Secondary | ICD-10-CM

## 2016-07-17 DIAGNOSIS — Z7982 Long term (current) use of aspirin: Secondary | ICD-10-CM

## 2016-07-17 DIAGNOSIS — I4510 Unspecified right bundle-branch block: Secondary | ICD-10-CM | POA: Diagnosis present

## 2016-07-17 DIAGNOSIS — I441 Atrioventricular block, second degree: Principal | ICD-10-CM

## 2016-07-17 DIAGNOSIS — R001 Bradycardia, unspecified: Secondary | ICD-10-CM

## 2016-07-17 DIAGNOSIS — I10 Essential (primary) hypertension: Secondary | ICD-10-CM | POA: Diagnosis present

## 2016-07-17 DIAGNOSIS — E876 Hypokalemia: Secondary | ICD-10-CM | POA: Diagnosis present

## 2016-07-17 DIAGNOSIS — F419 Anxiety disorder, unspecified: Secondary | ICD-10-CM | POA: Diagnosis present

## 2016-07-17 DIAGNOSIS — E785 Hyperlipidemia, unspecified: Secondary | ICD-10-CM | POA: Diagnosis present

## 2016-07-17 DIAGNOSIS — Z8249 Family history of ischemic heart disease and other diseases of the circulatory system: Secondary | ICD-10-CM | POA: Diagnosis not present

## 2016-07-17 DIAGNOSIS — Z9049 Acquired absence of other specified parts of digestive tract: Secondary | ICD-10-CM | POA: Diagnosis not present

## 2016-07-17 DIAGNOSIS — M2762 Post-osseointegration biological failure of dental implant: Secondary | ICD-10-CM

## 2016-07-17 DIAGNOSIS — K219 Gastro-esophageal reflux disease without esophagitis: Secondary | ICD-10-CM | POA: Diagnosis present

## 2016-07-17 DIAGNOSIS — Z888 Allergy status to other drugs, medicaments and biological substances status: Secondary | ICD-10-CM

## 2016-07-17 DIAGNOSIS — Z79899 Other long term (current) drug therapy: Secondary | ICD-10-CM

## 2016-07-17 DIAGNOSIS — E8881 Metabolic syndrome: Secondary | ICD-10-CM | POA: Diagnosis not present

## 2016-07-17 LAB — I-STAT TROPONIN, ED: TROPONIN I, POC: 0 ng/mL (ref 0.00–0.08)

## 2016-07-17 LAB — BASIC METABOLIC PANEL
ANION GAP: 8 (ref 5–15)
BUN: 20 mg/dL (ref 6–20)
CALCIUM: 9.4 mg/dL (ref 8.9–10.3)
CO2: 27 mmol/L (ref 22–32)
Chloride: 108 mmol/L (ref 101–111)
Creatinine, Ser: 1.8 mg/dL — ABNORMAL HIGH (ref 0.44–1.00)
GFR calc Af Amer: 30 mL/min — ABNORMAL LOW (ref >60)
GFR calc non Af Amer: 26 mL/min — ABNORMAL LOW (ref >60)
GLUCOSE: 112 mg/dL — AB (ref 65–99)
Potassium: 3.7 mmol/L (ref 3.5–5.1)
Sodium: 143 mmol/L (ref 135–145)

## 2016-07-17 LAB — CBC
HEMATOCRIT: 39 % (ref 36.0–46.0)
HEMOGLOBIN: 12.7 g/dL (ref 12.0–15.0)
MCH: 28.3 pg (ref 26.0–34.0)
MCHC: 32.6 g/dL (ref 30.0–36.0)
MCV: 86.9 fL (ref 78.0–100.0)
Platelets: 227 10*3/uL (ref 150–400)
RBC: 4.49 MIL/uL (ref 3.87–5.11)
RDW: 14.5 % (ref 11.5–15.5)
WBC: 7.1 10*3/uL (ref 4.0–10.5)

## 2016-07-17 MED ORDER — ENOXAPARIN SODIUM 40 MG/0.4ML ~~LOC~~ SOLN: 40.0000 mg | SUBCUTANEOUS | Status: DC

## 2016-07-17 MED ORDER — EZETIMIBE-SIMVASTATIN 10-20 MG PO TABS
1.0000 | ORAL_TABLET | Freq: Every day | ORAL | Status: DC
  Administered 2016-07-18: 1 via ORAL
  Filled 2016-07-17: qty 1

## 2016-07-17 MED ORDER — SODIUM CHLORIDE 0.9% FLUSH
3.0000 mL | Freq: Two times a day (BID) | INTRAVENOUS | Status: DC
  Administered 2016-07-17 – 2016-07-19 (×4): 3 mL via INTRAVENOUS

## 2016-07-17 MED ORDER — ASPIRIN EC 81 MG PO TBEC
81.0000 mg | DELAYED_RELEASE_TABLET | Freq: Every day | ORAL | Status: DC
  Administered 2016-07-17 – 2016-07-18 (×2): 81 mg via ORAL
  Filled 2016-07-17 (×2): qty 1

## 2016-07-17 NOTE — ED Triage Notes (Signed)
Patient reports shortness of breath with any exertion since Saturday. Denies chest pain.

## 2016-07-17 NOTE — H&P (Signed)
Cardiology History and Physical     Patient ID: Kylie Patrick MRN: [id number], DOB/AGE: 78-Oct-1937   Admit date: 07/17/2016 Date of Admission: 07/17/2016  Primary Physician: Cathlean Cower, MD Reason for Admission: Symptomatic bradycardia Primary Cardiologist: Dr. Harrington Challenger   History of Present Illness    Kylie Patrick is a 80 y.o. female with past medical history of bradycardia, HTN, HLD, and normal NST in 2010 who presents to [facility] ED on 07/17/2016 for evaluation of dyspnea with exertion.    She was recently seen in the ED on 8/27 for 3 episodes of syncope and had prodromal symptoms of dizziness. Says she was sitting down with these episodes and her husband noted she lost consciousness for a few seconds. EKG at that time showed NSR with 1st degree AV block, HR 72, with RBBB. She was seen by her PCP who recommended a cardiac event monitor.   Since then, she denies any repeat episodes of syncope. She does report having worsening dyspnea with exertion for the past several days. Says she can barely walk a few feet without having to stop and take a break to catch her breath. Denies any chest discomfort or palpitations. No repeat episodes of dizziness or presyncope.   While in the ED, labs show a WBC of 7.1, Hgb 12.7, and platelets 227. K+ 3.7. Creatinine 1.80 (was 1.57 nine days ago). Initial troponin negative. CXR with no acute cardiopulmonary disease and stable mild cardiomegaly without pulmonary edema. EKG today shows sinus bradycardia, HR 47, with RBBB.   On telemetry, her HR is maintaining in the mid to high-30's. There is concern for dropped p-waves which would be consistent with 2nd degree, Type 2 heart block. She is on Coreg 12.5mg  BID and last took the medication around 0800 this morning. Denies any recent dose adjustments. No new medications or supplements.   Past Medical History   Past Medical History:  Diagnosis Date  . Anxiety   . Colon polyp 2003  . Complication of anesthesia     slow to wake up one time  . Gastroenteritis    w/ renal insufficiency in context of protracted nausea & vomitting 2006  . GERD (gastroesophageal reflux disease)   . H/O hiatal hernia   . Headache(784.0)    occasional  . Hyperlipidemia   . Hypertension    w/ LVH on ECHO  . Tonsillitis 2006    Past Surgical History:  Procedure Laterality Date  . ABDOMINAL HYSTERECTOMY  1976   BSO for endometriosis and bengin tumor  . APPENDECTOMY  1970  . BREAST LUMPECTOMY Left 70's   X 2  . BUNIONECTOMY Bilateral   . CATARACT EXTRACTION W/PHACO Left 05/14/2013   Procedure: CATARACT EXTRACTION PHACO AND INTRAOCULAR LENS PLACEMENT (IOC);  Surgeon: Tonny Branch, MD;  Location: [facility];  Service: Ophthalmology;  Laterality: Left;  CDE:9.76  . CATARACT EXTRACTION W/PHACO Right 06/08/2013   Procedure: CATARACT EXTRACTION PHACO AND INTRAOCULAR LENS PLACEMENT (IOC);  Surgeon: Tonny Branch, MD;  Location: [facility];  Service: Ophthalmology;  Laterality: Right;  CDE 13.65  . CHOLECYSTECTOMY  1970  . COLONOSCOPY W/ POLYPECTOMY  2003   Dr [name]  . ESOPHAGEAL MANOMETRY N/A 10/05/2013   Procedure: ESOPHAGEAL MANOMETRY (EM);  Surgeon: Gatha Mayer, MD;  Location: [facility];  Service: Endoscopy;  Laterality: N/A;  . ESOPHAGOGASTRODUODENOSCOPY  02/05/13   Large Hiatal Hernia  . HERNIA REPAIR    . LAPAROSCOPIC NISSEN FUNDOPLICATION Bilateral 99991111   Procedure: LAPAROSCOPIC NISSEN FUNDOPLICATION with  hiatal hernia repair ;  Surgeon: Edward Jolly, MD;  Location: [facility];  Service: General;  Laterality: Bilateral;     Allergies  Allergies  Allergen Reactions  . Chlorthalidone     01/09/13 creatinine 2.2  . Hydrochlorothiazide     Hypokalemia  . Losartan Potassium-Hctz     Hypokalemia  . Norvasc [Amlodipine Besylate]     Edema  . Raloxifene     HTN  . Rofecoxib     HTN   OUtpatient meds:  Current Meds  Medication Sig  . aspirin 81 MG tablet Take 81 mg by mouth at bedtime.   . Calcium in Bone  Mineral Cmplx 350 MG MISC Take 3 each by mouth at bedtime. Takes 1 in the morning and 2 at night  . carvedilol (COREG) 25 MG tablet TAKE 1/2 TABLET BY MOUTH TWICE A DAY (Patient taking differently: TAKE 12.5mg  TABLET BY MOUTH TWICE A DAY)  . Cholecalciferol (VITAMIN D3) 2000 UNITS TABS Take 1 tablet by mouth daily with lunch.   . Cyanocobalamin (VITAMIN B 12 PO) Take 1 tablet by mouth daily with lunch.  . folic acid (FOLVITE) A999333 MCG tablet Take 400 mcg by mouth every morning.  . furosemide (LASIX) 40 MG tablet TAKE 1 TABLET (40 MG TOTAL) BY MOUTH EVERY MORNING.  Marland Kitchen gabapentin (NEURONTIN) 100 MG capsule Take 1 capsule (100 mg total) by mouth every 8 (eight) hours as needed (pain).  . Magnesium 400 MG CAPS Take 400 mg by mouth at bedtime.   . Omega-3 Fatty Acids (FISH OIL) 500 MG CAPS Take 500 mg by mouth daily with lunch.   Marland Kitchen omeprazole-sodium bicarbonate (ZEGERID) 40-1100 MG capsule TAKE ONE CAPSULE BY MOUTH TWICE A DAY  . valsartan (DIOVAN) 320 MG tablet TAKE 1 TABLET EVERY DAY (Patient taking differently: TAKE 320mg  TABLET BY MOUTH ONCE DAILY)  . VYTORIN 10-20 MG tablet TAKE 1 TABLET BY MOUTH AT BEDTIME. --- PLEASE ESTABLISH WITH NEW PCP FOR FURTHER REFILLS.    Inpatient Medications      Family History    Family History  Problem Relation Age of Onset  . Heart attack Father 64    fatal MI @ 37  . Coronary artery disease Mother     CABG; MI @ 2  . Diabetes Mother   . Stroke Paternal Grandfather     > 27  . Stroke Maternal Grandmother     in 69s  . Aneurysm Paternal Grandmother     cns ; in 57s  . Diabetes Maternal Aunt   . Stomach cancer Maternal Aunt   . Breast cancer Maternal Aunt   . Heart disease Sister   . Coronary artery disease Maternal Grandfather     Social History    Social History   Social History  . Marital status: Married    Spouse name: N/A  . Number of children: 2  . Years of education: N/A   Occupational History  . Retired Retired   Social History  Main Topics  . Smoking status: Never Smoker  . Smokeless tobacco: Never Used  . Alcohol use Yes     Comment: Wine-VERY RARELY  . Drug use: No  . Sexual activity: Not Currently   Other Topics Concern  . Not on file   Social History Narrative  . No narrative on file     Review of Systems    General:  No chills, fever, night sweats or weight changes.  Cardiovascular:  No chest pain, edema, orthopnea, palpitations,  paroxysmal nocturnal dyspnea. Positive for dyspnea with exertion.  Dermatological: No rash, lesions/masses Respiratory: No cough, dyspnea Urologic: No hematuria, dysuria Abdominal:   No nausea, vomiting, diarrhea, bright red blood per rectum, melena, or hematemesis Neurologic:  No visual changes, wkns, changes in mental status. Positive for dizziness and syncope.  All other systems reviewed and are otherwise negative except as noted above.  Physical Exam    Blood pressure 144/60, pulse (!) 40, temperature 98.2 F (36.8 C), temperature source Oral, resp. rate 16, height 5' (1.524 m), weight 183 lb (83 kg), SpO2 93 %.  General: Pleasant, elderly Caucasian female appearing in NAD Psych: Normal affect. Neuro: Alert and oriented X 3. Moves all extremities spontaneously. HEENT: Normal  Neck: Supple without bruits or JVD. Lungs:  Resp regular and unlabored, CTA without wheezing or rales. Heart: Regular rhythm, bradycardiac rate,  no s3, s4, or murmurs. Abdomen: Soft, non-tender, non-distended, BS + x 4.  Extremities: No clubbing, cyanosis or edema. DP/PT/Radials 2+ and equal bilaterally.  Labs    Troponin [facility] Care Test)  Recent Labs  07/17/16 1443  TROPIPOC 0.00   No results for input(s): CKTOTAL, CKMB, TROPONINI in the last 72 hours. Lab Results  Component Value Date   WBC 7.1 07/17/2016   HGB 12.7 07/17/2016   HCT 39.0 07/17/2016   MCV 86.9 07/17/2016   PLT 227 07/17/2016    Recent Labs Lab 07/17/16 1420  NA 143  K 3.7  CL 108  CO2 27  BUN 20   CREATININE 1.80*  CALCIUM 9.4  GLUCOSE 112*   Lab Results  Component Value Date   CHOL 146 10/25/2015   HDL 47.70 10/25/2015   LDLCALC 76 10/25/2015   TRIG 114.0 10/25/2015   No results found for: [facility]   Radiology Studies    Dg Chest Port 1 View  Result Date: 07/17/2016 CLINICAL DATA:  Three-day history of shortness of breath with exertion. Current history of hypertension. EXAM: PORTABLE CHEST 1 VIEW COMPARISON:  10/10/2015, 10/19/2013. FINDINGS: Cardiac silhouette mildly enlarged, unchanged. Thoracic aorta mildly tortuous and atherosclerotic, unchanged. Hilar and mediastinal contours otherwise unremarkable. Lungs clear. Bronchovascular markings normal. Pulmonary vascularity normal. No visible pleural effusions. No pneumothorax. IMPRESSION: 1.  No acute cardiopulmonary disease. 2. Stable mild cardiomegaly without pulmonary edema. 3. Mild thoracic aortic atherosclerosis. Electronically Signed   By: Evangeline Dakin M.D.   On: 07/17/2016 14:56    EKG & Cardiac Imaging    EKG: Sinus bradycardia, HR 47, with RBBB.  Echocardiogram: 10/2015 Study Conclusions  - Left ventricle: The cavity size was normal. Systolic function was   normal. The estimated ejection fraction was in the range of 55%   to 60%. Wall motion was normal; there were no regional wall   motion abnormalities. There was an increased relative   contribution of atrial contraction to ventricular filling.   Doppler parameters are consistent with abnormal left ventricular   relaxation (grade 1 diastolic dysfunction). - Aortic valve: Moderate diffuse thickening and calcification,   consistent with sclerosis. - Mitral valve: Calcified annulus. There was trivial regurgitation.  Assessment & Plan    1. Symptomatic Bradycardia - seen in the ED on 8/27 for 3 syncopal episodes and had prodromal symptoms of dizziness. EKG at that time showed NSR with 1st degree AV block, HR 72, with RBBB. She was seen by her PCP who  recommended a cardiac event monitor (had not received this yet).  - since then, she reports having worsening dyspnea with exertion for the  past several days. Denies any chest discomfort, palpitations, dizziness, presyncope, or repeat syncopal episodes since recent ED visit.  - EKG today shows sinus bradycardia, HR 47, with RBBB. On telemetry, her HR is maintaining in the mid to high-30's. There is concern for dropped p-waves which would be consistent with 2nd degree, Type 2 heart block. She is on Coreg 12.5mg  BID and last took the medication around 0800 this morning.  - will hold Coreg for wash-out period. If she continues to have bradycardia or heart block, will need EP consult for PPM placement. Will transfer to [facility] step-down unit.   2. HTN - BP stable while in the ED. Will hold Coreg on admission.   3. HLD - continue Vytorin.   Signed, Kylie Heritage, PA-C 07/17/2016, 4:19 PM Pager: [phone]  I have personally seen and examined this patient with Bernerd Pho, PA-C. I agree with the assessment and plan as outlined above. She is admitted with weakness, fatigue and dyspnea. She is found to have a HR 38040 bpm. EKG with Second degree AV block. She is hemodynamically stable. She is asymptomatic while resting in bed. My exam shows a comfortable elderly female, alert and oriented x 3. [id number], regular. No loud murmurs. Lungs: clear bilaterally. Ext: no edema. Labs reviewed. Her baseline creatinine is 1.4-1.5. Mildly elevated above baseline today. EKG reviewed by me today and as above, this shows sinus with 2:1 AV block.  Plan to admit to [facility] on stepdown. No urgent indication for temporary pacemaker tonight as her BP is stable and she is asymptomatic while lying in bed. Will hold Coreg and allow for washout. If her heart block does not resolve, will ask EP to see tomorrow to discuss permanent pacemaker. Hold Lasix and ARB with worsened renal insufficiency.   Lauree Chandler 07/17/2016 5:18 PM

## 2016-07-17 NOTE — ED Provider Notes (Signed)
[facility] DEPT Provider Note   CSN: [id number] Arrival date & time: 07/17/16  1354     History   Chief Complaint Chief Complaint  Patient presents with  . Shortness of Breath  . Bradycardia    35 HR    HPI Kylie Patrick is a 80 y.o. female.  The history is provided by the patient. No language interpreter was used.  Shortness of Breath     Kylie Patrick is a 80 y.o. female who presents to the Emergency Department complaining of sob.  She reports 3-4 days of progressive, profound DOE.  A week ago she syncopized three times and was evaluated in the ED.  No fevers, chest pain, diaphoresis, N/V, leg swelling or pain.  She takes coreg daily.  She has no hx/o CAD but she has a family hx/o heart disease.    Past Medical History:  Diagnosis Date  . Anxiety   . Colon polyp 2003  . Complication of anesthesia    slow to wake up one time  . Gastroenteritis    w/ renal insufficiency in context of protracted nausea & vomitting 2006  . GERD (gastroesophageal reflux disease)   . H/O hiatal hernia   . Headache(784.0)    occasional  . Hyperlipidemia   . Hypertension    w/ LVH on ECHO  . Tonsillitis 2006    Patient Active Problem List   Diagnosis Date Noted  . Diastolic dysfunction 123456  . Hyperglycemia 10/09/2014  . Syncope 06/14/2014  . GERD (gastroesophageal reflux disease) 10/22/2013  . Hiatal hernia 08/03/2013  . Nonspecific abnormal electrocardiogram (ECG) (EKG) 01/09/2013  . Personal history of colonic polyps 01/09/2013  . RENAL DISEASE, CHRONIC, MILD 05/05/2008  . Anxiety state, unspecified 04/20/2008  . NIGHT SWEATS 02/19/2008  . HYPERLIPIDEMIA 08/12/2007  . DISORDER, DYSMETABOLIC SYNDROME X [id number]  . Essential hypertension 08/12/2007  . ACID REFLUX DISEASE 08/12/2007    Past Surgical History:  Procedure Laterality Date  . ABDOMINAL HYSTERECTOMY  1976   BSO for endometriosis and bengin tumor  . APPENDECTOMY  1970  . BREAST LUMPECTOMY Left 70's    X 2  . BUNIONECTOMY Bilateral   . CATARACT EXTRACTION W/PHACO Left 05/14/2013   Procedure: CATARACT EXTRACTION PHACO AND INTRAOCULAR LENS PLACEMENT (IOC);  Surgeon: Tonny Branch, MD;  Location: [facility];  Service: Ophthalmology;  Laterality: Left;  CDE:9.76  . CATARACT EXTRACTION W/PHACO Right 06/08/2013   Procedure: CATARACT EXTRACTION PHACO AND INTRAOCULAR LENS PLACEMENT (IOC);  Surgeon: Tonny Branch, MD;  Location: [facility];  Service: Ophthalmology;  Laterality: Right;  CDE 13.65  . CHOLECYSTECTOMY  1970  . COLONOSCOPY W/ POLYPECTOMY  2003   Dr [name]  . ESOPHAGEAL MANOMETRY N/A 10/05/2013   Procedure: ESOPHAGEAL MANOMETRY (EM);  Surgeon: Gatha Mayer, MD;  Location: [facility];  Service: Endoscopy;  Laterality: N/A;  . ESOPHAGOGASTRODUODENOSCOPY  02/05/13   Large Hiatal Hernia  . HERNIA REPAIR    . LAPAROSCOPIC NISSEN FUNDOPLICATION Bilateral 99991111   Procedure: LAPAROSCOPIC NISSEN FUNDOPLICATION with hiatal hernia repair ;  Surgeon: Edward Jolly, MD;  Location: [facility];  Service: General;  Laterality: Bilateral;    OB History    No data available       Home Medications    Prior to Admission medications   Medication Sig Start Date End Date Taking? Authorizing Provider  aspirin 81 MG tablet Take 81 mg by mouth at bedtime.    Yes Historical Provider, MD  Calcium in Bone Mineral Cmplx 350 MG  MISC Take 3 each by mouth at bedtime. Takes 1 in the morning and 2 at night   Yes Historical Provider, MD  carvedilol (COREG) 25 MG tablet TAKE 1/2 TABLET BY MOUTH TWICE A DAY Patient taking differently: TAKE 12.5mg  TABLET BY MOUTH TWICE A DAY 11/09/15  Yes Binnie Rail, MD  Cholecalciferol (VITAMIN D3) 2000 UNITS TABS Take 1 tablet by mouth daily with lunch.    Yes Historical Provider, MD  Cyanocobalamin (VITAMIN B 12 PO) Take 1 tablet by mouth daily with lunch.   Yes Historical Provider, MD  folic acid (FOLVITE) A999333 MCG tablet Take 400 mcg by mouth every morning.   Yes Historical  Provider, MD  furosemide (LASIX) 40 MG tablet TAKE 1 TABLET (40 MG TOTAL) BY MOUTH EVERY MORNING. 03/12/16  Yes Binnie Rail, MD  gabapentin (NEURONTIN) 100 MG capsule Take 1 capsule (100 mg total) by mouth every 8 (eight) hours as needed (pain). 06/14/14  Yes Hendricks Limes, MD  Magnesium 400 MG CAPS Take 400 mg by mouth at bedtime.    Yes Historical Provider, MD  Omega-3 Fatty Acids (FISH OIL) 500 MG CAPS Take 500 mg by mouth daily with lunch.    Yes Historical Provider, MD  omeprazole-sodium bicarbonate (ZEGERID) 40-1100 MG capsule TAKE ONE CAPSULE BY MOUTH TWICE A DAY 01/18/16  Yes Gatha Mayer, MD  valsartan (DIOVAN) 320 MG tablet TAKE 1 TABLET EVERY DAY Patient taking differently: TAKE 320mg  TABLET BY MOUTH ONCE DAILY 12/05/15  Yes Binnie Rail, MD  VYTORIN 10-20 MG tablet TAKE 1 TABLET BY MOUTH AT BEDTIME. --- PLEASE ESTABLISH WITH NEW PCP FOR FURTHER REFILLS. 01/04/16  Yes Binnie Rail, MD    Family History Family History  Problem Relation Age of Onset  . Heart attack Father 71    fatal MI @ 7  . Coronary artery disease Mother     CABG; MI @ 33  . Diabetes Mother   . Stroke Paternal Grandfather     > 44  . Stroke Maternal Grandmother     in 97s  . Aneurysm Paternal Grandmother     cns ; in 2s  . Diabetes Maternal Aunt   . Stomach cancer Maternal Aunt   . Breast cancer Maternal Aunt   . Heart disease Sister   . Coronary artery disease Maternal Grandfather     Social History Social History  Substance Use Topics  . Smoking status: Never Smoker  . Smokeless tobacco: Never Used  . Alcohol use Yes     Comment: Wine-VERY RARELY     Allergies   Chlorthalidone; Hydrochlorothiazide; Losartan potassium-hctz; Norvasc [amlodipine besylate]; Raloxifene; and Rofecoxib   Review of Systems Review of Systems  Respiratory: Positive for shortness of breath.   All other systems reviewed and are negative.    Physical Exam Updated Vital Signs BP (!) 131/50   Pulse (!) 40    Temp 98.2 F (36.8 C) (Oral)   Resp 16   Ht 5' (1.524 m)   Wt 183 lb (83 kg)   SpO2 96%   BMI 35.74 kg/m   Physical Exam  Constitutional: She is oriented to person, place, and time. She appears well-developed and well-nourished.  HENT:  Head: Normocephalic and atraumatic.  Cardiovascular: Regular rhythm.   No murmur heard. bradycardic  Pulmonary/Chest: Effort normal and breath sounds normal. No respiratory distress.  Abdominal: Soft. There is no tenderness. There is no rebound and no guarding.  Musculoskeletal: She exhibits no edema or tenderness.  Neurological: She is alert and oriented to person, place, and time.  Skin: Skin is warm and dry.  Psychiatric: She has a normal mood and affect. Her behavior is normal.  Nursing note and vitals reviewed.    ED Treatments / Results  Labs (all labs ordered are listed, but only abnormal results are displayed) Labs Reviewed  BASIC METABOLIC PANEL - Abnormal; Notable for the following:       Result Value   Glucose, Bld 112 (*)    Creatinine, Ser 1.80 (*)    GFR calc non Af Amer 26 (*)    GFR calc Af Amer 30 (*)    All other components within normal limits  CBC  I-STAT TROPOININ, ED    EKG  EKG Interpretation  Date/Time:  Tuesday July 17 2016 14:12:47 EDT Ventricular Rate:  47 PR Interval:    QRS Duration: 159 QT Interval:  577 QTC Calculation: 511 R Axis:   26 Text Interpretation:  Sinus bradycardia Borderline prolonged PR interval Right bundle branch block Baseline wander in lead(s) III aVF Confirmed by Hazle Coca [phone]) on 07/17/2016 2:17:34 PM       Radiology Dg Chest Patrick 1 View  Result Date: 07/17/2016 CLINICAL DATA:  Three-day history of shortness of breath with exertion. Current history of hypertension. EXAM: PORTABLE CHEST 1 VIEW COMPARISON:  10/10/2015, 10/19/2013. FINDINGS: Cardiac silhouette mildly enlarged, unchanged. Thoracic aorta mildly tortuous and atherosclerotic, unchanged. Hilar and mediastinal  contours otherwise unremarkable. Lungs clear. Bronchovascular markings normal. Pulmonary vascularity normal. No visible pleural effusions. No pneumothorax. IMPRESSION: 1.  No acute cardiopulmonary disease. 2. Stable mild cardiomegaly without pulmonary edema. 3. Mild thoracic aortic atherosclerosis. Electronically Signed   By: Evangeline Dakin M.D.   On: 07/17/2016 14:56    Procedures Procedures (including critical care time)  Medications Ordered in ED Medications - No data to display   Initial Impression / Assessment and Plan / ED Course  I have reviewed the triage vital signs and the nursing notes.  Pertinent labs & imaging results that were available during my care of the patient were reviewed by me and considered in my medical decision making (see chart for details).  Clinical Course    Patient here for evaluation of progressive shortness of breath and dyspnea on exertion. She is bradycardic in the emergency Department but in no acute distress. Initial EKG with sinus bradycardia. Cardiology consulted for symptomatic bradycardia. She was evaluated by cardiology with plan to admit for further monitoring.  Final Clinical Impressions(s) / ED Diagnoses   Final diagnoses:  SOB (shortness of breath)    New Prescriptions New Prescriptions   No medications on file     Quintella Reichert, MD 07/18/16 0210

## 2016-07-17 NOTE — Telephone Encounter (Signed)
Patient Name: Kylie Patrick  DOB: 1936-04-28    Initial Comment Caller states she was dx syncopy, getting short of breath.   Nurse Assessment  Nurse: Leilani Merl, RN, Heather Date/Time (Eastern Time): 07/17/2016 1:02:57 PM  Confirm and document reason for call. If symptomatic, describe symptoms. You must click the next button to save text entered. ---Caller states that she started with SOB with exertion on Saturday and it seems to be getting worse.  Has the patient traveled out of the country within the last 30 days? ---Not Applicable  Does the patient have any new or worsening symptoms? ---Yes  Will a triage be completed? ---Yes  Related visit to physician within the last 2 weeks? ---No  Does the PT have any chronic conditions? (i.e. diabetes, asthma, etc.) ---Yes  List chronic conditions. ---See MR  Is this a behavioral health or substance abuse call? ---No     Guidelines    Guideline Title Affirmed Question Affirmed Notes  Breathing Difficulty [1] MILD difficulty breathing (e.g., minimal/no SOB at rest, SOB with walking, pulse <100) AND [2] NEW-onset or WORSE than normal    Final Disposition User   See Physician within 4 Hours (or PCP triage) Leilani Merl, RN, Heather    Referrals  [facility] - ED   Disagree/Comply: Comply

## 2016-07-18 ENCOUNTER — Encounter (HOSPITAL_COMMUNITY)
Admission: EM | Disposition: A | Discharge status: Home / Self Care | Payer: Self-pay | Source: Home / Self Care | Attending: Cardiovascular Disease

## 2016-07-18 DIAGNOSIS — I441 Atrioventricular block, second degree: Secondary | ICD-10-CM

## 2016-07-18 DIAGNOSIS — E876 Hypokalemia: Secondary | ICD-10-CM

## 2016-07-18 HISTORY — PX: EP IMPLANTABLE DEVICE: SHX172B

## 2016-07-18 LAB — CBC
HCT: 37.9 % (ref 36.0–46.0)
Hemoglobin: 11.8 g/dL — ABNORMAL LOW (ref 12.0–15.0)
MCH: 28 pg (ref 26.0–34.0)
MCHC: 31.1 g/dL (ref 30.0–36.0)
MCV: 90 fL (ref 78.0–100.0)
PLATELETS: 192 10*3/uL (ref 150–400)
RBC: 4.21 MIL/uL (ref 3.87–5.11)
RDW: 14.5 % (ref 11.5–15.5)
WBC: 6.6 10*3/uL (ref 4.0–10.5)

## 2016-07-18 LAB — BASIC METABOLIC PANEL
Anion gap: 14 (ref 5–15)
BUN: 17 mg/dL (ref 6–20)
CO2: 24 mmol/L (ref 22–32)
CREATININE: 1.68 mg/dL — AB (ref 0.44–1.00)
Calcium: 8.5 mg/dL — ABNORMAL LOW (ref 8.9–10.3)
Chloride: 103 mmol/L (ref 101–111)
GFR calc Af Amer: 33 mL/min — ABNORMAL LOW (ref >60)
GFR, EST NON AFRICAN AMERICAN: 28 mL/min — AB (ref >60)
GLUCOSE: 94 mg/dL (ref 65–99)
Potassium: 3.3 mmol/L — ABNORMAL LOW (ref 3.5–5.1)
SODIUM: 141 mmol/L (ref 135–145)

## 2016-07-18 LAB — TSH: TSH: 2.245 u[IU]/mL (ref 0.350–4.500)

## 2016-07-18 LAB — SURGICAL PCR SCREEN
MRSA, PCR: NEGATIVE
Staphylococcus aureus: NEGATIVE

## 2016-07-18 LAB — MRSA PCR SCREENING: MRSA BY PCR: NEGATIVE

## 2016-07-18 SURGERY — PACEMAKER IMPLANT
Anesthesia: LOCAL

## 2016-07-18 MED ORDER — POTASSIUM CHLORIDE CRYS ER 20 MEQ PO TBCR
40.0000 meq | EXTENDED_RELEASE_TABLET | Freq: Once | ORAL | Status: AC
  Administered 2016-07-18: 40 meq via ORAL
  Filled 2016-07-18: qty 2

## 2016-07-18 MED ORDER — CEFAZOLIN SODIUM-DEXTROSE 2-4 GM/100ML-% IV SOLN
INTRAVENOUS | Status: AC
  Filled 2016-07-18: qty 100

## 2016-07-18 MED ORDER — SODIUM CHLORIDE 0.9 % IR SOLN
Status: AC
  Filled 2016-07-18: qty 2

## 2016-07-18 MED ORDER — MIDAZOLAM HCL 5 MG/5ML IJ SOLN
INTRAMUSCULAR | Status: AC
  Filled 2016-07-18: qty 5

## 2016-07-18 MED ORDER — CEFAZOLIN SODIUM-DEXTROSE 2-4 GM/100ML-% IV SOLN
2.0000 g | INTRAVENOUS | Status: AC
  Administered 2016-07-18: 2 g via INTRAVENOUS

## 2016-07-18 MED ORDER — CHLORHEXIDINE GLUCONATE 4 % EX LIQD
60.0000 mL | Freq: Once | CUTANEOUS | Status: AC
  Administered 2016-07-18: 4 via TOPICAL
  Filled 2016-07-18: qty 60

## 2016-07-18 MED ORDER — FENTANYL CITRATE (PF) 100 MCG/2ML IJ SOLN
INTRAMUSCULAR | Status: AC
  Filled 2016-07-18: qty 2

## 2016-07-18 MED ORDER — ACETAMINOPHEN 325 MG PO TABS
325.0000 mg | ORAL_TABLET | ORAL | Status: DC | PRN
Start: 2016-07-18 — End: 2016-07-19
  Administered 2016-07-18 – 2016-07-19 (×3): 650 mg via ORAL
  Filled 2016-07-18 (×2): qty 2
  Filled 2016-07-18: qty 1

## 2016-07-18 MED ORDER — POTASSIUM CHLORIDE CRYS ER 20 MEQ PO TBCR: 20.0000 meq | EXTENDED_RELEASE_TABLET | ORAL | Status: DC

## 2016-07-18 MED ORDER — SODIUM CHLORIDE 0.9 % IV SOLN
INTRAVENOUS | Status: AC
  Administered 2016-07-18: 18:00:00 via INTRAVENOUS

## 2016-07-18 MED ORDER — SODIUM CHLORIDE 0.9 % IR SOLN
80.0000 mg | Status: AC
  Administered 2016-07-18: 80 mg

## 2016-07-18 MED ORDER — MIDAZOLAM HCL 5 MG/5ML IJ SOLN
INTRAMUSCULAR | Status: DC | PRN
  Administered 2016-07-18 (×2): 1 mg via INTRAVENOUS

## 2016-07-18 MED ORDER — SODIUM CHLORIDE 0.9 % IV SOLN
INTRAVENOUS | Status: DC
  Administered 2016-07-18: 11:00:00 via INTRAVENOUS

## 2016-07-18 MED ORDER — LABETALOL HCL 5 MG/ML IV SOLN
INTRAVENOUS | Status: DC | PRN
  Administered 2016-07-18: 20 mg via INTRAVENOUS

## 2016-07-18 MED ORDER — LIDOCAINE HCL (PF) 1 % IJ SOLN
INTRAMUSCULAR | Status: DC | PRN
  Administered 2016-07-18: 38 mL

## 2016-07-18 MED ORDER — HEPARIN (PORCINE) IN NACL 2-0.9 UNIT/ML-% IJ SOLN
INTRAMUSCULAR | Status: AC
  Filled 2016-07-18: qty 500

## 2016-07-18 MED ORDER — LIDOCAINE HCL (PF) 1 % IJ SOLN
INTRAMUSCULAR | Status: AC
  Filled 2016-07-18: qty 30

## 2016-07-18 MED ORDER — ONDANSETRON HCL 4 MG/2ML IJ SOLN: 4.0000 mg | Freq: Four times a day (QID) | INTRAMUSCULAR | Status: DC | PRN

## 2016-07-18 MED ORDER — LABETALOL HCL 5 MG/ML IV SOLN
INTRAVENOUS | Status: AC
  Filled 2016-07-18: qty 4

## 2016-07-18 MED ORDER — HEPARIN (PORCINE) IN NACL 2-0.9 UNIT/ML-% IJ SOLN
INTRAMUSCULAR | Status: DC | PRN
  Administered 2016-07-18: 16:00:00

## 2016-07-18 MED ORDER — FENTANYL CITRATE (PF) 100 MCG/2ML IJ SOLN
INTRAMUSCULAR | Status: DC | PRN
  Administered 2016-07-18 (×2): 25 ug via INTRAVENOUS

## 2016-07-18 SURGICAL SUPPLY — 11 items
CABLE SURGICAL S-101-97-12 (CABLE) ×2 IMPLANT
CATH RIGHTSITE C315HIS02 (CATHETERS) ×2 IMPLANT
LEAD CAPSURE NOVUS 5076-52CM (Lead) ×2 IMPLANT
LEAD SELECT SECURE 3830 383069 (Lead) ×1 IMPLANT
PAD DEFIB LIFELINK (PAD) ×2 IMPLANT
PPM ADVISA MRI DR A2DR01 (Pacemaker) ×2 IMPLANT
SELECT SECURE 3830 383069 (Lead) ×2 IMPLANT
SHEATH CLASSIC 7F (SHEATH) ×4 IMPLANT
SLITTER 6232ADJ (MISCELLANEOUS) ×2 IMPLANT
TRAY PACEMAKER INSERTION (PACKS) ×2 IMPLANT
WIRE HI TORQ VERSACORE-J 145CM (WIRE) ×2 IMPLANT

## 2016-07-18 NOTE — Progress Notes (Signed)
Pt wanted to speak with Dr Caryl Comes before signing consent at this time.

## 2016-07-18 NOTE — Progress Notes (Signed)
     SUBJECTIVE: She has no complaints. No chest pain or dyspnea.   Tele: 2:1 AV block, HR 35 bpm  BP (!) 136/50   Pulse (!) 36   Temp 97.7 F (36.5 C) (Oral)   Resp 20   Ht 5' (1.524 m)   Wt 183 lb 13.8 oz (83.4 kg)   SpO2 95%   BMI 35.91 kg/m   Intake/Output Summary (Last 24 hours) at 07/18/16 0751 Last data filed at 07/18/16 0200  Gross per 24 hour  Intake              240 ml  Output              475 ml  Net             -235 ml    PHYSICAL EXAM General: Well developed, well nourished, in no acute distress. Alert and oriented x 3.  Psych:  Good affect, responds appropriately Neck: No JVD. No masses noted.  Lungs: Clear bilaterally with no wheezes or rhonci noted.  Heart: Regular brady with no murmurs noted. Abdomen: Bowel sounds are present. Soft, non-tender.  Extremities: No lower extremity edema.   LABS: Basic Metabolic Panel:  Recent Labs  07/17/16 1420 07/18/16 0216  NA 143 141  K 3.7 3.3*  CL 108 103  CO2 27 24  GLUCOSE 112* 94  BUN 20 17  CREATININE 1.80* 1.68*  CALCIUM 9.4 8.5*   CBC:  Recent Labs  07/17/16 1420 07/18/16 0216  WBC 7.1 6.6  HGB 12.7 11.8*  HCT 39.0 37.9  MCV 86.9 90.0  PLT 227 192   Current Meds: . aspirin EC  81 mg Oral QHS  . enoxaparin (LOVENOX) injection  40 mg Subcutaneous Q24H  . ezetimibe-simvastatin  1 tablet Oral q1800  . sodium chloride flush  3 mL Intravenous Q12H     ASSESSMENT AND PLAN:  1. Symptomatic Bradycardia/2:1 AV block: Pt admitted 07/17/16 from [facility] ED with dizziness, dyspnea and weakness. EKG with 2:1 AV block, HR 37-40 bpm. She was hemodynamically stable at time of admission with good BP and asymptomatic while lying in bed. Coreg was held. Last dose yesterday at 8:30am. She did have 3 syncopal events 07/08/16.  -She is still in 2:1 AV block this am. Will ask EP team to see today. Will keep in stepdown until we can see if she will recover after complete Coreg washout or if she will need permanent  pacemaker.   2. HTN: BP stable. Coreg on hold.   3. HLD: continue Vytorin.    4. Hypokalemia: Will replace this am.   Lauree Chandler  9/6/20177:51 AM

## 2016-07-18 NOTE — Consult Note (Signed)
ELECTROPHYSIOLOGY CONSULT NOTE    Patient ID: Kylie Patrick MRN: [id number], DOB/AGE: 1936/05/17 80 y.o.  Admit date: 07/17/2016 Date of Consult: 07/18/2016  Primary Physician: Cathlean Cower, MD Primary Cardiologist: Danella Sensing MD: Angelena Form  Reason for Consultation: 2:1 heart block   HPI:  Kylie Patrick is a 80 y.o. female with a past medical history significant for hypertension, hyperlipidemia, and GERD. She had a syncopal spell 07/17/16 that occurred while she was riding in a car.  She felt badly for several seconds and reached out to touch her husbands arm and lost consciousness.  She was discharged from the ER and seen by her PCP in follow up who recommended an event monitor but this has not been placed yet.  She has had worsening shortness of breath for the last 3 days and presented to the ER yesterday for evaluation.  She was found to be in 2:1 heart block and transferred to [facility] for further evaluation.  She is on Coreg at home and took 6.25mg  yesterday morning.  She is on no other AVN blocking agents.    Echo 10/2015 demonstrated EF 55-60%, no RWMA, grade 1 diastolic dysfunction.   She currently denies chest pain, shortness of breath at rest, LE edema, recent fevers, chills, nausea or vomiting. She has not had dizziness since admission but hasn't been out of bed.   Past Medical History:  Diagnosis Date  . Anxiety   . Colon polyp 2003  . Complication of anesthesia    slow to wake up one time  . Gastroenteritis    w/ renal insufficiency in context of protracted nausea & vomitting 2006  . GERD (gastroesophageal reflux disease)   . H/O hiatal hernia   . Headache(784.0)    occasional  . Hyperlipidemia   . Hypertension    w/ LVH on ECHO  . Tonsillitis 2006     Surgical History:  Past Surgical History:  Procedure Laterality Date  . ABDOMINAL HYSTERECTOMY  1976   BSO for endometriosis and bengin tumor  . APPENDECTOMY  1970  . BREAST LUMPECTOMY Left 70's   X 2  .  BUNIONECTOMY Bilateral   . CATARACT EXTRACTION W/PHACO Left 05/14/2013   Procedure: CATARACT EXTRACTION PHACO AND INTRAOCULAR LENS PLACEMENT (IOC);  Surgeon: Tonny Branch, MD;  Location: [facility];  Service: Ophthalmology;  Laterality: Left;  CDE:9.76  . CATARACT EXTRACTION W/PHACO Right 06/08/2013   Procedure: CATARACT EXTRACTION PHACO AND INTRAOCULAR LENS PLACEMENT (IOC);  Surgeon: Tonny Branch, MD;  Location: [facility];  Service: Ophthalmology;  Laterality: Right;  CDE 13.65  . CHOLECYSTECTOMY  1970  . COLONOSCOPY W/ POLYPECTOMY  2003   Dr [name]  . ESOPHAGEAL MANOMETRY N/A 10/05/2013   Procedure: ESOPHAGEAL MANOMETRY (EM);  Surgeon: Gatha Mayer, MD;  Location: [facility];  Service: Endoscopy;  Laterality: N/A;  . ESOPHAGOGASTRODUODENOSCOPY  02/05/13   Large Hiatal Hernia  . HERNIA REPAIR    . LAPAROSCOPIC NISSEN FUNDOPLICATION Bilateral [id number]   Procedure: LAPAROSCOPIC NISSEN FUNDOPLICATION with hiatal hernia repair ;  Surgeon: Edward Jolly, MD;  Location: [facility];  Service: General;  Laterality: Bilateral;     Prescriptions Prior to Admission  Medication Sig Dispense Refill Last Dose  . aspirin 81 MG tablet Take 81 mg by mouth at bedtime.    07/16/2016 at Unknown time  . Calcium in Bone Mineral Cmplx 350 MG MISC Take 3 each by mouth at bedtime. Takes 1 in the morning and 2 at night   07/16/2016 at Unknown  time  . carvedilol (COREG) 25 MG tablet TAKE 1/2 TABLET BY MOUTH TWICE A DAY (Patient taking differently: TAKE 12.5mg  TABLET BY MOUTH TWICE A DAY) 180 tablet 3 07/17/2016 at 0830  . Cholecalciferol (VITAMIN D3) 2000 UNITS TABS Take 1 tablet by mouth daily with lunch.    07/16/2016 at Unknown time  . Cyanocobalamin (VITAMIN B 12 PO) Take 1 tablet by mouth daily with lunch.   07/16/2016 at Unknown time  . folic acid (FOLVITE) A999333 MCG tablet Take 400 mcg by mouth every morning.   07/17/2016 at Unknown time  . furosemide (LASIX) 40 MG tablet TAKE 1 TABLET (40 MG TOTAL) BY MOUTH EVERY MORNING. 90  tablet 1 07/17/2016 at Unknown time  . gabapentin (NEURONTIN) 100 MG capsule Take 1 capsule (100 mg total) by mouth every 8 (eight) hours as needed (pain). 30 capsule 2 unknown  . Magnesium 400 MG CAPS Take 400 mg by mouth at bedtime.    07/16/2016 at Unknown time  . Omega-3 Fatty Acids (FISH OIL) 500 MG CAPS Take 500 mg by mouth daily with lunch.    07/16/2016 at Unknown time  . omeprazole-sodium bicarbonate (ZEGERID) 40-1100 MG capsule TAKE ONE CAPSULE BY MOUTH TWICE A DAY 180 capsule 1 07/17/2016 at Unknown time  . valsartan (DIOVAN) 320 MG tablet TAKE 1 TABLET EVERY DAY (Patient taking differently: TAKE 320mg  TABLET BY MOUTH ONCE DAILY) 90 tablet 3 07/17/2016 at Unknown time  . VYTORIN 10-20 MG tablet TAKE 1 TABLET BY MOUTH AT BEDTIME. --- PLEASE ESTABLISH WITH NEW PCP FOR FURTHER REFILLS. 90 tablet 2 07/16/2016 at Unknown time    Inpatient Medications:  . aspirin EC  81 mg Oral QHS  . ezetimibe-simvastatin  1 tablet Oral q1800  . potassium chloride  40 mEq Oral Once  . sodium chloride flush  3 mL Intravenous Q12H    Allergies:  Allergies  Allergen Reactions  . Chlorthalidone     01/09/13 creatinine 2.2  . Hydrochlorothiazide     Hypokalemia  . Losartan Potassium-Hctz     Hypokalemia  . Norvasc [Amlodipine Besylate]     Edema  . Raloxifene     HTN  . Rofecoxib     HTN    Social History   Social History  . Marital status: Married    Spouse name: N/A  . Number of children: 2  . Years of education: N/A   Occupational History  . Retired Retired   Social History Main Topics  . Smoking status: Never Smoker  . Smokeless tobacco: Never Used  . Alcohol use Yes     Comment: Wine-VERY RARELY  . Drug use: No  . Sexual activity: Not Currently   Other Topics Concern  . Not on file   Social History Narrative  . No narrative on file     Family History  Problem Relation Age of Onset  . Heart attack Father 65    fatal MI @ 21  . Coronary artery disease Mother     CABG; MI @ 2   . Diabetes Mother   . Stroke Paternal Grandfather     > 60  . Stroke Maternal Grandmother     in 31s  . Aneurysm Paternal Grandmother     cns ; in 71s  . Diabetes Maternal Aunt   . Stomach cancer Maternal Aunt   . Breast cancer Maternal Aunt   . Heart disease Sister   . Coronary artery disease Maternal Grandfather      Review of  Systems: All other systems reviewed and are otherwise negative except as noted above.  Physical Exam: Vitals:   07/18/16 0200 07/18/16 0400 07/18/16 0600 07/18/16 0826  BP: (!) 147/61 (!) 142/44 (!) 136/50 (!) 152/57  Pulse: (!) 37 (!) 42 (!) 36 (!) 41  Resp: 17 (!) 21 20 (!) 24  Temp:  97.7 F (36.5 C)  97.8 F (36.6 C)  TempSrc:  Oral  Oral  SpO2: 98% 96% 95% 97%  Weight:      Height:        GEN- The patient is elderly appearing, alert and oriented x 3 today.   HEENT: normocephalic, atraumatic; sclera clear, conjunctiva pink; hearing intact; oropharynx clear; neck supple  Lungs- Clear to ausculation bilaterally, normal work of breathing.  No wheezes, rales, rhonchi Heart- Bradycardic regular rate and rhythm, no murmurs, rubs or gallops GI- soft, non-tender, non-distended, bowel sounds present  Extremities- no clubbing, cyanosis, or edema; DP/PT/radial pulses 2+ bilaterally MS- no significant deformity or atrophy Skin- warm and dry, no rash or lesion Psych- euthymic mood, full affect Neuro- strength and sensation are intact  Labs:   Lab Results  Component Value Date   WBC 6.6 07/18/2016   HGB 11.8 (L) 07/18/2016   HCT 37.9 07/18/2016   MCV 90.0 07/18/2016   PLT 192 07/18/2016    Recent Labs Lab 07/18/16 0216  NA 141  K 3.3*  CL 103  CO2 24  BUN 17  CREATININE 1.68*  CALCIUM 8.5*  GLUCOSE 94      Radiology/Studies: Dg Chest Port 1 View Result Date: 07/17/2016 CLINICAL DATA:  Three-day history of shortness of breath with exertion. Current history of hypertension. EXAM: PORTABLE CHEST 1 VIEW COMPARISON:  10/10/2015,  10/19/2013. FINDINGS: Cardiac silhouette mildly enlarged, unchanged. Thoracic aorta mildly tortuous and atherosclerotic, unchanged. Hilar and mediastinal contours otherwise unremarkable. Lungs clear. Bronchovascular markings normal. Pulmonary vascularity normal. No visible pleural effusions. No pneumothorax. IMPRESSION: 1.  No acute cardiopulmonary disease. 2. Stable mild cardiomegaly without pulmonary edema. 3. Mild thoracic aortic atherosclerosis. Electronically Signed   By: Evangeline Dakin M.D.   On: 07/17/2016 14:56    EKG:2:1 AV block, ventricular rate 41  TELEMETRY: 2:1 heart block   Assessment/Plan: 1.  Symptomatic 2:1 heart block The patient has symptomatic 2:1 heart block. She is on Coreg and 5 half lives will be washed out around lunch today. If no improvement in conduction this afternoon, will plan pacemaker implant. Risks, benefits of procedure discussed with patient and family who agree to proceed.  2.  HTN BP slightly elevated today Will resume Coreg post PPM implant   Dr Caryl Comes to see later today   Signed, Chanetta Marshall, NP 07/18/2016 8:46 AM  Hypokalemia  repeleted   Reviewed risk and benefits  Of device implant for symptomatic bradycardia   Prev LV function

## 2016-07-19 ENCOUNTER — Encounter (HOSPITAL_COMMUNITY): Payer: Self-pay | Admitting: Internal Medicine

## 2016-07-19 ENCOUNTER — Inpatient Hospital Stay (HOSPITAL_COMMUNITY): Payer: Medicare Other

## 2016-07-19 DIAGNOSIS — I10 Essential (primary) hypertension: Secondary | ICD-10-CM

## 2016-07-19 NOTE — Op Note (Signed)
NAMELEMPI, FALSETTI               ACCOUNT NO.:  [id number]  MEDICAL RECORD NO.:  [id number]  LOCATION:  2H27C                        FACILITY:  [facility]  PHYSICIAN:  Deboraha Sprang, MD, FACCDATE OF BIRTH:  Apr 08, 1936  DATE OF PROCEDURE: DATE OF DISCHARGE:  07/19/2016                              OPERATIVE REPORT   PREOPERATIVE DIAGNOSIS:  2:1 block-symptomatic.  POSTOPERATIVE DIAGNOSIS:  2:1 block-symptomatic.  PROCEDURE:  Dual-chamber His bundle pacing.  DESCRIPTION OF PROCEDURE:  Following obtaining informed consent, the patient was brought to electrophysiology laboratory and placed on the fluoroscopic table in supine position.  After routine prep and drape of the left upper chest, lidocaine was infiltrated in prepectoral subclavicular region.  An incision was made and carried down to layer of the prepectoral fascia using sharp dissection and electrocautery.  A pocket was formed similarly.  Hemostasis was obtained.  Thereafter, attention was turned to gain access to the extrathoracic left subclavian vein, which was accomplished without difficulty without the aspiration or puncture of the artery.  Two separate venipunctures were accomplished.  Guidewires were placed and retained and sequentially 7-French sheaths were placed, which were passed a Medtronic 5076, 52 cm active fixation atrial lead serial [id number] which was passed as a temporary pacemaker into the right ventricular apex.  It was subsequently moved to the right atrial appendage where the bipolar P- wave was 3 with a pace impedance of 690 ohms.  The threshold was 1 V at 0.5 milliseconds.  Current threshold was 1.3 mA.  There was no diaphragmatic pacing at 10 V and the current of injury was brisk.  While the lead was placed in the ventricle, however, we then undertook mapping of the His bundle position.  The Medtronic 3830 lead, serial [id number] was used to map with the sheath at the His bundle site.  We found a  site where we had the His bundle potential.  We in fact were able to demonstrate to 2:1 infra-Hisian heart block.  In this location following deployment, the R-wave was 4 mV with a pace impedance of 407, threshold 0.8 V at 1 millisecond.  The sheath was removed and both parameters were then recorded.  We then moved the atrial lead as noted above.  The pocket was copiously irrigated with antibiotic containing saline solution.  Hemostasis having been assured and the leads having been secured to the prepectoral fascia.  The leads were then attached to a Medtronic Advisa pulse generator serial [id number].  P synchronous pacing was identified.  The wound was closed in 2 layers in normal fashion.  The wound was washed, dried, and Dermabond dressing was applied.  Needle counts, sponge counts, and instrument counts were correct at the end of the procedure according to staff.  The patient tolerated the procedure without apparent complication.  Estimated blood loss was less than 50 mL.     Deboraha Sprang, MD, [place]     SCK/MEDQ  D:  07/18/2016  T:  07/19/2016  Job:  [id number]

## 2016-07-19 NOTE — Progress Notes (Signed)
Patient discharged to home with husband, volunteer has transported patient. All personal belongings have been accounted for. All instructions have been reviewed and there are no questions at this time. Follow up appointments have been reviewed.

## 2016-07-19 NOTE — Discharge Instructions (Signed)
° ° °  Supplemental Discharge Instructions for  Pacemaker/Defibrillator Patients  Activity No heavy lifting or vigorous activity with your left/right arm for 6 to 8 weeks.  Do not raise your left/right arm above your head for one week.  Gradually raise your affected arm as drawn below.           __        07/23/16                        07/24/16                          07/25/16               07/26/16  NO DRIVING for    1 week ; you may begin driving on   [id number]  .  WOUND CARE - Keep the wound area clean and dry.   - The tape/steri-strips on your wound will fall off; do not pull them off.  No bandage is needed on the site.  DO  NOT apply any creams, oils, or ointments to the wound area. - If you notice any drainage or discharge from the wound, any swelling or bruising at the site, or you develop a fever > 101? F after you are discharged home, call the office at once.  Special Instructions - You are still able to use cellular telephones; use the ear opposite the side where you have your pacemaker/defibrillator.  Avoid carrying your cellular phone near your device. - When traveling through airports, show security personnel your identification card to avoid being screened in the metal detectors.  Ask the security personnel to use the hand wand. - Avoid arc welding equipment, TENS units (transcutaneous nerve stimulators).  Call the office for questions about other devices. - Avoid electrical appliances that are in poor condition or are not properly grounded. - Microwave ovens are safe to be near or to operate.

## 2016-07-19 NOTE — Discharge Summary (Signed)
ELECTROPHYSIOLOGY PROCEDURE DISCHARGE SUMMARY    Patient ID: Kylie Patrick,  MRN: [id number], DOB/AGE: [birth year] 80 y.o.  Admit date: 07/17/2016 Discharge date: 07/19/2016  Primary Care Physician: Cathlean Cower, MD Primary Cardiologist: Harrington Challenger Electrophysiologist: Caryl Comes  Primary Discharge Diagnosis:  Symptomatic high grade heart block status post pacemaker implantation this admission  Secondary Discharge Diagnosis:  1.  Hypertension 2.  Hyperlipidemia 3.  GERD  Allergies  Allergen Reactions  . Chlorthalidone     01/09/13 creatinine 2.2  . Hydrochlorothiazide     Hypokalemia  . Losartan Potassium-Hctz     Hypokalemia  . Norvasc [Amlodipine Besylate]     Edema  . Raloxifene     HTN  . Rofecoxib     HTN     Procedures This Admission:  1.  Implantation of a MDT dual chamber PPM on 07/18/16 by Dr Caryl Comes.  The patient received a MDT model number Advisa PPM with model number [id number] right atrial lead and [id number] right ventricular lead. There were no immediate post procedure complications. 2.  CXR on 07/19/16 demonstrated no pneumothorax status post device implantation.   Brief HPI/Hospital Course:  Kylie Patrick is a 80 y.o. female with a past medical history as outlined above. She presented to the hospital with symptomatic 2:1 heart block. She had exercise intolerance and recent syncope. Her Coreg was allowed to wash out without improvement in conduction.  Risks, benefits, and alternatives to PPM implantation were reviewed with the patient who wished to proceed.  The patient underwent implantation of a MDT dual chamber pacemaker with details as outlined above.  She  was monitored on telemetry overnight which demonstrated sinus rhythm with V pacing.  Left chest was without hematoma.  The device was interrogated and found to be functioning normally.  CXR was obtained and demonstrated no pneumothorax status post device implantation.  Wound care, arm mobility, and restrictions were reviewed  with the patient.  The patient was examined and considered stable for discharge to home.    Physical Exam: Vitals:   07/18/16 2000 07/18/16 2359 07/19/16 0000 07/19/16 0438  BP:  (!) 134/52 (!) 134/52 (!) 152/72  Pulse: 82 81 79 65  Resp:  20 (!) 21 19  Temp:  97.6 F (36.4 C)  97.5 F (36.4 C)  TempSrc:  Oral  Oral  SpO2: 96% 95% 97% 98%  Weight:      Height:        GEN- The patient is elderly appearing, alert and oriented x 3 today.   HEENT: normocephalic, atraumatic; sclera clear, conjunctiva pink; hearing intact; oropharynx clear; neck supple  Lungs- Clear to ausculation bilaterally, normal work of breathing.  No wheezes, rales, rhonchi Heart- Regular rate and rhythm, no murmurs, rubs or gallops  GI- soft, non-tender, non-distended, bowel sounds present  Extremities- no clubbing, cyanosis, or edema  MS- no significant deformity or atrophy Skin- warm and dry, no rash or lesion, left chest without hematoma, +ecchymosis Psych- euthymic mood, full affect Neuro- strength and sensation are intact   Labs:   Lab Results  Component Value Date   WBC 6.6 07/18/2016   HGB 11.8 (L) 07/18/2016   HCT 37.9 07/18/2016   MCV 90.0 07/18/2016   PLT 192 07/18/2016     Recent Labs Lab 07/18/16 0216  NA 141  K 3.3*  CL 103  CO2 24  BUN 17  CREATININE 1.68*  CALCIUM 8.5*  GLUCOSE 94    Discharge Medications:    Medication List  TAKE these medications   aspirin 81 MG tablet Take 81 mg by mouth at bedtime.   Calcium in Bone Mineral Cmplx 350 MG Misc Take 3 each by mouth at bedtime. Takes 1 in the morning and 2 at night   carvedilol 25 MG tablet Commonly known as:  COREG TAKE 1/2 TABLET BY MOUTH TWICE A DAY What changed:  See the new instructions.   Fish Oil 500 MG Caps Take 500 mg by mouth daily with lunch.   folic acid A999333 MCG tablet Commonly known as:  FOLVITE Take 400 mcg by mouth every morning.   furosemide 40 MG tablet Commonly known as:  LASIX TAKE 1  TABLET (40 MG TOTAL) BY MOUTH EVERY MORNING.   gabapentin 100 MG capsule Commonly known as:  NEURONTIN Take 1 capsule (100 mg total) by mouth every 8 (eight) hours as needed (pain).   Magnesium 400 MG Caps Take 400 mg by mouth at bedtime.   omeprazole-sodium bicarbonate 40-1100 MG capsule Commonly known as:  ZEGERID TAKE ONE CAPSULE BY MOUTH TWICE A DAY   valsartan 320 MG tablet Commonly known as:  DIOVAN TAKE 1 TABLET EVERY DAY What changed:  See the new instructions.   VITAMIN B 12 PO Take 1 tablet by mouth daily with lunch.   Vitamin D3 2000 units Tabs Take 1 tablet by mouth daily with lunch.   VYTORIN 10-20 MG tablet Generic drug:  ezetimibe-simvastatin TAKE 1 TABLET BY MOUTH AT BEDTIME. --- PLEASE ESTABLISH WITH NEW PCP FOR FURTHER REFILLS.       Disposition:  Discharge Instructions    Diet - low sodium heart healthy    Complete by:  As directed   Increase activity slowly    Complete by:  As directed     Follow-up Information    [facility] Follow up on 07/26/2016.   Specialty:  Cardiology Why:  at 11;30am for wound check  Contact information: [address] [place]       Virl Axe, MD Follow up on 10/18/2016.   Specialty:  Cardiology Why:  at 1:45PM Contact information: [address] [address] [phone]           Duration of Discharge Encounter: Greater than 30 minutes including physician time.  Signed, Chanetta Marshall, NP 07/19/2016 7:27 AM  Results and CXR reviewed Device function normal Instructions given Discharge today  remeasure K prior to discharge

## 2016-07-20 ENCOUNTER — Other Ambulatory Visit: Payer: Self-pay | Admitting: Internal Medicine

## 2016-07-20 ENCOUNTER — Telehealth: Payer: Self-pay | Admitting: *Deleted

## 2016-07-20 NOTE — Telephone Encounter (Signed)
Pt was on TCM list admitted for Symptomatic high grade heart block  Which had to have pacemaker implantation on 07/18/16 by Dr Caryl Comes.  The patient received a MDT model number Advisa PPM with model number [id number] right atrial lead and [id number] right ventricular lead. There were no immediate post procedure complications. The  CXR on 07/19/16 demonstrated no pneumothorax status post device implantation. Pt will f/u w/cardilogy on 9/14...Kylie Patrick

## 2016-07-25 ENCOUNTER — Other Ambulatory Visit: Payer: Medicare Other

## 2016-07-26 ENCOUNTER — Ambulatory Visit (INDEPENDENT_AMBULATORY_CARE_PROVIDER_SITE_OTHER): Payer: Medicare Other | Admitting: *Deleted

## 2016-07-26 DIAGNOSIS — R001 Bradycardia, unspecified: Secondary | ICD-10-CM | POA: Diagnosis not present

## 2016-07-26 LAB — CUP PACEART INCLINIC DEVICE CHECK
Brady Statistic AP VS Percent: 0.02 %
Brady Statistic AS VP Percent: 76.23 %
Brady Statistic AS VS Percent: 0.01 %
Implantable Lead Implant Date: 20170906
Implantable Lead Implant Date: 20170906
Implantable Lead Location: 753860
Implantable Lead Model: 3830
Lead Channel Impedance Value: 304 Ohm
Lead Channel Impedance Value: 437 Ohm
Lead Channel Impedance Value: 513 Ohm
Lead Channel Pacing Threshold Amplitude: 0.5 V
Lead Channel Pacing Threshold Amplitude: 1.25 V
Lead Channel Pacing Threshold Pulse Width: 0.4 ms
Lead Channel Sensing Intrinsic Amplitude: 4.5 mV
Lead Channel Sensing Intrinsic Amplitude: 4.875 mV
Lead Channel Setting Pacing Amplitude: 2 V
Lead Channel Setting Pacing Amplitude: 3.5 V
Lead Channel Setting Pacing Pulse Width: 1 ms
MDC IDC LEAD LOCATION: 753859
MDC IDC MSMT BATTERY VOLTAGE: 3.07 V
MDC IDC MSMT LEADCHNL RA PACING THRESHOLD PULSEWIDTH: 0.4 ms
MDC IDC MSMT LEADCHNL RA SENSING INTR AMPL: 3.5 mV
MDC IDC MSMT LEADCHNL RA SENSING INTR AMPL: 4 mV
MDC IDC MSMT LEADCHNL RV IMPEDANCE VALUE: 475 Ohm
MDC IDC SESS DTM: 20170914154432
MDC IDC SET LEADCHNL RV SENSING SENSITIVITY: 0.9 mV
MDC IDC STAT BRADY AP VP PERCENT: 23.74 %
MDC IDC STAT BRADY RA PERCENT PACED: 23.76 %
MDC IDC STAT BRADY RV PERCENT PACED: 99.97 %

## 2016-07-26 NOTE — Progress Notes (Signed)
Wound check appointment. Dermabond removed. Wound without redness or edema. Incision edges approximated, wound well healed. Normal device function. Thresholds, sensing, and impedances consistent with implant measurements. Device programmed at 3.5V(RA) and 2.0V@1 .34ms(His) for extra safety margin until 3 month visit. EKG was not performed this session per AS. Histogram distribution appropriate for patient and level of activity. No mode switches or high ventricular rates noted. Patient educated about wound care, arm mobility, lifting restrictions. ROV in 3 months with SK.

## 2016-08-01 ENCOUNTER — Encounter (HOSPITAL_COMMUNITY): Payer: Medicare Other

## 2016-08-09 ENCOUNTER — Ambulatory Visit: Payer: Medicare Other | Admitting: Internal Medicine

## 2016-09-03 ENCOUNTER — Other Ambulatory Visit: Payer: Self-pay | Admitting: Internal Medicine

## 2016-10-10 ENCOUNTER — Other Ambulatory Visit (INDEPENDENT_AMBULATORY_CARE_PROVIDER_SITE_OTHER): Payer: Medicare Other

## 2016-10-10 ENCOUNTER — Telehealth: Payer: Self-pay | Admitting: Internal Medicine

## 2016-10-10 ENCOUNTER — Encounter: Payer: Self-pay | Admitting: Internal Medicine

## 2016-10-10 ENCOUNTER — Ambulatory Visit (INDEPENDENT_AMBULATORY_CARE_PROVIDER_SITE_OTHER): Payer: Medicare Other | Admitting: Internal Medicine

## 2016-10-10 VITALS — BP 138/78 | HR 82 | Temp 97.9°F | Resp 20 | Wt 184.0 lb

## 2016-10-10 DIAGNOSIS — Z0001 Encounter for general adult medical examination with abnormal findings: Secondary | ICD-10-CM

## 2016-10-10 DIAGNOSIS — R739 Hyperglycemia, unspecified: Secondary | ICD-10-CM | POA: Diagnosis not present

## 2016-10-10 DIAGNOSIS — Z23 Encounter for immunization: Secondary | ICD-10-CM

## 2016-10-10 DIAGNOSIS — Z0000 Encounter for general adult medical examination without abnormal findings: Secondary | ICD-10-CM

## 2016-10-10 DIAGNOSIS — N182 Chronic kidney disease, stage 2 (mild): Secondary | ICD-10-CM | POA: Diagnosis not present

## 2016-10-10 DIAGNOSIS — Z299 Encounter for prophylactic measures, unspecified: Secondary | ICD-10-CM

## 2016-10-10 DIAGNOSIS — I10 Essential (primary) hypertension: Secondary | ICD-10-CM | POA: Diagnosis not present

## 2016-10-10 DIAGNOSIS — E782 Mixed hyperlipidemia: Secondary | ICD-10-CM

## 2016-10-10 LAB — URINALYSIS, ROUTINE W REFLEX MICROSCOPIC
Bilirubin Urine: NEGATIVE
Hgb urine dipstick: NEGATIVE
KETONES UR: NEGATIVE
NITRITE: NEGATIVE
PH: 7.5 (ref 5.0–8.0)
SPECIFIC GRAVITY, URINE: 1.01 (ref 1.000–1.030)
Total Protein, Urine: NEGATIVE
URINE GLUCOSE: NEGATIVE
Urobilinogen, UA: 0.2 (ref 0.0–1.0)

## 2016-10-10 LAB — LIPID PANEL
CHOL/HDL RATIO: 3
Cholesterol: 147 mg/dL (ref 0–200)
HDL: 51.8 mg/dL (ref >39.00)
LDL Cholesterol: 75 mg/dL (ref 0–99)
NONHDL: 95.19
TRIGLYCERIDES: 100 mg/dL (ref 0.0–149.0)
VLDL: 20 mg/dL (ref 0.0–40.0)

## 2016-10-10 LAB — HEPATIC FUNCTION PANEL
ALK PHOS: 59 U/L (ref 39–117)
ALT: 20 U/L (ref 0–35)
AST: 25 U/L (ref 0–37)
Albumin: 4.2 g/dL (ref 3.5–5.2)
BILIRUBIN DIRECT: 0.1 mg/dL (ref 0.0–0.3)
BILIRUBIN TOTAL: 0.7 mg/dL (ref 0.2–1.2)
Total Protein: 6.9 g/dL (ref 6.0–8.3)

## 2016-10-10 LAB — CBC WITH DIFFERENTIAL/PLATELET
BASOS PCT: 0.7 % (ref 0.0–3.0)
Basophils Absolute: 0 10*3/uL (ref 0.0–0.1)
EOS PCT: 3.5 % (ref 0.0–5.0)
Eosinophils Absolute: 0.2 10*3/uL (ref 0.0–0.7)
HCT: 39.8 % (ref 36.0–46.0)
Hemoglobin: 13.2 g/dL (ref 12.0–15.0)
LYMPHS ABS: 1.7 10*3/uL (ref 0.7–4.0)
Lymphocytes Relative: 28.6 % (ref 12.0–46.0)
MCHC: 33.2 g/dL (ref 30.0–36.0)
MCV: 85.4 fl (ref 78.0–100.0)
MONO ABS: 0.7 10*3/uL (ref 0.1–1.0)
MONOS PCT: 11.4 % (ref 3.0–12.0)
NEUTROS PCT: 55.8 % (ref 43.0–77.0)
Neutro Abs: 3.4 10*3/uL (ref 1.4–7.7)
Platelets: 219 10*3/uL (ref 150.0–400.0)
RBC: 4.65 Mil/uL (ref 3.87–5.11)
RDW: 15.4 % (ref 11.5–15.5)
WBC: 6 10*3/uL (ref 4.0–10.5)

## 2016-10-10 LAB — BASIC METABOLIC PANEL
BUN: 23 mg/dL (ref 6–23)
CHLORIDE: 104 meq/L (ref 96–112)
CO2: 32 mEq/L (ref 19–32)
Calcium: 9.7 mg/dL (ref 8.4–10.5)
Creatinine, Ser: 1.47 mg/dL — ABNORMAL HIGH (ref 0.40–1.20)
GFR: 36.5 mL/min — AB (ref >60.00)
Glucose, Bld: 102 mg/dL — ABNORMAL HIGH (ref 70–99)
POTASSIUM: 3.9 meq/L (ref 3.5–5.1)
SODIUM: 144 meq/L (ref 135–145)

## 2016-10-10 LAB — HEMOGLOBIN A1C: HEMOGLOBIN A1C: 5.8 % (ref 4.6–6.5)

## 2016-10-10 LAB — TSH: TSH: 1.47 u[IU]/mL (ref 0.35–4.50)

## 2016-10-10 MED ORDER — ESOMEPRAZOLE MAGNESIUM 40 MG PO CPDR: 40.0000 mg | DELAYED_RELEASE_CAPSULE | Freq: Two times a day (BID) | ORAL | 1 refills | Status: DC

## 2016-10-10 NOTE — Telephone Encounter (Signed)
Use esomeprazole 40 mg instead same sig etc

## 2016-10-10 NOTE — Telephone Encounter (Signed)
Called and informed Kylie Patrick that I would be sending in the esomeprazole for her per Dr [name].

## 2016-10-10 NOTE — Progress Notes (Signed)
Subjective:    Patient ID: Kylie Patrick, female    DOB: 11-28-1935, 80 y.o.   MRN: [id number]  HPI  Here for wellness and f/u;  Overall doing ok;  Pt denies Chest pain, worsening SOB, DOE, wheezing, orthopnea, PND, worsening LE edema, palpitations, dizziness or syncope.  Pt denies neurological change such as new headache, facial or extremity weakness.  Pt denies polydipsia, polyuria, or low sugar symptoms. Pt states overall good compliance with treatment and medications, good tolerability, and has been trying to follow appropriate diet.  Pt denies worsening depressive symptoms, suicidal ideation or panic. No fever, night sweats, wt loss, loss of appetite, or other constitutional symptoms.  Pt states good ability with ADL's, has low fall risk, home safety reviewed and adequate, no other significant changes in hearing or vision, and only occasionally active with exercise.  Did have some minor worsening CKD and K and Hgb with PPM placement sept 2017, TSH normal.  Has f/u appt dec 7.  Borderline low b12 has been tx with daily B complex MVI x 1 yr.  Also Has occ right side sciatica  No other new history changes Past Medical History:  Diagnosis Date  . Anxiety   . Colon polyp 2003  . Complication of anesthesia    slow to wake up one time  . Gastroenteritis    w/ renal insufficiency in context of protracted nausea & vomitting 2006  . GERD (gastroesophageal reflux disease)   . H/O hiatal hernia   . Headache(784.0)    occasional  . Hyperlipidemia   . Hypertension    w/ LVH on ECHO  . Tonsillitis 2006   Past Surgical History:  Procedure Laterality Date  . ABDOMINAL HYSTERECTOMY  1976   BSO for endometriosis and bengin tumor  . APPENDECTOMY  1970  . BREAST LUMPECTOMY Left 70's   X 2  . BUNIONECTOMY Bilateral   . CATARACT EXTRACTION W/PHACO Left 05/14/2013   Procedure: CATARACT EXTRACTION PHACO AND INTRAOCULAR LENS PLACEMENT (IOC);  Surgeon: Tonny Branch, MD;  Location: [facility];  Service:  Ophthalmology;  Laterality: Left;  CDE:9.76  . CATARACT EXTRACTION W/PHACO Right 06/08/2013   Procedure: CATARACT EXTRACTION PHACO AND INTRAOCULAR LENS PLACEMENT (IOC);  Surgeon: Tonny Branch, MD;  Location: [facility];  Service: Ophthalmology;  Laterality: Right;  CDE 13.65  . CHOLECYSTECTOMY  1970  . COLONOSCOPY W/ POLYPECTOMY  2003   Dr [name]  . EP IMPLANTABLE DEVICE N/A 07/18/2016   Procedure: Pacemaker Implant;  Surgeon: Deboraha Sprang, MD;  Location: [facility];  Service: Cardiovascular;  Laterality: N/A;  . ESOPHAGEAL MANOMETRY N/A 10/05/2013   Procedure: ESOPHAGEAL MANOMETRY (EM);  Surgeon: Gatha Mayer, MD;  Location: [facility];  Service: Endoscopy;  Laterality: N/A;  . ESOPHAGOGASTRODUODENOSCOPY  02/05/13   Large Hiatal Hernia  . HERNIA REPAIR    . LAPAROSCOPIC NISSEN FUNDOPLICATION Bilateral [id number]   Procedure: LAPAROSCOPIC NISSEN FUNDOPLICATION with hiatal hernia repair ;  Surgeon: Edward Jolly, MD;  Location: [facility];  Service: General;  Laterality: Bilateral;    reports that she has never smoked. She has never used smokeless tobacco. She reports that she drinks alcohol. She reports that she does not use drugs. family history includes Aneurysm in her paternal grandmother; Breast cancer in her maternal aunt; Coronary artery disease in her maternal grandfather and mother; Diabetes in her maternal aunt and mother; Heart attack (age of onset: 33) in her father; Heart disease in her sister; Stomach cancer in her maternal aunt;  Stroke in her maternal grandmother and paternal grandfather. Allergies  Allergen Reactions  . Chlorthalidone     01/09/13 creatinine 2.2  . Hydrochlorothiazide     Hypokalemia  . Losartan Potassium-Hctz     Hypokalemia  . Norvasc [Amlodipine Besylate]     Edema  . Raloxifene     HTN  . Rofecoxib     HTN   Current Outpatient Prescriptions on File Prior to Visit  Medication Sig Dispense Refill  . aspirin 81 MG tablet Take 81 mg by mouth at  bedtime.     . Calcium in Bone Mineral Cmplx 350 MG MISC Take 3 each by mouth at bedtime. Takes 1 in the morning and 2 at night    . carvedilol (COREG) 25 MG tablet TAKE 1/2 TABLET BY MOUTH TWICE A DAY (Patient taking differently: TAKE 12.5mg  TABLET BY MOUTH TWICE A DAY) 180 tablet 3  . Cholecalciferol (VITAMIN D3) 2000 UNITS TABS Take 1 tablet by mouth daily with lunch.     . Cyanocobalamin (VITAMIN B 12 PO) Take 1 tablet by mouth daily with lunch.    . ezetimibe-simvastatin (VYTORIN) 10-20 MG tablet TAKE 1 TABLET BY MOUTH AT BEDTIME 90 tablet 2  . folic acid (FOLVITE) A999333 MCG tablet Take 400 mcg by mouth every morning.    . furosemide (LASIX) 40 MG tablet TAKE 1 TABLET (40 MG TOTAL) BY MOUTH EVERY MORNING. 90 tablet 1  . gabapentin (NEURONTIN) 100 MG capsule Take 1 capsule (100 mg total) by mouth every 8 (eight) hours as needed (pain). 30 capsule 2  . Magnesium 400 MG CAPS Take 400 mg by mouth at bedtime.     . Omega-3 Fatty Acids (FISH OIL) 500 MG CAPS Take 500 mg by mouth daily with lunch.     . valsartan (DIOVAN) 320 MG tablet TAKE 1 TABLET EVERY DAY (Patient taking differently: TAKE 320mg  TABLET BY MOUTH ONCE DAILY) 90 tablet 3   No current facility-administered medications on file prior to visit.    Review of Systems Constitutional: Negative for increased diaphoresis, or other activity, appetite or siginficant weight change other than noted HENT: Negative for worsening hearing loss, ear pain, facial swelling, mouth sores and neck stiffness.   Eyes: Negative for other worsening pain, redness or visual disturbance.  Respiratory: Negative for choking or stridor Cardiovascular: Negative for other chest pain and palpitations.  Gastrointestinal: Negative for worsening diarrhea, blood in stool, or abdominal distention Genitourinary: Negative for hematuria, flank pain or change in urine volume.  Musculoskeletal: Negative for myalgias or other joint complaints.  Skin: Negative for other color  change and wound or drainage.  Neurological: Negative for syncope and numbness. other than noted Hematological: Negative for adenopathy. or other swelling Psychiatric/Behavioral: Negative for hallucinations, SI, self-injury, decreased concentration or other worsening agitation.  All other system neg per pt    Objective:   Physical Exam BP 138/78   Pulse 82   Temp 97.9 F (36.6 C) (Oral)   Resp 20   Wt 184 lb (83.5 kg)   SpO2 96%   BMI 35.94 kg/m  VS noted,  Constitutional: Pt is oriented to person, place, and time. Appears well-developed and well-nourished, in no significant distress Head: Normocephalic and atraumatic  Eyes: Conjunctivae and EOM are normal. Pupils are equal, round, and reactive to light Right Ear: External ear normal.  Left Ear: External ear normal Nose: Nose normal.  Mouth/Throat: Oropharynx is clear and moist  Neck: Normal range of motion. Neck supple. No JVD present.  No tracheal deviation present or significant neck LA or mass Cardiovascular: Normal rate, regular rhythm, normal heart sounds and intact distal pulses.   Pulmonary/Chest: Effort normal and breath sounds without rales or wheezing  Abdominal: Soft. Bowel sounds are normal. NT. No HSM  Musculoskeletal: Normal range of motion. Exhibits no edema Lymphadenopathy: Has no cervical adenopathy.  Neurological: Pt is alert and oriented to person, place, and time. Pt has normal reflexes. No cranial nerve deficit. Motor grossly intact Skin: Skin is warm and dry. No rash noted or new ulcers Psychiatric:  Has normal mood and affect. Behavior is normal.  No other new exam findings    Assessment & Plan:

## 2016-10-10 NOTE — Patient Instructions (Addendum)
You had the flu shot today, and the Tetanus (Tdap) shots today  .Please continue all other medications as before, and refills have been done if requested.  Please have the pharmacy call with any other refills you may need.  Please continue your efforts at being more active, low cholesterol diet, and weight control.  You are otherwise up to date with prevention measures today.  Please keep your appointments with your specialists as you may have planned  Please go to the LAB in the Basement (turn left off the elevator) for the tests to be done today  You will be contacted by phone if any changes need to be made immediately.  Otherwise, you will receive a letter about your results with an explanation, but please check with MyChart first.  Please remember to sign up for MyChart if you have not done so, as this will be important to you in the future with finding out test results, communicating by private email, and scheduling acute appointments online when needed.  Please return in 6 months, or sooner if needed, with Lab testing done 3-5 days before

## 2016-10-10 NOTE — Progress Notes (Signed)
Pre visit review using our clinic review tool, if applicable. No additional management support is needed unless otherwise documented below in the visit note. 

## 2016-10-10 NOTE — Telephone Encounter (Signed)
Please advise 

## 2016-10-11 ENCOUNTER — Encounter: Payer: Self-pay | Admitting: Internal Medicine

## 2016-10-14 NOTE — Assessment & Plan Note (Signed)
stable overall by history and exam, recent data reviewed with pt, and pt to continue medical treatment as before,  to f/u any worsening symptoms or concerns BP Readings from Last 3 Encounters:  10/10/16 138/78  07/19/16 (!) 145/89  07/12/16 130/70

## 2016-10-14 NOTE — Assessment & Plan Note (Signed)
stable overall by history and exam, recent data reviewed with pt, and pt to continue medical treatment as before,  to f/u any worsening symptoms or concerns Lab Results  Component Value Date   CREATININE 1.47 (H) 10/10/2016

## 2016-10-14 NOTE — Assessment & Plan Note (Signed)
Asympt, mild, cont diet and wt control Lab Results  Component Value Date   HGBA1C 5.8 10/10/2016

## 2016-10-14 NOTE — Assessment & Plan Note (Signed)

## 2016-10-14 NOTE — Assessment & Plan Note (Signed)
stable overall by history and exam, recent data reviewed with pt, and pt to continue medical treatment as before,  to f/u any worsening symptoms or concerns Lab Results  Component Value Date   LDLCALC 75 10/10/2016    

## 2016-10-18 ENCOUNTER — Encounter: Payer: Self-pay | Admitting: Internal Medicine

## 2016-10-18 ENCOUNTER — Ambulatory Visit (INDEPENDENT_AMBULATORY_CARE_PROVIDER_SITE_OTHER): Payer: Medicare Other | Admitting: Internal Medicine

## 2016-10-18 VITALS — BP 110/60 | HR 90 | Ht 60.0 in | Wt 184.0 lb

## 2016-10-18 DIAGNOSIS — Z950 Presence of cardiac pacemaker: Secondary | ICD-10-CM | POA: Diagnosis not present

## 2016-10-18 DIAGNOSIS — R001 Bradycardia, unspecified: Secondary | ICD-10-CM

## 2016-10-18 NOTE — Patient Instructions (Signed)
Medication Instructions:    Your physician recommends that you continue on your current medications as directed. Please refer to the Current Medication list given to you today.  --- If you need a refill on your cardiac medications before your next appointment, please call your pharmacy. ---  Labwork:  None ordered  Testing/Procedures:  None ordered  Follow-Up: Remote monitoring is used to monitor your Pacemaker of ICD from home. This monitoring reduces the number of office visits required to check your device to one time per year. It allows [place] to keep an eye on the functioning of your device to ensure it is working properly. You are scheduled for a device check from home on 01/18/16. You may send your transmission at any time that day. If you have a wireless device, the transmission will be sent automatically. After your physician reviews your transmission, you will receive a postcard with your next transmission date.   Your physician wants you to follow-up in: 9 months with Dr. Caryl Comes.  You will receive a reminder letter in the mail two months in advance. If you don't receive a letter, please call our office to schedule the follow-up appointment.  Thank you for choosing [facility]!!

## 2016-10-18 NOTE — Progress Notes (Signed)
Patient Care Team: Biagio Borg, MD as PCP - General (Internal Medicine)   HPI  Kylie Patrick is a 80 y.o. female Seen in followup for His Bundel pacemaker implanted 9/17 for symptomatic 2:1 block She had had syncope and then weakness prior to pacing  12/16 Echo  EF 55-60%  No recurrent syncope;  DOE also better Records and Results Reviewed   Past Medical History:  Diagnosis Date  . Anxiety   . Colon polyp 2003  . Complication of anesthesia    slow to wake up one time  . Gastroenteritis    w/ renal insufficiency in context of protracted nausea & vomitting 2006  . GERD (gastroesophageal reflux disease)   . H/O hiatal hernia   . Headache(784.0)    occasional  . Hyperlipidemia   . Hypertension    w/ LVH on ECHO  . Tonsillitis 2006    Past Surgical History:  Procedure Laterality Date  . ABDOMINAL HYSTERECTOMY  1976   BSO for endometriosis and bengin tumor  . APPENDECTOMY  1970  . BREAST LUMPECTOMY Left 70's   X 2  . BUNIONECTOMY Bilateral   . CATARACT EXTRACTION W/PHACO Left 05/14/2013   Procedure: CATARACT EXTRACTION PHACO AND INTRAOCULAR LENS PLACEMENT (IOC);  Surgeon: Tonny Branch, MD;  Location: [facility];  Service: Ophthalmology;  Laterality: Left;  CDE:9.76  . CATARACT EXTRACTION W/PHACO Right 06/08/2013   Procedure: CATARACT EXTRACTION PHACO AND INTRAOCULAR LENS PLACEMENT (IOC);  Surgeon: Tonny Branch, MD;  Location: [facility];  Service: Ophthalmology;  Laterality: Right;  CDE 13.65  . CHOLECYSTECTOMY  1970  . COLONOSCOPY W/ POLYPECTOMY  2003   Dr [name]  . EP IMPLANTABLE DEVICE N/A 07/18/2016   Procedure: Pacemaker Implant;  Surgeon: Deboraha Sprang, MD;  Location: [place] [facility];  Service: Cardiovascular;  Laterality: N/A;  . ESOPHAGEAL MANOMETRY N/A 10/05/2013   Procedure: ESOPHAGEAL MANOMETRY (EM);  Surgeon: Gatha Mayer, MD;  Location: [facility];  Service: Endoscopy;  Laterality: N/A;  . ESOPHAGOGASTRODUODENOSCOPY  02/05/13   Large Hiatal Hernia  .  HERNIA REPAIR    . LAPAROSCOPIC NISSEN FUNDOPLICATION Bilateral 99991111   Procedure: LAPAROSCOPIC NISSEN FUNDOPLICATION with hiatal hernia repair ;  Surgeon: Edward Jolly, MD;  Location: [facility];  Service: General;  Laterality: Bilateral;    Current Outpatient Prescriptions  Medication Sig Dispense Refill  . aspirin 81 MG tablet Take 81 mg by mouth at bedtime.     . Calcium in Bone Mineral Cmplx 350 MG MISC Take 3 each by mouth at bedtime. Takes 1 in the morning and 2 at night    . carvedilol (COREG) 25 MG tablet TAKE 1/2 TABLET BY MOUTH TWICE A DAY (Patient taking differently: TAKE 12.5mg  TABLET BY MOUTH TWICE A DAY) 180 tablet 3  . Cholecalciferol (VITAMIN D3) 2000 UNITS TABS Take 1 tablet by mouth daily with lunch.     . Cyanocobalamin (VITAMIN B 12 PO) Take 1 tablet by mouth daily with lunch.    . esomeprazole (NEXIUM) 40 MG capsule Take 1 capsule (40 mg total) by mouth 2 (two) times daily before a meal. 180 capsule 1  . ezetimibe-simvastatin (VYTORIN) 10-20 MG tablet TAKE 1 TABLET BY MOUTH AT BEDTIME 90 tablet 2  . folic acid (FOLVITE) A999333 MCG tablet Take 400 mcg by mouth every morning.    . furosemide (LASIX) 40 MG tablet TAKE 1 TABLET (40 MG TOTAL) BY MOUTH EVERY MORNING. 90 tablet 1  . gabapentin (NEURONTIN) 100  MG capsule Take 1 capsule (100 mg total) by mouth every 8 (eight) hours as needed (pain). 30 capsule 2  . Magnesium 400 MG CAPS Take 400 mg by mouth at bedtime.     . Omega-3 Fatty Acids (FISH OIL) 500 MG CAPS Take 500 mg by mouth daily with lunch.     . valsartan (DIOVAN) 320 MG tablet TAKE 1 TABLET EVERY DAY (Patient taking differently: TAKE 320mg  TABLET BY MOUTH ONCE DAILY) 90 tablet 3   No current facility-administered medications for this visit.     Allergies  Allergen Reactions  . Chlorthalidone     01/09/13 creatinine 2.2  . Hydrochlorothiazide     Hypokalemia  . Losartan Potassium-Hctz     Hypokalemia  . Norvasc [Amlodipine Besylate]     Edema  .  Raloxifene     HTN  . Rofecoxib     HTN      Review of Systems negative except from HPI and PMH  Physical Exam BP 110/60   Pulse 90   Ht 5' (1.524 m)   Wt 184 lb (83.5 kg)   SpO2 92%   BMI 35.94 kg/m  Well developed and well nourished in no acute distress HENT normal E scleral and icterus clear Neck Supple JVP flat; carotids brisk and full Clear to ausculation Device pocket well healed; without hematoma or erythema.  There is no tethering Regular rate and rhythm, no murmurs gallops or rub Soft with active bowel sounds No clubbing cyanosis  Edema Alert and oriented, grossly normal motor and sensory function Skin Warm and Dry  ECG  P-synchronous/ AV  pacing with QRS 120 msec and what appears to be fusion   Assessment and  Plan  2:1 AVB  Pacer- His Medtronic The patient's device was interrogated and the information was fully reviewed.  The device was reprogrammed to maximize longevity and prevent auto titration of output   Syncope     No recurrent syncope  sTable Pacing        Current medicines are reviewed at length with the patient today .  The patient does not  have concerns regarding medicines.

## 2016-10-23 LAB — CUP PACEART INCLINIC DEVICE CHECK
Battery Voltage: 3.03 V
Brady Statistic AP VP Percent: 8.3 %
Brady Statistic AS VS Percent: 0.1 % — CL
Implantable Lead Implant Date: 20170906
Implantable Lead Location: 753860
Implantable Lead Model: 3830
Implantable Lead Model: 5076
Implantable Pulse Generator Implant Date: 20170906
Lead Channel Impedance Value: 608 Ohm
Lead Channel Pacing Threshold Amplitude: 0.75 V
Lead Channel Pacing Threshold Pulse Width: 0.4 ms
Lead Channel Sensing Intrinsic Amplitude: 4.1 mV
Lead Channel Setting Pacing Amplitude: 2 V
Lead Channel Setting Sensing Sensitivity: 0.9 mV
MDC IDC LEAD IMPLANT DT: 20170906
MDC IDC LEAD LOCATION: 753859
MDC IDC MSMT BATTERY REMAINING LONGEVITY: 102 mo
MDC IDC MSMT LEADCHNL RA PACING THRESHOLD AMPLITUDE: 0.75 V
MDC IDC MSMT LEADCHNL RV IMPEDANCE VALUE: 456 Ohm
MDC IDC MSMT LEADCHNL RV PACING THRESHOLD PULSEWIDTH: 1 ms
MDC IDC MSMT LEADCHNL RV SENSING INTR AMPL: 4.6 mV
MDC IDC SESS DTM: 20171212143328
MDC IDC SET LEADCHNL RA PACING AMPLITUDE: 2 V
MDC IDC SET LEADCHNL RV PACING PULSEWIDTH: 1 ms
MDC IDC STAT BRADY AP VS PERCENT: 0.1 % — AB
MDC IDC STAT BRADY AS VP PERCENT: 91.7 %

## 2016-11-29 ENCOUNTER — Other Ambulatory Visit: Payer: Self-pay | Admitting: Internal Medicine

## 2016-11-30 MED ORDER — VALSARTAN 320 MG PO TABS: 320.0000 mg | ORAL_TABLET | Freq: Every day | ORAL | 0 refills | Status: DC

## 2016-11-30 NOTE — Telephone Encounter (Signed)
[place] for one month only  Please ask pt to make ROV

## 2017-01-02 ENCOUNTER — Other Ambulatory Visit: Payer: Self-pay | Admitting: *Deleted

## 2017-01-02 MED ORDER — VALSARTAN 320 MG PO TABS: 320.0000 mg | ORAL_TABLET | Freq: Every day | ORAL | 2 refills | Status: DC

## 2017-01-07 ENCOUNTER — Other Ambulatory Visit: Payer: Self-pay | Admitting: Internal Medicine

## 2017-01-17 ENCOUNTER — Ambulatory Visit (INDEPENDENT_AMBULATORY_CARE_PROVIDER_SITE_OTHER): Payer: Medicare Other | Admitting: *Deleted

## 2017-01-17 DIAGNOSIS — R001 Bradycardia, unspecified: Secondary | ICD-10-CM

## 2017-01-17 NOTE — Progress Notes (Signed)
Remote pacemaker transmission.   

## 2017-01-18 ENCOUNTER — Encounter: Payer: Self-pay | Admitting: Cardiology

## 2017-01-18 LAB — CUP PACEART REMOTE DEVICE CHECK
Battery Remaining Longevity: 101 mo
Brady Statistic AP VP Percent: 8.8 %
Brady Statistic AP VS Percent: 0 %
Brady Statistic AS VP Percent: 91.19 %
Brady Statistic AS VS Percent: 0.01 %
Brady Statistic RV Percent Paced: 99.83 %
Implantable Lead Implant Date: 20170906
Implantable Lead Location: 753859
Implantable Lead Location: 753860
Implantable Lead Model: 5076
Lead Channel Impedance Value: 437 Ohm
Lead Channel Impedance Value: 475 Ohm
Lead Channel Impedance Value: 627 Ohm
Lead Channel Pacing Threshold Amplitude: 1.5 V
Lead Channel Sensing Intrinsic Amplitude: 4.125 mV
Lead Channel Sensing Intrinsic Amplitude: 4.875 mV
Lead Channel Setting Pacing Amplitude: 2 V
Lead Channel Setting Sensing Sensitivity: 0.9 mV
MDC IDC LEAD IMPLANT DT: 20170906
MDC IDC MSMT BATTERY VOLTAGE: 3.02 V
MDC IDC MSMT LEADCHNL RA PACING THRESHOLD AMPLITUDE: 0.625 V
MDC IDC MSMT LEADCHNL RA PACING THRESHOLD PULSEWIDTH: 0.4 ms
MDC IDC MSMT LEADCHNL RA SENSING INTR AMPL: 4.125 mV
MDC IDC MSMT LEADCHNL RV IMPEDANCE VALUE: 323 Ohm
MDC IDC MSMT LEADCHNL RV PACING THRESHOLD PULSEWIDTH: 0.4 ms
MDC IDC MSMT LEADCHNL RV SENSING INTR AMPL: 4.875 mV
MDC IDC PG IMPLANT DT: 20170906
MDC IDC SESS DTM: 20180307154641
MDC IDC SET LEADCHNL RA PACING AMPLITUDE: 2 V
MDC IDC SET LEADCHNL RV PACING PULSEWIDTH: 1 ms
MDC IDC STAT BRADY RA PERCENT PACED: 8.79 %

## 2017-02-03 ENCOUNTER — Other Ambulatory Visit: Payer: Self-pay | Admitting: Internal Medicine

## 2017-04-04 ENCOUNTER — Other Ambulatory Visit: Payer: Self-pay | Admitting: Internal Medicine

## 2017-04-12 ENCOUNTER — Other Ambulatory Visit (INDEPENDENT_AMBULATORY_CARE_PROVIDER_SITE_OTHER): Payer: Medicare Other

## 2017-04-12 ENCOUNTER — Ambulatory Visit (INDEPENDENT_AMBULATORY_CARE_PROVIDER_SITE_OTHER): Payer: Medicare Other | Admitting: Internal Medicine

## 2017-04-12 ENCOUNTER — Telehealth: Payer: Self-pay

## 2017-04-12 ENCOUNTER — Other Ambulatory Visit: Payer: Self-pay | Admitting: Internal Medicine

## 2017-04-12 ENCOUNTER — Encounter: Payer: Self-pay | Admitting: Internal Medicine

## 2017-04-12 VITALS — BP 116/84 | HR 73 | Ht 60.0 in | Wt 185.0 lb

## 2017-04-12 DIAGNOSIS — N183 Chronic kidney disease, stage 3 unspecified: Secondary | ICD-10-CM

## 2017-04-12 DIAGNOSIS — Z0000 Encounter for general adult medical examination without abnormal findings: Secondary | ICD-10-CM

## 2017-04-12 DIAGNOSIS — N182 Chronic kidney disease, stage 2 (mild): Secondary | ICD-10-CM | POA: Diagnosis not present

## 2017-04-12 DIAGNOSIS — R739 Hyperglycemia, unspecified: Secondary | ICD-10-CM

## 2017-04-12 DIAGNOSIS — I10 Essential (primary) hypertension: Secondary | ICD-10-CM | POA: Diagnosis not present

## 2017-04-12 DIAGNOSIS — E782 Mixed hyperlipidemia: Secondary | ICD-10-CM | POA: Diagnosis not present

## 2017-04-12 LAB — HEMOGLOBIN A1C: Hgb A1c MFr Bld: 6 % (ref 4.6–6.5)

## 2017-04-12 LAB — HEPATIC FUNCTION PANEL
ALBUMIN: 4.3 g/dL (ref 3.5–5.2)
ALT: 20 U/L (ref 0–35)
AST: 22 U/L (ref 0–37)
Alkaline Phosphatase: 56 U/L (ref 39–117)
Bilirubin, Direct: 0.2 mg/dL (ref 0.0–0.3)
Total Bilirubin: 0.6 mg/dL (ref 0.2–1.2)
Total Protein: 7.1 g/dL (ref 6.0–8.3)

## 2017-04-12 LAB — BASIC METABOLIC PANEL
BUN: 28 mg/dL — AB (ref 6–23)
CO2: 31 mEq/L (ref 19–32)
Calcium: 9.7 mg/dL (ref 8.4–10.5)
Chloride: 105 mEq/L (ref 96–112)
Creatinine, Ser: 1.5 mg/dL — ABNORMAL HIGH (ref 0.40–1.20)
GFR: 35.61 mL/min — AB (ref >60.00)
GLUCOSE: 112 mg/dL — AB (ref 70–99)
POTASSIUM: 4 meq/L (ref 3.5–5.1)
Sodium: 143 mEq/L (ref 135–145)

## 2017-04-12 LAB — LIPID PANEL
CHOLESTEROL: 150 mg/dL (ref 0–200)
HDL: 53.5 mg/dL (ref >39.00)
LDL CALC: 75 mg/dL (ref 0–99)
NonHDL: 96.47
TRIGLYCERIDES: 106 mg/dL (ref 0.0–149.0)
Total CHOL/HDL Ratio: 3
VLDL: 21.2 mg/dL (ref 0.0–40.0)

## 2017-04-12 NOTE — Assessment & Plan Note (Signed)
stable overall by history and exam, recent data reviewed with pt, and pt to continue medical treatment as before,  to f/u any worsening symptoms or concerns BP Readings from Last 3 Encounters:  04/12/17 116/84  10/18/16 110/60  10/10/16 138/78

## 2017-04-12 NOTE — Assessment & Plan Note (Signed)
stable overall by history and exam, recent data reviewed with pt, and pt to continue medical treatment as before,  to f/u any worsening symptoms or concerns Lab Results  Component Value Date   CREATININE 1.47 (H) 10/10/2016   Consider renal referral for long term f/u

## 2017-04-12 NOTE — Patient Instructions (Addendum)
Please continue all other medications as before, and refills have been done if requested.  Please have the pharmacy call with any other refills you may need.  Please continue your efforts at being more active, low cholesterol diet, and weight control.  You are otherwise up to date with prevention measures today.  Please keep your appointments with your specialists as you may have planned  Please go to the LAB in the Basement (turn left off the elevator) for the tests to be done today  You will be contacted by phone if any changes need to be made immediately.  Otherwise, you will receive a letter about your results with an explanation, but please check with MyChart first.  Please remember to sign up for MyChart if you have not done so, as this will be important to you in the future with finding out test results, communicating by private email, and scheduling acute appointments online when needed.  If you have Medicare related insurance (such as traditional Medicare, [employer] or [employer], or similar), Please make an appointment at the [facility] with Sharee Pimple, the [employer], for your Wellness Visit in this office, which is a benefit with your insurance.' Please return in 6 months, or sooner if needed, with Lab testing done 3-5 days before

## 2017-04-12 NOTE — Telephone Encounter (Signed)
-----   Message from Biagio Borg, MD sent at 04/12/2017 12:45 PM EDT ----- Left message on MyChart, pt to cont same tx except   The test results show that your current treatment is OK, except the kidney function is quite low, but not worse than before.  As we discussed, we will refer you to Nephrology (kidney doctors) for help in trying to not allow this to get worse.  You should hear hopefully soon.  Shirron to please inform pt, I will do referral

## 2017-04-12 NOTE — Assessment & Plan Note (Signed)
stable overall by history and exam, recent data reviewed with pt, and pt to continue medical treatment as before,  to f/u any worsening symptoms or concerns Lab Results  Component Value Date   LDLCALC 75 10/10/2016

## 2017-04-12 NOTE — Telephone Encounter (Signed)
Pt has been informed and expressed understanding.  

## 2017-04-12 NOTE — Assessment & Plan Note (Signed)
stable overall by history and exam, recent data reviewed with pt, and pt to continue medical treatment as before,  to f/u any worsening symptoms or concerns Lab Results  Component Value Date   HGBA1C 5.8 10/10/2016

## 2017-04-12 NOTE — Progress Notes (Signed)
Subjective:    Patient ID: Kylie Patrick, female    DOB: 04-04-1936, 81 y.o.   MRN: [id number]  HPI Here to f/u; overall doing ok,  Pt denies chest pain, increasing sob or doe, wheezing, orthopnea, PND, increased LE swelling, palpitations, dizziness or syncope.  Pt denies new neurological symptoms such as new headache, or facial or extremity weakness or numbness.  Pt denies polydipsia, polyuria, or low sugar episode.   Pt denies new neurological symptoms such as new headache, or facial or extremity weakness or numbness.   Pt states overall good compliance with meds, mostly trying to follow appropriate diet, with wt overall stable,  but little exercise however. Only take the gabapentin unless head pain starts up, only as needed.  Does have mild trigger finger 4th finger left hand worse in the past 3 wks but has sort of done her own PT and improved, does not want hand surgury eval for now. Past Medical History:  Diagnosis Date  . Anxiety   . Colon polyp 2003  . Complication of anesthesia    slow to wake up one time  . Gastroenteritis    w/ renal insufficiency in context of protracted nausea & vomitting 2006  . GERD (gastroesophageal reflux disease)   . H/O hiatal hernia   . Headache(784.0)    occasional  . Hyperlipidemia   . Hypertension    w/ LVH on ECHO  . Tonsillitis 2006   Past Surgical History:  Procedure Laterality Date  . ABDOMINAL HYSTERECTOMY  1976   BSO for endometriosis and bengin tumor  . APPENDECTOMY  1970  . BREAST LUMPECTOMY Left 70's   X 2  . BUNIONECTOMY Bilateral   . CATARACT EXTRACTION W/PHACO Left 05/14/2013   Procedure: CATARACT EXTRACTION PHACO AND INTRAOCULAR LENS PLACEMENT (IOC);  Surgeon: Tonny Branch, MD;  Location: [facility];  Service: Ophthalmology;  Laterality: Left;  CDE:9.76  . CATARACT EXTRACTION W/PHACO Right 06/08/2013   Procedure: CATARACT EXTRACTION PHACO AND INTRAOCULAR LENS PLACEMENT (IOC);  Surgeon: Tonny Branch, MD;  Location: [facility];  Service:  Ophthalmology;  Laterality: Right;  CDE 13.65  . CHOLECYSTECTOMY  1970  . COLONOSCOPY W/ POLYPECTOMY  2003   Dr [name]  . EP IMPLANTABLE DEVICE N/A 07/18/2016   Procedure: Pacemaker Implant;  Surgeon: Deboraha Sprang, MD;  Location: [place] [facility];  Service: Cardiovascular;  Laterality: N/A;  . ESOPHAGEAL MANOMETRY N/A 10/05/2013   Procedure: ESOPHAGEAL MANOMETRY (EM);  Surgeon: Gatha Mayer, MD;  [facility];  Service: Endoscopy;  Laterality: N/A;  . ESOPHAGOGASTRODUODENOSCOPY  02/05/13   Large Hiatal Hernia  . HERNIA REPAIR    . LAPAROSCOPIC NISSEN FUNDOPLICATION Bilateral 84/53/6468   Procedure: LAPAROSCOPIC NISSEN FUNDOPLICATION with hiatal hernia repair ;  Surgeon: Edward Jolly, MD;  Location: [facility];  Service: General;  Laterality: Bilateral;    reports that she has never smoked. She has never used smokeless tobacco. She reports that she drinks alcohol. She reports that she does not use drugs. family history includes Aneurysm in her paternal grandmother; Breast cancer in her maternal aunt; Coronary artery disease in her maternal grandfather and mother; Diabetes in her maternal aunt and mother; Heart attack (age of onset: 37) in her father; Heart disease in her sister; Stomach cancer in her maternal aunt; Stroke in her maternal grandmother and paternal grandfather. Allergies  Allergen Reactions  . Chlorthalidone     01/09/13 creatinine 2.2  . Hydrochlorothiazide     Hypokalemia  . Losartan Potassium-Hctz  Hypokalemia  . Norvasc [Amlodipine Besylate]     Edema  . Raloxifene     HTN  . Rofecoxib     HTN   Current Outpatient Prescriptions on File Prior to Visit  Medication Sig Dispense Refill  . aspirin 81 MG tablet Take 81 mg by mouth at bedtime.     . Calcium in Bone Mineral Cmplx 350 MG MISC Take 3 each by mouth at bedtime. Takes 1 in the morning and 2 at night    . carvedilol (COREG) 25 MG tablet TAKE 1/2 TABLET BY MOUTH TWICE A DAY 180 tablet 3  .  Cholecalciferol (VITAMIN D3) 2000 UNITS TABS Take 1 tablet by mouth daily with lunch.     . Cyanocobalamin (VITAMIN B 12 PO) Take 1 tablet by mouth daily with lunch.    . esomeprazole (NEXIUM) 40 MG capsule Take 1 capsule (40 mg total) by mouth 2 (two) times daily before a meal. 180 capsule 1  . ezetimibe-simvastatin (VYTORIN) 10-20 MG tablet TAKE 1 TABLET BY MOUTH AT BEDTIME 90 tablet 2  . folic acid (FOLVITE) 505 MCG tablet Take 400 mcg by mouth every morning.    . furosemide (LASIX) 40 MG tablet TAKE 1 TABLET BY MOUTH EVERY MORNING 90 tablet 1  . gabapentin (NEURONTIN) 100 MG capsule Take 1 capsule (100 mg total) by mouth every 8 (eight) hours as needed (pain). 30 capsule 2  . Magnesium 400 MG CAPS Take 400 mg by mouth at bedtime.     . Omega-3 Fatty Acids (FISH OIL) 500 MG CAPS Take 500 mg by mouth daily with lunch.     . valsartan (DIOVAN) 320 MG tablet Take 1 tablet (320 mg total) by mouth daily. 90 tablet 2   No current facility-administered medications on file prior to visit.    Review of Systems  Constitutional: Negative for other unusual diaphoresis or sweats HENT: Negative for ear discharge or swelling Eyes: Negative for other worsening visual disturbances Respiratory: Negative for stridor or other swelling  Gastrointestinal: Negative for worsening distension or other blood Genitourinary: Negative for retention or other urinary change Musculoskeletal: Negative for other MSK pain or swelling Skin: Negative for color change or other new lesions Neurological: Negative for worsening tremors and other numbness  Psychiatric/Behavioral: Negative for worsening agitation or other fatigue All other system neg per pt    Objective:   Physical Exam BP 116/84   Pulse 73   Ht 5' (1.524 m)   Wt 185 lb (83.9 kg)   SpO2 100%   BMI 36.13 kg/m  VS noted,  Constitutional: Pt appears in NAD HENT: Head: NCAT.  Right Ear: External ear normal.  Left Ear: External ear normal.  Eyes: . Pupils  are equal, round, and reactive to light. Conjunctivae and EOM are normal Nose: without d/c or deformity Neck: Neck supple. Gross normal ROM Cardiovascular: Normal rate and regular rhythm.   Pulmonary/Chest: Effort normal and breath sounds without rales or wheezing.  Abd:  Soft, NT, ND, + BS, no organomegaly 4th finger left hand with slight catch on extension o/w NT and FROM Neurological: Pt is alert. At baseline orientation, motor grossly intact Skin: Skin is warm. No rashes, other new lesions, no LE edema Psychiatric: Pt behavior is normal without agitation  No other exam findings Lab Results  Component Value Date   WBC 6.0 10/10/2016   HGB 13.2 10/10/2016   HCT 39.8 10/10/2016   PLT 219.0 10/10/2016   GLUCOSE 102 (H) 10/10/2016   CHOL  147 10/10/2016   TRIG 100.0 10/10/2016   HDL 51.80 10/10/2016   LDLCALC 75 10/10/2016   ALT 20 10/10/2016   AST 25 10/10/2016   NA 144 10/10/2016   K 3.9 10/10/2016   CL 104 10/10/2016   CREATININE 1.47 (H) 10/10/2016   BUN 23 10/10/2016   CO2 32 10/10/2016   TSH 1.47 10/10/2016   HGBA1C 5.8 10/10/2016   MICROALBUR 2.1 (H) 10/02/2010       Assessment & Plan:

## 2017-04-17 ENCOUNTER — Encounter: Payer: Self-pay | Admitting: Internal Medicine

## 2017-04-17 ENCOUNTER — Ambulatory Visit (INDEPENDENT_AMBULATORY_CARE_PROVIDER_SITE_OTHER): Payer: Medicare Other | Admitting: Internal Medicine

## 2017-04-17 VITALS — BP 142/72 | HR 84 | Ht 60.0 in | Wt 186.0 lb

## 2017-04-17 DIAGNOSIS — R1110 Vomiting, unspecified: Secondary | ICD-10-CM | POA: Diagnosis not present

## 2017-04-17 DIAGNOSIS — R1310 Dysphagia, unspecified: Secondary | ICD-10-CM

## 2017-04-17 DIAGNOSIS — Z9889 Other specified postprocedural states: Secondary | ICD-10-CM

## 2017-04-17 DIAGNOSIS — R1319 Other dysphagia: Secondary | ICD-10-CM

## 2017-04-17 DIAGNOSIS — IMO0001 Reserved for inherently not codable concepts without codable children: Secondary | ICD-10-CM

## 2017-04-17 NOTE — Progress Notes (Signed)
   THESSALY MCCULLERS 81 y.o. 1936-01-23 [id number]  Assessment & Plan:   Encounter Diagnoses  Name Primary?  . Esophageal dysphagia Yes  . History of Nissen fundoplication   . Regurgitation    Evaluate w/ upper GI series - ? Slipped fundoplication, other problem  Further plans pending that   Subjective:   Chief Complaint: regurgitation, dysphagia  HPI  81 yo ww w/ hx fundoplication and has dysphiagia to solid foods and regurgitation of food and liquids at times. EGD 2016 - intact wrap w/ 5 cm hiatal hernia. Is  on PPI now. Medications, allergies, past medical history, past surgical history, family history and social history are reviewed and updated in the EMR.  Review of Systems As above  Objective:   Physical Exam BP (!) 142/72 (BP Location: Left Arm, Patient Position: Sitting, Cuff Size: Normal)   Pulse 84   Ht 5' (1.524 m)   Wt 186 lb (84.4 kg)   BMI 36.33 kg/m  Anicteric Lungs cta Cor s1s2 no rmg abd obese, soft NT Alert and oriented and pleasant appropriate affect  15 minutes time spent with patient > half in counseling coordination of care

## 2017-04-17 NOTE — Patient Instructions (Signed)
  You have been scheduled for an Upper GI Series at [facility]. Your appointment is on 04/19/17 at 8:30AM. Please arrive 15 minutes prior to your test for registration. Make sure not to eat or drink anything after midnight on the night before your test. If you need to reschedule, please call radiology at [phone]. ________________________________________________________________ An upper GI series uses x rays to help diagnose problems of the upper GI tract, which includes the esophagus, stomach, and duodenum. The duodenum is the first part of the small intestine. An upper GI series is conducted by a radiology technologist or a radiologist-a doctor who specializes in x-ray imaging-at a hospital or outpatient center. While sitting or standing in front of an x-ray machine, the patient drinks barium liquid, which is often white and has a chalky consistency and taste. The barium liquid coats the lining of the upper GI tract and makes signs of disease show up more clearly on x rays. X-ray video, called fluoroscopy, is used to view the barium liquid moving through the esophagus, stomach, and duodenum. Additional x rays and fluoroscopy are performed while the patient lies on an x-ray table. To fully coat the upper GI tract with barium liquid, the technologist or radiologist may press on the abdomen or ask the patient to change position. Patients hold still in various positions, allowing the technologist or radiologist to take x rays of the upper GI tract at different angles. If a technologist conducts the upper GI series, a radiologist will later examine the images to look for problems.  This test typically takes about 1 hour to complete. __________________________________________________________________   I appreciate the opportunity to care for you. Silvano Rusk, MD, [facility]

## 2017-04-18 ENCOUNTER — Ambulatory Visit (INDEPENDENT_AMBULATORY_CARE_PROVIDER_SITE_OTHER): Payer: Medicare Other | Admitting: *Deleted

## 2017-04-18 DIAGNOSIS — R001 Bradycardia, unspecified: Secondary | ICD-10-CM

## 2017-04-19 ENCOUNTER — Ambulatory Visit (HOSPITAL_COMMUNITY)
Admission: RE | Admit: 2017-04-19 | Discharge: 2017-04-19 | Disposition: A | Discharge status: Home / Self Care | Payer: Medicare Other | Source: Ambulatory Visit | Attending: Internal Medicine | Admitting: Internal Medicine

## 2017-04-19 DIAGNOSIS — Z9889 Other specified postprocedural states: Secondary | ICD-10-CM | POA: Insufficient documentation

## 2017-04-19 DIAGNOSIS — IMO0001 Reserved for inherently not codable concepts without codable children: Secondary | ICD-10-CM

## 2017-04-19 DIAGNOSIS — K449 Diaphragmatic hernia without obstruction or gangrene: Secondary | ICD-10-CM | POA: Insufficient documentation

## 2017-04-19 DIAGNOSIS — R1110 Vomiting, unspecified: Secondary | ICD-10-CM | POA: Diagnosis not present

## 2017-04-19 DIAGNOSIS — R1310 Dysphagia, unspecified: Secondary | ICD-10-CM | POA: Diagnosis not present

## 2017-04-19 DIAGNOSIS — R1319 Other dysphagia: Secondary | ICD-10-CM

## 2017-04-19 DIAGNOSIS — K219 Gastro-esophageal reflux disease without esophagitis: Secondary | ICD-10-CM | POA: Insufficient documentation

## 2017-04-19 LAB — CUP PACEART REMOTE DEVICE CHECK
Brady Statistic AP VP Percent: 10.21 %
Brady Statistic AP VS Percent: 0 %
Brady Statistic AS VP Percent: 89.78 %
Brady Statistic RA Percent Paced: 10.2 %
Implantable Lead Implant Date: 20170906
Implantable Lead Location: 753859
Implantable Lead Location: 753860
Implantable Lead Model: 3830
Implantable Lead Model: 5076
Lead Channel Impedance Value: 323 Ohm
Lead Channel Impedance Value: 475 Ohm
Lead Channel Pacing Threshold Pulse Width: 0.4 ms
Lead Channel Pacing Threshold Pulse Width: 0.4 ms
Lead Channel Sensing Intrinsic Amplitude: 3 mV
Lead Channel Sensing Intrinsic Amplitude: 5.75 mV
Lead Channel Sensing Intrinsic Amplitude: 5.75 mV
Lead Channel Setting Pacing Amplitude: 2 V
Lead Channel Setting Pacing Amplitude: 2 V
Lead Channel Setting Pacing Pulse Width: 1 ms
MDC IDC LEAD IMPLANT DT: 20170906
MDC IDC MSMT BATTERY REMAINING LONGEVITY: 94 mo
MDC IDC MSMT BATTERY VOLTAGE: 3.01 V
MDC IDC MSMT LEADCHNL RA IMPEDANCE VALUE: 608 Ohm
MDC IDC MSMT LEADCHNL RA PACING THRESHOLD AMPLITUDE: 0.625 V
MDC IDC MSMT LEADCHNL RA SENSING INTR AMPL: 3 mV
MDC IDC MSMT LEADCHNL RV IMPEDANCE VALUE: 418 Ohm
MDC IDC MSMT LEADCHNL RV PACING THRESHOLD AMPLITUDE: 2 V
MDC IDC PG IMPLANT DT: 20170906
MDC IDC SESS DTM: 20180607221716
MDC IDC SET LEADCHNL RV SENSING SENSITIVITY: 0.9 mV
MDC IDC STAT BRADY AS VS PERCENT: 0.01 %
MDC IDC STAT BRADY RV PERCENT PACED: 99.94 %

## 2017-04-19 NOTE — Progress Notes (Signed)
Remote pacemaker transmission.   

## 2017-04-21 ENCOUNTER — Encounter: Payer: Self-pay | Admitting: Internal Medicine

## 2017-04-23 NOTE — Progress Notes (Signed)
Tell her it shows a small hiatal hernia - which could mean the wrap has slipped or could be the way she is after surgery - I did see small hiatal hernia after fundoplication at EGD 9872 I think she needs a repeat EGD and depending upon what I see could dilate to help swallowing Looks like there is an 1130 next Friday If that does not work and terribly long wit I could do a 730 sometime

## 2017-04-24 ENCOUNTER — Ambulatory Visit (AMBULATORY_SURGERY_CENTER): Payer: Self-pay | Admitting: *Deleted

## 2017-04-24 ENCOUNTER — Encounter: Payer: Self-pay | Admitting: Cardiology

## 2017-04-24 ENCOUNTER — Encounter: Payer: Self-pay | Admitting: Internal Medicine

## 2017-04-24 VITALS — Ht 60.0 in | Wt 186.0 lb

## 2017-04-24 DIAGNOSIS — R1310 Dysphagia, unspecified: Secondary | ICD-10-CM

## 2017-04-24 NOTE — Progress Notes (Signed)
Pt's states no medical or surgical changes since  office visit. 

## 2017-04-24 NOTE — Progress Notes (Signed)
Patient denies any allergies to eggs or soy. Patient has a pacemaker. Patient denies any problems with anesthesia/sedation. Patient denies any oxygen use at home and does not take any diet/weight loss medications. No e-mail per pt.

## 2017-04-29 ENCOUNTER — Other Ambulatory Visit: Payer: Self-pay | Admitting: Internal Medicine

## 2017-05-03 ENCOUNTER — Ambulatory Visit (AMBULATORY_SURGERY_CENTER): Payer: Medicare Other | Admitting: Internal Medicine

## 2017-05-03 ENCOUNTER — Encounter: Payer: Self-pay | Admitting: Internal Medicine

## 2017-05-03 VITALS — BP 121/53 | HR 60 | Temp 97.3°F | Resp 20 | Ht 60.0 in | Wt 186.0 lb

## 2017-05-03 DIAGNOSIS — R1310 Dysphagia, unspecified: Secondary | ICD-10-CM

## 2017-05-03 DIAGNOSIS — R1319 Other dysphagia: Secondary | ICD-10-CM

## 2017-05-03 MED ORDER — SODIUM CHLORIDE 0.9 % IV SOLN: 500.0000 mL | INTRAVENOUS | Status: DC

## 2017-05-03 NOTE — Patient Instructions (Addendum)
The stomach is still slipped some - I did not see anything to dilate.  I think your options are to modify the diet and/or try a medication called metaclopramide to help reduce reflux and regurgitation.   Please go ahead and follow the Dysphagia 3 Diet and after 1-2 weeks call me back to let me know if that is working none, some or all.  After that we can decide about stopping Nexium and/or adding medication.  I appreciate the opportunity to care for you. Gatha Mayer, MD, [facility]  Hiatal hernia handout given to patient. Diet recommendation handout given to patient.  YOU HAD AN ENDOSCOPIC PROCEDURE TODAY AT [facility]:   Refer to the procedure report that was given to you for any specific questions about what was found during the examination.  If the procedure report does not answer your questions, please call your gastroenterologist to clarify.  If you requested that your care partner not be given the details of your procedure findings, then the procedure report has been included in a sealed envelope for you to review at your convenience later.  YOU SHOULD EXPECT: Some feelings of bloating in the abdomen. Passage of more gas than usual.  Walking can help get rid of the air that was put into your GI tract during the procedure and reduce the bloating. If you had a lower endoscopy (such as a colonoscopy or flexible sigmoidoscopy) you may notice spotting of blood in your stool or on the toilet paper. If you underwent a bowel prep for your procedure, you may not have a normal bowel movement for a few days.  Please Note:  You might notice some irritation and congestion in your nose or some drainage.  This is from the oxygen used during your procedure.  There is no need for concern and it should clear up in a day or so. Please Note:  You might notice some irritation and congestion in your nose or some drainage.  This is from the oxygen used during your procedure.  There is no  need for concern and it should clear up in a day or so.  SYMPTOMS TO REPORT IMMEDIATELY:  Following upper endoscopy (EGD)  Vomiting of blood or coffee ground material  New chest pain or pain under the shoulder blades  Painful or persistently difficult swallowing  New shortness of breath  Fever of 100F or higher  Black, tarry-looking stools  For urgent or emergent issues, a gastroenterologist can be reached at any hour by calling [phone].   DIET:  We do recommend a small meal at first, but then you may proceed to your regular diet.  Drink plenty of fluids but you should avoid alcoholic beverages for 24 hours.  ACTIVITY:  You should plan to take it easy for the rest of today and you should NOT DRIVE or use heavy machinery until tomorrow (because of the sedation medicines used during the test).    FOLLOW UP: Our staff will call the number listed on your records the next business day following your procedure to check on you and address any questions or concerns that you may have regarding the information given to you following your procedure. If we do not reach you, we will leave a message.  However, if you are feeling well and you are not experiencing any problems, there is no need to return our call.  We will assume that you have returned to your regular daily activities without incident.  If any biopsies were taken you will be contacted by phone or by letter within the next 1-3 weeks.  Please call us at [phone] if you have not heard about the biopsies in 3 weeks.    SIGNATURES/CONFIDENTIALITY: You and/or your care partner have signed paperwork which will be entered into your electronic medical record.  These signatures attest to the fact that that the information above on your After Visit Summary has been reviewed and is understood.  Full responsibility of the confidentiality of this discharge information lies with you and/or your care-partner.

## 2017-05-03 NOTE — Op Note (Addendum)
[facility] Patient Name: Kylie Patrick Procedure Date: 05/03/2017 12:26 PM MRN: [id number] Endoscopist: Gatha Mayer , MD Age: 81 Referring MD:  Date of Birth: 06-02-1936 Gender: Female Account #: [id number] Procedure:                Upper GI endoscopy Indications:              Dysphagia, Esophageal reflux Medicines:                Propofol per Anesthesia, Monitored Anesthesia Care Procedure:                Pre-Anesthesia Assessment:                           - Prior to the procedure, a History and Physical                            was performed, and patient medications and                            allergies were reviewed. The patient's tolerance of                            previous anesthesia was also reviewed. The risks                            and benefits of the procedure and the sedation                            options and risks were discussed with the patient.                            All questions were answered, and informed consent                            was obtained. Prior Anticoagulants: The patient has                            taken no previous anticoagulant or antiplatelet                            agents. ASA Grade Assessment: II - A patient with                            mild systemic disease. After reviewing the risks                            and benefits, the patient was deemed in                            satisfactory condition to undergo the procedure.                           After obtaining informed consent, the endoscope was  passed under direct vision. Throughout the                            procedure, the patient's blood pressure, pulse, and                            oxygen saturations were monitored continuously. The                            Model GIF-[id number]) scope was introduced                            through the mouth, and advanced to the second part   of duodenum. The upper GI endoscopy was                            accomplished without difficulty. The patient                            tolerated the procedure well. Scope In: Scope Out: Findings:                 The examined esophagus was normal.                           A 3 cm hiatal hernia was found. The hiatal                            narrowing was 37 cm from the incisors. The Z-line                            was 34 cm from the incisors.                           Evidence of a Slipped fundoplication was found in                            the gastric body. The wrap appeared loose. This was                            traversed.                           The exam was otherwise without abnormality.                           The cardia and gastric fundus were otherwise normal                            on retroflexion. Complications:            No immediate complications. Estimated Blood Loss:     Estimated blood loss: none. Impression:               - Normal esophagus.                           -  3 cm hiatal hernia.                           - A Slipped fundoplication was found. The wrap                            appears loose.                           - The examination was otherwise normal.                           - No specimens collected. Recommendation:           - Patient has a contact number available for                            emergencies. The signs and symptoms of potential                            delayed complications were discussed with the                            patient. Return to normal activities tomorrow.                            Written discharge instructions were provided to the                            patient.                           - Dysphagia 3.                           - Consider adding low dose metaclopramide - suspect                            problem related to partially slipped fundoplication                           - Continue  present medications.                           - Discussed in recovery - she ? if Nexium helping                            at all                           1) dysphagia 3 diet                           2) call me back in 1-2 wks with update                           3) Dc Nexium?  4) ? start metaclopramide Gatha Mayer, MD 05/03/2017 12:45:56 PM This report has been signed electronically.

## 2017-05-03 NOTE — Progress Notes (Signed)
To PACU, VSS. Report to [email or web address] 

## 2017-05-06 ENCOUNTER — Telehealth: Payer: Self-pay | Admitting: *Deleted

## 2017-05-06 NOTE — Telephone Encounter (Signed)
  Follow up Call-  Call back number 05/03/2017 01/27/2015  Post procedure Call Back phone  # [phone]  Permission to leave phone message Yes Yes  Some recent data might be hidden     Patient questions:  Do you have a fever, pain , or abdominal swelling? No. Pain Score  0 *  Have you tolerated food without any problems? Yes.    Have you been able to return to your normal activities? Yes.    Do you have any questions about your discharge instructions: Diet   No. Medications  No. Follow up visit  No.  Do you have questions or concerns about your Care? No.  Actions: * If pain score is 4 or above: No action needed, pain <4.

## 2017-06-26 ENCOUNTER — Encounter: Payer: Self-pay | Admitting: Internal Medicine

## 2017-06-28 ENCOUNTER — Telehealth: Payer: Self-pay | Admitting: Internal Medicine

## 2017-06-28 NOTE — Telephone Encounter (Signed)
Pt called stating her valsartan (DIOVAN) 320 MG tablet  Was recalled Would like something else called in  Has CPE set up for 10-24-2017 Please advise

## 2017-06-29 MED ORDER — IRBESARTAN 300 MG PO TABS: 300.0000 mg | ORAL_TABLET | Freq: Every day | ORAL | 1 refills | Status: DC

## 2017-06-29 NOTE — Telephone Encounter (Signed)
Ok to change the diovan 320 go Avapro (irbesartan) 300 mg daily - done erx

## 2017-06-30 ENCOUNTER — Other Ambulatory Visit: Payer: Self-pay | Admitting: Internal Medicine

## 2017-07-01 NOTE — Telephone Encounter (Signed)
Notified pt w/MD response below...Kylie Patrick

## 2017-07-02 ENCOUNTER — Telehealth: Payer: Self-pay | Admitting: Internal Medicine

## 2017-07-02 NOTE — Telephone Encounter (Signed)
Valsartan changed to avapro due to shortage per pharmacy/pt request

## 2017-07-04 ENCOUNTER — Ambulatory Visit (INDEPENDENT_AMBULATORY_CARE_PROVIDER_SITE_OTHER): Payer: Medicare Other | Admitting: Internal Medicine

## 2017-07-04 ENCOUNTER — Encounter: Payer: Self-pay | Admitting: Internal Medicine

## 2017-07-04 VITALS — BP 104/76 | HR 61 | Temp 98.0°F | Resp 16 | Wt 179.0 lb

## 2017-07-04 DIAGNOSIS — R21 Rash and other nonspecific skin eruption: Secondary | ICD-10-CM | POA: Insufficient documentation

## 2017-07-04 DIAGNOSIS — F411 Generalized anxiety disorder: Secondary | ICD-10-CM

## 2017-07-04 DIAGNOSIS — R739 Hyperglycemia, unspecified: Secondary | ICD-10-CM | POA: Diagnosis not present

## 2017-07-04 DIAGNOSIS — I10 Essential (primary) hypertension: Secondary | ICD-10-CM | POA: Diagnosis not present

## 2017-07-04 MED ORDER — ALPRAZOLAM 0.25 MG PO TABS: 0.2500 mg | ORAL_TABLET | Freq: Two times a day (BID) | ORAL | 0 refills | Status: DC | PRN

## 2017-07-04 MED ORDER — CLOTRIMAZOLE-BETAMETHASONE 1-0.05 % EX CREA: TOPICAL_CREAM | CUTANEOUS | 1 refills | Status: DC

## 2017-07-04 NOTE — Patient Instructions (Signed)
Please take all new medication as prescribed - the cream, and the nerve medication  Please continue all other medications as before, and refills have been done if requested.  Please have the pharmacy call with any other refills you may need.  Please keep your appointments with your specialists as you may have planned

## 2017-07-04 NOTE — Assessment & Plan Note (Signed)
stable overall by history and exam, recent data reviewed with pt, and pt to continue medical treatment as before,  to f/u any worsening symptoms or concerns BP Readings from Last 3 Encounters:  07/04/17 104/76  05/03/17 (!) 121/53  04/17/17 (!) 142/72

## 2017-07-04 NOTE — Assessment & Plan Note (Signed)
Mild, for limited short term xanax prn, declines counseling referral, to f/u any worsening symptoms or concerns

## 2017-07-04 NOTE — Assessment & Plan Note (Signed)
stable overall by history and exam, recent data reviewed with pt, and pt to continue medical treatment as before,  to f/u any worsening symptoms or concerns,  Lab Results  Component Value Date   HGBA1C 6.0 04/12/2017

## 2017-07-04 NOTE — Assessment & Plan Note (Signed)
I suspect most likley c/w fungal vs dermatitis, for lotrisone prn,  to f/u any worsening symptoms or concerns

## 2017-07-04 NOTE — Progress Notes (Signed)
Subjective:    Patient ID: Kylie Patrick, female    DOB: 19-Apr-1936, 81 y.o.   MRN: [id number]  HPI  Here with rash to left arm just distal to the elbow x 4 days, starting just distal to the elbow tip, and now seems to be spreading several cm distal, all to the posterior arm. Constant, nontender, somewhat erythem and slightly raised at the most prox and distal aspects, tan/brownish between, no ulcer, red streaks and no other UE pain, numbness or weakness.  Pt denies chest pain, increased sob or doe, wheezing, orthopnea, PND, increased LE swelling, palpitations, dizziness or syncope.   Pt denies polydipsia, polyuria.n  Denies worsening depressive symptoms, suicidal ideation, or panic; has ongoing anxiety, some increased recently due to stressors, asks for very mild prn medicaiton.   Past Medical History:  Diagnosis Date  . Anxiety   . Colon polyp 2003  . Complication of anesthesia    slow to wake up one time  . Gastroenteritis    w/ renal insufficiency in context of protracted nausea & vomitting 2006  . GERD (gastroesophageal reflux disease)   . H/O hiatal hernia   . Headache(784.0)    occasional  . Hyperlipidemia   . Hypertension    w/ LVH on ECHO  . Pacemaker 07/2016  . Tonsillitis 2006   Past Surgical History:  Procedure Laterality Date  . ABDOMINAL HYSTERECTOMY  1976   BSO for endometriosis and bengin tumor  . APPENDECTOMY  1970  . BREAST LUMPECTOMY Left 70's   X 2  . BUNIONECTOMY Bilateral   . CATARACT EXTRACTION W/PHACO Left 05/14/2013   Procedure: CATARACT EXTRACTION PHACO AND INTRAOCULAR LENS PLACEMENT (IOC);  Surgeon: Tonny Branch, MD;  Location: [facility];  Service: Ophthalmology;  Laterality: Left;  CDE:9.76  . CATARACT EXTRACTION W/PHACO Right 06/08/2013   Procedure: CATARACT EXTRACTION PHACO AND INTRAOCULAR LENS PLACEMENT (IOC);  Surgeon: Tonny Branch, MD;  Location: [facility];  Service: Ophthalmology;  Laterality: Right;  CDE 13.65  . CHOLECYSTECTOMY  1970  . COLONOSCOPY W/  POLYPECTOMY  2003   Dr [name]  . EP IMPLANTABLE DEVICE N/A 07/18/2016   Procedure: Pacemaker Implant;  Surgeon: Deboraha Sprang, MD;  Location: [facility];  Service: Cardiovascular;  Laterality: N/A;  . ESOPHAGEAL MANOMETRY N/A 10/05/2013   Procedure: ESOPHAGEAL MANOMETRY (EM);  Surgeon: Gatha Mayer, MD;  Location: [facility];  Service: Endoscopy;  Laterality: N/A;  . ESOPHAGOGASTRODUODENOSCOPY  02/05/13   Large Hiatal Hernia  . HERNIA REPAIR    . LAPAROSCOPIC NISSEN FUNDOPLICATION Bilateral 99/24/2683   Procedure: LAPAROSCOPIC NISSEN FUNDOPLICATION with hiatal hernia repair ;  Surgeon: Edward Jolly, MD;  Location: [facility];  Service: General;  Laterality: Bilateral;    reports that she has never smoked. She has never used smokeless tobacco. She reports that she drinks alcohol. She reports that she does not use drugs. family history includes Aneurysm in her paternal grandmother; Breast cancer in her maternal aunt; Coronary artery disease in her maternal grandfather and mother; Diabetes in her maternal aunt and mother; Heart attack (age of onset: 63) in her father; Heart disease in her sister; Stomach cancer in her maternal aunt; Stroke in her maternal grandmother and paternal grandfather. Allergies  Allergen Reactions  . Chlorthalidone     01/09/13 creatinine 2.2  . Hydrochlorothiazide     Hypokalemia  . Losartan Potassium-Hctz     Hypokalemia  . Norvasc [Amlodipine Besylate]     Edema  . Raloxifene  HTN  . Rofecoxib     HTN   Current Outpatient Prescriptions on File Prior to Visit  Medication Sig Dispense Refill  . aspirin 81 MG tablet Take 81 mg by mouth at bedtime.     . Calcium in Bone Mineral Cmplx 350 MG MISC Take 3 each by mouth at bedtime. Takes 1 in the morning and 2 at night    . carvedilol (COREG) 25 MG tablet TAKE 1/2 TABLET BY MOUTH TWICE A DAY 180 tablet 3  . Cholecalciferol (VITAMIN D3) 2000 UNITS TABS Take 1 tablet by mouth daily with lunch.     .  Cyanocobalamin (VITAMIN B 12 PO) Take 1 tablet by mouth daily with lunch.    . esomeprazole (NEXIUM) 40 MG capsule TAKE ONE CAPSULE BY MOUTH TWICE A DAY BEFORE MEALS 180 capsule 1  . ezetimibe-simvastatin (VYTORIN) 10-20 MG tablet TAKE 1 TABLET BY MOUTH AT BEDTIME 90 tablet 2  . folic acid (FOLVITE) 785 MCG tablet Take 400 mcg by mouth every morning.    . furosemide (LASIX) 40 MG tablet Take 1 tablet (40 mg total) by mouth every morning. **PT NEEDS PHYSICAL FOR ADDITIONAL REFILLS** 90 tablet 0  . gabapentin (NEURONTIN) 100 MG capsule Take 1 capsule (100 mg total) by mouth every 8 (eight) hours as needed (pain). 30 capsule 2  . irbesartan (AVAPRO) 300 MG tablet Take 1 tablet (300 mg total) by mouth daily. 90 tablet 1  . Magnesium 400 MG CAPS Take 400 mg by mouth at bedtime.     . Omega-3 Fatty Acids (FISH OIL) 500 MG CAPS Take 500 mg by mouth daily with lunch.      No current facility-administered medications on file prior to visit.    Review of Systems  Constitutional: Negative for other unusual diaphoresis or sweats HENT: Negative for ear discharge or swelling Eyes: Negative for other worsening visual disturbances Respiratory: Negative for stridor or other swelling  Gastrointestinal: Negative for worsening distension or other blood Genitourinary: Negative for retention or other urinary change Musculoskeletal: Negative for other MSK pain or swelling Skin: Negative for color change or other new lesions Neurological: Negative for worsening tremors and other numbness  Psychiatric/Behavioral: Negative for worsening agitation or other fatigue All other system neg per pt    Objective:   Physical Exam BP 104/76   Pulse 61   Temp 98 F (36.7 C) (Oral)   Resp 16   Wt 179 lb (81.2 kg)   SpO2 98%   BMI 34.96 kg/m  VS noted, not ill appearing, overwt Constitutional: Pt appears in NAD HENT: Head: NCAT.  Right Ear: External ear normal.  Left Ear: External ear normal.  Eyes: . Pupils are  equal, round, and reactive to light. Conjunctivae and EOM are normal Nose: without d/c or deformity Neck: Neck supple. Gross normal ROM Cardiovascular: Normal rate and regular rhythm.   Pulmonary/Chest: Effort normal and breath sounds without rales or wheezing.  Neurological: Pt is alert. At baseline orientation, motor grossly intact Skin: Skin is warm, no LE edema but left arm posteriorly just distal to the elbow has 3 cm oval area slightly raised nontender tan area with erythema edges to the most prox and most distal aspect  Psychiatric: Pt behavior is normal without agitation  No other exam findings    Assessment & Plan:

## 2017-07-14 ENCOUNTER — Other Ambulatory Visit: Payer: Self-pay | Admitting: Internal Medicine

## 2017-07-18 ENCOUNTER — Ambulatory Visit (INDEPENDENT_AMBULATORY_CARE_PROVIDER_SITE_OTHER): Payer: Medicare Other | Admitting: Internal Medicine

## 2017-07-18 ENCOUNTER — Encounter: Payer: Self-pay | Admitting: Internal Medicine

## 2017-07-18 VITALS — BP 94/76 | HR 68 | Ht 60.0 in | Wt 179.8 lb

## 2017-07-18 DIAGNOSIS — I498 Other specified cardiac arrhythmias: Secondary | ICD-10-CM

## 2017-07-18 DIAGNOSIS — Z950 Presence of cardiac pacemaker: Secondary | ICD-10-CM

## 2017-07-18 DIAGNOSIS — R001 Bradycardia, unspecified: Secondary | ICD-10-CM | POA: Diagnosis not present

## 2017-07-18 NOTE — Patient Instructions (Signed)
Medication Instructions: - Your physician recommends that you continue on your current medications as directed. Please refer to the Current Medication list given to you today.  Labwork: - none ordered  Procedures/Testing: - none ordered  Follow-Up: - Remote monitoring is used to monitor your Pacemaker of ICD from home. This monitoring reduces the number of office visits required to check your device to one time per year. It allows [place] to keep an eye on the functioning of your device to ensure it is working properly. You are scheduled for a device check from home on 10/17/17. You may send your transmission at any time that day. If you have a wireless device, the transmission will be sent automatically. After your physician reviews your transmission, you will receive a postcard with your next transmission date.  - Your physician wants you to follow-up in: 1 year with Dr. Caryl Comes. You will receive a reminder letter in the mail two months in advance. If you don't receive a letter, please call our office to schedule the follow-up appointment.   Any Additional Special Instructions Will Be Listed Below (If Applicable).     If you need a refill on your cardiac medications before your next appointment, please call your pharmacy.

## 2017-07-18 NOTE — Progress Notes (Signed)
Patient Care Team: Biagio Borg, MD as PCP - General (Internal Medicine)   HPI  Kylie Patrick is a 81 y.o. female Seen in followup for His Bundel pacemaker implanted 9/17 for symptomatic 2:1 block She had had syncope and then weakness prior to pacing  12/16 Echo  EF 55-60%  No recurrent syncope;    Date Cr K Mg  6/18  1.5 4.0           She has complaints of exercise intolerance without chest pain; sob is better   No edema  She is struggling with reflux and regurgitation of foods,  She is trying and losing weight slowly   Past Medical History:  Diagnosis Date  . Anxiety   . Colon polyp 2003  . Complication of anesthesia    slow to wake up one time  . Gastroenteritis    w/ renal insufficiency in context of protracted nausea & vomitting 2006  . GERD (gastroesophageal reflux disease)   . H/O hiatal hernia   . Headache(784.0)    occasional  . Hyperlipidemia   . Hypertension    w/ LVH on ECHO  . Pacemaker 07/2016  . Tonsillitis 2006    Past Surgical History:  Procedure Laterality Date  . ABDOMINAL HYSTERECTOMY  1976   BSO for endometriosis and bengin tumor  . APPENDECTOMY  1970  . BREAST LUMPECTOMY Left 70's   X 2  . BUNIONECTOMY Bilateral   . CATARACT EXTRACTION W/PHACO Left 05/14/2013   Procedure: CATARACT EXTRACTION PHACO AND INTRAOCULAR LENS PLACEMENT (IOC);  Surgeon: Tonny Branch, MD;  Location: [facility];  Service: Ophthalmology;  Laterality: Left;  CDE:9.76  . CATARACT EXTRACTION W/PHACO Right 06/08/2013   Procedure: CATARACT EXTRACTION PHACO AND INTRAOCULAR LENS PLACEMENT (IOC);  Surgeon: Tonny Branch, MD;  Location: [facility];  Service: Ophthalmology;  Laterality: Right;  CDE 13.65  . CHOLECYSTECTOMY  1970  . COLONOSCOPY W/ POLYPECTOMY  2003   Dr [name]  . EP IMPLANTABLE DEVICE N/A 07/18/2016   Procedure: Pacemaker Implant;  Surgeon: Deboraha Sprang, MD;  Location: [facility];  Service: Cardiovascular;  Laterality: N/A;  . ESOPHAGEAL MANOMETRY N/A  10/05/2013   Procedure: ESOPHAGEAL MANOMETRY (EM);  Surgeon: Gatha Mayer, MD;  [facility]: [facility];  Service: Endoscopy;  Laterality: N/A;  . ESOPHAGOGASTRODUODENOSCOPY  02/05/13   Large Hiatal Hernia  . HERNIA REPAIR    . LAPAROSCOPIC NISSEN FUNDOPLICATION Bilateral 22/29/7989   Procedure: LAPAROSCOPIC NISSEN FUNDOPLICATION with hiatal hernia repair ;  Surgeon: Edward Jolly, MD;  Location: [facility];  Service: General;  Laterality: Bilateral;    Current Outpatient Prescriptions  Medication Sig Dispense Refill  . ALPRAZolam (XANAX) 0.25 MG tablet Take 1 tablet (0.25 mg total) by mouth 2 (two) times daily as needed for anxiety. 30 tablet 0  . aspirin 81 MG tablet Take 81 mg by mouth at bedtime.     . Calcium in Bone Mineral Cmplx 350 MG MISC Take 3 each by mouth at bedtime. Takes 1 in the morning and 2 at night    . carvedilol (COREG) 25 MG tablet TAKE 1/2 TABLET BY MOUTH TWICE A DAY 180 tablet 3  . Cholecalciferol (VITAMIN D3) 2000 UNITS TABS Take 1 tablet by mouth daily with lunch.     . clotrimazole-betamethasone (LOTRISONE) cream Use as directed twice daily as needed to affected area 15 g 1  . Cyanocobalamin (VITAMIN B 12 PO) Take 1 tablet by mouth daily with lunch.    Marland Kitchen  esomeprazole (NEXIUM) 40 MG capsule TAKE ONE CAPSULE BY MOUTH TWICE A DAY BEFORE MEALS 180 capsule 1  . ezetimibe-simvastatin (VYTORIN) 10-20 MG tablet Take 1 tablet by mouth at bedtime. **PATIENT NEEDS PHYSICAL FOR ADDITIONAL REFILLS** 90 tablet 0  . folic acid (FOLVITE) 798 MCG tablet Take 400 mcg by mouth every morning.    . furosemide (LASIX) 40 MG tablet Take 1 tablet (40 mg total) by mouth every morning. **PT NEEDS PHYSICAL FOR ADDITIONAL REFILLS** 90 tablet 0  . gabapentin (NEURONTIN) 100 MG capsule Take 1 capsule (100 mg total) by mouth every 8 (eight) hours as needed (pain). 30 capsule 2  . irbesartan (AVAPRO) 300 MG tablet Take 1 tablet (300 mg total) by mouth daily. 90 tablet 1  . Magnesium 400 MG CAPS  Take 400 mg by mouth at bedtime.     . Omega-3 Fatty Acids (FISH OIL) 500 MG CAPS Take 500 mg by mouth daily with lunch.      No current facility-administered medications for this visit.     Allergies  Allergen Reactions  . Chlorthalidone     01/09/13 creatinine 2.2  . Hydrochlorothiazide     Hypokalemia  . Losartan Potassium-Hctz     Hypokalemia  . Norvasc [Amlodipine Besylate]     Edema  . Raloxifene     HTN  . Rofecoxib     HTN      Review of Systems negative except from HPI and PMH  Physical Exam BP 94/76   Pulse 68   Ht 5' (1.524 m)   Wt 179 lb 12.8 oz (81.6 kg)   SpO2 99%   BMI 35.11 kg/m  Well developed and nourished in no acute distress HENT normal Neck supple with JVP-flat Carotids brisk and full without bruits Clear Regular rate and rhythm, no murmurs or gallops Abd-soft with active BS without hepatomegaly No Clubbing cyanosis edema Skin-warm and dry A & Oriented  Grossly normal sensory and motor function  ECG  P-synchronous/ AV  pacing with QRS 120 msec and what appears to be fusion   Assessment and  Plan  2:1 AVB  Pacer- His [employer]onic The patient's device was interrogated and the information was fully reviewed.  The device was reprogrammed to maximize longevity and prevent auto titration of output   Syncope    Exercise intolerance  Stable cardiac status  No evidence of fluid overload-- I dont see a great heart therapeutic target, although HFpEf is always a likely issue in older women  For now will encourage her to continue in her efforts to lose weight which should be abetted by her GI difficulties, the silver lining in the cloud   No further syncope     Current medicines are reviewed at length with the patient today .  The patient does not  have concerns regarding medicines.

## 2017-08-01 LAB — CUP PACEART INCLINIC DEVICE CHECK
Battery Remaining Longevity: 93 mo
Battery Voltage: 3.01 V
Brady Statistic AP VP Percent: 11.98 %
Brady Statistic RA Percent Paced: 11.97 %
Date Time Interrogation Session: 20180906181217
Implantable Lead Implant Date: 20170906
Implantable Lead Location: 753859
Implantable Pulse Generator Implant Date: 20170906
Lead Channel Impedance Value: 418 Ohm
Lead Channel Impedance Value: 437 Ohm
Lead Channel Pacing Threshold Amplitude: 0.5 V
Lead Channel Pacing Threshold Amplitude: 1.5 V
Lead Channel Pacing Threshold Pulse Width: 0.4 ms
Lead Channel Sensing Intrinsic Amplitude: 2.25 mV
Lead Channel Setting Pacing Amplitude: 2 V
Lead Channel Setting Pacing Pulse Width: 1 ms
MDC IDC LEAD IMPLANT DT: 20170906
MDC IDC LEAD LOCATION: 753860
MDC IDC MSMT LEADCHNL RA IMPEDANCE VALUE: 570 Ohm
MDC IDC MSMT LEADCHNL RV IMPEDANCE VALUE: 323 Ohm
MDC IDC MSMT LEADCHNL RV PACING THRESHOLD PULSEWIDTH: 1 ms
MDC IDC MSMT LEADCHNL RV SENSING INTR AMPL: 5.125 mV
MDC IDC SET LEADCHNL RV PACING AMPLITUDE: 2.5 V
MDC IDC SET LEADCHNL RV SENSING SENSITIVITY: 0.9 mV
MDC IDC STAT BRADY AP VS PERCENT: 0 %
MDC IDC STAT BRADY AS VP PERCENT: 88.01 %
MDC IDC STAT BRADY AS VS PERCENT: 0.01 %
MDC IDC STAT BRADY RV PERCENT PACED: 99.91 %

## 2017-09-02 IMAGING — DX DG CHEST 1V PORT
1 series · 1 of 1 positions shown · non-contrast
Comparison: 10/10/2015, 10/19/2013.

CLINICAL DATA: Three-day history of shortness of breath with
exertion. Current history of hypertension.

EXAM:
PORTABLE CHEST 1 VIEW

[chest ap]
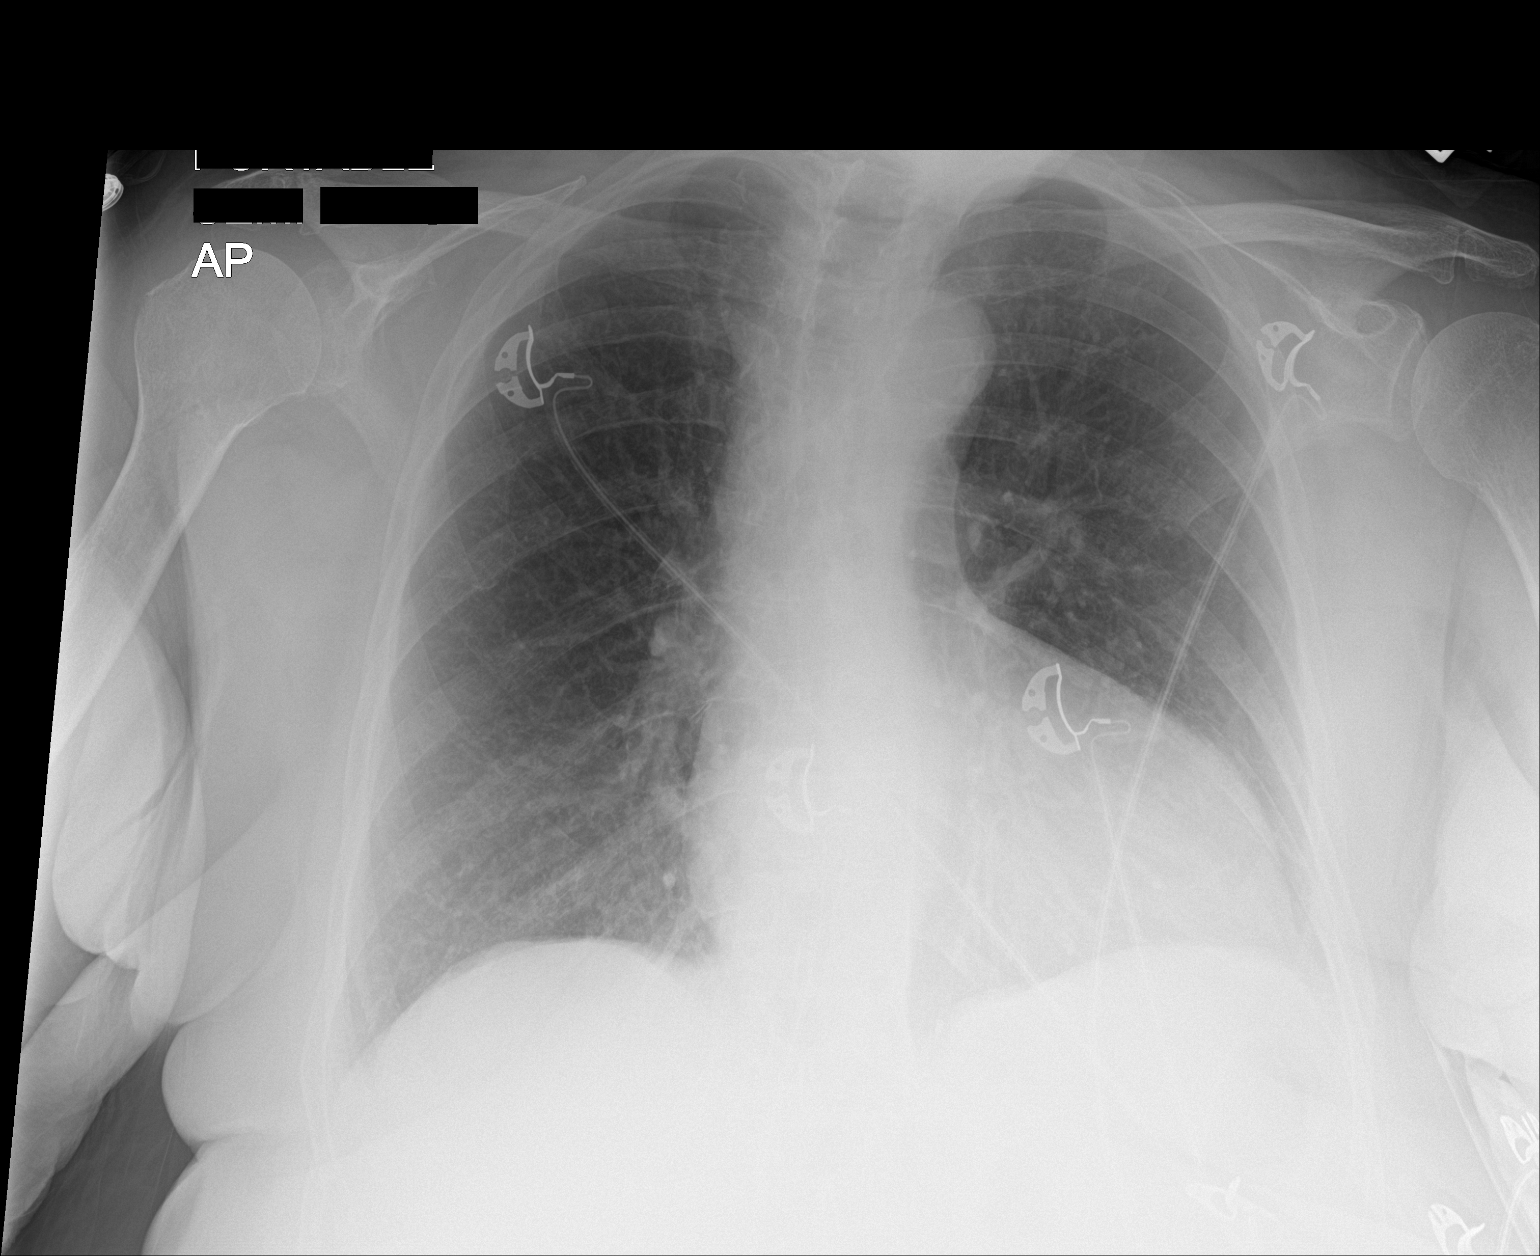

[1 of 1 positions shown; findings below may reference images not displayed]

FINDINGS: Cardiac silhouette mildly enlarged, unchanged. Thoracic aorta mildly
tortuous and atherosclerotic, unchanged. Hilar and mediastinal
contours otherwise unremarkable. Lungs clear. Bronchovascular
markings normal. Pulmonary vascularity normal. No visible pleural
effusions. No pneumothorax.
IMPRESSION: 1.  No acute cardiopulmonary disease.
2. Stable mild cardiomegaly without pulmonary edema.
3. Mild thoracic aortic atherosclerosis.

## 2017-09-04 IMAGING — CR DG CHEST 2V
2 series · 2 of 2 positions shown · non-contrast
Comparison: Portable chest x-ray July 17, 2016

CLINICAL DATA: Status post pacemaker placement

EXAM:
CHEST  2 VIEW

[chest lat]
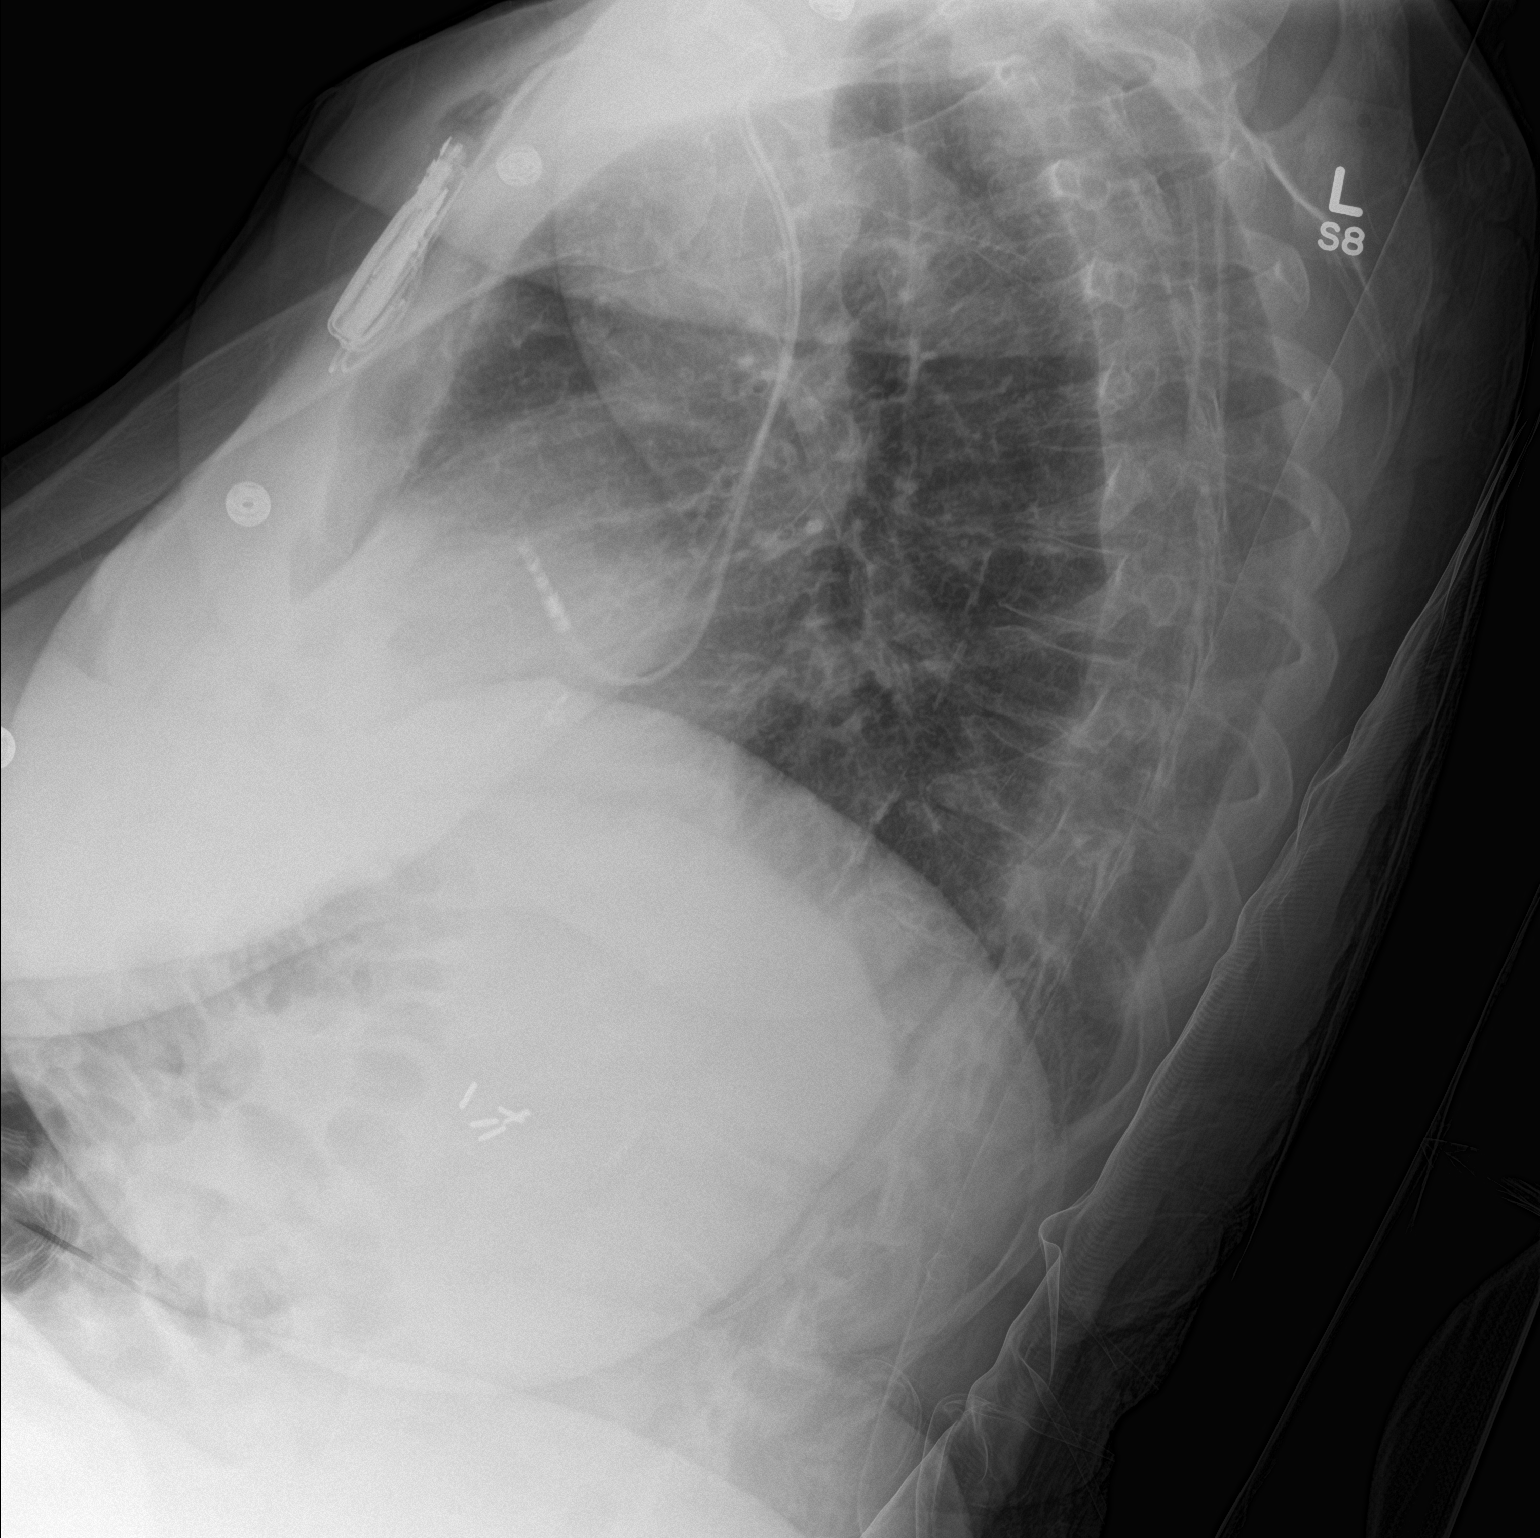

[chest ap]
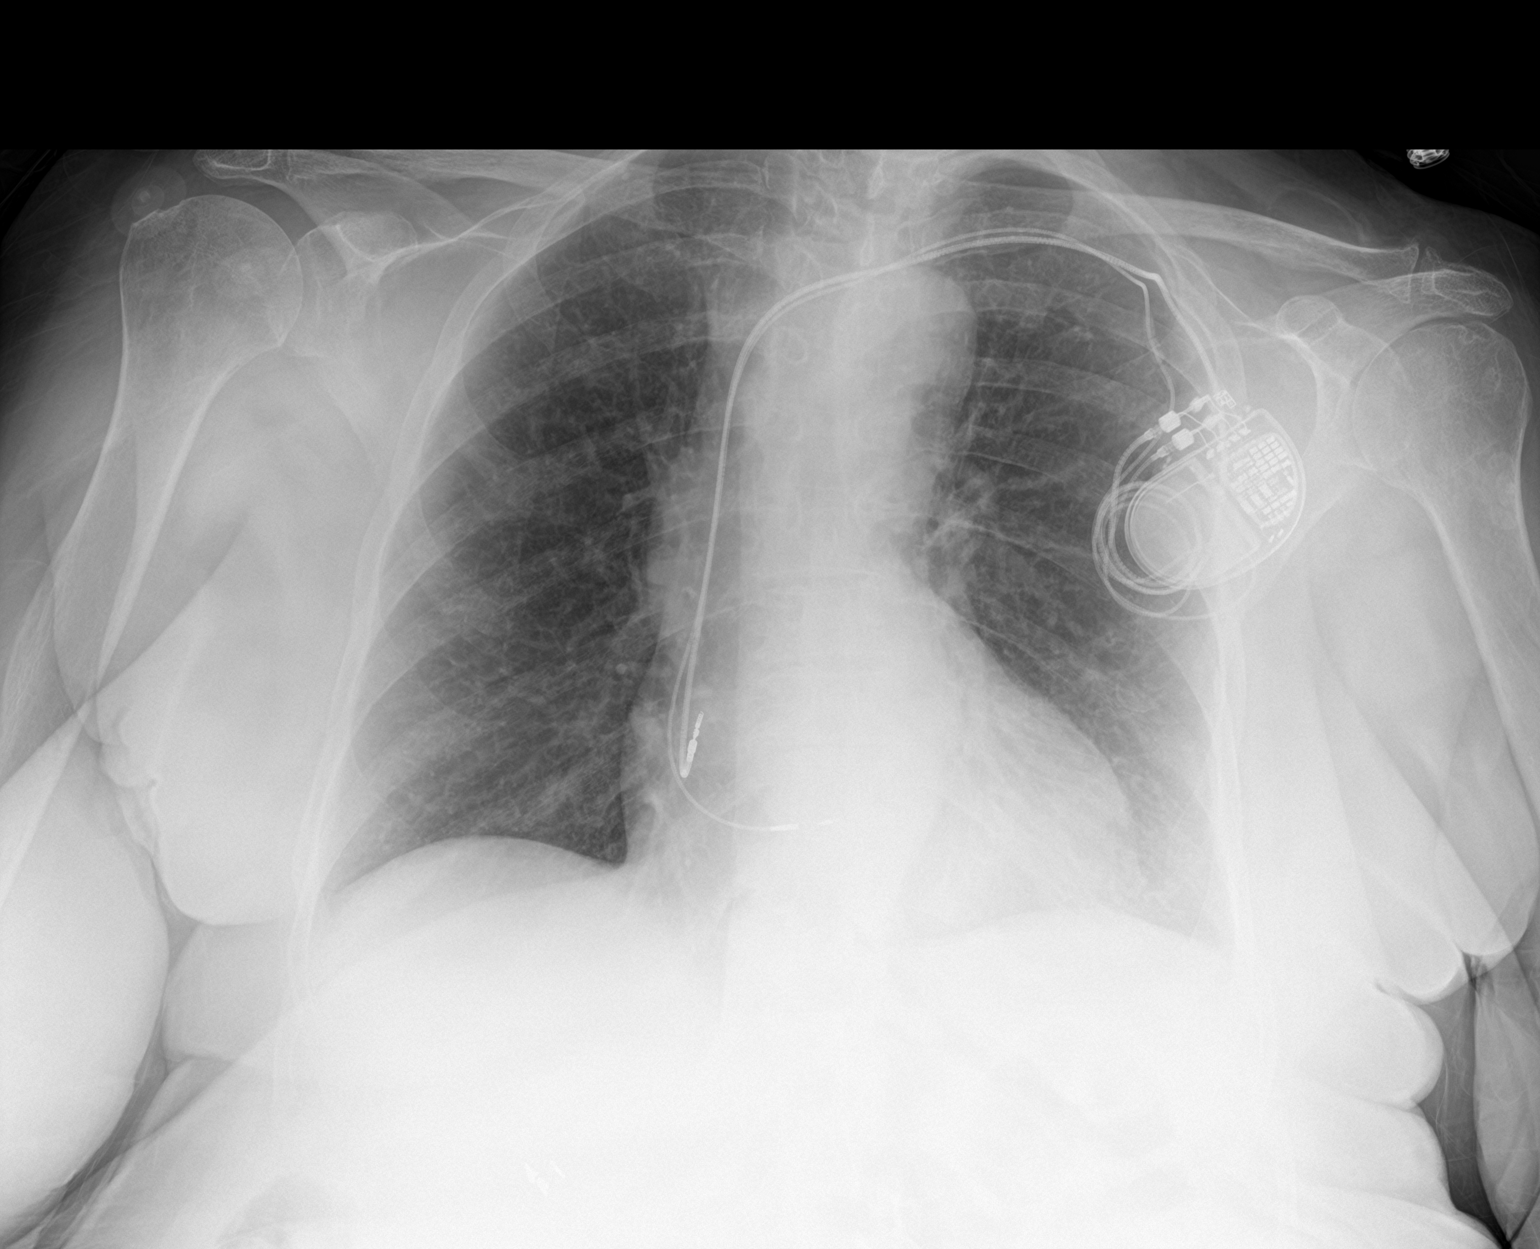

[2 of 2 positions shown; findings below may reference images not displayed]

FINDINGS: The patient has undergone a dual lead pacemaker placement.
Radiographic positioning of the electrodes is reasonable. The jet
generator overlies the left pectoral region. There is no
postprocedure pneumothorax or hemo thorax. The heart is top-normal
in size. The pulmonary vascularity is not engorged. There is
calcification in the wall of the thoracic aorta. The bony thorax
exhibits no acute abnormality.
IMPRESSION: There is no postprocedure complication following permanent pacemaker
placement.

## 2017-10-12 ENCOUNTER — Other Ambulatory Visit: Payer: Self-pay | Admitting: Internal Medicine

## 2017-10-14 ENCOUNTER — Ambulatory Visit (INDEPENDENT_AMBULATORY_CARE_PROVIDER_SITE_OTHER): Payer: Medicare Other

## 2017-10-14 ENCOUNTER — Encounter: Payer: Self-pay | Admitting: Podiatry

## 2017-10-14 ENCOUNTER — Ambulatory Visit: Payer: Medicare Other | Admitting: Podiatry

## 2017-10-14 VITALS — BP 106/76 | HR 66 | Resp 16

## 2017-10-14 DIAGNOSIS — L84 Corns and callosities: Secondary | ICD-10-CM

## 2017-10-14 DIAGNOSIS — M2041 Other hammer toe(s) (acquired), right foot: Secondary | ICD-10-CM

## 2017-10-14 DIAGNOSIS — M779 Enthesopathy, unspecified: Secondary | ICD-10-CM

## 2017-10-14 MED ORDER — TRIAMCINOLONE ACETONIDE 10 MG/ML IJ SUSP
10.0000 mg | Freq: Once | INTRAMUSCULAR | Status: AC
  Administered 2017-10-14: 10 mg

## 2017-10-14 NOTE — Progress Notes (Signed)
Subjective:   Patient ID: Kylie Patrick, female   DOB: 81 y.o.   MRN: [id number]   HPI Patient presents with chronic lesion between the fourth and fifth toes right foot with fluid buildup and states this is been going on for a number of years tried wider shoes trimming and padding techniques without relief of symptoms   Review of Systems  All other systems reviewed and are negative.       Objective:  Physical Exam  Constitutional: She appears well-developed and well-nourished.  Cardiovascular: Intact distal pulses.  Pulmonary/Chest: Effort normal.  Musculoskeletal: Normal range of motion.  Neurological: She is alert.  Skin: Skin is warm.  Nursing note and vitals reviewed.   Neurovascular status was found to be intact muscle strength was adequate range of motion within normal limits.  Patient was found to have exquisite discomfort fourth interspace right foot with keratotic lesion formation chronic inflammation of the area with fluid buildup noted around the fourth metatarsal phalangeal joint.  Patient is found to have no smoking at the current time and is found to be active but is not able to be as active as she wants due to pain     Assessment:  Chronic interspace lesion fourth digit right with keratotic lesion about the fifth and fourth toes with fluid buildup around the metatarsal phalangeal joint     Plan:  H&P condition and x-ray reviewed with patient.  Due to long-standing nature I recommended correction with patient requiring a partial syndactylization of the digits along with arthroplasty.  She cannot get this done until the end of January and at this point I did do a careful capsular injection of the fourth interspace and fourth MPJ with 2 mg dexamethasone Kenalog 5 mg Xylocaine and I then debrided lesion fully to give relief.  Patient was allowed to re-consent form understanding all possible complications as listed on the consent form and the fact there is no guarantee as  far success of surgery.  Patient wants surgery scheduled for outpatient surgery understanding all complications as listed and is encouraged to call with questions.  Scheduled for outpatient surgery.  X-rays indicate enlargement had a proximal phalanx fifth digit right pressing against the fourth toe with hallux limitus deformity that is nonsymptomatic

## 2017-10-14 NOTE — Progress Notes (Signed)
   Subjective:    Patient ID: Kylie Patrick, female    DOB: 1936-01-12, 81 y.o.   MRN: [id number]  HPI    Review of Systems  All other systems reviewed and are negative.      Objective:   Physical Exam        Assessment & Plan:

## 2017-10-14 NOTE — Patient Instructions (Signed)
Pre-Operative Instructions  Congratulations, you have decided to take an important step towards improving your quality of life.  You can be assured that the doctors and staff at [facility] will be with you every step of the way.  Here are some important things you should know:  1. Plan to be at the surgery center/hospital at least 1 (one) hour prior to your scheduled time, unless otherwise directed by the surgical center/hospital staff.  You must have a responsible adult accompany you, remain during the surgery and drive you home.  Make sure you have directions to the surgical center/hospital to ensure you arrive on time. 2. If you are having surgery at [facility] or [facility] hospitals, you will need a copy of your medical history and physical form from your family physician within one month prior to the date of surgery. We will give you a form for your primary physician to complete.  3. We make every effort to accommodate the date you request for surgery.  However, there are times where surgery dates or times have to be moved.  We will contact you as soon as possible if a change in schedule is required.   4. No aspirin/ibuprofen for one week before surgery.  If you are on aspirin, any non-steroidal anti-inflammatory medications (Mobic, Aleve, Ibuprofen) should not be taken seven (7) days prior to your surgery.  You make take Tylenol for pain prior to surgery.  5. Medications - If you are taking daily heart and blood pressure medications, seizure, reflux, allergy, asthma, anxiety, pain or diabetes medications, make sure you notify the surgery center/hospital before the day of surgery so they can tell you which medications you should take or avoid the day of surgery. 6. No food or drink after midnight the night before surgery unless directed otherwise by surgical center/hospital staff. 7. No alcoholic beverages 24-hours prior to surgery.  No smoking 24-hours prior or 24-hours after  surgery. 8. Wear loose pants or shorts. They should be loose enough to fit over bandages, boots, and casts. 9. Don't wear slip-on shoes. Sneakers are preferred. 10. Bring your boot with you to the surgery center/hospital.  Also bring crutches or a walker if your physician has prescribed it for you.  If you do not have this equipment, it will be provided for you after surgery. 11. If you have not been contacted by the surgery center/hospital by the day before your surgery, call to confirm the date and time of your surgery. 12. Leave-time from work may vary depending on the type of surgery you have.  Appropriate arrangements should be made prior to surgery with your employer. 13. Prescriptions will be provided immediately following surgery by your doctor.  Fill these as soon as possible after surgery and take the medication as directed. Pain medications will not be refilled on weekends and must be approved by the doctor. 14. Remove nail polish on the operative foot and avoid getting pedicures prior to surgery. 15. Wash the night before surgery.  The night before surgery wash the foot and leg well with water and the antibacterial soap provided. Be sure to pay special attention to beneath the toenails and in between the toes.  Wash for at least three (3) minutes. Rinse thoroughly with water and dry well with a towel.  Perform this wash unless told not to do so by your physician.  Enclosed: 1 Ice pack (please put in freezer the night before surgery)   1 Hibiclens skin cleaner     Pre-op instructions  If you have any questions regarding the instructions, please do not hesitate to call our office.  [place]: [address], [place], [place] [address] -- [phone]  [place]: [address] [place], [place] [address] -- [phone]  [place]: [address]  [place], [place] [address] -- [phone]  [place]: [address], [address], [place], [place] [address] -- [phone]  Website:  [email or web address] 

## 2017-10-15 ENCOUNTER — Telehealth: Payer: Self-pay | Admitting: *Deleted

## 2017-10-15 NOTE — Telephone Encounter (Signed)
"  I scheduled surgery yesterday.  I want to know how much my insurance will cover.  I have [place]."  [facility] normally covers at 80% and you are responsible for 20%.  "Okay thank you, that's all I needed to know."

## 2017-10-17 ENCOUNTER — Ambulatory Visit (INDEPENDENT_AMBULATORY_CARE_PROVIDER_SITE_OTHER): Payer: Medicare Other | Admitting: *Deleted

## 2017-10-17 DIAGNOSIS — R001 Bradycardia, unspecified: Secondary | ICD-10-CM

## 2017-10-17 NOTE — Progress Notes (Signed)
Remote pacemaker transmission.   

## 2017-10-18 ENCOUNTER — Other Ambulatory Visit: Payer: Self-pay | Admitting: Internal Medicine

## 2017-10-22 LAB — CUP PACEART REMOTE DEVICE CHECK
Battery Voltage: 3 V
Brady Statistic AP VP Percent: 19.39 %
Brady Statistic AS VP Percent: 80.59 %
Brady Statistic AS VS Percent: 0.02 %
Brady Statistic RV Percent Paced: 99.91 %
Implantable Lead Implant Date: 20170906
Implantable Lead Location: 753860
Implantable Lead Model: 5076
Implantable Pulse Generator Implant Date: 20170906
Lead Channel Impedance Value: 323 Ohm
Lead Channel Impedance Value: 418 Ohm
Lead Channel Impedance Value: 475 Ohm
Lead Channel Impedance Value: 570 Ohm
Lead Channel Pacing Threshold Amplitude: 0.625 V
Lead Channel Pacing Threshold Amplitude: 1.625 V
Lead Channel Sensing Intrinsic Amplitude: 6.125 mV
Lead Channel Setting Pacing Amplitude: 2 V
Lead Channel Setting Pacing Amplitude: 2.5 V
Lead Channel Setting Pacing Pulse Width: 1 ms
Lead Channel Setting Sensing Sensitivity: 0.9 mV
MDC IDC LEAD IMPLANT DT: 20170906
MDC IDC LEAD LOCATION: 753859
MDC IDC MSMT BATTERY REMAINING LONGEVITY: 84 mo
MDC IDC MSMT LEADCHNL RA PACING THRESHOLD PULSEWIDTH: 0.4 ms
MDC IDC MSMT LEADCHNL RA SENSING INTR AMPL: 2.5 mV
MDC IDC MSMT LEADCHNL RA SENSING INTR AMPL: 2.5 mV
MDC IDC MSMT LEADCHNL RV PACING THRESHOLD PULSEWIDTH: 0.4 ms
MDC IDC MSMT LEADCHNL RV SENSING INTR AMPL: 6.125 mV
MDC IDC SESS DTM: 20181206151647
MDC IDC STAT BRADY AP VS PERCENT: 0 %
MDC IDC STAT BRADY RA PERCENT PACED: 19.38 %

## 2017-10-24 ENCOUNTER — Encounter: Payer: Self-pay | Admitting: Internal Medicine

## 2017-10-24 ENCOUNTER — Ambulatory Visit (INDEPENDENT_AMBULATORY_CARE_PROVIDER_SITE_OTHER): Payer: Medicare Other | Admitting: Internal Medicine

## 2017-10-24 ENCOUNTER — Other Ambulatory Visit (INDEPENDENT_AMBULATORY_CARE_PROVIDER_SITE_OTHER): Payer: Medicare Other

## 2017-10-24 VITALS — BP 120/86 | HR 69 | Temp 97.9°F | Ht 60.0 in | Wt 172.0 lb

## 2017-10-24 DIAGNOSIS — E782 Mixed hyperlipidemia: Secondary | ICD-10-CM

## 2017-10-24 DIAGNOSIS — Z0000 Encounter for general adult medical examination without abnormal findings: Secondary | ICD-10-CM | POA: Diagnosis not present

## 2017-10-24 DIAGNOSIS — E2839 Other primary ovarian failure: Secondary | ICD-10-CM | POA: Diagnosis not present

## 2017-10-24 DIAGNOSIS — R739 Hyperglycemia, unspecified: Secondary | ICD-10-CM

## 2017-10-24 DIAGNOSIS — N183 Chronic kidney disease, stage 3 unspecified: Secondary | ICD-10-CM

## 2017-10-24 DIAGNOSIS — Z23 Encounter for immunization: Secondary | ICD-10-CM

## 2017-10-24 DIAGNOSIS — I10 Essential (primary) hypertension: Secondary | ICD-10-CM

## 2017-10-24 LAB — LIPID PANEL
CHOL/HDL RATIO: 3
Cholesterol: 156 mg/dL (ref 0–200)
HDL: 53.3 mg/dL (ref >39.00)
LDL CALC: 79 mg/dL (ref 0–99)
NONHDL: 102.91
TRIGLYCERIDES: 122 mg/dL (ref 0.0–149.0)
VLDL: 24.4 mg/dL (ref 0.0–40.0)

## 2017-10-24 LAB — URINALYSIS, ROUTINE W REFLEX MICROSCOPIC
BILIRUBIN URINE: NEGATIVE
HGB URINE DIPSTICK: NEGATIVE
KETONES UR: NEGATIVE
Nitrite: NEGATIVE
Specific Gravity, Urine: 1.02 (ref 1.000–1.030)
Total Protein, Urine: NEGATIVE
URINE GLUCOSE: NEGATIVE
Urobilinogen, UA: 0.2 (ref 0.0–1.0)
pH: 5.5 (ref 5.0–8.0)

## 2017-10-24 LAB — CBC WITH DIFFERENTIAL/PLATELET
BASOS ABS: 0.1 10*3/uL (ref 0.0–0.1)
Basophils Relative: 0.9 % (ref 0.0–3.0)
Eosinophils Absolute: 0.2 10*3/uL (ref 0.0–0.7)
Eosinophils Relative: 2 % (ref 0.0–5.0)
HCT: 43.7 % (ref 36.0–46.0)
Hemoglobin: 14.3 g/dL (ref 12.0–15.0)
LYMPHS ABS: 2.2 10*3/uL (ref 0.7–4.0)
Lymphocytes Relative: 29.1 % (ref 12.0–46.0)
MCHC: 32.8 g/dL (ref 30.0–36.0)
MCV: 90.1 fl (ref 78.0–100.0)
MONO ABS: 0.8 10*3/uL (ref 0.1–1.0)
MONOS PCT: 9.8 % (ref 3.0–12.0)
NEUTROS ABS: 4.4 10*3/uL (ref 1.4–7.7)
NEUTROS PCT: 58.2 % (ref 43.0–77.0)
PLATELETS: 230 10*3/uL (ref 150.0–400.0)
RBC: 4.85 Mil/uL (ref 3.87–5.11)
RDW: 14.9 % (ref 11.5–15.5)
WBC: 7.6 10*3/uL (ref 4.0–10.5)

## 2017-10-24 LAB — BASIC METABOLIC PANEL
BUN: 25 mg/dL — ABNORMAL HIGH (ref 6–23)
CALCIUM: 9.6 mg/dL (ref 8.4–10.5)
CHLORIDE: 104 meq/L (ref 96–112)
CO2: 31 meq/L (ref 19–32)
CREATININE: 1.47 mg/dL — AB (ref 0.40–1.20)
GFR: 36.4 mL/min — ABNORMAL LOW (ref >60.00)
Glucose, Bld: 100 mg/dL — ABNORMAL HIGH (ref 70–99)
Potassium: 4.1 mEq/L (ref 3.5–5.1)
Sodium: 143 mEq/L (ref 135–145)

## 2017-10-24 LAB — HEPATIC FUNCTION PANEL
ALK PHOS: 53 U/L (ref 39–117)
ALT: 16 U/L (ref 0–35)
AST: 19 U/L (ref 0–37)
Albumin: 4.2 g/dL (ref 3.5–5.2)
BILIRUBIN DIRECT: 0.1 mg/dL (ref 0.0–0.3)
BILIRUBIN TOTAL: 0.7 mg/dL (ref 0.2–1.2)
Total Protein: 7.1 g/dL (ref 6.0–8.3)

## 2017-10-24 LAB — HEMOGLOBIN A1C: HEMOGLOBIN A1C: 6 % (ref 4.6–6.5)

## 2017-10-24 LAB — TSH: TSH: 1.52 u[IU]/mL (ref 0.35–4.50)

## 2017-10-24 MED ORDER — ZOSTER VAC RECOMB ADJUVANTED 50 MCG/0.5ML IM SUSR: 0.5000 mL | Freq: Once | INTRAMUSCULAR | 1 refills | Status: AC

## 2017-10-24 NOTE — Assessment & Plan Note (Signed)

## 2017-10-24 NOTE — Patient Instructions (Addendum)
You had the flu shot today  Please schedule the bone density test before leaving today at the scheduling desk (where you check out)  Your shingles shot prescription was sent to the pharmacy  Please continue all other medications as before, and refills have been done if requested.  Please have the pharmacy call with any other refills you may need.  Please continue your efforts at being more active, low cholesterol diet, and weight control.  You are otherwise up to date with prevention measures today.  Please keep your appointments with your specialists as you may have planned  Please go to the LAB in the Basement (turn left off the elevator) for the tests to be done today  You will be contacted by phone if any changes need to be made immediately.  Otherwise, you will receive a letter about your results with an explanation, but please check with MyChart first.  Please remember to sign up for MyChart if you have not done so, as this will be important to you in the future with finding out test results, communicating by private email, and scheduling acute appointments online when needed.  Please return in 6 months, or sooner if needed, with Lab testing done 3-5 days before

## 2017-10-24 NOTE — Assessment & Plan Note (Signed)
stable overall by history and exam, recent data reviewed with pt, and pt to continue medical treatment as before,  to f/u any worsening symptoms or concerns Lab Results  Component Value Date   LDLCALC 75 04/12/2017

## 2017-10-24 NOTE — Assessment & Plan Note (Signed)
Asympt, stable overall by history and exam, recent data reviewed with pt, and pt to continue medical treatment as before,  to f/u any worsening symptoms or concerns 

## 2017-10-24 NOTE — Assessment & Plan Note (Signed)
stable overall by history and exam, recent data reviewed with pt, and pt to continue medical treatment as before,  to f/u any worsening symptoms or concerns, for f/u lab, also seen renal due about feb 2019

## 2017-10-24 NOTE — Assessment & Plan Note (Signed)
stable overall by history and exam, recent data reviewed with pt, and pt to continue medical treatment as before,  to f/u any worsening symptoms or concerns BP Readings from Last 3 Encounters:  10/24/17 120/86  10/14/17 106/76  07/18/17 94/76

## 2017-10-24 NOTE — Progress Notes (Signed)
Subjective:    Patient ID: Kylie Patrick, female    DOB: 07/29/1936, 81 y.o.   MRN: [id number]  HPI  Here for wellness and f/u;  Overall doing ok;  Pt denies Chest pain, worsening SOB, DOE, wheezing, orthopnea, PND, worsening LE edema, palpitations, dizziness or syncope.  Pt denies neurological change such as new headache, facial or extremity weakness.  Pt denies polydipsia, polyuria, or low sugar symptoms. Pt states overall good compliance with treatment and medications, good tolerability, and has been trying to follow appropriate diet.  Pt denies worsening depressive symptoms, suicidal ideation or panic. No fever, night sweats, wt loss, loss of appetite, or other constitutional symptoms.  Pt states good ability with ADL's, has low fall risk, home safety reviewed and adequate, no other significant changes in hearing or vision, and occasionally active with exercise only.  Rash from last visit resolved to left arm, only has few bruises to arms only.  Did see podiatry for toe pain where two bone surface rub, and offered surgury but she has deferred so far, may back out of surgury now scheduled jan 22.  Due in about feb 2019 for renal f/u visit.  Has lost several lbs recently but thinks appetite ok BP Readings from Last 3 Encounters:  10/24/17 120/86  10/14/17 106/76  07/18/17 94/76   Wt Readings from Last 3 Encounters:  10/24/17 172 lb (78 kg)  07/18/17 179 lb 12.8 oz (81.6 kg)  07/04/17 179 lb (81.2 kg)   Past Medical History:  Diagnosis Date  . Anxiety   . Colon polyp 2003  . Complication of anesthesia    slow to wake up one time  . Gastroenteritis    w/ renal insufficiency in context of protracted nausea & vomitting 2006  . GERD (gastroesophageal reflux disease)   . H/O hiatal hernia   . Headache(784.0)    occasional  . Hyperlipidemia   . Hypertension    w/ LVH on ECHO  . Pacemaker 07/2016  . Tonsillitis 2006   Past Surgical History:  Procedure Laterality Date  . ABDOMINAL  HYSTERECTOMY  1976   BSO for endometriosis and bengin tumor  . APPENDECTOMY  1970  . BREAST LUMPECTOMY Left 70's   X 2  . BUNIONECTOMY Bilateral   . CATARACT EXTRACTION W/PHACO Left 05/14/2013   Procedure: CATARACT EXTRACTION PHACO AND INTRAOCULAR LENS PLACEMENT (IOC);  Surgeon: Tonny Branch, MD;  Location: [facility];  Service: Ophthalmology;  Laterality: Left;  CDE:9.76  . CATARACT EXTRACTION W/PHACO Right 06/08/2013   Procedure: CATARACT EXTRACTION PHACO AND INTRAOCULAR LENS PLACEMENT (IOC);  Surgeon: Tonny Branch, MD;  Location: [facility];  Service: Ophthalmology;  Laterality: Right;  CDE 13.65  . CHOLECYSTECTOMY  1970  . COLONOSCOPY W/ POLYPECTOMY  2003   Dr [name]  . EP IMPLANTABLE DEVICE N/A 07/18/2016   Procedure: Pacemaker Implant;  Surgeon: Deboraha Sprang, MD;  Location: [place];  Service: Cardiovascular;  Laterality: N/A;  . ESOPHAGEAL MANOMETRY N/A 10/05/2013   Procedure: ESOPHAGEAL MANOMETRY (EM);  Surgeon: Gatha Mayer, MD;  Location: [facility];  Service: Endoscopy;  Laterality: N/A;  . ESOPHAGOGASTRODUODENOSCOPY  02/05/13   Large Hiatal Hernia  . HERNIA REPAIR    . LAPAROSCOPIC NISSEN FUNDOPLICATION Bilateral 84/69/6295   Procedure: LAPAROSCOPIC NISSEN FUNDOPLICATION with hiatal hernia repair ;  Surgeon: Edward Jolly, MD;  Location: [facility];  Service: General;  Laterality: Bilateral;    reports that  has never smoked. she has never used smokeless tobacco. She  reports that she drinks alcohol. She reports that she does not use drugs. family history includes Aneurysm in her paternal grandmother; Breast cancer in her maternal aunt; Coronary artery disease in her maternal grandfather and mother; Diabetes in her maternal aunt and mother; Heart attack (age of onset: 31) in her father; Heart disease in her sister; Stomach cancer in her maternal aunt; Stroke in her maternal grandmother and paternal grandfather. Allergies  Allergen Reactions  . Chlorthalidone     01/09/13  creatinine 2.2  . Hydrochlorothiazide     Hypokalemia  . Losartan Potassium-Hctz     Hypokalemia  . Norvasc [Amlodipine Besylate]     Edema  . Raloxifene     HTN  . Rofecoxib     HTN   Current Outpatient Medications on File Prior to Visit  Medication Sig Dispense Refill  . ALPRAZolam (XANAX) 0.25 MG tablet Take 1 tablet (0.25 mg total) by mouth 2 (two) times daily as needed for anxiety. 30 tablet 0  . aspirin 81 MG tablet Take 81 mg by mouth at bedtime.     . Calcium in Bone Mineral Cmplx 350 MG MISC Take 3 each by mouth at bedtime. Takes 1 in the morning and 2 at night    . carvedilol (COREG) 25 MG tablet TAKE 1/2 TABLET BY MOUTH TWICE A DAY 180 tablet 3  . Cholecalciferol (VITAMIN D3) 2000 UNITS TABS Take 1 tablet by mouth daily with lunch.     . clotrimazole-betamethasone (LOTRISONE) cream Use as directed twice daily as needed to affected area 15 g 1  . Cyanocobalamin (VITAMIN B 12 PO) Take 1 tablet by mouth daily with lunch.    . esomeprazole (NEXIUM) 40 MG capsule TAKE ONE CAPSULE BY MOUTH TWICE A DAY BEFORE MEALS 180 capsule 1  . ezetimibe-simvastatin (VYTORIN) 10-20 MG tablet TAKE 1 TABLET BY MOUTH AT BEDTIME. **PATIENT NEEDS PHYSICAL FOR ADDITIONAL REFILLS** 90 tablet 0  . folic acid (FOLVITE) 263 MCG tablet Take 400 mcg by mouth every morning.    . furosemide (LASIX) 40 MG tablet Take 1 tablet (40 mg total) by mouth every morning. **PT NEEDS PHYSICAL FOR ADDITIONAL REFILLS** 90 tablet 0  . gabapentin (NEURONTIN) 100 MG capsule Take 1 capsule (100 mg total) by mouth every 8 (eight) hours as needed (pain). 30 capsule 2  . irbesartan (AVAPRO) 300 MG tablet Take 1 tablet (300 mg total) by mouth daily. 90 tablet 1  . Magnesium 400 MG CAPS Take 400 mg by mouth at bedtime.     . Omega-3 Fatty Acids (FISH OIL) 500 MG CAPS Take 500 mg by mouth daily with lunch.      No current facility-administered medications on file prior to visit.    Review of Systems Constitutional: Negative for  other unusual diaphoresis, sweats, appetite or weight changes HENT: Negative for other worsening hearing loss, ear pain, facial swelling, mouth sores or neck stiffness.   Eyes: Negative for other worsening pain, redness or other visual disturbance.  Respiratory: Negative for other stridor or swelling Cardiovascular: Negative for other palpitations or other chest pain  Gastrointestinal: Negative for worsening diarrhea or loose stools, blood in stool, distention or other pain Genitourinary: Negative for hematuria, flank pain or other change in urine volume.  Musculoskeletal: Negative for myalgias or other joint swelling.  Skin: Negative for other color change, or other wound or worsening drainage.  Neurological: Negative for other syncope or numbness. Hematological: Negative for other adenopathy or swelling Psychiatric/Behavioral: Negative for hallucinations, other worsening agitation,  SI, self-injury, or new decreased concentration All other system neg  Per pt    Objective:   Physical Exam BP 120/86   Pulse 69   Temp 97.9 F (36.6 C) (Oral)   Ht 5' (1.524 m)   Wt 172 lb (78 kg)   SpO2 99%   BMI 33.59 kg/m  VS noted,  Constitutional: Pt is oriented to person, place, and time. Appears well-developed and well-nourished, in no significant distress and comfortable Head: Normocephalic and atraumatic  Eyes: Conjunctivae and EOM are normal. Pupils are equal, round, and reactive to light Right Ear: External ear normal without discharge Left Ear: External ear normal without discharge Nose: Nose without discharge or deformity Mouth/Throat: Oropharynx is without other ulcerations and moist  Neck: Normal range of motion. Neck supple. No JVD present. No tracheal deviation present or significant neck LA or mass Cardiovascular: Normal rate, regular rhythm, normal heart sounds and intact distal pulses.   Pulmonary/Chest: WOB normal and breath sounds without rales or wheezing  Abdominal: Soft. Bowel  sounds are normal. NT. No HSM  Musculoskeletal: Normal range of motion. Exhibits no edema Lymphadenopathy: Has no other cervical adenopathy.  Neurological: Pt is alert and oriented to person, place, and time. Pt has normal reflexes. No cranial nerve deficit. Motor grossly intact, Gait intact Skin: Skin is warm and dry. No rash noted or new ulcerations Psychiatric:  Has normal mood and affect. Behavior is normal without agitation No other exam findings Lab Results  Component Value Date   WBC 6.0 10/10/2016   HGB 13.2 10/10/2016   HCT 39.8 10/10/2016   PLT 219.0 10/10/2016   GLUCOSE 112 (H) 04/12/2017   CHOL 150 04/12/2017   TRIG 106.0 04/12/2017   HDL 53.50 04/12/2017   LDLCALC 75 04/12/2017   ALT 20 04/12/2017   AST 22 04/12/2017   NA 143 04/12/2017   K 4.0 04/12/2017   CL 105 04/12/2017   CREATININE 1.50 (H) 04/12/2017   BUN 28 (H) 04/12/2017   CO2 31 04/12/2017   TSH 1.47 10/10/2016   HGBA1C 6.0 04/12/2017   MICROALBUR 2.1 (H) 10/02/2010       Assessment & Plan:

## 2017-10-25 ENCOUNTER — Encounter: Payer: Self-pay | Admitting: Cardiology

## 2017-10-30 ENCOUNTER — Other Ambulatory Visit: Payer: Medicare Other

## 2017-12-03 ENCOUNTER — Encounter: Payer: Self-pay | Admitting: Podiatry

## 2017-12-03 DIAGNOSIS — M2041 Other hammer toe(s) (acquired), right foot: Secondary | ICD-10-CM | POA: Diagnosis not present

## 2017-12-03 DIAGNOSIS — D492 Neoplasm of unspecified behavior of bone, soft tissue, and skin: Secondary | ICD-10-CM | POA: Diagnosis not present

## 2017-12-09 ENCOUNTER — Other Ambulatory Visit: Payer: Medicare Other

## 2017-12-11 ENCOUNTER — Ambulatory Visit (INDEPENDENT_AMBULATORY_CARE_PROVIDER_SITE_OTHER): Payer: Medicare Other | Admitting: Podiatry

## 2017-12-11 ENCOUNTER — Encounter: Payer: Self-pay | Admitting: Podiatry

## 2017-12-11 ENCOUNTER — Ambulatory Visit (INDEPENDENT_AMBULATORY_CARE_PROVIDER_SITE_OTHER): Payer: Medicare Other

## 2017-12-11 VITALS — BP 110/80 | HR 86 | Temp 97.0°F

## 2017-12-11 DIAGNOSIS — M2041 Other hammer toe(s) (acquired), right foot: Secondary | ICD-10-CM | POA: Diagnosis not present

## 2017-12-11 NOTE — Progress Notes (Signed)
Subjective:   Patient ID: Kylie Patrick, female   DOB: 82 y.o.   MRN: [id number]   HPI Patient states doing really well with surgery with minimal discomfort currently noted   ROS      Objective:  Physical Exam  Neurovascular status intact with wound edges well coapted fourth interspace right     Assessment:  Doing well post partial syndactylization fourth interspace right     Plan:  Applied sterile dressing instructed on wider type immobilization and reappoint for [place] to recheck in 2 weeks for stitch removal or earlier if needed  X-rays indicate the incision site is healing very well with wound edges well coapted and satisfactory resection of head of proximal phalanx fifth digit right

## 2017-12-16 NOTE — Progress Notes (Signed)
DOS 12/03/17 Hammer toe repair w/ removal bone 5th Rt, Webbing procedure 4th Rt

## 2017-12-17 ENCOUNTER — Other Ambulatory Visit: Payer: Self-pay | Admitting: Internal Medicine

## 2017-12-25 ENCOUNTER — Ambulatory Visit (INDEPENDENT_AMBULATORY_CARE_PROVIDER_SITE_OTHER): Payer: Medicare Other | Admitting: Podiatry

## 2017-12-25 ENCOUNTER — Ambulatory Visit (INDEPENDENT_AMBULATORY_CARE_PROVIDER_SITE_OTHER): Payer: Medicare Other

## 2017-12-25 ENCOUNTER — Encounter: Payer: Self-pay | Admitting: Podiatry

## 2017-12-25 DIAGNOSIS — M2041 Other hammer toe(s) (acquired), right foot: Secondary | ICD-10-CM

## 2017-12-28 NOTE — Progress Notes (Signed)
Subjective:   Patient ID: Kylie Patrick, female   DOB: 82 y.o.   MRN: [id number]   HPI Patient's right foot healing well with no drainage wound edges well coapted and fourth and fifth digits in good alignment   ROS      Objective:  Physical Exam  Neurovascular status intact with wound edge of the interspace fourth digit in good alignment with wound edges well coapted     Assessment:  Doing very well post partial syndactylization arthroplasty right foot     Plan:  Stitches removed wound edges coapted well applied sterile dressing instructed on sterile dressing for the next several weeks and reappoint to recheck  Satisfactory resection of bone noted with good alignment of the digits

## 2018-01-07 ENCOUNTER — Other Ambulatory Visit: Payer: Self-pay

## 2018-01-07 MED ORDER — EZETIMIBE-SIMVASTATIN 10-20 MG PO TABS: 1.0000 | ORAL_TABLET | Freq: Every day | ORAL | 3 refills | Status: DC

## 2018-01-16 ENCOUNTER — Ambulatory Visit (INDEPENDENT_AMBULATORY_CARE_PROVIDER_SITE_OTHER): Payer: Medicare Other | Admitting: *Deleted

## 2018-01-16 ENCOUNTER — Telehealth: Payer: Self-pay | Admitting: Cardiology

## 2018-01-16 DIAGNOSIS — R001 Bradycardia, unspecified: Secondary | ICD-10-CM | POA: Diagnosis not present

## 2018-01-16 NOTE — Telephone Encounter (Signed)
Spoke with pt and reminded pt of remote transmission that is due today. Pt verbalized understanding.   

## 2018-01-17 ENCOUNTER — Encounter: Payer: Self-pay | Admitting: Cardiology

## 2018-01-17 NOTE — Progress Notes (Signed)
Remote pacemaker transmission.   

## 2018-01-25 LAB — CUP PACEART REMOTE DEVICE CHECK
Battery Remaining Longevity: 81 mo
Battery Voltage: 3 V
Brady Statistic RV Percent Paced: 99.99 %
Date Time Interrogation Session: 20190307225558
Implantable Lead Implant Date: 20170906
Implantable Lead Implant Date: 20170906
Implantable Lead Location: 753860
Implantable Lead Model: 3830
Implantable Pulse Generator Implant Date: 20170906
Lead Channel Impedance Value: 323 Ohm
Lead Channel Impedance Value: 399 Ohm
Lead Channel Impedance Value: 456 Ohm
Lead Channel Pacing Threshold Amplitude: 0.625 V
Lead Channel Sensing Intrinsic Amplitude: 2.375 mV
Lead Channel Setting Pacing Amplitude: 2 V
Lead Channel Setting Pacing Amplitude: 2.5 V
Lead Channel Setting Sensing Sensitivity: 0.9 mV
MDC IDC LEAD LOCATION: 753859
MDC IDC MSMT LEADCHNL RA IMPEDANCE VALUE: 570 Ohm
MDC IDC MSMT LEADCHNL RA PACING THRESHOLD PULSEWIDTH: 0.4 ms
MDC IDC MSMT LEADCHNL RA SENSING INTR AMPL: 2.375 mV
MDC IDC MSMT LEADCHNL RV PACING THRESHOLD AMPLITUDE: 1.625 V
MDC IDC MSMT LEADCHNL RV PACING THRESHOLD PULSEWIDTH: 0.4 ms
MDC IDC MSMT LEADCHNL RV SENSING INTR AMPL: 2.5 mV
MDC IDC MSMT LEADCHNL RV SENSING INTR AMPL: 2.5 mV
MDC IDC SET LEADCHNL RV PACING PULSEWIDTH: 1 ms
MDC IDC STAT BRADY AP VP PERCENT: 15.28 %
MDC IDC STAT BRADY AP VS PERCENT: 0 %
MDC IDC STAT BRADY AS VP PERCENT: 84.71 %
MDC IDC STAT BRADY AS VS PERCENT: 0 %
MDC IDC STAT BRADY RA PERCENT PACED: 15.27 %

## 2018-02-13 ENCOUNTER — Other Ambulatory Visit: Payer: Self-pay | Admitting: Internal Medicine

## 2018-03-11 ENCOUNTER — Other Ambulatory Visit: Payer: Self-pay | Admitting: Internal Medicine

## 2018-04-17 ENCOUNTER — Ambulatory Visit (INDEPENDENT_AMBULATORY_CARE_PROVIDER_SITE_OTHER): Payer: Medicare Other | Admitting: *Deleted

## 2018-04-17 DIAGNOSIS — R001 Bradycardia, unspecified: Secondary | ICD-10-CM

## 2018-04-17 DIAGNOSIS — I428 Other cardiomyopathies: Secondary | ICD-10-CM

## 2018-04-17 NOTE — Progress Notes (Signed)
Remote ICD transmission.   

## 2018-04-20 ENCOUNTER — Other Ambulatory Visit: Payer: Self-pay | Admitting: Internal Medicine

## 2018-04-24 ENCOUNTER — Ambulatory Visit: Payer: Medicare Other | Admitting: Internal Medicine

## 2018-04-24 ENCOUNTER — Encounter: Payer: Self-pay | Admitting: Internal Medicine

## 2018-04-24 VITALS — BP 122/86 | HR 77 | Temp 97.8°F | Ht 60.0 in | Wt 179.0 lb

## 2018-04-24 DIAGNOSIS — R739 Hyperglycemia, unspecified: Secondary | ICD-10-CM | POA: Diagnosis not present

## 2018-04-24 DIAGNOSIS — Z0000 Encounter for general adult medical examination without abnormal findings: Secondary | ICD-10-CM | POA: Diagnosis not present

## 2018-04-24 MED ORDER — ALPRAZOLAM 0.25 MG PO TABS: 0.2500 mg | ORAL_TABLET | Freq: Two times a day (BID) | ORAL | 2 refills | Status: DC | PRN

## 2018-04-24 NOTE — Progress Notes (Signed)
Subjective:    Patient ID: Kylie Patrick, female    DOB: 27-Mar-1936, 82 y.o.   MRN: [id number]  HPI  Here for wellness and f/u;  Overall doing ok;  Pt denies Chest pain, worsening SOB, DOE, wheezing, orthopnea, PND, worsening LE edema, palpitations, dizziness or syncope.  Pt denies neurological change such as new headache, facial or extremity weakness.  Pt denies polydipsia, polyuria, or low sugar symptoms. Pt states overall good compliance with treatment and medications, good tolerability, and has been trying to follow appropriate diet.  Pt denies worsening depressive symptoms, suicidal ideation or panic. No fever, night sweats, wt loss, loss of appetite, or other constitutional symptoms.  Pt states good ability with ADL's, has low fall risk, home safety reviewed and adequate, no other significant changes in hearing or vision, and only occasionally active with exercise.  Has seen cardiology and renal recenlty with labs forwarded here. No new complaints Past Medical History:  Diagnosis Date  . Anxiety   . Colon polyp 2003  . Complication of anesthesia    slow to wake up one time  . Gastroenteritis    w/ renal insufficiency in context of protracted nausea & vomitting 2006  . GERD (gastroesophageal reflux disease)   . H/O hiatal hernia   . Headache(784.0)    occasional  . Hyperlipidemia   . Hypertension    w/ LVH on ECHO  . Pacemaker 07/2016  . Tonsillitis 2006   Past Surgical History:  Procedure Laterality Date  . ABDOMINAL HYSTERECTOMY  1976   BSO for endometriosis and bengin tumor  . APPENDECTOMY  1970  . BREAST LUMPECTOMY Left 70's   X 2  . BUNIONECTOMY Bilateral   . CATARACT EXTRACTION W/PHACO Left 05/14/2013   Procedure: CATARACT EXTRACTION PHACO AND INTRAOCULAR LENS PLACEMENT (IOC);  Surgeon: Tonny Branch, MD;  Location: [facility];  Service: Ophthalmology;  Laterality: Left;  CDE:9.76  . CATARACT EXTRACTION W/PHACO Right 06/08/2013   Procedure: CATARACT EXTRACTION PHACO AND  INTRAOCULAR LENS PLACEMENT (IOC);  Surgeon: Tonny Branch, MD;  Location: [facility];  Service: Ophthalmology;  Laterality: Right;  CDE 13.65  . CHOLECYSTECTOMY  1970  . COLONOSCOPY W/ POLYPECTOMY  2003   Dr [name]  . EP IMPLANTABLE DEVICE N/A 07/18/2016   Procedure: Pacemaker Implant;  Surgeon: Deboraha Sprang, MD;  Location: [facility];  Service: Cardiovascular;  Laterality: N/A;  . ESOPHAGEAL MANOMETRY N/A 10/05/2013   Procedure: ESOPHAGEAL MANOMETRY (EM);  Surgeon: Gatha Mayer, MD;  Location: [facility];  Service: Endoscopy;  Laterality: N/A;  . ESOPHAGOGASTRODUODENOSCOPY  02/05/13   Large Hiatal Hernia  . HERNIA REPAIR    . LAPAROSCOPIC NISSEN FUNDOPLICATION Bilateral 73/41/9379   Procedure: LAPAROSCOPIC NISSEN FUNDOPLICATION with hiatal hernia repair ;  Surgeon: Edward Jolly, MD;  Location: [facility];  Service: General;  Laterality: Bilateral;    reports that she has never smoked. She has never used smokeless tobacco. She reports that she drinks alcohol. She reports that she does not use drugs. family history includes Aneurysm in her paternal grandmother; Breast cancer in her maternal aunt; Coronary artery disease in her maternal grandfather and mother; Diabetes in her maternal aunt and mother; Heart attack (age of onset: 3) in her father; Heart disease in her sister; Stomach cancer in her maternal aunt; Stroke in her maternal grandmother and paternal grandfather. Allergies  Allergen Reactions  . Chlorthalidone     01/09/13 creatinine 2.2  . Hydrochlorothiazide     Hypokalemia  . Losartan Potassium-Hctz  Hypokalemia  . Norvasc [Amlodipine Besylate]     Edema  . Raloxifene     HTN  . Rofecoxib     HTN   . Current Outpatient Medications on File Prior to Visit  Medication Sig Dispense Refill  . aspirin 81 MG tablet Take 81 mg by mouth at bedtime.     . Calcium in Bone Mineral Cmplx 350 MG MISC Take 3 each by mouth at bedtime. Takes 1 in the morning and 2 at night    .  carvedilol (COREG) 25 MG tablet TAKE 1/2 TABLET BY MOUTH TWICE A DAY 180 tablet 1  . Cholecalciferol (VITAMIN D3) 2000 UNITS TABS Take 1 tablet by mouth daily with lunch.     . clotrimazole-betamethasone (LOTRISONE) cream Use as directed twice daily as needed to affected area 15 g 1  . Cyanocobalamin (VITAMIN B 12 PO) Take 1 tablet by mouth daily with lunch.    . esomeprazole (NEXIUM) 40 MG capsule TAKE ONE CAPSULE BY MOUTH TWICE A DAY BEFORE MEALS 180 capsule 0  . ezetimibe-simvastatin (VYTORIN) 10-20 MG tablet Take 1 tablet by mouth at bedtime. 90 tablet 3  . folic acid (FOLVITE) 829 MCG tablet Take 400 mcg by mouth every morning.    . furosemide (LASIX) 40 MG tablet Take 1 tablet (40 mg total) by mouth daily. 90 tablet 2  . gabapentin (NEURONTIN) 100 MG capsule Take 1 capsule (100 mg total) by mouth every 8 (eight) hours as needed (pain). 30 capsule 2  . HYDROcodone-acetaminophen (NORCO/VICODIN) 5-325 MG tablet Take 1 tablet by mouth every 6 (six) hours as needed for moderate pain. Dispensed 15 with no refills on 12/03/17    . irbesartan (AVAPRO) 300 MG tablet TAKE 1 TABLET BY MOUTH EVERY DAY 90 tablet 3  . Magnesium 400 MG CAPS Take 400 mg by mouth at bedtime.     . Omega-3 Fatty Acids (FISH OIL) 500 MG CAPS Take 500 mg by mouth daily with lunch.      No current facility-administered medications on file prior to visit.    Review of Systems Constitutional: Negative for other unusual diaphoresis, sweats, appetite or weight changes HENT: Negative for other worsening hearing loss, ear pain, facial swelling, mouth sores or neck stiffness.   Eyes: Negative for other worsening pain, redness or other visual disturbance.  Respiratory: Negative for other stridor or swelling Cardiovascular: Negative for other palpitations or other chest pain  Gastrointestinal: Negative for worsening diarrhea or loose stools, blood in stool, distention or other pain Genitourinary: Negative for hematuria, flank pain or  other change in urine volume.  Musculoskeletal: Negative for myalgias or other joint swelling.  Skin: Negative for other color change, or other wound or worsening drainage.  Neurological: Negative for other syncope or numbness. Hematological: Negative for other adenopathy or swelling Psychiatric/Behavioral: Negative for hallucinations, other worsening agitation, SI, self-injury, or new decreased concentration All other system neg per pt    Objective:   Physical Exam BP 122/86   Pulse 77   Temp 97.8 F (36.6 C) (Oral)   Ht 5' (1.524 m)   Wt 179 lb (81.2 kg)   SpO2 97%   BMI 34.96 kg/m  VS noted,  Constitutional: Pt is oriented to person, place, and time. Appears well-developed and well-nourished, in no significant distress and comfortable Head: Normocephalic and atraumatic  Eyes: Conjunctivae and EOM are normal. Pupils are equal, round, and reactive to light Right Ear: External ear normal without discharge Left Ear: External ear normal without  discharge Nose: Nose without discharge or deformity Mouth/Throat: Oropharynx is without other ulcerations and moist  Neck: Normal range of motion. Neck supple. No JVD present. No tracheal deviation present or significant neck LA or mass Cardiovascular: Normal rate, regular rhythm, normal heart sounds and intact distal pulses.   Pulmonary/Chest: WOB normal and breath sounds without rales or wheezing  Abdominal: Soft. Bowel sounds are normal. NT. No HSM  Musculoskeletal: Normal range of motion. Exhibits no edema Lymphadenopathy: Has no other cervical adenopathy.  Neurological: Pt is alert and oriented to person, place, and time. Pt has normal reflexes. No cranial nerve deficit. Motor grossly intact, Gait intact Skin: Skin is warm and dry. No rash noted or new ulcerations Psychiatric:  Has normal mood and affect. Behavior is normal without agitation No other exam findings Lab Results  Component Value Date   WBC 7.6 10/24/2017   HGB 14.3  10/24/2017   HCT 43.7 10/24/2017   PLT 230.0 10/24/2017   GLUCOSE 100 (H) 10/24/2017   CHOL 156 10/24/2017   TRIG 122.0 10/24/2017   HDL 53.30 10/24/2017   LDLCALC 79 10/24/2017   ALT 16 10/24/2017   AST 19 10/24/2017   NA 143 10/24/2017   K 4.1 10/24/2017   CL 104 10/24/2017   CREATININE 1.47 (H) 10/24/2017   BUN 25 (H) 10/24/2017   CO2 31 10/24/2017   TSH 1.52 10/24/2017   HGBA1C 6.0 10/24/2017   MICROALBUR 2.1 (H) 10/02/2010   Reviewed may 29 labs per renal; declines further labs today       Assessment & Plan:

## 2018-04-24 NOTE — Patient Instructions (Signed)
Please continue all other medications as before, and refills have been done if requested.  Please have the pharmacy call with any other refills you may need.  Please continue your efforts at being more active, low cholesterol diet, and weight control.  You are otherwise up to date with prevention measures today.  Please keep your appointments with your specialists as you may have planned  Please return in 1 year for your yearly visit, or sooner if needed, with Lab testing done 3-5 days before  

## 2018-04-24 NOTE — Assessment & Plan Note (Signed)

## 2018-04-24 NOTE — Assessment & Plan Note (Signed)
stable overall by history and exam, recent data reviewed with pt, and pt to continue medical treatment as before,  to f/u any worsening symptoms or concerns Lab Results  Component Value Date   HGBA1C 6.0 10/24/2017

## 2018-04-29 ENCOUNTER — Telehealth: Payer: Self-pay | Admitting: Internal Medicine

## 2018-04-29 MED ORDER — IRBESARTAN 300 MG PO TABS: 300.0000 mg | ORAL_TABLET | Freq: Every day | ORAL | 3 refills | Status: DC

## 2018-04-29 NOTE — Telephone Encounter (Signed)
LOV  04/24/18 Dr. Jenny Reichmann See request.

## 2018-04-29 NOTE — Telephone Encounter (Signed)
Copied from [place] [phone]. Topic: Quick Communication - See Telephone Encounter >> Apr 29, 2018  4:59 PM Nils Flack wrote: CRM for notification. See Telephone encounter for: 04/29/18. [facility] about irbesartan (AVAPRO) 300 MG tablet - the 300 mg is on back order. They are asking to switch to the 150 mg double qty  Cb is [phone]

## 2018-04-29 NOTE — Telephone Encounter (Signed)
Copied from [place] [id number]. Topic: Quick Communication - Rx Refill/Question >> Apr 29, 2018  7:35 AM Kylie Patrick wrote: Medication: irbesartan (AVAPRO) 300 MG tablet  Has the patient contacted their pharmacy? Yes.   (Agent: If no, request that the patient contact the pharmacy for the refill.) (Agent: If yes, when and what did the pharmacy advise?)  Preferred Pharmacy (with phone number [facility] street name): [facility] [address]  Med is on on back order from manufacturer and pt needs something else called  in.  She has not had med in a week. She was waiting on pharm to contact her  Cb for pt is [phone]  Agent: Please be advised that RX refills may take up to 3 business days. We ask that you follow-up with your pharmacy.

## 2018-04-30 ENCOUNTER — Telehealth: Payer: Self-pay | Admitting: Internal Medicine

## 2018-04-30 MED ORDER — IRBESARTAN 150 MG PO TABS: 300.0000 mg | ORAL_TABLET | Freq: Every day | ORAL | 3 refills | Status: DC

## 2018-04-30 NOTE — Telephone Encounter (Signed)
Copied from [place] [phone]. Topic: Quick Communication - See Telephone Encounter >> Apr 30, 2018 11:07 AM Rutherford Nail, NT wrote: CRM for notification. See Telephone encounter for: 04/30/18. Christy from [facility] calling and is requesting an alternate medication for the irbesartan (AVAPRO) 300 MG tablet due to it being on back order. Requesting substitute be irbesartan 150mg . Is this okay? Please advise. CB#: [phone]

## 2018-04-30 NOTE — Telephone Encounter (Signed)
Already done

## 2018-04-30 NOTE — Telephone Encounter (Signed)
Ok, this is done 

## 2018-05-15 ENCOUNTER — Other Ambulatory Visit: Payer: Self-pay | Admitting: Internal Medicine

## 2018-05-21 ENCOUNTER — Other Ambulatory Visit: Payer: Self-pay | Admitting: Internal Medicine

## 2018-05-21 NOTE — Telephone Encounter (Signed)
Pharmacy comment: Product Backordered/Unavailable:ALL STRENGTHS OF IRBESARTAN ON BACK ORDER WE HAVE LOSARTAN AND VALSA RTAN AVAILABLE, PLEASE SWITCH AN ALTERNATIVE, THANKS

## 2018-05-29 ENCOUNTER — Other Ambulatory Visit: Payer: Self-pay | Admitting: Internal Medicine

## 2018-05-29 DIAGNOSIS — I10 Essential (primary) hypertension: Secondary | ICD-10-CM

## 2018-05-29 MED ORDER — LOSARTAN POTASSIUM 100 MG PO TABS: 100.0000 mg | ORAL_TABLET | Freq: Every day | ORAL | 0 refills | Status: DC

## 2018-05-29 NOTE — Addendum Note (Signed)
Addended by: Juliet Rude on: 05/29/2018 09:06 AM   Modules accepted: Orders

## 2018-05-29 NOTE — Telephone Encounter (Signed)
Pt calling stating that the pharmacy never received the substitute for her blood pressure medicine  pt states that she is out of medicine since yesterday

## 2018-05-29 NOTE — Telephone Encounter (Signed)
Losartan RX is messed up, please advise and resend

## 2018-05-29 NOTE — Telephone Encounter (Addendum)
Dr.Jones please advise. It looks like PCP sent the script in on 05/21/18 however it was no sig with a quantity of 1. Please advise and resend. Thank you!

## 2018-06-04 LAB — CUP PACEART REMOTE DEVICE CHECK
Battery Remaining Longevity: 79 mo
Battery Voltage: 3 V
Brady Statistic AP VP Percent: 16.37 %
Brady Statistic AP VS Percent: 0 %
Brady Statistic AS VP Percent: 83.63 %
Brady Statistic AS VS Percent: 0.01 %
Brady Statistic RV Percent Paced: 99.99 %
Date Time Interrogation Session: 20190606131056
Implantable Lead Implant Date: 20170906
Implantable Lead Location: 753859
Implantable Lead Location: 753860
Implantable Lead Model: 3830
Implantable Lead Model: 5076
Implantable Pulse Generator Implant Date: 20170906
Lead Channel Impedance Value: 323 Ohm
Lead Channel Impedance Value: 399 Ohm
Lead Channel Impedance Value: 456 Ohm
Lead Channel Impedance Value: 570 Ohm
Lead Channel Pacing Threshold Amplitude: 0.625 V
Lead Channel Pacing Threshold Amplitude: 1.75 V
Lead Channel Pacing Threshold Pulse Width: 0.4 ms
Lead Channel Sensing Intrinsic Amplitude: 2.375 mV
Lead Channel Sensing Intrinsic Amplitude: 2.375 mV
Lead Channel Sensing Intrinsic Amplitude: 6 mV
Lead Channel Sensing Intrinsic Amplitude: 6 mV
Lead Channel Setting Pacing Amplitude: 2 V
Lead Channel Setting Pacing Pulse Width: 1 ms
MDC IDC LEAD IMPLANT DT: 20170906
MDC IDC MSMT LEADCHNL RV PACING THRESHOLD PULSEWIDTH: 0.4 ms
MDC IDC SET LEADCHNL RV PACING AMPLITUDE: 2.5 V
MDC IDC SET LEADCHNL RV SENSING SENSITIVITY: 0.9 mV
MDC IDC STAT BRADY RA PERCENT PACED: 16.36 %

## 2018-07-17 ENCOUNTER — Telehealth: Payer: Self-pay | Admitting: Cardiology

## 2018-07-17 ENCOUNTER — Ambulatory Visit (INDEPENDENT_AMBULATORY_CARE_PROVIDER_SITE_OTHER): Payer: Medicare Other | Admitting: *Deleted

## 2018-07-17 DIAGNOSIS — I442 Atrioventricular block, complete: Secondary | ICD-10-CM

## 2018-07-17 NOTE — Telephone Encounter (Signed)
Spoke with pt and reminded pt of remote transmission that is due today. Pt verbalized understanding.   

## 2018-07-18 NOTE — Progress Notes (Signed)
Remote pacemaker transmission.   

## 2018-07-22 ENCOUNTER — Encounter: Payer: Self-pay | Admitting: Internal Medicine

## 2018-07-22 ENCOUNTER — Ambulatory Visit: Payer: Medicare Other | Admitting: Internal Medicine

## 2018-07-22 VITALS — BP 112/70 | HR 75 | Ht 60.0 in | Wt 174.6 lb

## 2018-07-22 DIAGNOSIS — I442 Atrioventricular block, complete: Secondary | ICD-10-CM | POA: Diagnosis not present

## 2018-07-22 DIAGNOSIS — R55 Syncope and collapse: Secondary | ICD-10-CM

## 2018-07-22 DIAGNOSIS — Z950 Presence of cardiac pacemaker: Secondary | ICD-10-CM | POA: Diagnosis not present

## 2018-07-22 LAB — CUP PACEART INCLINIC DEVICE CHECK
Battery Voltage: 3 V
Brady Statistic AP VP Percent: 18.03 %
Brady Statistic AP VS Percent: 0 %
Brady Statistic AS VP Percent: 81.96 %
Brady Statistic AS VS Percent: 0.01 %
Brady Statistic RV Percent Paced: 99.97 %
Implantable Lead Implant Date: 20170906
Implantable Lead Implant Date: 20170906
Implantable Lead Location: 753860
Implantable Lead Model: 3830
Implantable Lead Model: 5076
Implantable Pulse Generator Implant Date: 20170906
Lead Channel Impedance Value: 304 Ohm
Lead Channel Impedance Value: 437 Ohm
Lead Channel Impedance Value: 551 Ohm
Lead Channel Pacing Threshold Amplitude: 0.5 V
Lead Channel Pacing Threshold Amplitude: 1.75 V
Lead Channel Pacing Threshold Pulse Width: 0.4 ms
Lead Channel Sensing Intrinsic Amplitude: 2.125 mV
Lead Channel Sensing Intrinsic Amplitude: 5.875 mV
Lead Channel Sensing Intrinsic Amplitude: 5.875 mV
Lead Channel Setting Pacing Amplitude: 2 V
Lead Channel Setting Pacing Amplitude: 2.5 V
Lead Channel Setting Pacing Pulse Width: 1 ms
MDC IDC LEAD LOCATION: 753859
MDC IDC MSMT BATTERY REMAINING LONGEVITY: 76 mo
MDC IDC MSMT LEADCHNL RA PACING THRESHOLD PULSEWIDTH: 0.4 ms
MDC IDC MSMT LEADCHNL RA SENSING INTR AMPL: 2.375 mV
MDC IDC MSMT LEADCHNL RV IMPEDANCE VALUE: 399 Ohm
MDC IDC SESS DTM: 20190910152214
MDC IDC SET LEADCHNL RV SENSING SENSITIVITY: 0.9 mV
MDC IDC STAT BRADY RA PERCENT PACED: 18.02 %

## 2018-07-22 NOTE — Progress Notes (Signed)
Patient Care Team: Biagio Borg, MD as PCP - General (Internal Medicine)   HPI  Kylie Patrick is a 82 y.o. female Seen in followup for His [employer]el pacemaker implanted 9/17 for symptomatic 2:1 block She had had syncope and then weakness prior to pacing  12/16 Echo  EF 55-60%  No recurrent syncope;    Date Cr K Mg  6/18  1.5 4.0           The patient denies chest pain nocturnal dyspnea, orthopnea or peripheral edema.  There have been no palpitations, lightheadedness or syncope.  She is having some fatigue and chronic shortness of breath.  She have to stop on a flight of stairs.  Unchanged.  Weight increased Past Medical History:  Diagnosis Date  . Anxiety   . Colon polyp 2003  . Complication of anesthesia    slow to wake up one time  . Gastroenteritis    w/ renal insufficiency in context of protracted nausea & vomitting 2006  . GERD (gastroesophageal reflux disease)   . H/O hiatal hernia   . Headache(784.0)    occasional  . Hyperlipidemia   . Hypertension    w/ LVH on ECHO  . Pacemaker 07/2016  . Tonsillitis 2006    Past Surgical History:  Procedure Laterality Date  . ABDOMINAL HYSTERECTOMY  1976   BSO for endometriosis and bengin tumor  . APPENDECTOMY  1970  . BREAST LUMPECTOMY Left 70's   X 2  . BUNIONECTOMY Bilateral   . CATARACT EXTRACTION W/PHACO Left 05/14/2013   Procedure: CATARACT EXTRACTION PHACO AND INTRAOCULAR LENS PLACEMENT (IOC);  Surgeon: Tonny Branch, MD;  Location: [facility];  Service: Ophthalmology;  Laterality: Left;  CDE:9.76  . CATARACT EXTRACTION W/PHACO Right 06/08/2013   Procedure: CATARACT EXTRACTION PHACO AND INTRAOCULAR LENS PLACEMENT (IOC);  Surgeon: Tonny Branch, MD;  Location: [facility];  Service: Ophthalmology;  Laterality: Right;  CDE 13.65  . CHOLECYSTECTOMY  1970  . COLONOSCOPY W/ POLYPECTOMY  2003   Dr [name]  . EP IMPLANTABLE DEVICE N/A 07/18/2016   Procedure: Pacemaker Implant;  Surgeon: Deboraha Sprang, MD;  Location: [facility];  Service: Cardiovascular;  Laterality: N/A;  . ESOPHAGEAL MANOMETRY N/A 10/05/2013   Procedure: ESOPHAGEAL MANOMETRY (EM);  Surgeon: Gatha Mayer, MD;  Location: [facility];  Service: Endoscopy;  Laterality: N/A;  . ESOPHAGOGASTRODUODENOSCOPY  02/05/13   Large Hiatal Hernia  . HERNIA REPAIR    . LAPAROSCOPIC NISSEN FUNDOPLICATION Bilateral 81/44/8185   Procedure: LAPAROSCOPIC NISSEN FUNDOPLICATION with hiatal hernia repair ;  Surgeon: Edward Jolly, MD;  Location: [facility];  Service: General;  Laterality: Bilateral;    Current Outpatient Medications  Medication Sig Dispense Refill  . ALPRAZolam (XANAX) 0.25 MG tablet Take 1 tablet (0.25 mg total) by mouth 2 (two) times daily as needed for anxiety. 30 tablet 2  . aspirin 81 MG tablet Take 81 mg by mouth at bedtime.     . Calcium in Bone Mineral Cmplx 350 MG MISC Take 3 each by mouth at bedtime. Takes 1 in the morning and 2 at night    . carvedilol (COREG) 25 MG tablet TAKE 1/2 TABLET BY MOUTH TWICE A DAY 180 tablet 1  . Cholecalciferol (VITAMIN D3) 2000 UNITS TABS Take 1 tablet by mouth daily with lunch.     . clotrimazole-betamethasone (LOTRISONE) cream Use as directed twice daily as needed to affected area 15 g 1  . Cyanocobalamin (VITAMIN B 12  PO) Take 1 tablet by mouth daily with lunch.    . esomeprazole (NEXIUM) 40 MG capsule TAKE ONE CAPSULE BY MOUTH TWICE A DAY BEFORE MEALS 180 capsule 0  . ezetimibe-simvastatin (VYTORIN) 10-20 MG tablet Take 1 tablet by mouth at bedtime. 90 tablet 3  . folic acid (FOLVITE) 161 MCG tablet Take 400 mcg by mouth every morning.    . furosemide (LASIX) 40 MG tablet Take 1 tablet (40 mg total) by mouth daily. 90 tablet 2  . gabapentin (NEURONTIN) 100 MG capsule Take 1 capsule (100 mg total) by mouth every 8 (eight) hours as needed (pain). 30 capsule 2  . HYDROcodone-acetaminophen (NORCO/VICODIN) 5-325 MG tablet Take 1 tablet by mouth every 6 (six) hours as needed for moderate pain. Dispensed 15  with no refills on 12/03/17    . losartan (COZAAR) 100 MG tablet Take 1 tablet (100 mg total) by mouth daily. 90 tablet 0  . Magnesium 400 MG CAPS Take 400 mg by mouth at bedtime.     . Omega-3 Fatty Acids (FISH OIL) 500 MG CAPS Take 500 mg by mouth daily with lunch.      No current facility-administered medications for this visit.     Allergies  Allergen Reactions  . Chlorthalidone     01/09/13 creatinine 2.2  . Hydrochlorothiazide     Hypokalemia  . Losartan Potassium-Hctz     Hypokalemia  . Norvasc [Amlodipine Besylate]     Edema  . Raloxifene     HTN  . Rofecoxib     HTN      Review of Systems negative except from HPI and PMH  Physical Exam BP 112/70   Pulse 75   Ht 5' (1.524 m)   Wt 174 lb 9.6 oz (79.2 kg)   SpO2 94%   BMI 34.10 kg/m    Well developed and nourished in no acute distress HENT normal Neck supple with JVP-flat Clear Regular rate and rhythm, no murmurs or gallops Abd-soft with active BS No Clubbing cyanosis tr edema Skin-warm and dry A & Oriented  Grossly normal sensory and motor function   ECG sinus with P synchronous pacing and a QRS duration 132 ms  Assessment and  Plan  2:1 AVB  Pacer- His Medtronic The patient's device was interrogated and the information was fully reviewed.  The device was reprogrammed to maximize longevity and prevent auto titration of output   Syncope    Exercise intolerance  No syncope  Euvolemic continue current meds  Dyspnea chronic likely multifactorial including weight, diastolic dysfunction, poor conditioning  Husband is struggling  Recommended that she tak with her PCP regarding antidepressants.  We spent more than 50% of our >25 min visit in face to face counseling regarding the above     Current medicines are reviewed at length with the patient today .  The patient does not  have concerns regarding medicines.

## 2018-07-22 NOTE — Patient Instructions (Signed)
Medication Instructions:  Your physician recommends that you continue on your current medications as directed. Please refer to the Current Medication list given to you today.  Labwork: You will have labs drawn today: BMP   Testing/Procedures: None ordered.  Follow-Up: Your physician recommends that you schedule a follow-up appointment in:   One Year with Dr Klein  Any Other Special Instructions Will Be Listed Below (If Applicable).     If you need a refill on your cardiac medications before your next appointment, please call your pharmacy.  

## 2018-08-12 ENCOUNTER — Other Ambulatory Visit: Payer: Self-pay | Admitting: Internal Medicine

## 2018-08-12 LAB — CUP PACEART REMOTE DEVICE CHECK
Battery Remaining Longevity: 76 mo
Battery Voltage: 3 V
Brady Statistic AP VP Percent: 21.02 %
Brady Statistic AS VS Percent: 0 %
Brady Statistic RA Percent Paced: 21.01 %
Brady Statistic RV Percent Paced: 99.99 %
Date Time Interrogation Session: 20190905160649
Implantable Lead Implant Date: 20170906
Implantable Lead Implant Date: 20170906
Implantable Lead Location: 753859
Implantable Lead Model: 3830
Implantable Lead Model: 5076
Implantable Pulse Generator Implant Date: 20170906
Lead Channel Impedance Value: 304 Ohm
Lead Channel Impedance Value: 399 Ohm
Lead Channel Pacing Threshold Amplitude: 0.5 V
Lead Channel Pacing Threshold Pulse Width: 0.4 ms
Lead Channel Pacing Threshold Pulse Width: 0.4 ms
Lead Channel Sensing Intrinsic Amplitude: 2.5 mV
Lead Channel Setting Pacing Amplitude: 2 V
Lead Channel Setting Sensing Sensitivity: 0.9 mV
MDC IDC LEAD LOCATION: 753860
MDC IDC MSMT LEADCHNL RA IMPEDANCE VALUE: 456 Ohm
MDC IDC MSMT LEADCHNL RA IMPEDANCE VALUE: 570 Ohm
MDC IDC MSMT LEADCHNL RA SENSING INTR AMPL: 2.5 mV
MDC IDC MSMT LEADCHNL RV PACING THRESHOLD AMPLITUDE: 1.75 V
MDC IDC MSMT LEADCHNL RV SENSING INTR AMPL: 5.875 mV
MDC IDC MSMT LEADCHNL RV SENSING INTR AMPL: 5.875 mV
MDC IDC SET LEADCHNL RV PACING AMPLITUDE: 2.5 V
MDC IDC SET LEADCHNL RV PACING PULSEWIDTH: 1 ms
MDC IDC STAT BRADY AP VS PERCENT: 0 %
MDC IDC STAT BRADY AS VP PERCENT: 78.98 %

## 2018-08-21 ENCOUNTER — Telehealth: Payer: Self-pay | Admitting: Internal Medicine

## 2018-08-21 ENCOUNTER — Other Ambulatory Visit: Payer: Self-pay | Admitting: Internal Medicine

## 2018-08-21 DIAGNOSIS — I10 Essential (primary) hypertension: Secondary | ICD-10-CM

## 2018-08-21 MED ORDER — LOSARTAN POTASSIUM 100 MG PO TABS: 100.0000 mg | ORAL_TABLET | Freq: Every day | ORAL | 0 refills | Status: DC

## 2018-08-21 NOTE — Addendum Note (Signed)
Addended by: Juliet Rude on: 08/21/2018 11:15 AM   Modules accepted: Orders

## 2018-08-21 NOTE — Telephone Encounter (Signed)
This was sent to Dr.Jones and not Dr.John. Can you refill please. Patient isn't due for a FU until Dec. She is going out of town on Monday.

## 2018-10-16 ENCOUNTER — Telehealth: Payer: Self-pay

## 2018-10-16 ENCOUNTER — Ambulatory Visit (INDEPENDENT_AMBULATORY_CARE_PROVIDER_SITE_OTHER): Payer: Medicare Other

## 2018-10-16 DIAGNOSIS — I442 Atrioventricular block, complete: Secondary | ICD-10-CM

## 2018-10-16 DIAGNOSIS — R55 Syncope and collapse: Secondary | ICD-10-CM

## 2018-10-16 NOTE — Telephone Encounter (Signed)
Spoke with pt and reminded pt of remote transmission that is due today. Pt verbalized understanding.   

## 2018-10-17 NOTE — Progress Notes (Signed)
Remote pacemaker transmission.   

## 2018-10-22 ENCOUNTER — Encounter: Payer: Self-pay | Admitting: Cardiology

## 2018-11-17 ENCOUNTER — Other Ambulatory Visit: Payer: Self-pay | Admitting: Internal Medicine

## 2018-11-17 DIAGNOSIS — I10 Essential (primary) hypertension: Secondary | ICD-10-CM

## 2018-11-19 ENCOUNTER — Other Ambulatory Visit: Payer: Self-pay | Admitting: Internal Medicine

## 2018-12-01 LAB — CUP PACEART REMOTE DEVICE CHECK
Battery Remaining Longevity: 74 mo
Battery Voltage: 3 V
Brady Statistic AP VP Percent: 21.58 %
Brady Statistic AP VS Percent: 0 %
Brady Statistic AS VP Percent: 78.42 %
Brady Statistic AS VS Percent: 0 %
Brady Statistic RA Percent Paced: 21.57 %
Brady Statistic RV Percent Paced: 100 %
Date Time Interrogation Session: 20191205211356
Implantable Lead Implant Date: 20170906
Implantable Lead Implant Date: 20170906
Implantable Lead Location: 753859
Implantable Lead Location: 753860
Implantable Lead Model: 3830
Implantable Lead Model: 5076
Implantable Pulse Generator Implant Date: 20170906
Lead Channel Impedance Value: 323 Ohm
Lead Channel Impedance Value: 418 Ohm
Lead Channel Impedance Value: 475 Ohm
Lead Channel Impedance Value: 589 Ohm
Lead Channel Pacing Threshold Amplitude: 0.5 V
Lead Channel Pacing Threshold Amplitude: 1.625 V
Lead Channel Pacing Threshold Pulse Width: 0.4 ms
Lead Channel Pacing Threshold Pulse Width: 0.4 ms
Lead Channel Sensing Intrinsic Amplitude: 2.25 mV
Lead Channel Sensing Intrinsic Amplitude: 2.25 mV
Lead Channel Sensing Intrinsic Amplitude: 5.875 mV
Lead Channel Sensing Intrinsic Amplitude: 5.875 mV
Lead Channel Setting Pacing Amplitude: 2 V
Lead Channel Setting Pacing Amplitude: 2.5 V
Lead Channel Setting Pacing Pulse Width: 1 ms
Lead Channel Setting Sensing Sensitivity: 0.9 mV

## 2019-01-05 ENCOUNTER — Other Ambulatory Visit: Payer: Self-pay | Admitting: Internal Medicine

## 2019-01-05 NOTE — Telephone Encounter (Signed)
Done erx 

## 2019-01-15 ENCOUNTER — Ambulatory Visit (INDEPENDENT_AMBULATORY_CARE_PROVIDER_SITE_OTHER): Payer: Medicare Other | Admitting: *Deleted

## 2019-01-15 DIAGNOSIS — I442 Atrioventricular block, complete: Secondary | ICD-10-CM | POA: Diagnosis not present

## 2019-01-16 LAB — CUP PACEART REMOTE DEVICE CHECK
Battery Remaining Longevity: 72 mo
Brady Statistic AP VS Percent: 0 %
Brady Statistic AS VP Percent: 79.61 %
Brady Statistic AS VS Percent: 0 %
Brady Statistic RA Percent Paced: 20.38 %
Brady Statistic RV Percent Paced: 99.99 %
Date Time Interrogation Session: 20200306000856
Implantable Lead Implant Date: 20170906
Implantable Lead Implant Date: 20170906
Implantable Lead Location: 753859
Implantable Lead Location: 753860
Implantable Lead Model: 3830
Implantable Lead Model: 5076
Implantable Pulse Generator Implant Date: 20170906
Lead Channel Impedance Value: 323 Ohm
Lead Channel Impedance Value: 437 Ohm
Lead Channel Impedance Value: 532 Ohm
Lead Channel Pacing Threshold Amplitude: 0.5 V
Lead Channel Pacing Threshold Amplitude: 1.875 V
Lead Channel Pacing Threshold Pulse Width: 0.4 ms
Lead Channel Pacing Threshold Pulse Width: 0.4 ms
Lead Channel Sensing Intrinsic Amplitude: 1.875 mV
Lead Channel Sensing Intrinsic Amplitude: 1.875 mV
Lead Channel Sensing Intrinsic Amplitude: 5.5 mV
Lead Channel Sensing Intrinsic Amplitude: 5.5 mV
Lead Channel Setting Pacing Amplitude: 2 V
Lead Channel Setting Pacing Pulse Width: 1 ms
Lead Channel Setting Sensing Sensitivity: 0.9 mV
MDC IDC MSMT BATTERY VOLTAGE: 3 V
MDC IDC MSMT LEADCHNL RV IMPEDANCE VALUE: 399 Ohm
MDC IDC SET LEADCHNL RV PACING AMPLITUDE: 2.5 V
MDC IDC STAT BRADY AP VP PERCENT: 20.39 %

## 2019-01-19 ENCOUNTER — Other Ambulatory Visit: Payer: Self-pay | Admitting: Internal Medicine

## 2019-01-26 ENCOUNTER — Encounter: Payer: Self-pay | Admitting: Cardiology

## 2019-01-26 NOTE — Progress Notes (Signed)
Remote pacemaker transmission.   

## 2019-02-09 ENCOUNTER — Telehealth: Payer: Self-pay | Admitting: Internal Medicine

## 2019-02-09 MED ORDER — ESOMEPRAZOLE MAGNESIUM 40 MG PO CPDR: DELAYED_RELEASE_CAPSULE | ORAL | 0 refills | Status: DC

## 2019-02-09 NOTE — Telephone Encounter (Signed)
Pt called would like a refill for a esomeprazole ([place]) for 3 month.

## 2019-02-09 NOTE — Telephone Encounter (Signed)
Called patient and confirmed drugstore to send her Nexium in to.

## 2019-02-25 ENCOUNTER — Other Ambulatory Visit: Payer: Self-pay | Admitting: Internal Medicine

## 2019-04-16 ENCOUNTER — Ambulatory Visit (INDEPENDENT_AMBULATORY_CARE_PROVIDER_SITE_OTHER): Payer: Medicare Other | Admitting: *Deleted

## 2019-04-16 DIAGNOSIS — R55 Syncope and collapse: Secondary | ICD-10-CM

## 2019-04-16 DIAGNOSIS — I442 Atrioventricular block, complete: Secondary | ICD-10-CM

## 2019-04-17 ENCOUNTER — Telehealth: Payer: Self-pay

## 2019-04-17 LAB — CUP PACEART REMOTE DEVICE CHECK
Battery Remaining Longevity: 63 mo
Battery Voltage: 3 V
Brady Statistic AP VP Percent: 19.24 %
Brady Statistic AP VS Percent: 0 %
Brady Statistic AS VP Percent: 80.76 %
Brady Statistic AS VS Percent: 0.01 %
Brady Statistic RA Percent Paced: 19.22 %
Brady Statistic RV Percent Paced: 99.93 %
Date Time Interrogation Session: 20200605183349
Implantable Lead Implant Date: 20170906
Implantable Lead Implant Date: 20170906
Implantable Lead Location: 753859
Implantable Lead Location: 753860
Implantable Lead Model: 3830
Implantable Lead Model: 5076
Implantable Pulse Generator Implant Date: 20170906
Lead Channel Impedance Value: 342 Ohm
Lead Channel Impedance Value: 418 Ohm
Lead Channel Impedance Value: 456 Ohm
Lead Channel Impedance Value: 551 Ohm
Lead Channel Pacing Threshold Amplitude: 0.5 V
Lead Channel Pacing Threshold Amplitude: 1.875 V
Lead Channel Pacing Threshold Pulse Width: 0.4 ms
Lead Channel Pacing Threshold Pulse Width: 0.4 ms
Lead Channel Sensing Intrinsic Amplitude: 2.125 mV
Lead Channel Sensing Intrinsic Amplitude: 4.875 mV
Lead Channel Setting Pacing Amplitude: 2 V
Lead Channel Setting Pacing Amplitude: 2.5 V
Lead Channel Setting Pacing Pulse Width: 1 ms
Lead Channel Setting Sensing Sensitivity: 0.9 mV

## 2019-04-17 NOTE — Telephone Encounter (Signed)
Left message for patient to remind of missed remote transmission.  

## 2019-04-24 NOTE — Progress Notes (Signed)
Remote pacemaker transmission.   

## 2019-05-01 ENCOUNTER — Other Ambulatory Visit (INDEPENDENT_AMBULATORY_CARE_PROVIDER_SITE_OTHER): Payer: Medicare Other

## 2019-05-01 ENCOUNTER — Encounter: Payer: Self-pay | Admitting: Internal Medicine

## 2019-05-01 ENCOUNTER — Ambulatory Visit (INDEPENDENT_AMBULATORY_CARE_PROVIDER_SITE_OTHER): Payer: Medicare Other | Admitting: Internal Medicine

## 2019-05-01 ENCOUNTER — Other Ambulatory Visit: Payer: Self-pay

## 2019-05-01 VITALS — BP 122/86 | HR 96 | Temp 98.0°F | Ht 60.0 in | Wt 181.0 lb

## 2019-05-01 DIAGNOSIS — E538 Deficiency of other specified B group vitamins: Secondary | ICD-10-CM

## 2019-05-01 DIAGNOSIS — N183 Chronic kidney disease, stage 3 unspecified: Secondary | ICD-10-CM

## 2019-05-01 DIAGNOSIS — E611 Iron deficiency: Secondary | ICD-10-CM

## 2019-05-01 DIAGNOSIS — Z0001 Encounter for general adult medical examination with abnormal findings: Secondary | ICD-10-CM

## 2019-05-01 DIAGNOSIS — R739 Hyperglycemia, unspecified: Secondary | ICD-10-CM | POA: Diagnosis not present

## 2019-05-01 DIAGNOSIS — E559 Vitamin D deficiency, unspecified: Secondary | ICD-10-CM

## 2019-05-01 DIAGNOSIS — E782 Mixed hyperlipidemia: Secondary | ICD-10-CM

## 2019-05-01 DIAGNOSIS — G5601 Carpal tunnel syndrome, right upper limb: Secondary | ICD-10-CM

## 2019-05-01 DIAGNOSIS — I10 Essential (primary) hypertension: Secondary | ICD-10-CM | POA: Diagnosis not present

## 2019-05-01 LAB — BASIC METABOLIC PANEL
BUN: 24 mg/dL — ABNORMAL HIGH (ref 6–23)
CO2: 31 mEq/L (ref 19–32)
Calcium: 9.6 mg/dL (ref 8.4–10.5)
Chloride: 102 mEq/L (ref 96–112)
Creatinine, Ser: 1.29 mg/dL — ABNORMAL HIGH (ref 0.40–1.20)
GFR: 39.67 mL/min — ABNORMAL LOW (ref >60.00)
Glucose, Bld: 99 mg/dL (ref 70–99)
Potassium: 3.5 mEq/L (ref 3.5–5.1)
Sodium: 143 mEq/L (ref 135–145)

## 2019-05-01 LAB — URINALYSIS, ROUTINE W REFLEX MICROSCOPIC
Bilirubin Urine: NEGATIVE
Hgb urine dipstick: NEGATIVE
Ketones, ur: NEGATIVE
Nitrite: NEGATIVE
RBC / HPF: NONE SEEN (ref 0–?)
Specific Gravity, Urine: 1.025 (ref 1.000–1.030)
Total Protein, Urine: NEGATIVE
Urine Glucose: NEGATIVE
Urobilinogen, UA: 0.2 (ref 0.0–1.0)
pH: 5.5 (ref 5.0–8.0)

## 2019-05-01 LAB — HEPATIC FUNCTION PANEL
ALT: 18 U/L (ref 0–35)
AST: 20 U/L (ref 0–37)
Albumin: 3.9 g/dL (ref 3.5–5.2)
Alkaline Phosphatase: 52 U/L (ref 39–117)
Bilirubin, Direct: 0.1 mg/dL (ref 0.0–0.3)
Total Bilirubin: 0.5 mg/dL (ref 0.2–1.2)
Total Protein: 6.5 g/dL (ref 6.0–8.3)

## 2019-05-01 LAB — VITAMIN D 25 HYDROXY (VIT D DEFICIENCY, FRACTURES): VITD: 59.25 ng/mL (ref 30.00–100.00)

## 2019-05-01 LAB — CBC WITH DIFFERENTIAL/PLATELET
Basophils Absolute: 0.1 10*3/uL (ref 0.0–0.1)
Basophils Relative: 1.1 % (ref 0.0–3.0)
Eosinophils Absolute: 0.3 10*3/uL (ref 0.0–0.7)
Eosinophils Relative: 4.2 % (ref 0.0–5.0)
HCT: 39.2 % (ref 36.0–46.0)
Hemoglobin: 12.8 g/dL (ref 12.0–15.0)
Lymphocytes Relative: 30.5 % (ref 12.0–46.0)
Lymphs Abs: 2.3 10*3/uL (ref 0.7–4.0)
MCHC: 32.6 g/dL (ref 30.0–36.0)
MCV: 88.5 fl (ref 78.0–100.0)
Monocytes Absolute: 0.7 10*3/uL (ref 0.1–1.0)
Monocytes Relative: 9.6 % (ref 3.0–12.0)
Neutro Abs: 4.1 10*3/uL (ref 1.4–7.7)
Neutrophils Relative %: 54.6 % (ref 43.0–77.0)
Platelets: 224 10*3/uL (ref 150.0–400.0)
RBC: 4.43 Mil/uL (ref 3.87–5.11)
RDW: 14.8 % (ref 11.5–15.5)
WBC: 7.5 10*3/uL (ref 4.0–10.5)

## 2019-05-01 LAB — LIPID PANEL
Cholesterol: 153 mg/dL (ref 0–200)
HDL: 45.7 mg/dL (ref >39.00)
LDL Cholesterol: 83 mg/dL (ref 0–99)
NonHDL: 107.6
Total CHOL/HDL Ratio: 3
Triglycerides: 123 mg/dL (ref 0.0–149.0)
VLDL: 24.6 mg/dL (ref 0.0–40.0)

## 2019-05-01 LAB — HEMOGLOBIN A1C: Hgb A1c MFr Bld: 6.4 % (ref 4.6–6.5)

## 2019-05-01 LAB — VITAMIN B12: Vitamin B-12: 663 pg/mL (ref 211–911)

## 2019-05-01 LAB — IBC PANEL
Iron: 83 ug/dL (ref 42–145)
Saturation Ratios: 23 % (ref 20.0–50.0)
Transferrin: 258 mg/dL (ref 212.0–360.0)

## 2019-05-01 LAB — TSH: TSH: 1.87 u[IU]/mL (ref 0.35–4.50)

## 2019-05-01 NOTE — Progress Notes (Signed)
Subjective:    Patient ID: Kylie Patrick, female    DOB: 1936/11/10, 83 y.o.   MRN: [id number]  HPI  Here for wellness and f/u;  Overall doing ok;  Pt denies Chest pain, worsening SOB, DOE, wheezing, orthopnea, PND, worsening LE edema, palpitations, dizziness or syncope.  Pt denies neurological change such as new headache, facial or extremity weakness.  Pt denies polydipsia, polyuria, or low sugar symptoms. Pt states overall good compliance with treatment and medications, good tolerability, and has been trying to follow appropriate diet.  Pt denies worsening depressive symptoms, suicidal ideation or panic. No fever, night sweats, wt loss, loss of appetite, or other constitutional symptoms.  Pt states good ability with ADL's, has low fall risk, home safety reviewed and adequate, no other significant changes in hearing or vision, and only occasionally active with exercise. Has been somewhat feeling low but not really depressed as she cant go to the stores and other places with the pandemic.  Conts to see Dr Deedra Ehrich.   Also, c/o mild intermittent 1 mo worsening right hand and distal arm numbness and tingling without pain or weakness, worse in the AM after sleeping, better later in the day.  Past Medical History:  Diagnosis Date  . Anxiety   . Colon polyp 2003  . Complication of anesthesia    slow to wake up one time  . Gastroenteritis    w/ renal insufficiency in context of protracted nausea & vomitting 2006  . GERD (gastroesophageal reflux disease)   . H/O hiatal hernia   . Headache(784.0)    occasional  . Hyperlipidemia   . Hypertension    w/ LVH on ECHO  . Pacemaker 07/2016  . Tonsillitis 2006   Past Surgical History:  Procedure Laterality Date  . ABDOMINAL HYSTERECTOMY  1976   BSO for endometriosis and bengin tumor  . APPENDECTOMY  1970  . BREAST LUMPECTOMY Left 70's   X 2  . BUNIONECTOMY Bilateral   . CATARACT EXTRACTION W/PHACO Left 05/14/2013   Procedure: CATARACT  EXTRACTION PHACO AND INTRAOCULAR LENS PLACEMENT (IOC);  Surgeon: Tonny Branch, MD;  Location: [facility];  Service: Ophthalmology;  Laterality: Left;  CDE:9.76  . CATARACT EXTRACTION W/PHACO Right 06/08/2013   Procedure: CATARACT EXTRACTION PHACO AND INTRAOCULAR LENS PLACEMENT (IOC);  Surgeon: Tonny Branch, MD;  Location: [facility];  Service: Ophthalmology;  Laterality: Right;  CDE 13.65  . CHOLECYSTECTOMY  1970  . COLONOSCOPY W/ POLYPECTOMY  2003   Dr [name]  . EP IMPLANTABLE DEVICE N/A 07/18/2016   Procedure: Pacemaker Implant;  Surgeon: Deboraha Sprang, MD;  Location: [facility];  Service: Cardiovascular;  Laterality: N/A;  . ESOPHAGEAL MANOMETRY N/A 10/05/2013   Procedure: ESOPHAGEAL MANOMETRY (EM);  Surgeon: Gatha Mayer, MD;  Location: [facility];  Service: Endoscopy;  Laterality: N/A;  . ESOPHAGOGASTRODUODENOSCOPY  02/05/13   Large Hiatal Hernia  . HERNIA REPAIR    . LAPAROSCOPIC NISSEN FUNDOPLICATION Bilateral 56/38/7564   Procedure: LAPAROSCOPIC NISSEN FUNDOPLICATION with hiatal hernia repair ;  Surgeon: Edward Jolly, MD;  Location: [facility];  Service: General;  Laterality: Bilateral;    reports that she has never smoked. She has never used smokeless tobacco. She reports current alcohol use. She reports that she does not use drugs. family history includes Aneurysm in her paternal grandmother; Breast cancer in her maternal aunt; Coronary artery disease in her maternal grandfather and mother; Diabetes in her maternal aunt and mother; Heart attack (age of onset: 36) in her  father; Heart disease in her sister; Stomach cancer in her maternal aunt; Stroke in her maternal grandmother and paternal grandfather. Allergies  Allergen Reactions  . Chlorthalidone     01/09/13 creatinine 2.2  . Hydrochlorothiazide     Hypokalemia  . Losartan Potassium-Hctz     Hypokalemia  . Norvasc [Amlodipine Besylate]     Edema  . Raloxifene     HTN  . Rofecoxib     HTN   Current Outpatient Medications  on File Prior to Visit  Medication Sig Dispense Refill  . ALPRAZolam (XANAX) 0.25 MG tablet TAKE 1 TABLET BY MOUTH 2 TIMES DAILY AS NEEDED FOR ANXIETY. 30 tablet 2  . aspirin 81 MG tablet Take 81 mg by mouth at bedtime.     . Calcium in Bone Mineral Cmplx 350 MG MISC Take 3 each by mouth at bedtime. Takes 1 in the morning and 2 at night    . carvedilol (COREG) 25 MG tablet TAKE 1/2 TABLET BY MOUTH TWICE A DAY 180 tablet 1  . Cholecalciferol (VITAMIN D3) 2000 UNITS TABS Take 1 tablet by mouth daily with lunch.     . clotrimazole-betamethasone (LOTRISONE) cream Use as directed twice daily as needed to affected area 15 g 1  . Cyanocobalamin (VITAMIN B 12 PO) Take 1 tablet by mouth daily with lunch.    . esomeprazole (NEXIUM) 40 MG capsule TAKE ONE CAPSULE BY MOUTH TWICE A DAY BEFORE MEALS 180 capsule 0  . ezetimibe-simvastatin (VYTORIN) 10-20 MG tablet TAKE 1 TABLET BY MOUTH EVERYDAY AT BEDTIME 90 tablet 3  . folic acid (FOLVITE) 354 MCG tablet Take 400 mcg by mouth every morning.    . furosemide (LASIX) 40 MG tablet TAKE 1 TABLET BY MOUTH EVERY DAY 90 tablet 0  . gabapentin (NEURONTIN) 100 MG capsule Take 1 capsule (100 mg total) by mouth every 8 (eight) hours as needed (pain). 30 capsule 2  . HYDROcodone-acetaminophen (NORCO/VICODIN) 5-325 MG tablet Take 1 tablet by mouth every 6 (six) hours as needed for moderate pain. Dispensed 15 with no refills on 12/03/17    . losartan (COZAAR) 100 MG tablet TAKE 1 TABLET BY MOUTH EVERY DAY 90 tablet 1  . Magnesium 400 MG CAPS Take 400 mg by mouth at bedtime.     . Omega-3 Fatty Acids (FISH OIL) 500 MG CAPS Take 500 mg by mouth daily with lunch.      No current facility-administered medications on file prior to visit.    Review of Systems Constitutional: Negative for other unusual diaphoresis, sweats, appetite or weight changes HENT: Negative for other worsening hearing loss, ear pain, facial swelling, mouth sores or neck stiffness.   Eyes: Negative for  other worsening pain, redness or other visual disturbance.  Respiratory: Negative for other stridor or swelling Cardiovascular: Negative for other palpitations or other chest pain  Gastrointestinal: Negative for worsening diarrhea or loose stools, blood in stool, distention or other pain Genitourinary: Negative for hematuria, flank pain or other change in urine volume.  Musculoskeletal: Negative for myalgias or other joint swelling.  Skin: Negative for other color change, or other wound or worsening drainage.  Neurological: Negative for other syncope or numbness. Hematological: Negative for other adenopathy or swelling Psychiatric/Behavioral: Negative for hallucinations, other worsening agitation, SI, self-injury, or new decreased concentration All other system neg per pt    Objective:   Physical Exam BP 122/86   Pulse 96   Temp 98 F (36.7 C) (Oral)   Ht 5' (1.524 m)  Wt 181 lb (82.1 kg)   SpO2 94%   BMI 35.35 kg/m  VS noted,  Constitutional: Pt is oriented to person, place, and time. Appears well-developed and well-nourished, in no significant distress and comfortable Head: Normocephalic and atraumatic  Eyes: Conjunctivae and EOM are normal. Pupils are equal, round, and reactive to light Right Ear: External ear normal without discharge Left Ear: External ear normal without discharge Nose: Nose without discharge or deformity Mouth/Throat: Oropharynx is without other ulcerations and moist  Neck: Normal range of motion. Neck supple. No JVD present. No tracheal deviation present or significant neck LA or mass Cardiovascular: Normal rate, regular rhythm, normal heart sounds and intact distal pulses.   Pulmonary/Chest: WOB normal and breath sounds without rales or wheezing  Abdominal: Soft. Bowel sounds are normal. NT. No HSM  Musculoskeletal: Normal range of motion. Exhibits no edema Lymphadenopathy: Has no other cervical adenopathy.  Neurological: Pt is alert and oriented to  person, place, and time. Pt has normal reflexes. No cranial nerve deficit. Motor grossly intact, Gait intact Skin: Skin is warm and dry. No rash noted or new ulcerations Psychiatric:  Has normal mood and affect. Behavior is normal without agitation No other exam findings Lab Results  Component Value Date   WBC 7.5 05/01/2019   HGB 12.8 05/01/2019   HCT 39.2 05/01/2019   PLT 224.0 05/01/2019   GLUCOSE 99 05/01/2019   CHOL 153 05/01/2019   TRIG 123.0 05/01/2019   HDL 45.70 05/01/2019   LDLCALC 83 05/01/2019   ALT 18 05/01/2019   AST 20 05/01/2019   NA 143 05/01/2019   K 3.5 05/01/2019   CL 102 05/01/2019   CREATININE 1.29 (H) 05/01/2019   BUN 24 (H) 05/01/2019   CO2 31 05/01/2019   TSH 1.87 05/01/2019   HGBA1C 6.4 05/01/2019   MICROALBUR 2.1 (H) 10/02/2010       Assessment & Plan:

## 2019-05-01 NOTE — Patient Instructions (Signed)
Please continue all other medications as before, and refills have been done if requested.  Please have the pharmacy call with any other refills you may need.  Please continue your efforts at being more active, low cholesterol diet, and weight control.  You are otherwise up to date with prevention measures today.  Please keep your appointments with your specialists as you may have planned  Your lab results will be forwarded to [facility]  - Dr Darnell Level  Please go to the LAB in the Basement (turn left off the elevator) for the tests to be done today  You will be contacted by phone if any changes need to be made immediately.  Otherwise, you will receive a letter about your results with an explanation, but please check with MyChart first.  Please remember to sign up for MyChart if you have not done so, as this will be important to you in the future with finding out test results, communicating by private email, and scheduling acute appointments online when needed.  Please return in 6 months, or sooner if neede

## 2019-05-02 ENCOUNTER — Encounter: Payer: Self-pay | Admitting: Internal Medicine

## 2019-05-02 DIAGNOSIS — G5601 Carpal tunnel syndrome, right upper limb: Secondary | ICD-10-CM | POA: Insufficient documentation

## 2019-05-02 NOTE — Assessment & Plan Note (Signed)

## 2019-05-02 NOTE — Assessment & Plan Note (Signed)
stable overall by history and exam, recent data reviewed with pt, and pt to continue medical treatment as before,  to f/u any worsening symptoms or concerns  

## 2019-05-02 NOTE — Assessment & Plan Note (Addendum)
Mild to mod, for right wrist splint qhs until improved, consider hand surgury referral,  to f/u any worsening symptoms or concerns  In addition to the time spent performing CPE, I spent an additional 15 minutes face to face,in which greater than 50% of this time was spent in counseling and coordination of care for patient's illness as documented, including the differential dx, treatment, further evaluation and other management of right carpal tunnel syndrome, CKD, HTN, hyperglycemia, HLD

## 2019-05-04 ENCOUNTER — Other Ambulatory Visit: Payer: Self-pay

## 2019-05-04 MED ORDER — ESOMEPRAZOLE MAGNESIUM 40 MG PO CPDR: DELAYED_RELEASE_CAPSULE | ORAL | 0 refills | Status: DC

## 2019-05-11 ENCOUNTER — Other Ambulatory Visit: Payer: Self-pay | Admitting: Internal Medicine

## 2019-05-20 ENCOUNTER — Other Ambulatory Visit: Payer: Self-pay | Admitting: Internal Medicine

## 2019-06-11 ENCOUNTER — Other Ambulatory Visit: Payer: Self-pay | Admitting: Internal Medicine

## 2019-06-11 DIAGNOSIS — I10 Essential (primary) hypertension: Secondary | ICD-10-CM

## 2019-07-16 ENCOUNTER — Ambulatory Visit (INDEPENDENT_AMBULATORY_CARE_PROVIDER_SITE_OTHER): Payer: Medicare Other | Admitting: *Deleted

## 2019-07-16 DIAGNOSIS — I442 Atrioventricular block, complete: Secondary | ICD-10-CM | POA: Diagnosis not present

## 2019-07-16 LAB — CUP PACEART REMOTE DEVICE CHECK
Battery Remaining Longevity: 61 mo
Battery Voltage: 3 V
Brady Statistic AP VP Percent: 30.56 %
Brady Statistic AP VS Percent: 0 %
Brady Statistic AS VP Percent: 69.43 %
Brady Statistic AS VS Percent: 0 %
Brady Statistic RA Percent Paced: 30.55 %
Brady Statistic RV Percent Paced: 100 %
Date Time Interrogation Session: 20200903150228
Implantable Lead Implant Date: 20170906
Implantable Lead Implant Date: 20170906
Implantable Lead Location: 753859
Implantable Lead Location: 753860
Implantable Lead Model: 3830
Implantable Lead Model: 5076
Implantable Pulse Generator Implant Date: 20170906
Lead Channel Impedance Value: 323 Ohm
Lead Channel Impedance Value: 399 Ohm
Lead Channel Impedance Value: 437 Ohm
Lead Channel Impedance Value: 532 Ohm
Lead Channel Pacing Threshold Amplitude: 0.5 V
Lead Channel Pacing Threshold Amplitude: 1.625 V
Lead Channel Pacing Threshold Pulse Width: 0.4 ms
Lead Channel Pacing Threshold Pulse Width: 0.4 ms
Lead Channel Sensing Intrinsic Amplitude: 2.125 mV
Lead Channel Sensing Intrinsic Amplitude: 2.125 mV
Lead Channel Sensing Intrinsic Amplitude: 6 mV
Lead Channel Sensing Intrinsic Amplitude: 6 mV
Lead Channel Setting Pacing Amplitude: 2 V
Lead Channel Setting Pacing Amplitude: 2.5 V
Lead Channel Setting Pacing Pulse Width: 1 ms
Lead Channel Setting Sensing Sensitivity: 0.9 mV

## 2019-07-30 ENCOUNTER — Encounter: Payer: Self-pay | Admitting: Cardiology

## 2019-07-30 NOTE — Progress Notes (Signed)
Remote pacemaker transmission.   

## 2019-08-03 ENCOUNTER — Other Ambulatory Visit: Payer: Self-pay

## 2019-08-03 MED ORDER — ESOMEPRAZOLE MAGNESIUM 40 MG PO CPDR: DELAYED_RELEASE_CAPSULE | ORAL | 0 refills | Status: DC

## 2019-08-11 ENCOUNTER — Other Ambulatory Visit: Payer: Self-pay

## 2019-08-11 ENCOUNTER — Ambulatory Visit (INDEPENDENT_AMBULATORY_CARE_PROVIDER_SITE_OTHER): Payer: Medicare Other | Admitting: Internal Medicine

## 2019-08-11 ENCOUNTER — Encounter: Payer: Self-pay | Admitting: Internal Medicine

## 2019-08-11 VITALS — BP 128/80 | HR 80 | Ht 60.0 in | Wt 169.6 lb

## 2019-08-11 DIAGNOSIS — R55 Syncope and collapse: Secondary | ICD-10-CM | POA: Diagnosis not present

## 2019-08-11 DIAGNOSIS — Z950 Presence of cardiac pacemaker: Secondary | ICD-10-CM | POA: Diagnosis not present

## 2019-08-11 DIAGNOSIS — I442 Atrioventricular block, complete: Secondary | ICD-10-CM | POA: Diagnosis not present

## 2019-08-11 NOTE — Patient Instructions (Signed)

## 2019-08-11 NOTE — Progress Notes (Signed)
Patient Care Team: Biagio Borg, MD as PCP - General (Internal Medicine)   HPI  Kylie Patrick is a 83 y.o. female Seen in followup for His Bundle pacemaker implanted 9/17 for symptomatic 2:1 block She had had syncope and then weakness prior to pacing  12/16 Echo  EF 55-60%  No recurrent syncope;    Date Cr K Mg  6/18  1.5 4.0           The patient denies chest pain, shortness of breath, nocturnal dyspnea, orthopnea or peripheral edema.  There have been no palpitations, lightheadedness or syncope.   Past Medical History:  Diagnosis Date  . Anxiety   . Colon polyp 2003  . Complication of anesthesia    slow to wake up one time  . Gastroenteritis    w/ renal insufficiency in context of protracted nausea & vomitting 2006  . GERD (gastroesophageal reflux disease)   . H/O hiatal hernia   . Headache(784.0)    occasional  . Hyperlipidemia   . Hypertension    w/ LVH on ECHO  . Pacemaker 07/2016  . Tonsillitis 2006    Past Surgical History:  Procedure Laterality Date  . ABDOMINAL HYSTERECTOMY  1976   BSO for endometriosis and bengin tumor  . APPENDECTOMY  1970  . BREAST LUMPECTOMY Left 70's   X 2  . BUNIONECTOMY Bilateral   . CATARACT EXTRACTION W/PHACO Left 05/14/2013   Procedure: CATARACT EXTRACTION PHACO AND INTRAOCULAR LENS PLACEMENT (IOC);  Surgeon: Tonny Branch, MD;  Location: [facility];  Service: Ophthalmology;  Laterality: Left;  CDE:9.76  . CATARACT EXTRACTION W/PHACO Right 06/08/2013   Procedure: CATARACT EXTRACTION PHACO AND INTRAOCULAR LENS PLACEMENT (IOC);  Surgeon: Tonny Branch, MD;  Location: [facility];  Service: Ophthalmology;  Laterality: Right;  CDE 13.65  . CHOLECYSTECTOMY  1970  . COLONOSCOPY W/ POLYPECTOMY  2003   Dr [name]  . EP IMPLANTABLE DEVICE N/A 07/18/2016   Procedure: Pacemaker Implant;  Surgeon: Deboraha Sprang, MD;  Location: [facility];  Service: Cardiovascular;  Laterality: N/A;  . ESOPHAGEAL MANOMETRY N/A 10/05/2013   Procedure:  ESOPHAGEAL MANOMETRY (EM);  Surgeon: Gatha Mayer, MD;  Location: [facility];  Service: Endoscopy;  Laterality: N/A;  . ESOPHAGOGASTRODUODENOSCOPY  02/05/13   Large Hiatal Hernia  . HERNIA REPAIR    . LAPAROSCOPIC NISSEN FUNDOPLICATION Bilateral 99991111   Procedure: LAPAROSCOPIC NISSEN FUNDOPLICATION with hiatal hernia repair ;  Surgeon: Edward Jolly, MD;  Location: [facility];  Service: General;  Laterality: Bilateral;    Current Outpatient Medications  Medication Sig Dispense Refill  . ALPRAZolam (XANAX) 0.25 MG tablet TAKE 1 TABLET BY MOUTH 2 TIMES DAILY AS NEEDED FOR ANXIETY. 30 tablet 2  . aspirin 81 MG tablet Take 81 mg by mouth at bedtime.     . Calcium in Bone Mineral Cmplx 350 MG MISC Take 3 each by mouth at bedtime. Takes 1 in the morning and 2 at night    . carvedilol (COREG) 25 MG tablet TAKE 1/2 TABLET BY MOUTH TWICE A DAY 180 tablet 1  . Cholecalciferol (VITAMIN D3) 2000 UNITS TABS Take 1 tablet by mouth daily with lunch.     . Cyanocobalamin (VITAMIN B 12 PO) Take 1 tablet by mouth daily with lunch.    . esomeprazole (NEXIUM) 40 MG capsule TAKE ONE CAPSULE BY MOUTH TWICE A DAY BEFORE MEALS 180 capsule 0  . ezetimibe-simvastatin (VYTORIN) 10-20 MG tablet TAKE 1 TABLET BY MOUTH  EVERYDAY AT BEDTIME 90 tablet 3  . folic acid (FOLVITE) A999333 MCG tablet Take 400 mcg by mouth every morning.    . furosemide (LASIX) 40 MG tablet TAKE 1 TABLET BY MOUTH EVERY DAY 90 tablet 1  . gabapentin (NEURONTIN) 100 MG capsule Take 1 capsule (100 mg total) by mouth every 8 (eight) hours as needed (pain). 30 capsule 2  . losartan (COZAAR) 100 MG tablet TAKE 1 TABLET BY MOUTH EVERY DAY 90 tablet 1  . Magnesium 400 MG CAPS Take 400 mg by mouth at bedtime.     . Omega-3 Fatty Acids (FISH OIL) 500 MG CAPS Take 500 mg by mouth daily with lunch.      No current facility-administered medications for this visit.     Allergies  Allergen Reactions  . Chlorthalidone     01/09/13 creatinine 2.2  .  Hydrochlorothiazide     Hypokalemia  . Losartan Potassium-Hctz     Hypokalemia  . Norvasc [Amlodipine Besylate]     Edema  . Raloxifene     HTN  . Rofecoxib     HTN      Review of Systems negative except from HPI and PMH  Physical Exam BP 128/80   Pulse 80   Ht 5' (1.524 m)   Wt 169 lb 9.6 oz (76.9 kg)   SpO2 99%   BMI 33.12 kg/m    Well developed and well nourished in no acute distress HENT normal Neck supple with JVP-flat Clear Device pocket well healed; without hematoma or erythema.  There is no tethering  Regular rate and rhythm, no  gallop No / murmur Abd-soft with active BS No Clubbing cyanosis  edema Skin-warm and dry A & Oriented  Grossly normal sensory and motor function  ECG P-synchronous/ AV  pacing  Assessment and  Plan  2:1 AVB>> Complete heart block   Pacer- His [employer]onic The patient's device was interrogated and the information was fully reviewed.  The device was reprogrammed to maximize longevity and prevent auto titration of output   Syncope    Exercise intolerance  No syncope  Euvolemic continue current meds BP well controlled   Struggling with Covid isolation       Current medicines are reviewed at length with the patient today .  The patient does not  have concerns regarding medicines.

## 2019-10-15 ENCOUNTER — Ambulatory Visit (INDEPENDENT_AMBULATORY_CARE_PROVIDER_SITE_OTHER): Payer: Medicare Other | Admitting: *Deleted

## 2019-10-15 DIAGNOSIS — R001 Bradycardia, unspecified: Secondary | ICD-10-CM | POA: Diagnosis not present

## 2019-10-15 LAB — CUP PACEART REMOTE DEVICE CHECK
Battery Remaining Longevity: 53 mo
Battery Voltage: 2.99 V
Brady Statistic AP VP Percent: 40.35 %
Brady Statistic AP VS Percent: 0 %
Brady Statistic AS VP Percent: 59.62 %
Brady Statistic AS VS Percent: 0.03 %
Brady Statistic RA Percent Paced: 40.32 %
Brady Statistic RV Percent Paced: 99.95 %
Date Time Interrogation Session: 20201203103523
Implantable Lead Implant Date: 20170906
Implantable Lead Implant Date: 20170906
Implantable Lead Location: 753859
Implantable Lead Location: 753860
Implantable Lead Model: 3830
Implantable Lead Model: 5076
Implantable Pulse Generator Implant Date: 20170906
Lead Channel Impedance Value: 342 Ohm
Lead Channel Impedance Value: 418 Ohm
Lead Channel Impedance Value: 437 Ohm
Lead Channel Impedance Value: 532 Ohm
Lead Channel Pacing Threshold Amplitude: 0.5 V
Lead Channel Pacing Threshold Amplitude: 1.75 V
Lead Channel Pacing Threshold Pulse Width: 0.4 ms
Lead Channel Pacing Threshold Pulse Width: 0.4 ms
Lead Channel Sensing Intrinsic Amplitude: 1.875 mV
Lead Channel Sensing Intrinsic Amplitude: 1.875 mV
Lead Channel Sensing Intrinsic Amplitude: 7.25 mV
Lead Channel Sensing Intrinsic Amplitude: 7.25 mV
Lead Channel Setting Pacing Amplitude: 2 V
Lead Channel Setting Pacing Amplitude: 2.5 V
Lead Channel Setting Pacing Pulse Width: 1 ms
Lead Channel Setting Sensing Sensitivity: 0.9 mV

## 2019-11-03 ENCOUNTER — Encounter: Payer: Self-pay | Admitting: Internal Medicine

## 2019-11-03 ENCOUNTER — Other Ambulatory Visit: Payer: Self-pay

## 2019-11-03 ENCOUNTER — Ambulatory Visit (INDEPENDENT_AMBULATORY_CARE_PROVIDER_SITE_OTHER): Payer: Medicare Other | Admitting: Internal Medicine

## 2019-11-03 VITALS — BP 112/76 | HR 68 | Temp 98.0°F | Ht 60.0 in | Wt 166.0 lb

## 2019-11-03 DIAGNOSIS — R739 Hyperglycemia, unspecified: Secondary | ICD-10-CM | POA: Diagnosis not present

## 2019-11-03 DIAGNOSIS — E782 Mixed hyperlipidemia: Secondary | ICD-10-CM

## 2019-11-03 DIAGNOSIS — N183 Chronic kidney disease, stage 3 unspecified: Secondary | ICD-10-CM

## 2019-11-03 DIAGNOSIS — Z0000 Encounter for general adult medical examination without abnormal findings: Secondary | ICD-10-CM

## 2019-11-03 DIAGNOSIS — I10 Essential (primary) hypertension: Secondary | ICD-10-CM

## 2019-11-03 DIAGNOSIS — G5601 Carpal tunnel syndrome, right upper limb: Secondary | ICD-10-CM

## 2019-11-03 LAB — BASIC METABOLIC PANEL
BUN: 21 mg/dL (ref 6–23)
CO2: 31 mEq/L (ref 19–32)
Calcium: 9.6 mg/dL (ref 8.4–10.5)
Chloride: 103 mEq/L (ref 96–112)
Creatinine, Ser: 1.45 mg/dL — ABNORMAL HIGH (ref 0.40–1.20)
GFR: 34.62 mL/min — ABNORMAL LOW (ref >60.00)
Glucose, Bld: 103 mg/dL — ABNORMAL HIGH (ref 70–99)
Potassium: 3.6 mEq/L (ref 3.5–5.1)
Sodium: 142 mEq/L (ref 135–145)

## 2019-11-03 LAB — HEPATIC FUNCTION PANEL
ALT: 18 U/L (ref 0–35)
AST: 24 U/L (ref 0–37)
Albumin: 4 g/dL (ref 3.5–5.2)
Alkaline Phosphatase: 53 U/L (ref 39–117)
Bilirubin, Direct: 0.1 mg/dL (ref 0.0–0.3)
Total Bilirubin: 0.6 mg/dL (ref 0.2–1.2)
Total Protein: 6.3 g/dL (ref 6.0–8.3)

## 2019-11-03 LAB — LIPID PANEL
Cholesterol: 168 mg/dL (ref 0–200)
HDL: 40.7 mg/dL (ref >39.00)
LDL Cholesterol: 91 mg/dL (ref 0–99)
NonHDL: 127.08
Total CHOL/HDL Ratio: 4
Triglycerides: 178 mg/dL — ABNORMAL HIGH (ref 0.0–149.0)
VLDL: 35.6 mg/dL (ref 0.0–40.0)

## 2019-11-03 LAB — HEMOGLOBIN A1C: Hgb A1c MFr Bld: 5.9 % (ref 4.6–6.5)

## 2019-11-03 NOTE — Progress Notes (Signed)
Subjective:    Patient ID: Kylie Patrick, female    DOB: 09-17-1936, 83 y.o.   MRN: [id number]  HPI  Here to f/u; overall doing ok,  Pt denies chest pain, increasing sob or doe, wheezing, orthopnea, PND, increased LE swelling, palpitations, dizziness or syncope.  Pt denies new neurological symptoms such as new headache, or facial or extremity weakness or numbness, except for mild to mod persistent 3 mo right hand numbness and slight discomfort.  Pt denies polydipsia, polyuria, or low sugar episode.  Pt states overall good compliance with meds, mostly trying to follow appropriate diet, but little exercise however.  Had recent wt loss she attributes to a broken tooth incisor, and losing wt since she can only eat soft foots including less carbs, scheduled for permanent crown on Jan 4.   BP Readings from Last 3 Encounters:  11/03/19 112/76  08/11/19 128/80  05/01/19 122/86   Wt Readings from Last 3 Encounters:  11/03/19 166 lb (75.3 kg)  08/11/19 169 lb 9.6 oz (76.9 kg)  05/01/19 181 lb (82.1 kg)   Past Medical History:  Diagnosis Date  . Anxiety   . Colon polyp 2003  . Complication of anesthesia    slow to wake up one time  . Gastroenteritis    w/ renal insufficiency in context of protracted nausea & vomitting 2006  . GERD (gastroesophageal reflux disease)   . H/O hiatal hernia   . Headache(784.0)    occasional  . Hyperlipidemia   . Hypertension    w/ LVH on ECHO  . Pacemaker 07/2016  . Tonsillitis 2006   Past Surgical History:  Procedure Laterality Date  . ABDOMINAL HYSTERECTOMY  1976   BSO for endometriosis and bengin tumor  . APPENDECTOMY  1970  . BREAST LUMPECTOMY Left 70's   X 2  . BUNIONECTOMY Bilateral   . CATARACT EXTRACTION W/PHACO Left 05/14/2013   Procedure: CATARACT EXTRACTION PHACO AND INTRAOCULAR LENS PLACEMENT (IOC);  Surgeon: Tonny Branch, MD;  Location: [facility];  Service: Ophthalmology;  Laterality: Left;  CDE:9.76  . CATARACT EXTRACTION W/PHACO Right 06/08/2013    Procedure: CATARACT EXTRACTION PHACO AND INTRAOCULAR LENS PLACEMENT (IOC);  Surgeon: Tonny Branch, MD;  Location: [facility];  Service: Ophthalmology;  Laterality: Right;  CDE 13.65  . CHOLECYSTECTOMY  1970  . COLONOSCOPY W/ POLYPECTOMY  2003   Dr [name]  . EP IMPLANTABLE DEVICE N/A 07/18/2016   Procedure: Pacemaker Implant;  Surgeon: Deboraha Sprang, MD;  Location: [facility];  Service: Cardiovascular;  Laterality: N/A;  . ESOPHAGEAL MANOMETRY N/A 10/05/2013   Procedure: ESOPHAGEAL MANOMETRY (EM);  Surgeon: Gatha Mayer, MD;  [facility];  Service: Endoscopy;  Laterality: N/A;  . ESOPHAGOGASTRODUODENOSCOPY  02/05/13   Large Hiatal Hernia  . HERNIA REPAIR    . LAPAROSCOPIC NISSEN FUNDOPLICATION Bilateral 99991111   Procedure: LAPAROSCOPIC NISSEN FUNDOPLICATION with hiatal hernia repair ;  Surgeon: Edward Jolly, MD;  Location: [facility];  Service: General;  Laterality: Bilateral;    reports that she has never smoked. She has never used smokeless tobacco. She reports current alcohol use. She reports that she does not use drugs. family history includes Aneurysm in her paternal grandmother; Breast cancer in her maternal aunt; Coronary artery disease in her maternal grandfather and mother; Diabetes in her maternal aunt and mother; Heart attack (age of onset: 92) in her father; Heart disease in her sister; Stomach cancer in her maternal aunt; Stroke in her maternal grandmother and paternal grandfather.  Allergies  Allergen Reactions  . Chlorthalidone     01/09/13 creatinine 2.2  . Hydrochlorothiazide     Hypokalemia  . Losartan Potassium-Hctz     Hypokalemia  . Norvasc [Amlodipine Besylate]     Edema  . Raloxifene     HTN  . Rofecoxib     HTN   Current Outpatient Medications on File Prior to Visit  Medication Sig Dispense Refill  . ALPRAZolam (XANAX) 0.25 MG tablet TAKE 1 TABLET BY MOUTH 2 TIMES DAILY AS NEEDED FOR ANXIETY. 30 tablet 2  . aspirin 81 MG tablet Take 81 mg  by mouth at bedtime.     . Calcium in Bone Mineral Cmplx 350 MG MISC Take 3 each by mouth at bedtime. Takes 1 in the morning and 2 at night    . carvedilol (COREG) 25 MG tablet TAKE 1/2 TABLET BY MOUTH TWICE A DAY 180 tablet 1  . Cholecalciferol (VITAMIN D3) 2000 UNITS TABS Take 1 tablet by mouth daily with lunch.     . Cyanocobalamin (VITAMIN B 12 PO) Take 1 tablet by mouth daily with lunch.    . ezetimibe-simvastatin (VYTORIN) 10-20 MG tablet TAKE 1 TABLET BY MOUTH EVERYDAY AT BEDTIME 90 tablet 3  . folic acid (FOLVITE) A999333 MCG tablet Take 400 mcg by mouth every morning.    . furosemide (LASIX) 40 MG tablet TAKE 1 TABLET BY MOUTH EVERY DAY 90 tablet 1  . gabapentin (NEURONTIN) 100 MG capsule Take 1 capsule (100 mg total) by mouth every 8 (eight) hours as needed (pain). 30 capsule 2  . losartan (COZAAR) 100 MG tablet TAKE 1 TABLET BY MOUTH EVERY DAY 90 tablet 1  . Magnesium 400 MG CAPS Take 400 mg by mouth at bedtime.     . Omega-3 Fatty Acids (FISH OIL) 500 MG CAPS Take 500 mg by mouth daily with lunch.      No current facility-administered medications on file prior to visit.   Review of Systems  Constitutional: Negative for other unusual diaphoresis or sweats HENT: Negative for ear discharge or swelling Eyes: Negative for other worsening visual disturbances Respiratory: Negative for stridor or other swelling  Gastrointestinal: Negative for worsening distension or other blood Genitourinary: Negative for retention or other urinary change Musculoskeletal: Negative for other MSK pain or swelling Skin: Negative for color change or other new lesions Neurological: Negative for worsening tremors and other numbness  Psychiatric/Behavioral: Negative for worsening agitation or other fatigue All otherwise neg per pt     Objective:   Physical Exam BP 112/76   Pulse 68   Temp 98 F (36.7 C)   Ht 5' (1.524 m)   Wt 166 lb (75.3 kg)   SpO2 97%   BMI 32.42 kg/m  VS noted,  Constitutional:  Pt appears in NAD HENT: Head: NCAT.  Right Ear: External ear normal.  Left Ear: External ear normal.  Eyes: . Pupils are equal, round, and reactive to light. Conjunctivae and EOM are normal Nose: without d/c or deformity Neck: Neck supple. Gross normal ROM Cardiovascular: Normal rate and regular rhythm.   Pulmonary/Chest: Effort normal and breath sounds without rales or wheezing.  Abd:  Soft, NT, ND, + BS, no organomegaly Neurological: Pt is alert. At baseline orientation, motor grossly intact Skin: Skin is warm. No rashes, other new lesions, no LE edema Psychiatric: Pt behavior is normal without agitation  All otherwise neg per pt Lab Results  Component Value Date   WBC 7.5 05/01/2019   HGB 12.8  05/01/2019   HCT 39.2 05/01/2019   PLT 224.0 05/01/2019   GLUCOSE 103 (H) 11/03/2019   CHOL 168 11/03/2019   TRIG 178.0 (H) 11/03/2019   HDL 40.70 11/03/2019   LDLCALC 91 11/03/2019   ALT 18 11/03/2019   AST 24 11/03/2019   NA 142 11/03/2019   K 3.6 11/03/2019   CL 103 11/03/2019   CREATININE 1.45 (H) 11/03/2019   BUN 21 11/03/2019   CO2 31 11/03/2019   TSH 1.87 05/01/2019   HGBA1C 5.9 11/03/2019   MICROALBUR 2.1 (H) 10/02/2010         Assessment & Plan:

## 2019-11-03 NOTE — Patient Instructions (Signed)
Please continue all other medications as before, and refills have been done if requested.  Please have the pharmacy call with any other refills you may need.  Please continue your efforts at being more active, low cholesterol diet, and weight control.  You are otherwise up to date with prevention measures today.  Please keep your appointments with your specialists as you may have planned  Please go to the LAB at the blood drawing area for the tests to be done  You will be contacted by phone if any changes need to be made immediately.  Otherwise, you will receive a letter about your results with an explanation, but please check with MyChart first.  Please remember to sign up for MyChart if you have not done so, as this will be important to you in the future with finding out test results, communicating by private email, and scheduling acute appointments online when needed.  Please return in 6 months, or sooner if needed, with Lab testing done 3-5 days before

## 2019-11-10 NOTE — Progress Notes (Signed)
PPM remote 

## 2019-11-11 ENCOUNTER — Other Ambulatory Visit: Payer: Self-pay

## 2019-11-11 MED ORDER — ESOMEPRAZOLE MAGNESIUM 40 MG PO CPDR: DELAYED_RELEASE_CAPSULE | ORAL | 0 refills | Status: DC

## 2019-11-11 NOTE — Telephone Encounter (Signed)
Esomeprazole refilled and marked that she needs to call for an appointment.

## 2019-11-15 NOTE — Assessment & Plan Note (Signed)
stable overall by history and exam, recent data reviewed with pt, and pt to continue medical treatment as before,  to f/u any worsening symptoms or concerns  

## 2019-11-15 NOTE — Assessment & Plan Note (Signed)
Mild, declines NCS or hand surgury referral for now unless worsens, due to pandemic

## 2019-11-15 NOTE — Addendum Note (Signed)
Addended by: Biagio Borg on: 11/15/2019 05:24 AM   Modules accepted: Orders

## 2019-12-04 ENCOUNTER — Other Ambulatory Visit: Payer: Self-pay | Admitting: Internal Medicine

## 2019-12-04 DIAGNOSIS — I10 Essential (primary) hypertension: Secondary | ICD-10-CM

## 2019-12-30 DIAGNOSIS — H43813 Vitreous degeneration, bilateral: Secondary | ICD-10-CM | POA: Diagnosis not present

## 2020-01-14 ENCOUNTER — Ambulatory Visit (INDEPENDENT_AMBULATORY_CARE_PROVIDER_SITE_OTHER): Payer: Medicare PPO | Admitting: *Deleted

## 2020-01-14 ENCOUNTER — Telehealth: Payer: Self-pay

## 2020-01-14 DIAGNOSIS — R001 Bradycardia, unspecified: Secondary | ICD-10-CM

## 2020-01-14 LAB — CUP PACEART REMOTE DEVICE CHECK
Battery Remaining Longevity: 52 mo
Battery Voltage: 2.99 V
Brady Statistic AP VP Percent: 30.93 %
Brady Statistic AP VS Percent: 0 %
Brady Statistic AS VP Percent: 69.07 %
Brady Statistic AS VS Percent: 0 %
Brady Statistic RA Percent Paced: 30.9 %
Brady Statistic RV Percent Paced: 99.99 %
Date Time Interrogation Session: 20210304120035
Implantable Lead Implant Date: 20170906
Implantable Lead Implant Date: 20170906
Implantable Lead Location: 753859
Implantable Lead Location: 753860
Implantable Lead Model: 3830
Implantable Lead Model: 5076
Implantable Pulse Generator Implant Date: 20170906
Lead Channel Impedance Value: 342 Ohm
Lead Channel Impedance Value: 418 Ohm
Lead Channel Impedance Value: 437 Ohm
Lead Channel Impedance Value: 513 Ohm
Lead Channel Pacing Threshold Amplitude: 0.5 V
Lead Channel Pacing Threshold Amplitude: 1.625 V
Lead Channel Pacing Threshold Pulse Width: 0.4 ms
Lead Channel Pacing Threshold Pulse Width: 0.4 ms
Lead Channel Sensing Intrinsic Amplitude: 1.875 mV
Lead Channel Sensing Intrinsic Amplitude: 1.875 mV
Lead Channel Sensing Intrinsic Amplitude: 5.25 mV
Lead Channel Sensing Intrinsic Amplitude: 5.25 mV
Lead Channel Setting Pacing Amplitude: 2 V
Lead Channel Setting Pacing Amplitude: 2.5 V
Lead Channel Setting Pacing Pulse Width: 1 ms
Lead Channel Setting Sensing Sensitivity: 0.9 mV

## 2020-01-14 NOTE — Telephone Encounter (Signed)
Pt needed some help sending a transmission. I helped her. Transmission received.

## 2020-01-15 NOTE — Progress Notes (Signed)
PPM Remote  

## 2020-01-19 ENCOUNTER — Other Ambulatory Visit: Payer: Self-pay | Admitting: Internal Medicine

## 2020-02-10 ENCOUNTER — Other Ambulatory Visit: Payer: Self-pay | Admitting: Internal Medicine

## 2020-02-10 NOTE — Telephone Encounter (Signed)
Please refill as per office routine med refill policy (all routine meds refilled for 3 mo or monthly per pt preference up to one year from last visit, then month to month grace period for 3 mo, then further med refills will have to be denied)  

## 2020-02-22 ENCOUNTER — Other Ambulatory Visit: Payer: Self-pay | Admitting: Internal Medicine

## 2020-03-07 ENCOUNTER — Telehealth: Payer: Self-pay

## 2020-03-07 NOTE — Telephone Encounter (Signed)
Ok with me 

## 2020-03-07 NOTE — Telephone Encounter (Signed)
New message    The patient wants to transfer services over to Dr. Yong Channel @ [place].  Due husband see Dr. Yong Channel and same location & she has no issues with Dr. Jenny Reichmann

## 2020-03-08 NOTE — Telephone Encounter (Signed)
Spoke with pt informed her that Dr. Jenny Reichmann states its ok with him to switch over providers.

## 2020-03-09 ENCOUNTER — Telehealth: Payer: Self-pay | Admitting: Internal Medicine

## 2020-03-09 NOTE — Telephone Encounter (Signed)
Ok with me 

## 2020-03-09 NOTE — Telephone Encounter (Signed)
Patient is wanting to become a patient with Dr.Hunter, states her husband Flynn, [place]ata [id number] is a current patient with him and thinks it will be best to have the same provider.

## 2020-03-09 NOTE — Telephone Encounter (Signed)
Yes thanks-fine with me as I am still accepting family members of current patients

## 2020-03-11 ENCOUNTER — Ambulatory Visit (INDEPENDENT_AMBULATORY_CARE_PROVIDER_SITE_OTHER): Payer: Medicare PPO

## 2020-03-11 DIAGNOSIS — Z0000 Encounter for general adult medical examination without abnormal findings: Secondary | ICD-10-CM

## 2020-03-11 NOTE — Progress Notes (Signed)
Subjective:   Kylie Patrick is a 84 y.o. female who presents for Medicare Annual (Subsequent) preventive examination.  Review of Systems:  No ROS Medicare Wellness Visit Cardiac Risk Factors include: advanced age (>49men, >4 women);dyslipidemia;family history of premature cardiovascular disease;hypertension  Sleep Patterns: No issues with falling asleep. Home Safety/Smoke Alarms: Feels safe in home; Smoke alarms in place. Living environment: Lives with her husband in a 1-story home; no need for DME; good support system. Seat Belt Safety/Bike Helmet: Wears seat belt.     Objective:    This visit is being conducted via phone call due to the COVID-19 pandemic. This patient has given me verbal consent via phone to conduct this visit, patient states they are participating from their home address. Some vital signs may be absent or patient reported.   Patient identification: identified by name, DOB, and current address.  Location provider: [facility], Office Persons participating in the virtual visit: Cathlean Cower, MD, Lisette Abu, LPN and patient    Vitals: There were no vitals taken for this visit.  There is no height or weight on file to calculate BMI.  Advanced Directives 03/11/2020 04/24/2017 07/17/2016 07/17/2016 07/08/2016 10/22/2013 10/19/2013  Does Patient Have a Medical Advance Directive? Yes No No No No Patient does not have advance directive;Patient would not like information Patient has advance directive, copy not in chart  Type of Advance Directive [place]  Does patient want to make changes to medical advance directive? No - Patient declined - - - - - -  Copy of [place] in Chart? No - copy requested - - - - - Copy requested from family  Would patient like information on creating a medical advance directive? - - No - patient declined information No - patient declined information - - -   Pre-existing out of facility DNR order (yellow form or pink MOST form) - - - - - No No    Tobacco Social History   Tobacco Use  Smoking Status Never Smoker  Smokeless Tobacco Never Used     Counseling given: Not Answered   Clinical Intake:  Pre-visit preparation completed: Yes  Pain : 0-10 Pain Score: 5  Pain Type: Acute pain Pain Location: Back Pain Orientation: Lower Pain Radiating Towards: hips Pain Descriptors / Indicators: Discomfort Pain Onset: Today Pain Frequency: Occasional Effect of Pain on Daily Activities: pain comes if she is active such as cleaning the house, etc.     Nutritional Risks: None Diabetes: No  How often do you need to have someone help you when you read instructions, pamphlets, or other written materials from your doctor or pharmacy?: 1 - Never What is the last grade level you completed in school?: 4 years of college  Interpreter Needed?: No  Information entered by :: Kentrell Hallahan N. Lowell Guitar, LPN  Past Medical History:  Diagnosis Date  . Anxiety   . Colon polyp 2003  . Complication of anesthesia    slow to wake up one time  . Gastroenteritis    w/ renal insufficiency in context of protracted nausea & vomitting 2006  . GERD (gastroesophageal reflux disease)   . H/O hiatal hernia   . Headache(784.0)    occasional  . Hyperlipidemia   . Hypertension    w/ LVH on ECHO  . Pacemaker 07/2016  . Tonsillitis 2006   Past Surgical History:  Procedure Laterality Date  . ABDOMINAL HYSTERECTOMY  1976  BSO for endometriosis and bengin tumor  . APPENDECTOMY  1970  . BREAST LUMPECTOMY Left 70's   X 2  . BUNIONECTOMY Bilateral   . CATARACT EXTRACTION W/PHACO Left 05/14/2013   Procedure: CATARACT EXTRACTION PHACO AND INTRAOCULAR LENS PLACEMENT (IOC);  Surgeon: Tonny Branch, MD;  Location: [facility];  Service: Ophthalmology;  Laterality: Left;  CDE:9.76  . CATARACT EXTRACTION W/PHACO Right 06/08/2013   Procedure: CATARACT EXTRACTION PHACO AND  INTRAOCULAR LENS PLACEMENT (IOC);  Surgeon: Tonny Branch, MD;  Location: [facility];  Service: Ophthalmology;  Laterality: Right;  CDE 13.65  . CHOLECYSTECTOMY  1970  . COLONOSCOPY W/ POLYPECTOMY  2003   Dr [name]  . EP IMPLANTABLE DEVICE N/A 07/18/2016   Procedure: Pacemaker Implant;  Surgeon: Deboraha Sprang, MD;  Location: [facility];  Service: Cardiovascular;  Laterality: N/A;  . ESOPHAGEAL MANOMETRY N/A 10/05/2013   Procedure: ESOPHAGEAL MANOMETRY (EM);  Surgeon: Gatha Mayer, MD;  Location: [facility];  Service: Endoscopy;  Laterality: N/A;  . ESOPHAGOGASTRODUODENOSCOPY  02/05/13   Large Hiatal Hernia  . HERNIA REPAIR    . LAPAROSCOPIC NISSEN FUNDOPLICATION Bilateral [id number]   Procedure: LAPAROSCOPIC NISSEN FUNDOPLICATION with hiatal hernia repair ;  Surgeon: Edward Jolly, MD;  Location: [facility];  Service: General;  Laterality: Bilateral;   Family History  Problem Relation Age of Onset  . Heart attack Father 35       fatal MI @ 26  . Coronary artery disease Mother        CABG; MI @ 33  . Diabetes Mother   . Stroke Paternal Grandfather        > 44  . Stroke Maternal Grandmother        in 32s  . Aneurysm Paternal Grandmother        cns ; in 31s  . Diabetes Maternal Aunt   . Stomach cancer Maternal Aunt   . Breast cancer Maternal Aunt   . Heart disease Sister   . Coronary artery disease Maternal Grandfather   . Colon cancer Neg Hx   . Esophageal cancer Neg Hx    Social History   Socioeconomic History  . Marital status: Married    Spouse name: Not on file  . Number of children: 2  . Years of education: Not on file  . Highest education level: Not on file  Occupational History  . Occupation: Retired    Fish farm manager: RETIRED  Tobacco Use  . Smoking status: Never Smoker  . Smokeless tobacco: Never Used  Substance and Sexual Activity  . Alcohol use: Yes    Comment: Wine-VERY RARELY  . Drug use: No  . Sexual activity: Not Currently  Other Topics Concern  .  Not on file  Social History Narrative  . Not on file   Social Determinants of Health   Financial Resource Strain:   . Difficulty of Paying Living Expenses:   Food Insecurity:   . Worried About Charity fundraiser in the Last Year:   . Arboriculturist in the Last Year:   Transportation Needs:   . Film/video editor (Medical):   Marland Kitchen Lack of Transportation (Non-Medical):   Physical Activity:   . Days of Exercise per Week:   . Minutes of Exercise per Session:   Stress:   . Feeling of Stress :   Social Connections:   . Frequency of Communication with Friends and Family:   . Frequency of Social Gatherings with Friends and Family:   .  Attends Religious Services:   . Active Member of Clubs or Organizations:   . Attends Archivist Meetings:   Marland Kitchen Marital Status:     Outpatient Encounter Medications as of 03/11/2020  Medication Sig  . ALPRAZolam (XANAX) 0.25 MG tablet TAKE 1 TABLET BY MOUTH 2 TIMES DAILY AS NEEDED FOR ANXIETY.  Marland Kitchen aspirin 81 MG tablet Take 81 mg by mouth at bedtime.   . Calcium in Bone Mineral Cmplx 350 MG MISC Take 3 each by mouth at bedtime. Takes 1 in the morning and 2 at night  . carvedilol (COREG) 25 MG tablet Take 0.5 tablets (12.5 mg total) by mouth 2 (two) times daily. Annual appt due in June must see provider for future refills  . Cholecalciferol (VITAMIN D3) 2000 UNITS TABS Take 1 tablet by mouth daily with lunch.   . Cyanocobalamin (VITAMIN B 12 PO) Take 1 tablet by mouth daily with lunch.  . esomeprazole (NEXIUM) 40 MG capsule TAKE 1 CAPSULE BY MOUTH TWICE A DAY BEFORE MEALS  . ezetimibe-simvastatin (VYTORIN) 10-20 MG tablet TAKE 1 TABLET BY MOUTH EVERYDAY AT BEDTIME  . folic acid (FOLVITE) A999333 MCG tablet Take 400 mcg by mouth every morning.  . furosemide (LASIX) 40 MG tablet Take 1 tablet (40 mg total) by mouth daily. Annual appt due in June must see provider for future refills  . gabapentin (NEURONTIN) 100 MG capsule Take 1 capsule (100 mg total)  by mouth every 8 (eight) hours as needed (pain).  Marland Kitchen losartan (COZAAR) 100 MG tablet TAKE 1 TABLET BY MOUTH EVERY DAY  . Magnesium 400 MG CAPS Take 400 mg by mouth at bedtime.   . Omega-3 Fatty Acids (FISH OIL) 500 MG CAPS Take 500 mg by mouth daily with lunch.    No facility-administered encounter medications on file as of 03/11/2020.    Activities of Daily Living In your present state of health, do you have any difficulty performing the following activities: 03/11/2020  Hearing? N  Vision? N  Difficulty concentrating or making decisions? N  Walking or climbing stairs? N  Dressing or bathing? N  Doing errands, shopping? N  Preparing Food and eating ? N  Using the Toilet? N  In the past six months, have you accidently leaked urine? N  Do you have problems with loss of bowel control? N  Managing your Medications? N  Managing your Finances? N  Housekeeping or managing your Housekeeping? N  Some recent data might be hidden    Patient Care Team: Biagio Borg, MD as PCP - General (Internal Medicine)    Assessment:   This is a routine wellness examination for Gilbert.  Exercise Activities and Dietary recommendations Current Exercise Habits: The patient does not participate in regular exercise at present, Exercise limited by: None identified  Goals   None     Fall Risk Fall Risk  03/11/2020 11/03/2019 05/01/2019 04/24/2018 10/24/2017  Falls in the past year? 0 - 0 No No  Number falls in past yr: 0 0 - - -  Injury with Fall? 0 0 - - -  Risk for fall due to : No Fall Risks - - - -  Follow up Falls evaluation completed;Education provided - - - -   Is the patient's home free of loose throw rugs in walkways, pet beds, electrical cords, etc?   yes      Grab bars in the bathroom? no      Handrails on the stairs?   no  Adequate lighting?   yes  Depression Screen PHQ 2/9 Scores 03/11/2020 11/03/2019 05/01/2019 04/24/2018  PHQ - 2 Score 0 0 1 0  PHQ- 9 Score - - - -     Cognitive  Function     6CIT Screen 03/11/2020  What Year? 0 points  What month? 0 points  What time? 0 points  Count back from 20 0 points  Months in reverse 0 points  Repeat phrase 0 points  Total Score 0    Immunization History  Administered Date(s) Administered  . Influenza Split 10/08/2011  . Influenza Whole 09/06/2009, 10/02/2010  . Influenza, High Dose Seasonal PF 09/10/2013, 10/10/2015, 10/10/2016, 10/24/2017, 07/28/2019  . Influenza,inj,Quad PF,6+ Mos 10/04/2014  . Influenza-Unspecified 08/18/2018  . Pneumococcal Conjugate-13 10/25/2015  . Pneumococcal Polysaccharide-23 10/10/2010  . Tdap 10/10/2016  . Zoster 01/09/2013  . Zoster Recombinat (Shingrix) 09/13/2019    Qualifies for Shingles Vaccine? Yes  Screening Tests Health Maintenance  Topic Date Due  . COVID-19 Vaccine (1) Never done  . INFLUENZA VACCINE  06/12/2020  . TETANUS/TDAP  11/07/2026  . DEXA SCAN  Completed  . PNA vac Low Risk Adult  Completed    Cancer Screenings: Lung: Low Dose CT Chest recommended if Age 64-80 years, 30 pack-year currently smoking OR have quit w/in 15years. Patient does not qualify. Breast:  Up to date on Mammogram? No   Up to date of Bone Density/Dexa? No Colorectal: Yes; not recommended due to age     Plan:     Reviewed health maintenance screenings with patient today and relevant education, vaccines, and/or referrals were provided.    Continue doing brain stimulating activities (puzzles, reading, adult coloring books, staying active) to keep memory sharp.    Continue to eat heart healthy diet (full of fruits, vegetables, whole grains, lean protein, water--limit salt, fat, and sugar intake) and increase physical activity as tolerated.  I have personally reviewed and noted the following in the patient's chart:   . Medical and social history . Use of alcohol, tobacco or illicit drugs  . Current medications and supplements . Functional ability and status . Nutritional status .  Physical activity . Advanced directives . List of other physicians . Hospitalizations, surgeries, and ER visits in previous 12 months . Vitals . Screenings to include cognitive, depression, and falls . Referrals and appointments  In addition, I have reviewed and discussed with patient certain preventive protocols, quality metrics, and best practice recommendations. A written personalized care plan for preventive services as well as general preventive health recommendations were provided to patient.     Sheral Flow, LPN  [id number]  Nurse Health Advisor   Nurse Notes:  There were no vitals filed for this visit. There is no height or weight on file to calculate BMI.

## 2020-03-11 NOTE — Patient Instructions (Addendum)
Kylie Patrick , Thank you for taking time to come for your Medicare Wellness Visit. I appreciate your ongoing commitment to your health goals. Please review the following plan we discussed and let me know if I can assist you in the future.   Screening recommendations/referrals: Colorectal Screening: 03/20/2013 Mammogram: will schedule Bone Density: 10/20/2014  Vision and Dental Exams: Recommended annual ophthalmology exams for early detection of glaucoma and other disorders of the eye Recommended annual dental exams for proper oral hygiene  Vaccinations: Influenza vaccine: 07/18/2019 Pneumococcal vaccine: completed Tdap vaccine: 10/10/2016 Shingles vaccine: 09/13/2019; Please call your insurance company to determine your out of pocket expense for the Shingrix vaccine. You may receive this vaccine at your local pharmacy. Covid vaccine: completed; please bring immunization card to next office visit.  Advanced directives: Please bring a copy of your POA (Power of Attorney) and/or Living Will to your next appointment.  Goals: Recommend to drink at least 6-8 8oz glasses of water per day.  Recommend to exercise for at least 150 minutes per week.  Recommend to remove any items from the home that may cause slips or trips.  Recommend to decrease portion sizes by eating 3 small healthy meals and at least 2 healthy snacks per day.  Recommend to begin DASH diet as directed below  Recommend to continue efforts to reduce smoking habits until no longer smoking. Smoking Cessation literature is attached below.  Next appointment: Please schedule your Annual Wellness Visit with your Nurse Health Advisor in one year.  Preventive Care 42 Years and Older, Female Preventive care refers to lifestyle choices and visits with your health care provider that can promote health and wellness. What does preventive care include?  A yearly physical exam. This is also called an annual well check.  Dental exams once or  twice a year.  Routine eye exams. Ask your health care provider how often you should have your eyes checked.  Personal lifestyle choices, including:  Daily care of your teeth and gums.  Regular physical activity.  Eating a healthy diet.  Avoiding tobacco and drug use.  Limiting alcohol use.  Practicing safe sex.  Taking low-dose aspirin every day if recommended by your health care provider.  Taking vitamin and mineral supplements as recommended by your health care provider. What happens during an annual well check? The services and screenings done by your health care provider during your annual well check will depend on your age, overall health, lifestyle risk factors, and family history of disease. Counseling  Your health care provider may ask you questions about your:  Alcohol use.  Tobacco use.  Drug use.  Emotional well-being.  Home and relationship well-being.  Sexual activity.  Eating habits.  History of falls.  Memory and ability to understand (cognition).  Work and work Statistician.  Reproductive health. Screening  You may have the following tests or measurements:  Height, weight, and BMI.  Blood pressure.  Lipid and cholesterol levels. These may be checked every 5 years, or more frequently if you are over 21 years old.  Skin check.  Lung cancer screening. You may have this screening every year starting at age 75 if you have a 30-pack-year history of smoking and currently smoke or have quit within the past 15 years.  Fecal occult blood test (FOBT) of the stool. You may have this test every year starting at age 89.  Flexible sigmoidoscopy or colonoscopy. You may have a sigmoidoscopy every 5 years or a colonoscopy every 10 years starting  at age 62.  Hepatitis C blood test.  Hepatitis B blood test.  Sexually transmitted disease (STD) testing.  Diabetes screening. This is done by checking your blood sugar (glucose) after you have not eaten for  a while (fasting). You may have this done every 1-3 years.  Bone density scan. This is done to screen for osteoporosis. You may have this done starting at age 18.  Mammogram. This may be done every 1-2 years. Talk to your health care provider about how often you should have regular mammograms. Talk with your health care provider about your test results, treatment options, and if necessary, the need for more tests. Vaccines  Your health care provider may recommend certain vaccines, such as:  Influenza vaccine. This is recommended every year.  Tetanus, diphtheria, and acellular pertussis (Tdap, Td) vaccine. You may need a Td booster every 10 years.  Zoster vaccine. You may need this after age 29.  Pneumococcal 13-valent conjugate (PCV13) vaccine. One dose is recommended after age 65.  Pneumococcal polysaccharide (PPSV23) vaccine. One dose is recommended after age 60. Talk to your health care provider about which screenings and vaccines you need and how often you need them. This information is not intended to replace advice given to you by your health care provider. Make sure you discuss any questions you have with your health care provider. Document Released: 11/25/2015 Document Revised: 07/18/2016 Document Reviewed: 08/30/2015 Elsevier Interactive Patient Education  2017 [place] Prevention in the Home Falls can cause injuries. They can happen to people of all ages. There are many things you can do to make your home safe and to help prevent falls. What can I do on the outside of my home?  Regularly fix the edges of walkways and driveways and fix any cracks.  Remove anything that might make you trip as you walk through a door, such as a raised step or threshold.  Trim any bushes or trees on the path to your home.  Use bright outdoor lighting.  Clear any walking paths of anything that might make someone trip, such as rocks or tools.  Regularly check to see if handrails  are loose or broken. Make sure that both sides of any steps have handrails.  Any raised decks and porches should have guardrails on the edges.  Have any leaves, snow, or ice cleared regularly.  Use sand or salt on walking paths during winter.  Clean up any spills in your garage right away. This includes oil or grease spills. What can I do in the bathroom?  Use night lights.  Install grab bars by the toilet and in the tub and shower. Do not use towel bars as grab bars.  Use non-skid mats or decals in the tub or shower.  If you need to sit down in the shower, use a plastic, non-slip stool.  Keep the floor dry. Clean up any water that spills on the floor as soon as it happens.  Remove soap buildup in the tub or shower regularly.  Attach bath mats securely with double-sided non-slip rug tape.  Do not have throw rugs and other things on the floor that can make you trip. What can I do in the bedroom?  Use night lights.  Make sure that you have a light by your bed that is easy to reach.  Do not use any sheets or blankets that are too big for your bed. They should not hang down onto the floor.  Have a firm  chair that has side arms. You can use this for support while you get dressed.  Do not have throw rugs and other things on the floor that can make you trip. What can I do in the kitchen?  Clean up any spills right away.  Avoid walking on wet floors.  Keep items that you use a lot in easy-to-reach places.  If you need to reach something above you, use a strong step stool that has a grab bar.  Keep electrical cords out of the way.  Do not use floor polish or wax that makes floors slippery. If you must use wax, use non-skid floor wax.  Do not have throw rugs and other things on the floor that can make you trip. What can I do with my stairs?  Do not leave any items on the stairs.  Make sure that there are handrails on both sides of the stairs and use them. Fix handrails  that are broken or loose. Make sure that handrails are as long as the stairways.  Check any carpeting to make sure that it is firmly attached to the stairs. Fix any carpet that is loose or worn.  Avoid having throw rugs at the top or bottom of the stairs. If you do have throw rugs, attach them to the floor with carpet tape.  Make sure that you have a light switch at the top of the stairs and the bottom of the stairs. If you do not have them, ask someone to add them for you. What else can I do to help prevent falls?  Wear shoes that:  Do not have high heels.  Have rubber bottoms.  Are comfortable and fit you well.  Are closed at the toe. Do not wear sandals.  If you use a stepladder:  Make sure that it is fully opened. Do not climb a closed stepladder.  Make sure that both sides of the stepladder are locked into place.  Ask someone to hold it for you, if possible.  Clearly mark and make sure that you can see:  Any grab bars or handrails.  First and last steps.  Where the edge of each step is.  Use tools that help you move around (mobility aids) if they are needed. These include:  Canes.  Walkers.  Scooters.  Crutches.  Turn on the lights when you go into a dark area. Replace any light bulbs as soon as they burn out.  Set up your furniture so you have a clear path. Avoid moving your furniture around.  If any of your floors are uneven, fix them.  If there are any pets around you, be aware of where they are.  Review your medicines with your doctor. Some medicines can make you feel dizzy. This can increase your chance of falling. Ask your doctor what other things that you can do to help prevent falls. This information is not intended to replace advice given to you by your health care provider. Make sure you discuss any questions you have with your health care provider. Document Released: 08/25/2009 Document Revised: 04/05/2016 Document Reviewed: 12/03/2014 Elsevier  Interactive Patient Education  2017 [employer].

## 2020-04-04 NOTE — Progress Notes (Signed)
Phone: [phone]   Subjective:  Patient presents today to establish care.  Prior patient of Dr. Jenny Reichmann.  Chief Complaint  Patient presents with  . Transitions Of Care   See problem oriented charting  The following were reviewed and entered/updated in epic: Past Medical History:  Diagnosis Date  . Anxiety   . Colon polyp 2003  . Complication of anesthesia    slow to wake up one time  . Gastroenteritis    w/ renal insufficiency in context of protracted nausea & vomitting 2006  . GERD (gastroesophageal reflux disease)   . H/O hiatal hernia   . Headache(784.0)    occasional lifelong- gabapentin helps a  . Hyperlipidemia   . Hypertension    w/ LVH on ECHO  . Pacemaker 07/2016  . Tonsillitis 2006   Patient Active Problem List   Diagnosis Date Noted  . Complete heart block ([place]) 04/05/2020    Priority: High  . Right carpal tunnel syndrome 05/02/2019    Priority: Medium  . Diastolic dysfunction 123456    Priority: Medium  . Hyperglycemia 10/09/2014    Priority: Medium  . GERD (gastroesophageal reflux disease) 10/22/2013    Priority: Medium  . CKD (chronic kidney disease) stage 3, GFR 30-59 ml/min (HCC) 05/05/2008    Priority: Medium  . Anxiety state 04/20/2008    Priority: Medium  . HYPERLIPIDEMIA 08/12/2007    Priority: Medium  . Essential hypertension 08/12/2007    Priority: Medium  . Rash 07/04/2017    Priority: Low  . Hiatal hernia 08/03/2013    Priority: Low  . Personal history of colonic polyps 01/09/2013    Priority: Low  . NIGHT SWEATS 02/19/2008    Priority: Low   Past Surgical History:  Procedure Laterality Date  . ABDOMINAL HYSTERECTOMY  1976   BSO for endometriosis and bengin tumor  . APPENDECTOMY  1970  . BREAST LUMPECTOMY Left 70's   X 2  . BUNIONECTOMY Bilateral   . CATARACT EXTRACTION W/PHACO Left 05/14/2013   Procedure: CATARACT EXTRACTION PHACO AND INTRAOCULAR LENS PLACEMENT (IOC);  Surgeon: Tonny Branch, MD;  Location: [facility];  Service:  Ophthalmology;  Laterality: Left;  CDE:9.76  . CATARACT EXTRACTION W/PHACO Right 06/08/2013   Procedure: CATARACT EXTRACTION PHACO AND INTRAOCULAR LENS PLACEMENT (IOC);  Surgeon: Tonny Branch, MD;  Location: [facility];  Service: Ophthalmology;  Laterality: Right;  CDE 13.65  . CHOLECYSTECTOMY  1970  . COLONOSCOPY W/ POLYPECTOMY  2003   Dr [name]  . EP IMPLANTABLE DEVICE N/A 07/18/2016   Procedure: Pacemaker Implant;  Surgeon: Deboraha Sprang, MD;  Location: [place] [facility];  Service: Cardiovascular;  Laterality: N/A;  . ESOPHAGEAL MANOMETRY N/A 10/05/2013   Procedure: ESOPHAGEAL MANOMETRY (EM);  Surgeon: Gatha Mayer, MD;  Location: [facility];  Service: Endoscopy;  Laterality: N/A;  . ESOPHAGOGASTRODUODENOSCOPY  02/05/13   Large Hiatal Hernia  . HERNIA REPAIR    . LAPAROSCOPIC NISSEN FUNDOPLICATION Bilateral [id number]   Procedure: LAPAROSCOPIC NISSEN FUNDOPLICATION with hiatal hernia repair ;  Surgeon: Edward Jolly, MD;  Location: [facility];  Service: General;  Laterality: Bilateral;    Family History  Problem Relation Age of Onset  . Heart attack Father 15       fatal MI @ 65  . Coronary artery disease Mother        CABG; MI @ 57  . Diabetes Mother   . Stroke Paternal Grandfather        > 110  . Stroke Maternal Grandmother  in 56s  . Aneurysm Paternal Grandmother        cns ; in 31s  . Diabetes Maternal Aunt   . Stomach cancer Maternal Aunt   . Breast cancer Maternal Aunt   . Heart disease Sister   . Coronary artery disease Maternal Grandfather   . Colon cancer Neg Hx   . Esophageal cancer Neg Hx     Medications- reviewed and updated Current Outpatient Medications  Medication Sig Dispense Refill  . ALPRAZolam (XANAX) 0.25 MG tablet TAKE 1 TABLET BY MOUTH 2 TIMES DAILY AS NEEDED FOR ANXIETY. 30 tablet 2  . aspirin 81 MG tablet Take 81 mg by mouth at bedtime.     . Calcium in Bone Mineral Cmplx 350 MG MISC Take 3 each by mouth at bedtime. Takes 1 in the morning and  2 at night    . carvedilol (COREG) 25 MG tablet Take 0.5 tablets (12.5 mg total) by mouth 2 (two) times daily. Annual appt due in June must see provider for future refills 180 tablet 0  . Cholecalciferol (VITAMIN D3) 2000 UNITS TABS Take 1 tablet by mouth daily with lunch.     . Cyanocobalamin (VITAMIN B 12 PO) Take 1 tablet by mouth daily with lunch.    . esomeprazole (NEXIUM) 40 MG capsule TAKE 1 CAPSULE BY MOUTH TWICE A DAY BEFORE MEALS 180 capsule 0  . ezetimibe-simvastatin (VYTORIN) 10-20 MG tablet TAKE 1 TABLET BY MOUTH EVERYDAY AT BEDTIME 90 tablet 1  . folic acid (FOLVITE) A999333 MCG tablet Take 400 mcg by mouth every morning.    . furosemide (LASIX) 40 MG tablet Take 1 tablet (40 mg total) by mouth daily. Annual appt due in June must see provider for future refills 90 tablet 0  . gabapentin (NEURONTIN) 100 MG capsule Take 1 capsule (100 mg total) by mouth every 8 (eight) hours as needed (pain). 30 capsule 2  . losartan (COZAAR) 100 MG tablet TAKE 1 TABLET BY MOUTH EVERY DAY 90 tablet 1  . Magnesium 400 MG CAPS Take 400 mg by mouth at bedtime.     . Omega-3 Fatty Acids (FISH OIL) 500 MG CAPS Take 500 mg by mouth daily with lunch.      No current facility-administered medications for this visit.    Allergies-reviewed and updated Allergies  Allergen Reactions  . Chlorthalidone     01/09/13 creatinine 2.2  . Hydrochlorothiazide     Hypokalemia  . Losartan Potassium-Hctz     Hypokalemia  . Norvasc [Amlodipine Besylate]     Edema  . Raloxifene     HTN  . Rofecoxib     HTN    Social History   Social History Narrative   Married 19 years in February 17, 2020. Married to 1st husband 21 years but died of massive MI. In 17-Feb-2020- Derrick at 45 and lost Louie Casa at age 25 to cancer- lung start.    Husband Jimmy patient of Dr. Yong Channel.       Worked for [employer] for 24 years.       Hobbies: has a friend she spends time with 1 day a week and goes out- shopping, eating, or even gambling     Objective  Objective:  BP 116/80   Pulse 65   Temp 98.2 F (36.8 C) (Temporal)   Ht 5' (1.524 m)   Wt 167 lb 6.4 oz (75.9 kg)   SpO2 94%   BMI 32.69 kg/m  Gen: NAD, resting comfortably HEENT: Mucous membranes are  moist. Oropharynx normal. TM normal. Eyes: sclera and lids normal, PERRLA Neck: no thyromegaly, no cervical lymphadenopathy CV: RRR no murmurs rubs or gallops Lungs: CTAB no crackles, wheeze, rhonchi Abdomen: soft/nontender/nondistended/normal bowel sounds. No rebound or guarding.  Overweight Ext: no edema Skin: warm, dry Neuro: 5/5 strength in upper and lower extremities, normal gait, normal reflexes    Assessment and Plan:   #hypertension S: medication: Carvedilol 25 mg BID , Lasix 40mg  daily, losartan 100mg  Home readings #s: 116/75 BP Readings from Last 3 Encounters:  04/05/20 116/80  11/03/19 112/76  08/11/19 128/80  A/P: Stable. Continue current medications.   # GERD- follows with Dr. [name] -s/p hiatal hernia surgery- bad experience S: Nexium 40mg  BID. Still gets breakthrough at times- burning in chest and almost chokes her when severe if laying down B12 levels related to PPI use: takes b12 Lab Results  Component Value Date   VITAMINB12 663 05/01/2019  A/P: occasional breakthrough reflux symptoms-he plans to schedule follow-up with Dr. [name]  #hyperlipidemia S: Medication: omega 3, Vytorin 10-20mg , aspirin 81mg - no bleeding history Lab Results  Component Value Date   CHOL 168 11/03/2019   HDL 40.70 11/03/2019   LDLCALC 91 11/03/2019   TRIG 178.0 (H) 11/03/2019   CHOLHDL 4 11/03/2019   A/P: mild poor control but at her age I do not feel we should increase dose of medicine -very strong family history of coronary artery disease-we opted to continue current medication  # Anxiety S:Medication:  Xanax once a month or so- very sparing . States sometimes gets to point where she feels like she may jump out of her skin with her husband-medicine  really helps- states husband can be "hateful" at times A/P: reasonable control and very sparing use- continue current medications  #severe headaches years ago- was given gabapentin by Dr. Linna Darner after multiple other medication trials and it did improve symptoms. Has had headaches her entire life. Gabapentin 100mg  every 8 months perhaps  # Hyperglycemia/insulin resistance/prediabetes- peak a1c June 2020 at 6.4 S:  Medication: None Exercise and diet-exercise limited-she has improved her diet to bring A1c down on last check Lab Results  Component Value Date   HGBA1C 5.9 11/03/2019   HGBA1C 6.4 05/01/2019   HGBA1C 6.0 10/24/2017   A/P: Hopefully A1c remains 6 or less-update A1c when she comes back for labs  # osteopenia S: -1.7 T score at left femoral neck in 2015.  Patient is compliant with calcium and vitamin D. Dr. Linna Darner almost wanted her on magnesium -We discussed the importance of weightbearing exercise- she is not doing that right now  A/P: Hopefully stable-update bone density.  Continue calcium and vitamin D for now  # Chronic kidney disease Stage III-follows with Dr. Moshe Cipro S: knows to avoid nsaids.  A/P: Blood pressure control and on statin-continue risk factor modification  % complete heart block- Led to pacemaker placement - follows with Dr. Caryl Comes yearly. q3 month checks at home    Recommended follow up: Return in about 6 months (around 10/06/2020) for physical or sooner if needed. Future Appointments  Date Time Provider [place]  04/14/2020  8:40 AM [facility] DEVICE REMOTES [facility]  07/14/2020  8:40 AM [facility] DEVICE REMOTES [facility]  10/13/2020  8:40 AM [facility] DEVICE [facility]OTES [facility]   Time Spent: 44 minutes of total time (4:40 PM- 5:24 PM) was spent on the date of the encounter performing the following actions: chart review prior to seeing the patient, obtaining history, performing a medically necessary  exam, counseling on the treatment plan, placing orders, and documenting in our EHR.   Return precautions advised. Garret Reddish, MD

## 2020-04-04 NOTE — Patient Instructions (Addendum)
Schedule your bone density test at check out desk. You may also call directly to X-ray at [phone] to schedule an appointment that is convenient for you.  - located [address] across the street from [place] - in the basement - you do need an appointment for the bone density tests.   Call Dr. [name] for follow-up please  Schedule a lab visit at the check out desk within 2 weeks. Return for future fasting labs meaning nothing but water after midnight please. Ok to take your medications with water.   Return in about 6 months (around 10/06/2020) for physical or sooner if needed.

## 2020-04-05 ENCOUNTER — Encounter: Payer: Self-pay | Admitting: Family Medicine

## 2020-04-05 ENCOUNTER — Other Ambulatory Visit: Payer: Self-pay

## 2020-04-05 ENCOUNTER — Ambulatory Visit (INDEPENDENT_AMBULATORY_CARE_PROVIDER_SITE_OTHER): Payer: Medicare PPO | Admitting: Family Medicine

## 2020-04-05 VITALS — BP 116/80 | HR 65 | Temp 98.2°F | Ht 60.0 in | Wt 167.4 lb

## 2020-04-05 DIAGNOSIS — K219 Gastro-esophageal reflux disease without esophagitis: Secondary | ICD-10-CM

## 2020-04-05 DIAGNOSIS — G5601 Carpal tunnel syndrome, right upper limb: Secondary | ICD-10-CM | POA: Diagnosis not present

## 2020-04-05 DIAGNOSIS — K449 Diaphragmatic hernia without obstruction or gangrene: Secondary | ICD-10-CM

## 2020-04-05 DIAGNOSIS — M85852 Other specified disorders of bone density and structure, left thigh: Secondary | ICD-10-CM | POA: Diagnosis not present

## 2020-04-05 DIAGNOSIS — N183 Chronic kidney disease, stage 3 unspecified: Secondary | ICD-10-CM | POA: Diagnosis not present

## 2020-04-05 DIAGNOSIS — Z79899 Other long term (current) drug therapy: Secondary | ICD-10-CM

## 2020-04-05 DIAGNOSIS — F411 Generalized anxiety disorder: Secondary | ICD-10-CM

## 2020-04-05 DIAGNOSIS — Z860100 Personal history of colon polyps, unspecified: Secondary | ICD-10-CM

## 2020-04-05 DIAGNOSIS — I442 Atrioventricular block, complete: Secondary | ICD-10-CM

## 2020-04-05 DIAGNOSIS — R739 Hyperglycemia, unspecified: Secondary | ICD-10-CM

## 2020-04-05 DIAGNOSIS — R55 Syncope and collapse: Secondary | ICD-10-CM | POA: Diagnosis not present

## 2020-04-05 DIAGNOSIS — E782 Mixed hyperlipidemia: Secondary | ICD-10-CM

## 2020-04-05 DIAGNOSIS — R61 Generalized hyperhidrosis: Secondary | ICD-10-CM | POA: Diagnosis not present

## 2020-04-14 ENCOUNTER — Ambulatory Visit (INDEPENDENT_AMBULATORY_CARE_PROVIDER_SITE_OTHER): Payer: Medicare PPO | Admitting: *Deleted

## 2020-04-14 DIAGNOSIS — I442 Atrioventricular block, complete: Secondary | ICD-10-CM

## 2020-04-14 LAB — CUP PACEART REMOTE DEVICE CHECK
Battery Remaining Longevity: 44 mo
Battery Voltage: 2.98 V
Brady Statistic AP VP Percent: 19.63 %
Brady Statistic AP VS Percent: 0 %
Brady Statistic AS VP Percent: 80.37 %
Brady Statistic AS VS Percent: 0 %
Brady Statistic RA Percent Paced: 19.61 %
Brady Statistic RV Percent Paced: 99.99 %
Date Time Interrogation Session: 20210603093316
Implantable Lead Implant Date: 20170906
Implantable Lead Implant Date: 20170906
Implantable Lead Location: 753859
Implantable Lead Location: 753860
Implantable Lead Model: 3830
Implantable Lead Model: 5076
Implantable Pulse Generator Implant Date: 20170906
Lead Channel Impedance Value: 304 Ohm
Lead Channel Impedance Value: 399 Ohm
Lead Channel Impedance Value: 437 Ohm
Lead Channel Impedance Value: 513 Ohm
Lead Channel Pacing Threshold Amplitude: 0.5 V
Lead Channel Pacing Threshold Amplitude: 2 V
Lead Channel Pacing Threshold Pulse Width: 0.4 ms
Lead Channel Pacing Threshold Pulse Width: 0.4 ms
Lead Channel Sensing Intrinsic Amplitude: 2 mV
Lead Channel Sensing Intrinsic Amplitude: 2 mV
Lead Channel Sensing Intrinsic Amplitude: 5.25 mV
Lead Channel Sensing Intrinsic Amplitude: 5.25 mV
Lead Channel Setting Pacing Amplitude: 2 V
Lead Channel Setting Pacing Amplitude: 2.5 V
Lead Channel Setting Pacing Pulse Width: 1 ms
Lead Channel Setting Sensing Sensitivity: 0.9 mV

## 2020-04-15 ENCOUNTER — Other Ambulatory Visit: Payer: Self-pay | Admitting: Family Medicine

## 2020-04-15 DIAGNOSIS — I10 Essential (primary) hypertension: Secondary | ICD-10-CM

## 2020-04-18 ENCOUNTER — Other Ambulatory Visit (INDEPENDENT_AMBULATORY_CARE_PROVIDER_SITE_OTHER): Payer: Medicare PPO

## 2020-04-18 ENCOUNTER — Other Ambulatory Visit: Payer: Self-pay

## 2020-04-18 DIAGNOSIS — Z79899 Other long term (current) drug therapy: Secondary | ICD-10-CM

## 2020-04-18 DIAGNOSIS — E782 Mixed hyperlipidemia: Secondary | ICD-10-CM

## 2020-04-18 DIAGNOSIS — R739 Hyperglycemia, unspecified: Secondary | ICD-10-CM | POA: Diagnosis not present

## 2020-04-18 DIAGNOSIS — M85852 Other specified disorders of bone density and structure, left thigh: Secondary | ICD-10-CM

## 2020-04-18 LAB — COMPREHENSIVE METABOLIC PANEL
ALT: 17 U/L (ref 0–35)
AST: 21 U/L (ref 0–37)
Albumin: 3.9 g/dL (ref 3.5–5.2)
Alkaline Phosphatase: 51 U/L (ref 39–117)
BUN: 22 mg/dL (ref 6–23)
CO2: 27 mEq/L (ref 19–32)
Calcium: 9.2 mg/dL (ref 8.4–10.5)
Chloride: 108 mEq/L (ref 96–112)
Creatinine, Ser: 1.2 mg/dL (ref 0.40–1.20)
GFR: 43.02 mL/min — ABNORMAL LOW (ref >60.00)
Glucose, Bld: 101 mg/dL — ABNORMAL HIGH (ref 70–99)
Potassium: 3.4 mEq/L — ABNORMAL LOW (ref 3.5–5.1)
Sodium: 144 mEq/L (ref 135–145)
Total Bilirubin: 0.5 mg/dL (ref 0.2–1.2)
Total Protein: 5.9 g/dL — ABNORMAL LOW (ref 6.0–8.3)

## 2020-04-18 LAB — CBC WITH DIFFERENTIAL/PLATELET
Basophils Absolute: 0 10*3/uL (ref 0.0–0.1)
Basophils Relative: 0.9 % (ref 0.0–3.0)
Eosinophils Absolute: 0.2 10*3/uL (ref 0.0–0.7)
Eosinophils Relative: 4.1 % (ref 0.0–5.0)
HCT: 35.6 % — ABNORMAL LOW (ref 36.0–46.0)
Hemoglobin: 11.7 g/dL — ABNORMAL LOW (ref 12.0–15.0)
Lymphocytes Relative: 32.5 % (ref 12.0–46.0)
Lymphs Abs: 1.7 10*3/uL (ref 0.7–4.0)
MCHC: 32.9 g/dL (ref 30.0–36.0)
MCV: 88.1 fl (ref 78.0–100.0)
Monocytes Absolute: 0.6 10*3/uL (ref 0.1–1.0)
Monocytes Relative: 11.1 % (ref 3.0–12.0)
Neutro Abs: 2.8 10*3/uL (ref 1.4–7.7)
Neutrophils Relative %: 51.4 % (ref 43.0–77.0)
Platelets: 209 10*3/uL (ref 150.0–400.0)
RBC: 4.04 Mil/uL (ref 3.87–5.11)
RDW: 15.1 % (ref 11.5–15.5)
WBC: 5.4 10*3/uL (ref 4.0–10.5)

## 2020-04-18 LAB — VITAMIN B12: Vitamin B-12: 560 pg/mL (ref 211–911)

## 2020-04-18 LAB — HEMOGLOBIN A1C: Hgb A1c MFr Bld: 5.9 % (ref 4.6–6.5)

## 2020-04-18 LAB — LIPID PANEL
Cholesterol: 153 mg/dL (ref 0–200)
HDL: 44.3 mg/dL (ref >39.00)
LDL Cholesterol: 91 mg/dL (ref 0–99)
NonHDL: 108.38
Total CHOL/HDL Ratio: 3
Triglycerides: 88 mg/dL (ref 0.0–149.0)
VLDL: 17.6 mg/dL (ref 0.0–40.0)

## 2020-04-18 LAB — VITAMIN D 25 HYDROXY (VIT D DEFICIENCY, FRACTURES): VITD: 52.32 ng/mL (ref 30.00–100.00)

## 2020-04-19 NOTE — Progress Notes (Signed)
Remote pacemaker transmission.   

## 2020-04-21 ENCOUNTER — Ambulatory Visit (INDEPENDENT_AMBULATORY_CARE_PROVIDER_SITE_OTHER)
Admission: RE | Admit: 2020-04-21 | Discharge: 2020-04-21 | Disposition: A | Discharge status: Home / Self Care | Payer: Medicare PPO | Source: Ambulatory Visit | Attending: Family Medicine | Admitting: Family Medicine

## 2020-04-21 ENCOUNTER — Other Ambulatory Visit: Payer: Self-pay

## 2020-04-21 DIAGNOSIS — M85852 Other specified disorders of bone density and structure, left thigh: Secondary | ICD-10-CM | POA: Diagnosis not present

## 2020-04-22 ENCOUNTER — Other Ambulatory Visit: Payer: Self-pay

## 2020-04-22 DIAGNOSIS — D649 Anemia, unspecified: Secondary | ICD-10-CM

## 2020-04-25 ENCOUNTER — Ambulatory Visit (INDEPENDENT_AMBULATORY_CARE_PROVIDER_SITE_OTHER): Payer: Medicare PPO | Admitting: Family Medicine

## 2020-04-25 ENCOUNTER — Other Ambulatory Visit: Payer: Self-pay

## 2020-04-25 ENCOUNTER — Encounter: Payer: Self-pay | Admitting: Family Medicine

## 2020-04-25 VITALS — BP 122/80 | HR 67 | Temp 98.2°F | Ht 60.0 in | Wt 167.0 lb

## 2020-04-25 DIAGNOSIS — M25552 Pain in left hip: Secondary | ICD-10-CM

## 2020-04-25 DIAGNOSIS — M810 Age-related osteoporosis without current pathological fracture: Secondary | ICD-10-CM | POA: Insufficient documentation

## 2020-04-25 DIAGNOSIS — M7062 Trochanteric bursitis, left hip: Secondary | ICD-10-CM | POA: Diagnosis not present

## 2020-04-25 DIAGNOSIS — M545 Low back pain: Secondary | ICD-10-CM

## 2020-04-25 DIAGNOSIS — G8929 Other chronic pain: Secondary | ICD-10-CM

## 2020-04-25 NOTE — Patient Instructions (Addendum)
Have X-ray of low back and hip done at [facility] ([place]) 8:30 to 5pm.  closed 12:30 to 1pm) this week.   Reclast Infusion will take between 1-2 hours  1)Must be well hydrated. Drink 1-2 full glasses of water before you go for procedure.   2)Patient must be taking at least 1200mg  po of Calcium.    3)Patient must be taking over 400 units of vitamin D daily.

## 2020-04-25 NOTE — Progress Notes (Signed)
Phone [phone] In person visit   Subjective:   Kylie Patrick is a 84 y.o. year old very pleasant female patient who presents for/with See problem oriented charting Chief Complaint  Patient presents with  . Follow-up  . Back Pain  . hip pain    This visit occurred during the SARS-CoV-2 public health emergency.  Safety protocols were in place, including screening questions prior to the visit, additional usage of staff PPE, and extensive cleaning of exam room while observing appropriate contact time as indicated for disinfecting solutions.   Past Medical History-  Patient Active Problem List   Diagnosis Date Noted  . Complete heart block ([place]) 04/05/2020    Priority: High  . Osteoporosis 04/25/2020    Priority: Medium  . Right carpal tunnel syndrome 05/02/2019    Priority: Medium  . Diastolic dysfunction 32/67/1245    Priority: Medium  . Hyperglycemia 10/09/2014    Priority: Medium  . GERD (gastroesophageal reflux disease) 10/22/2013    Priority: Medium  . CKD (chronic kidney disease) stage 3, GFR 30-59 ml/min (HCC) 05/05/2008    Priority: Medium  . Anxiety state 04/20/2008    Priority: Medium  . HYPERLIPIDEMIA 08/12/2007    Priority: Medium  . Essential hypertension 08/12/2007    Priority: Medium  . Rash 07/04/2017    Priority: Low  . Hiatal hernia 08/03/2013    Priority: Low  . Personal history of colonic polyps 01/09/2013    Priority: Low  . NIGHT SWEATS 02/19/2008    Priority: Low    Medications- reviewed and updated Current Outpatient Medications  Medication Sig Dispense Refill  . ALPRAZolam (XANAX) 0.25 MG tablet TAKE 1 TABLET BY MOUTH 2 TIMES DAILY AS NEEDED FOR ANXIETY. 30 tablet 2  . aspirin EC 81 MG tablet Take 81 mg by mouth daily.    . Calcium in Bone Mineral Cmplx 350 MG MISC Take 3 each by mouth at bedtime. Takes 1 in the morning and 2 at night    . carvedilol (COREG) 25 MG tablet Take 0.5 tablets (12.5 mg total) by mouth 2 (two) times daily.  Annual appt due in June must see provider for future refills 180 tablet 0  . Cholecalciferol (VITAMIN D3) 2000 UNITS TABS Take 1 tablet by mouth daily with lunch.     . Cyanocobalamin (VITAMIN B 12 PO) Take 1 tablet by mouth daily with lunch.    . esomeprazole (NEXIUM) 40 MG capsule TAKE 1 CAPSULE BY MOUTH TWICE A DAY BEFORE MEALS 180 capsule 0  . ezetimibe-simvastatin (VYTORIN) 10-20 MG tablet TAKE 1 TABLET BY MOUTH EVERYDAY AT BEDTIME 90 tablet 1  . folic acid (FOLVITE) 809 MCG tablet Take 400 mcg by mouth every morning.    . furosemide (LASIX) 40 MG tablet TAKE 1 TABLET BY MOUTH DAILY. ANNUAL APPT DUE IN JUNE MUST SEE PROVIDER FOR FUTURE REFILLS 90 tablet 0  . gabapentin (NEURONTIN) 100 MG capsule Take 1 capsule (100 mg total) by mouth every 8 (eight) hours as needed (pain). 30 capsule 2  . losartan (COZAAR) 100 MG tablet TAKE 1 TABLET BY MOUTH EVERY DAY 90 tablet 1  . Magnesium 400 MG CAPS Take 400 mg by mouth at bedtime.     . Omega-3 Fatty Acids (FISH OIL) 500 MG CAPS Take 500 mg by mouth daily with lunch.      No current facility-administered medications for this visit.     Objective:  BP 122/80   Pulse 67   Temp 98.2 F (36.8 C)  Ht 5' (1.524 m)   Wt 167 lb (75.8 kg)   SpO2 98%   BMI 32.61 kg/m  Gen: NAD, resting comfortably CV: RRR no murmurs rubs or gallops Lungs: CTAB no crackles, wheeze, rhonchi Abdomen: soft/nontender/nondistended/normal bowel sounds. No rebound or guarding.  Ext: 1+ edema, slightly worse in left foot than right but no calf pain or unilateral swelling at the calf Skin: warm, dry     Assessment and Plan  # Back Pain/left lateral Hip Pain S:left hip pain that feels like someone has a knife In it and has noticed swelling in left foot. Pain located laterally over her trochanteric bursa. Started yesterday morning.  Pain up to 10/10 yesterday- today 7/10. Worse if bumps into it or pushes it  Also having issues for about 2 years if active with her arms-  gets a lot of low back pain- as simple as cooking dinner/cleaning up can bother her. Does have to do a lot of reaching up in her kitchen.   Pain rating, quality, Location-above left hip Started, duration-Sunday Worse with-standing with arms up Relieved by-sitting down  Previous Treatment-tylenol . Avoids nsaids due to cardiology and [place] kidney recs.  Previous imaging-no  ROS-No saddle anesthesia, bladder incontinence, fecal incontinence, weakness in extremity. History negative for trauma, history of cancer, fever, chills, unintentional weight loss, recent bacterial infection, recent IV drug use, HIV,Rare leftnumbness or tingling in extremity.pain worse at night or while supine.  A/P: 84 year old female with several months of low back pain and now with severe left hip pain.  Left hip pain appears to be greater trochanteric bursitis to me.  I would like to treat with a course of prednisone but with severity of pain as well as back pain and osteoporosis history would like to get x-rays first to make sure this is more safe.  I think a short course of low-dose prednisone would be a good choice with benefits outweighing risks (osteoporosis and hyperglycemia) -Considered NSAIDs but she has been advised against these based on CKD stage III and per cardiology.  #Osteoporosis S: On April 21, 2020 patient had a bone density in the left femoral neck of -2.4 T score.  Her 10-year hip fracture risk is 6% which places her in osteoporosis range. -She agrees to take calcium 20 mg a day and vitamin D 800 units/day -History of severe reflux requiring high-dose PPI twice a day -For this reason we have opted to pursue Reclast instead of Fosamax with team looking into this 04/25/20 - no planned major dental work A/P: Osteoporosis with elevated hip fracture risk-we will plan to start Reclast once we can get this approved  Recommended follow up: November visit or sooner if fails to improve with above  workup/plan Future Appointments  Date Time Provider [place]  06/02/2020 10:10 AM Gatha Mayer, MD [facility]  07/14/2020  8:40 AM [facility] DEVICE REMOTES [facility]  10/10/2020  1:20 PM Marin Olp, MD [facility]  10/13/2020  8:40 AM [facility] DEVICE [facility]OTES [facility]    Lab/Order associations:   ICD-10-CM   1. Age-related osteoporosis without current pathological fracture  M81.0   2. Chronic bilateral low back pain without sciatica  M54.5 DG Lumbar Spine Complete   G89.29   3. Left hip pain  M25.552 DG HIP UNILAT W OR W/O PELVIS 2-3 VIEWS LEFT  4. Trochanteric bursitis of left hip  M70.62    Time Spent: 37 minutes of total time (3:22 PM- 3:59 PM) was spent on  the date of the encounter performing the following actions: chart review prior to seeing the patient, obtaining history, performing a medically necessary exam, counseling on the treatment plan, placing orders, and documenting in our EHR.   Return precautions advised.  Garret Reddish, MD

## 2020-04-26 ENCOUNTER — Ambulatory Visit (INDEPENDENT_AMBULATORY_CARE_PROVIDER_SITE_OTHER)
Admission: RE | Admit: 2020-04-26 | Discharge: 2020-04-26 | Disposition: A | Discharge status: Home / Self Care | Payer: Medicare PPO | Source: Ambulatory Visit | Attending: Family Medicine | Admitting: Family Medicine

## 2020-04-26 ENCOUNTER — Encounter: Payer: Self-pay | Admitting: Family Medicine

## 2020-04-26 DIAGNOSIS — M25552 Pain in left hip: Secondary | ICD-10-CM

## 2020-04-26 DIAGNOSIS — G8929 Other chronic pain: Secondary | ICD-10-CM

## 2020-04-26 DIAGNOSIS — M545 Low back pain, unspecified: Secondary | ICD-10-CM

## 2020-04-26 DIAGNOSIS — I700 Atherosclerosis of aorta: Secondary | ICD-10-CM | POA: Insufficient documentation

## 2020-04-27 ENCOUNTER — Telehealth: Payer: Self-pay

## 2020-04-27 NOTE — Telephone Encounter (Signed)
Patient calling in stating that Prednisone prescription was not sent in from her visit on 04/25/20. Please advise

## 2020-04-28 ENCOUNTER — Other Ambulatory Visit (INDEPENDENT_AMBULATORY_CARE_PROVIDER_SITE_OTHER): Payer: Medicare PPO

## 2020-04-28 DIAGNOSIS — D649 Anemia, unspecified: Secondary | ICD-10-CM

## 2020-04-28 LAB — FECAL OCCULT BLOOD, IMMUNOCHEMICAL: Fecal Occult Bld: NEGATIVE

## 2020-04-28 MED ORDER — PREDNISONE 20 MG PO TABS
ORAL_TABLET | ORAL | 0 refills | Status: DC
Start: 2020-04-28 — End: 2020-08-12

## 2020-04-28 NOTE — Telephone Encounter (Signed)
Left message on voicemail Rx for Prednisone was sent to pharmacy per Dr. Yong Channel.

## 2020-04-28 NOTE — Telephone Encounter (Signed)
Please tell her sorry- I just sent this in

## 2020-04-28 NOTE — Addendum Note (Signed)
Addended by: Marin Olp on: 04/28/2020 07:43 AM   Modules accepted: Orders

## 2020-04-29 ENCOUNTER — Other Ambulatory Visit: Payer: Medicare Other

## 2020-05-03 ENCOUNTER — Ambulatory Visit: Payer: Medicare Other | Admitting: Internal Medicine

## 2020-05-18 ENCOUNTER — Telehealth: Payer: Self-pay | Admitting: Internal Medicine

## 2020-05-18 MED ORDER — ESOMEPRAZOLE MAGNESIUM 40 MG PO CPDR: 40.0000 mg | DELAYED_RELEASE_CAPSULE | Freq: Two times a day (BID) | ORAL | 0 refills | Status: DC

## 2020-05-18 NOTE — Telephone Encounter (Signed)
Esomeprazole refilled as office appointment has been made.

## 2020-06-02 ENCOUNTER — Ambulatory Visit: Payer: Medicare PPO | Admitting: Internal Medicine

## 2020-06-02 ENCOUNTER — Encounter: Payer: Self-pay | Admitting: Internal Medicine

## 2020-06-02 VITALS — BP 134/78 | HR 78 | Ht 59.5 in | Wt 170.4 lb

## 2020-06-02 DIAGNOSIS — R159 Full incontinence of feces: Secondary | ICD-10-CM

## 2020-06-02 DIAGNOSIS — K6289 Other specified diseases of anus and rectum: Secondary | ICD-10-CM | POA: Diagnosis not present

## 2020-06-02 DIAGNOSIS — K2100 Gastro-esophageal reflux disease with esophagitis, without bleeding: Secondary | ICD-10-CM

## 2020-06-02 MED ORDER — ESOMEPRAZOLE MAGNESIUM 40 MG PO CPDR: 40.0000 mg | DELAYED_RELEASE_CAPSULE | Freq: Two times a day (BID) | ORAL | 3 refills | Status: DC

## 2020-06-02 MED ORDER — FAMOTIDINE 40 MG PO TABS
40.0000 mg | ORAL_TABLET | Freq: Every day | ORAL | 3 refills | Status: DC
Start: 2020-06-02 — End: 2021-05-29

## 2020-06-02 NOTE — Patient Instructions (Signed)
We have sent the following medications to your pharmacy for you to pick up at your convenience: Nexium, generic Pepcid   Buy a wedge pillow for your bed.   If the fecal incontinence acts up again call us and we can refer you for physical therapy.   I appreciate the opportunity to care for you. Silvano Rusk, MD, [facility]

## 2020-06-02 NOTE — Progress Notes (Signed)
Kylie Patrick 84 y.o. 11-Jul-1936 [id number]  Assessment & Plan:   Encounter Diagnoses  Name Primary?  . Gastroesophageal reflux disease with esophagitis without hemorrhage Yes  . Full incontinence of feces   . Decreased anal sphincter tone     We will add famotidine 40 mg nightly Have advised to purchase a medical wedge for better sleeping position  Discussed the possibility of attempting physical therapy since she is asymptomatic with respect to the fecal incontinence at this time will hold off on that and she will call me if she feels like that is necessary.  Return in 1 year sooner if needed  CC: Kylie Patrick   Subjective:   Chief Complaint: Reflux and fecal incontinence  HPI Kylie Patrick is here with a couple of problems.  She has a history of slipped Nissen fundoplication and reflux and has been on twice daily Nexium 40 mg.  However she is having symptoms at night several nights a week.  She does not eat her last meal for hours before bedtime, she goes to bed late and eats by 6 PM.  However but she is waking up at night with some reflux symptoms.  She tried eating some crackers at bedtime to see if that would help.  Head of bed is not elevated.   Also in May she had several episodes of urgency and fecal incontinence.  Sometimes there was no urge prior.  This resolved on its own.  She was not constipated before no history of impaction etc. or anything like that.  She has never had problems like that before.  She does not seem to have urinary incontinence issues. Allergies  Allergen Reactions  . Chlorthalidone     01/09/13 creatinine 2.2  . Hydrochlorothiazide     Hypokalemia  . Losartan Potassium-Hctz     Hypokalemia  . Norvasc [Amlodipine Besylate]     Edema  . Raloxifene     HTN  . Rofecoxib     HTN   Current Meds  Medication Sig  . ALPRAZolam (XANAX) 0.25 MG tablet TAKE 1 TABLET BY MOUTH 2 TIMES DAILY AS NEEDED FOR ANXIETY.  Marland Kitchen aspirin EC 81 MG  tablet Take 81 mg by mouth daily.  . Calcium in Bone Mineral Cmplx 350 MG MISC Take 3 each by mouth at bedtime. Takes 1 in the morning and 2 at night  . carvedilol (COREG) 25 MG tablet Take 0.5 tablets (12.5 mg total) by mouth 2 (two) times daily. Annual appt due in June must see provider for future refills  . Cholecalciferol (VITAMIN D3) 2000 UNITS TABS Take 1 tablet by mouth daily with lunch.   . Cyanocobalamin (VITAMIN B 12 PO) Take 1 tablet by mouth daily with lunch.  . esomeprazole (NEXIUM) 40 MG capsule Take 1 capsule (40 mg total) by mouth 2 (two) times daily before a meal. See you at your July 22 appointment  . ezetimibe-simvastatin (VYTORIN) 10-20 MG tablet TAKE 1 TABLET BY MOUTH EVERYDAY AT BEDTIME  . folic acid (FOLVITE) 694 MCG tablet Take 400 mcg by mouth every morning.  . furosemide (LASIX) 40 MG tablet TAKE 1 TABLET BY MOUTH DAILY. ANNUAL APPT DUE IN JUNE MUST SEE PROVIDER FOR FUTURE REFILLS  . gabapentin (NEURONTIN) 100 MG capsule Take 1 capsule (100 mg total) by mouth every 8 (eight) hours as needed (pain).  Marland Kitchen losartan (COZAAR) 100 MG tablet TAKE 1 TABLET BY MOUTH EVERY DAY  . Magnesium 400 MG CAPS Take 400 mg  by mouth at bedtime.   . Omega-3 Fatty Acids (FISH OIL) 500 MG CAPS Take 500 mg by mouth daily with lunch.   . predniSONE (DELTASONE) 20 MG tablet Take 1 tablet by mouth daily for 5 days, then 1/2 tablet daily for 2 days   Past Medical History:  Diagnosis Date  . Anxiety   . Colon polyp 02-03-02  . Complication of anesthesia    slow to wake up one time  . Gastroenteritis    w/ renal insufficiency in context of protracted nausea & vomitting Feb 03, 2005  . GERD (gastroesophageal reflux disease)   . H/O hiatal hernia   . Headache(784.0)    occasional lifelong- gabapentin helps a  . Hiatal hernia   . Hyperlipidemia   . Hypertension    w/ LVH on ECHO  . Pacemaker 07/2016  . Tonsillitis 02/03/2005   Past Surgical History:  Procedure Laterality Date  . ABDOMINAL HYSTERECTOMY  1976     BSO for endometriosis and bengin tumor  . APPENDECTOMY  1970  . BREAST LUMPECTOMY Left 70's   X 2  . BUNIONECTOMY Bilateral   . CATARACT EXTRACTION W/PHACO Left 05/14/2013   Procedure: CATARACT EXTRACTION PHACO AND INTRAOCULAR LENS PLACEMENT (IOC);  Surgeon: Tonny Branch, Patrick;  Location: [facility];  Service: Ophthalmology;  Laterality: Left;  CDE:9.76  . CATARACT EXTRACTION W/PHACO Right 06/08/2013   Procedure: CATARACT EXTRACTION PHACO AND INTRAOCULAR LENS PLACEMENT (IOC);  Surgeon: Tonny Branch, Patrick;  Location: [facility];  Service: Ophthalmology;  Laterality: Right;  CDE 13.65  . CHOLECYSTECTOMY  1970  . COLONOSCOPY W/ POLYPECTOMY  Feb 03, 2002   Dr [name]  . EP IMPLANTABLE DEVICE N/A 07/18/2016   Procedure: Pacemaker Implant;  Surgeon: Deboraha Sprang, Patrick;  Location: [place] [facility];  Service: Cardiovascular;  Laterality: N/A;  . ESOPHAGEAL MANOMETRY N/A 10/05/2013   Procedure: ESOPHAGEAL MANOMETRY (EM);  Surgeon: Gatha Mayer, Patrick;  Location: [facility];  Service: Endoscopy;  Laterality: N/A;  . ESOPHAGOGASTRODUODENOSCOPY  02/05/13   Large Hiatal Hernia  . HERNIA REPAIR    . LAPAROSCOPIC NISSEN FUNDOPLICATION Bilateral 32/95/1884   Procedure: LAPAROSCOPIC NISSEN FUNDOPLICATION with hiatal hernia repair ;  Surgeon: Edward Jolly, Patrick;  Location: [facility];  Service: General;  Laterality: Bilateral;   Social History   Social History Narrative   Married 19 years in 2020-02-04. Married to 1st husband 21 years but died of massive MI. In 04-Feb-2020- Derrick at 43 and lost Louie Casa at age 32 to cancer- lung start.    Husband Jimmy patient of Dr. Yong Channel.       Worked for [employer] for 24 years.       Hobbies: has a friend she spends time with 1 day a week and goes out- shopping, eating, or even gambling   family history includes Aneurysm in her paternal grandmother; Breast cancer in her maternal aunt; Coronary artery disease in her maternal grandfather and mother; Diabetes in her maternal aunt and  mother; Heart attack (age of onset: 71) in her father; Heart disease in her sister; Stomach cancer in her maternal aunt; Stroke in her maternal grandmother and paternal grandfather.   Review of Systems As per HPI  Objective:   Physical Exam BP (!) 134/78   Pulse 78   Ht 4' 11.5" (1.511 m)   Wt 170 lb 6 oz (77.3 kg)   BMI 33.84 kg/m  Elderly ww NAD Some kyphosis  Robin present  Small tags/external Open and decreased anal sphincter tone Small rectocele  No mass  Increased descent with appropriate abdominal contraction on simulated defecation maneuver

## 2020-06-07 ENCOUNTER — Telehealth: Payer: Self-pay | Admitting: Family Medicine

## 2020-06-07 NOTE — Telephone Encounter (Signed)
Patient calling about Reclast Infusion, she has not received a call about getting it done. Please advise.

## 2020-07-07 NOTE — Telephone Encounter (Signed)
Pt is calling to follow up on this. She states she has been waiting 2 months from the initial appointment.

## 2020-07-08 NOTE — Telephone Encounter (Signed)
Team-can you check on status of request infusion  This was placed on her last AVS so I believe it was being set up but I am not sure what happened beyond that "Reclast Infusion will take between 1-2 hours   1)Must be well hydrated. Drink 1-2 full glasses of water before you go for procedure.    2)Patient must be taking at least 1200mg  po of Calcium.     3)Patient must be taking over 400 units of vitamin D daily. "

## 2020-07-08 NOTE — Telephone Encounter (Signed)
Please advise 

## 2020-07-12 NOTE — Telephone Encounter (Signed)
Have called found out where to send order now we need to have a cmp within 30 days. Ok to have patient in for lab only to get this done?

## 2020-07-12 NOTE — Telephone Encounter (Signed)
Yes thanks- also can you teach Bevelyn Ngo how to do the reclast infusion set up please?

## 2020-07-13 ENCOUNTER — Other Ambulatory Visit: Payer: Self-pay

## 2020-07-13 ENCOUNTER — Other Ambulatory Visit: Payer: Medicare PPO

## 2020-07-13 DIAGNOSIS — I10 Essential (primary) hypertension: Secondary | ICD-10-CM

## 2020-07-13 NOTE — Telephone Encounter (Signed)
Patient came in today and got blood work done.

## 2020-07-13 NOTE — Telephone Encounter (Signed)
Hold message check for results to fax with order

## 2020-07-13 NOTE — Telephone Encounter (Signed)
Can you call and let her know she needs to have labs done before we can set up for this? If you will make lab only appointment for her and let her know we will call with app after. This is non fasting lab.   Let me know when you make app so I can get everything in motion for her

## 2020-07-14 ENCOUNTER — Ambulatory Visit (INDEPENDENT_AMBULATORY_CARE_PROVIDER_SITE_OTHER): Payer: Medicare PPO | Admitting: *Deleted

## 2020-07-14 DIAGNOSIS — I442 Atrioventricular block, complete: Secondary | ICD-10-CM

## 2020-07-14 LAB — COMPLETE METABOLIC PANEL WITH GFR
AG Ratio: 1.7 (calc) (ref 1.0–2.5)
ALT: 13 U/L (ref 6–29)
AST: 21 U/L (ref 10–35)
Albumin: 3.9 g/dL (ref 3.6–5.1)
Alkaline phosphatase (APISO): 51 U/L (ref 37–153)
BUN/Creatinine Ratio: 13 (calc) (ref 6–22)
BUN: 26 mg/dL — ABNORMAL HIGH (ref 7–25)
CO2: 30 mmol/L (ref 20–32)
Calcium: 9.5 mg/dL (ref 8.6–10.4)
Chloride: 104 mmol/L (ref 98–110)
Creat: 1.93 mg/dL — ABNORMAL HIGH (ref 0.60–0.88)
GFR, Est African American: 27 mL/min/{1.73_m2} — ABNORMAL LOW (ref >=60)
GFR, Est Non African American: 24 mL/min/{1.73_m2} — ABNORMAL LOW (ref >=60)
Globulin: 2.3 g/dL (calc) (ref 1.9–3.7)
Glucose, Bld: 96 mg/dL (ref 65–99)
Potassium: 3.7 mmol/L (ref 3.5–5.3)
Sodium: 144 mmol/L (ref 135–146)
Total Bilirubin: 0.6 mg/dL (ref 0.2–1.2)
Total Protein: 6.2 g/dL (ref 6.1–8.1)

## 2020-07-15 NOTE — Telephone Encounter (Signed)
Labs are in but I do not see where you have reviewed. Let me know once ok to send for infusion.

## 2020-07-15 NOTE — Telephone Encounter (Signed)
Oddly patient has had significant worsening in her kidney function over the last 2 months-I will respond on the result note about this.  She cannot have the Reclast infusion at present

## 2020-07-16 LAB — CUP PACEART REMOTE DEVICE CHECK
Battery Remaining Longevity: 43 mo
Battery Voltage: 2.98 V
Brady Statistic AP VP Percent: 19.97 %
Brady Statistic AP VS Percent: 0 %
Brady Statistic AS VP Percent: 80.03 %
Brady Statistic AS VS Percent: 0 %
Brady Statistic RA Percent Paced: 19.95 %
Brady Statistic RV Percent Paced: 99.99 %
Date Time Interrogation Session: 20210903112016
Implantable Lead Implant Date: 20170906
Implantable Lead Implant Date: 20170906
Implantable Lead Location: 753859
Implantable Lead Location: 753860
Implantable Lead Model: 3830
Implantable Lead Model: 5076
Implantable Pulse Generator Implant Date: 20170906
Lead Channel Impedance Value: 361 Ohm
Lead Channel Impedance Value: 418 Ohm
Lead Channel Impedance Value: 437 Ohm
Lead Channel Impedance Value: 513 Ohm
Lead Channel Pacing Threshold Amplitude: 0.625 V
Lead Channel Pacing Threshold Amplitude: 1.875 V
Lead Channel Pacing Threshold Pulse Width: 0.4 ms
Lead Channel Pacing Threshold Pulse Width: 0.4 ms
Lead Channel Sensing Intrinsic Amplitude: 2.25 mV
Lead Channel Sensing Intrinsic Amplitude: 2.25 mV
Lead Channel Sensing Intrinsic Amplitude: 5.25 mV
Lead Channel Sensing Intrinsic Amplitude: 5.25 mV
Lead Channel Setting Pacing Amplitude: 2 V
Lead Channel Setting Pacing Amplitude: 2.5 V
Lead Channel Setting Pacing Pulse Width: 1 ms
Lead Channel Setting Sensing Sensitivity: 0.9 mV

## 2020-07-19 NOTE — Telephone Encounter (Signed)
See lab notes for information  °

## 2020-07-19 NOTE — Progress Notes (Signed)
Remote pacemaker transmission.   

## 2020-07-20 ENCOUNTER — Other Ambulatory Visit: Payer: Medicare PPO

## 2020-08-01 DIAGNOSIS — I129 Hypertensive chronic kidney disease with stage 1 through stage 4 chronic kidney disease, or unspecified chronic kidney disease: Secondary | ICD-10-CM | POA: Diagnosis not present

## 2020-08-01 DIAGNOSIS — N189 Chronic kidney disease, unspecified: Secondary | ICD-10-CM | POA: Diagnosis not present

## 2020-08-01 DIAGNOSIS — I10 Essential (primary) hypertension: Secondary | ICD-10-CM | POA: Diagnosis not present

## 2020-08-01 DIAGNOSIS — N183 Chronic kidney disease, stage 3 unspecified: Secondary | ICD-10-CM | POA: Diagnosis not present

## 2020-08-11 DIAGNOSIS — Z950 Presence of cardiac pacemaker: Secondary | ICD-10-CM | POA: Insufficient documentation

## 2020-08-11 NOTE — Progress Notes (Signed)
Patient Care Team: Marin Olp, MD as PCP - General (Family Medicine)   HPI  Kylie Patrick is a 84 y.o. female Seen in followup for His Bundle pacemaker implanted 9/17 for symptomatic 2:1 block now with CHB She had had syncope and then weakness prior to pacing  12/16 Echo  EF 55-60%   ;    Date Cr K Hgb  6/20 1.29 3.5  12.8  9/21  1.93 3.7 11.7   The patient denies chest pain, nocturnal dyspnea, orthopnea or peripheral edema.  There have been no palpitations, lightheadedness or syncope  Some SOB w exertion .    Past Medical History:  Diagnosis Date  . Anxiety   . Colon polyp 2003  . Complication of anesthesia    slow to wake up one time  . Gastroenteritis    w/ renal insufficiency in context of protracted nausea & vomitting 2006  . GERD (gastroesophageal reflux disease)   . H/O hiatal hernia   . Headache(784.0)    occasional lifelong- gabapentin helps a  . Hiatal hernia   . Hyperlipidemia   . Hypertension    w/ LVH on ECHO  . Pacemaker 07/2016  . Tonsillitis 2006    Past Surgical History:  Procedure Laterality Date  . ABDOMINAL HYSTERECTOMY  1976   BSO for endometriosis and bengin tumor  . APPENDECTOMY  1970  . BREAST LUMPECTOMY Left 70's   X 2  . BUNIONECTOMY Bilateral   . CATARACT EXTRACTION W/PHACO Left 05/14/2013   Procedure: CATARACT EXTRACTION PHACO AND INTRAOCULAR LENS PLACEMENT (IOC);  Surgeon: Tonny Branch, MD;  Location: [facility];  Service: Ophthalmology;  Laterality: Left;  CDE:9.76  . CATARACT EXTRACTION W/PHACO Right 06/08/2013   Procedure: CATARACT EXTRACTION PHACO AND INTRAOCULAR LENS PLACEMENT (IOC);  Surgeon: Tonny Branch, MD;  Location: [facility];  Service: Ophthalmology;  Laterality: Right;  CDE 13.65  . CHOLECYSTECTOMY  1970  . COLONOSCOPY W/ POLYPECTOMY  2003   Dr [name]  . EP IMPLANTABLE DEVICE N/A 07/18/2016   Procedure: Pacemaker Implant;  Surgeon: Deboraha Sprang, MD;  Location: [place] [facility];  Service: Cardiovascular;   Laterality: N/A;  . ESOPHAGEAL MANOMETRY N/A 10/05/2013   Procedure: ESOPHAGEAL MANOMETRY (EM);  Surgeon: Gatha Mayer, MD;  [facility];  Service: Endoscopy;  Laterality: N/A;  . ESOPHAGOGASTRODUODENOSCOPY  02/05/13   Large Hiatal Hernia  . HERNIA REPAIR    . LAPAROSCOPIC NISSEN FUNDOPLICATION Bilateral 44/01/4741   Procedure: LAPAROSCOPIC NISSEN FUNDOPLICATION with hiatal hernia repair ;  Surgeon: Edward Jolly, MD;  Location: [facility];  Service: General;  Laterality: Bilateral;    Current Outpatient Medications  Medication Sig Dispense Refill  . ALPRAZolam (XANAX) 0.25 MG tablet TAKE 1 TABLET BY MOUTH 2 TIMES DAILY AS NEEDED FOR ANXIETY. 30 tablet 2  . aspirin EC 81 MG tablet Take 81 mg by mouth daily.    . Calcium in Bone Mineral Cmplx 350 MG MISC Take 3 each by mouth at bedtime. Takes 1 in the morning and 2 at night    . carvedilol (COREG) 25 MG tablet Take 0.5 tablets (12.5 mg total) by mouth 2 (two) times daily. Annual appt due in June must see provider for future refills 180 tablet 0  . Cholecalciferol (VITAMIN D3) 2000 UNITS TABS Take 1 tablet by mouth daily with lunch.     . Cyanocobalamin (VITAMIN B 12 PO) Take 1 tablet by mouth daily with lunch.    . esomeprazole ([place])  40 MG capsule Take 1 capsule (40 mg total) by mouth 2 (two) times daily before a meal. See you at your July 22 appointment 180 capsule 3  . ezetimibe-simvastatin (VYTORIN) 10-20 MG tablet TAKE 1 TABLET BY MOUTH EVERYDAY AT BEDTIME 90 tablet 1  . famotidine (PEPCID) 40 MG tablet Take 1 tablet (40 mg total) by mouth at bedtime. 90 tablet 3  . folic acid (FOLVITE) 098 MCG tablet Take 400 mcg by mouth every morning.    . furosemide (LASIX) 40 MG tablet TAKE 1 TABLET BY MOUTH DAILY. ANNUAL APPT DUE IN JUNE MUST SEE PROVIDER FOR FUTURE REFILLS 90 tablet 0  . gabapentin (NEURONTIN) 100 MG capsule Take 1 capsule (100 mg total) by mouth every 8 (eight) hours as needed (pain). 30 capsule 2  . losartan  (COZAAR) 100 MG tablet TAKE 1 TABLET BY MOUTH EVERY DAY 90 tablet 1  . Magnesium 400 MG CAPS Take 400 mg by mouth at bedtime.     . Omega-3 Fatty Acids (FISH OIL) 500 MG CAPS Take 500 mg by mouth daily with lunch.      No current facility-administered medications for this visit.    Allergies  Allergen Reactions  . Chlorthalidone     01/09/13 creatinine 2.2  . Hydrochlorothiazide     Hypokalemia  . Losartan Potassium-Hctz     Hypokalemia  . Norvasc [Amlodipine Besylate]     Edema  . Raloxifene     HTN  . Rofecoxib     HTN      Review of Systems negative except from HPI and PMH  Physical Exam BP (!) 112/98   Pulse 74   Ht 4' 11.5" (1.511 m)   Wt 169 lb 12.8 oz (77 kg)   SpO2 96%   BMI 33.72 kg/m  Well developed and .obese  in no acute distress HENT normal Neck supple with JVP-flat Clear Device pocket well healed; without hematoma or erythema.  There is no tethering  Regular rate and rhythm, no  gallop No  murmur Abd-soft with active BS No Clubbing cyanosis  edema Skin-warm and dry A & Oriented  Grossly normal sensory and motor function  ECG P-synchronous/ AV  pacing   Assessment and  Plan  2:1 AVB>> Complete heart block   Pacer- His Medtronic   Syncope    Exercise intolerance  SCAF  Exercising more and feeling better -- encouraged to do more  SCAF--will follow  No syncope    Current medicines are reviewed at length with the patient today .  The patient does not  have concerns regarding medicines.

## 2020-08-12 ENCOUNTER — Ambulatory Visit (INDEPENDENT_AMBULATORY_CARE_PROVIDER_SITE_OTHER): Payer: Medicare PPO | Admitting: Internal Medicine

## 2020-08-12 ENCOUNTER — Encounter: Payer: Self-pay | Admitting: Internal Medicine

## 2020-08-12 ENCOUNTER — Other Ambulatory Visit: Payer: Self-pay

## 2020-08-12 VITALS — BP 112/98 | HR 74 | Ht 59.5 in | Wt 169.8 lb

## 2020-08-12 DIAGNOSIS — I442 Atrioventricular block, complete: Secondary | ICD-10-CM

## 2020-08-12 DIAGNOSIS — Z950 Presence of cardiac pacemaker: Secondary | ICD-10-CM | POA: Diagnosis not present

## 2020-08-12 NOTE — Patient Instructions (Signed)
Medication Instructions:  Your physician recommends that you continue on your current medications as directed. Please refer to the Current Medication list given to you today.  *If you need a refill on your cardiac medications before your next appointment, please call your pharmacy*   Lab Work: None ordered.  If you have labs (blood work) drawn today and your tests are completely normal, you will receive your results only by: Marland Kitchen MyChart Message (if you have MyChart) OR . A paper copy in the mail If you have any lab test that is abnormal or we need to change your treatment, we will call you to review the results.   Testing/Procedures: None ordered.    Follow-Up: At [facility], you and your health needs are our priority.  As part of our continuing mission to provide you with exceptional heart care, we have created designated Provider Care Teams.  These Care Teams include your primary Cardiologist (physician) and Advanced Practice Providers (APPs -  Physician Assistants and Nurse Practitioners) who all work together to provide you with the care you need, when you need it.  We recommend signing up for the patient portal called "MyChart".  Sign up information is provided on this After Visit Summary.  MyChart is used to connect with patients for Virtual Visits (Telemedicine).  Patients are able to view lab/test results, encounter notes, upcoming appointments, etc.  Non-urgent messages can be sent to your provider as well.   To learn more about what you can do with MyChart, go to [email or web address].    Your next appointment:   12 month(s)  The format for your next appointment:   In Person  Provider:   You may see Dr Caryl Comes or one of the following Advanced Practice Providers on your designated Care Team:    Chanetta Marshall, NP  Tommye Standard, PA-C  Legrand Como "[place]" [place], [place]

## 2020-08-18 ENCOUNTER — Ambulatory Visit: Payer: Medicare PPO | Attending: Internal Medicine

## 2020-08-18 DIAGNOSIS — Z23 Encounter for immunization: Secondary | ICD-10-CM

## 2020-08-18 NOTE — Progress Notes (Signed)
   Covid-19 Vaccination Clinic  Name:  Kylie Patrick    MRN: [id number] DOB: Oct 11, 1936  08/18/2020  [name] was observed post Covid-19 immunization for 15 minutes without incident. She was provided with Vaccine Information Sheet and instruction to access the V-Safe system.   [name] was instructed to call 911 with any severe reactions post vaccine: Marland Kitchen Difficulty breathing  . Swelling of face and throat  . A fast heartbeat  . A bad rash all over body  . Dizziness and weakness

## 2020-08-30 ENCOUNTER — Ambulatory Visit (INDEPENDENT_AMBULATORY_CARE_PROVIDER_SITE_OTHER): Payer: Medicare PPO

## 2020-08-30 ENCOUNTER — Encounter: Payer: Self-pay | Admitting: Family Medicine

## 2020-08-30 DIAGNOSIS — Z23 Encounter for immunization: Secondary | ICD-10-CM | POA: Diagnosis not present

## 2020-08-30 NOTE — Progress Notes (Signed)
Patient came into the office today to receive her flu shot. She tolerated the injection well. No other questions or concerns at this time.

## 2020-08-30 NOTE — Progress Notes (Signed)
I have reviewed and agree with note, evaluation, plan.   Jaylynne Birkhead, MD  

## 2020-08-31 ENCOUNTER — Other Ambulatory Visit: Payer: Self-pay | Admitting: Internal Medicine

## 2020-08-31 NOTE — Telephone Encounter (Signed)
Please refill as per office routine med refill policy (all routine meds refilled for 3 mo or monthly per pt preference up to one year from last visit, then month to month grace period for 3 mo, then further med refills will have to be denied)  

## 2020-09-02 ENCOUNTER — Other Ambulatory Visit: Payer: Self-pay | Admitting: Family Medicine

## 2020-10-10 ENCOUNTER — Other Ambulatory Visit: Payer: Self-pay

## 2020-10-10 ENCOUNTER — Encounter: Payer: Self-pay | Admitting: Family Medicine

## 2020-10-10 ENCOUNTER — Ambulatory Visit (INDEPENDENT_AMBULATORY_CARE_PROVIDER_SITE_OTHER): Payer: Medicare PPO | Admitting: Family Medicine

## 2020-10-10 VITALS — BP 118/78 | HR 90 | Temp 98.1°F | Ht 60.0 in | Wt 166.4 lb

## 2020-10-10 DIAGNOSIS — Z0000 Encounter for general adult medical examination without abnormal findings: Secondary | ICD-10-CM

## 2020-10-10 DIAGNOSIS — R739 Hyperglycemia, unspecified: Secondary | ICD-10-CM

## 2020-10-10 DIAGNOSIS — I10 Essential (primary) hypertension: Secondary | ICD-10-CM

## 2020-10-10 DIAGNOSIS — N183 Chronic kidney disease, stage 3 unspecified: Secondary | ICD-10-CM

## 2020-10-10 DIAGNOSIS — I700 Atherosclerosis of aorta: Secondary | ICD-10-CM | POA: Diagnosis not present

## 2020-10-10 MED ORDER — GABAPENTIN 100 MG PO CAPS: 100.0000 mg | ORAL_CAPSULE | Freq: Three times a day (TID) | ORAL | 2 refills | Status: DC | PRN

## 2020-10-10 NOTE — Patient Instructions (Addendum)
#  Right fourth finger trigger finger-showed her how to do a double Band-Aid splint and I would like for her to do this regularly for the next 1 to 2 weeks.  If not improving I would like to send her to sports medicine where they can consider injection  Recommended follow up: Return in about 6 months (around 04/09/2021) for follow up- or sooner if needed.

## 2020-10-10 NOTE — Progress Notes (Signed)
Phone [phone]   Subjective:  Patient presents today for their annual physical. Chief complaint-noted.   See problem oriented charting- ROS- full  review of systems was completed and negative except for: drooling at night, back pain, numbness- very mild amount of numness on outer left foot for a few months about the same since it started- could be coming from back with severe DDD, right hand carpal tunnel- bought a new brace but ended up causing hand to be sore- if avoids laying on that side seems to be ok. Also trigger finger on right arm.   The following were reviewed and entered/updated in epic: Past Medical History:  Diagnosis Date  . Anxiety   . Colon polyp 2003  . Complication of anesthesia    slow to wake up one time  . Gastroenteritis    w/ renal insufficiency in context of protracted nausea & vomitting 2006  . GERD (gastroesophageal reflux disease)   . H/O hiatal hernia   . Headache(784.0)    occasional lifelong- gabapentin helps a  . Hiatal hernia   . Hyperlipidemia   . Hypertension    w/ LVH on ECHO  . Pacemaker 07/2016  . Tonsillitis 2006   Patient Active Problem List   Diagnosis Date Noted  . Complete heart block ([place]) 04/05/2020    Priority: High  . Osteoporosis 04/25/2020    Priority: Medium  . Right carpal tunnel syndrome 05/02/2019    Priority: Medium  . Diastolic dysfunction 43/32/9518    Priority: Medium  . Hyperglycemia 10/09/2014    Priority: Medium  . GERD (gastroesophageal reflux disease) 10/22/2013    Priority: Medium  . CKD (chronic kidney disease) stage 3, GFR 30-59 ml/min (HCC) 05/05/2008    Priority: Medium  . Anxiety state 04/20/2008    Priority: Medium  . HYPERLIPIDEMIA 08/12/2007    Priority: Medium  . Essential hypertension 08/12/2007    Priority: Medium  . Rash 07/04/2017    Priority: Low  . Hiatal hernia 08/03/2013    Priority: Low  . Personal history of colonic polyps 01/09/2013    Priority: Low  . NIGHT SWEATS  02/19/2008    Priority: Low  . Pacemaker - MDT 08/11/2020  . Aortic atherosclerosis ([place]) 04/26/2020   Past Surgical History:  Procedure Laterality Date  . ABDOMINAL HYSTERECTOMY  1976   BSO for endometriosis and bengin tumor  . APPENDECTOMY  1970  . BREAST LUMPECTOMY Left 70's   X 2  . BUNIONECTOMY Bilateral   . CATARACT EXTRACTION W/PHACO Left 05/14/2013   Procedure: CATARACT EXTRACTION PHACO AND INTRAOCULAR LENS PLACEMENT (IOC);  Surgeon: Tonny Branch, MD;  Location: [facility];  Service: Ophthalmology;  Laterality: Left;  CDE:9.76  . CATARACT EXTRACTION W/PHACO Right 06/08/2013   Procedure: CATARACT EXTRACTION PHACO AND INTRAOCULAR LENS PLACEMENT (IOC);  Surgeon: Tonny Branch, MD;  Location: [facility];  Service: Ophthalmology;  Laterality: Right;  CDE 13.65  . CHOLECYSTECTOMY  1970  . COLONOSCOPY W/ POLYPECTOMY  2003   Dr [name]  . EP IMPLANTABLE DEVICE N/A 07/18/2016   Procedure: Pacemaker Implant;  Surgeon: Deboraha Sprang, MD;  Location: [facility];  Service: Cardiovascular;  Laterality: N/A;  . ESOPHAGEAL MANOMETRY N/A 10/05/2013   Procedure: ESOPHAGEAL MANOMETRY (EM);  Surgeon: Gatha Mayer, MD;  Location: [facility];  Service: Endoscopy;  Laterality: N/A;  . ESOPHAGOGASTRODUODENOSCOPY  02/05/13   Large Hiatal Hernia  . HERNIA REPAIR    . LAPAROSCOPIC NISSEN FUNDOPLICATION Bilateral 84/16/6063   Procedure: LAPAROSCOPIC NISSEN FUNDOPLICATION with  hiatal hernia repair ;  Surgeon: Edward Jolly, MD;  Location: [facility];  Service: General;  Laterality: Bilateral;    Family History  Problem Relation Age of Onset  . Heart attack Father 22       fatal MI @ 26  . Coronary artery disease Mother        CABG; MI @ 106  . Diabetes Mother   . Stroke Paternal Grandfather        > 51  . Stroke Maternal Grandmother        in 66s  . Aneurysm Paternal Grandmother        cns ; in 68s  . Diabetes Maternal Aunt   . Stomach cancer Maternal Aunt   . Breast cancer Maternal Aunt   .  Heart disease Sister   . Coronary artery disease Maternal Grandfather   . Colon cancer Neg Hx   . Esophageal cancer Neg Hx     Medications- reviewed and updated Current Outpatient Medications  Medication Sig Dispense Refill  . ALPRAZolam (XANAX) 0.25 MG tablet TAKE 1 TABLET BY MOUTH 2 TIMES DAILY AS NEEDED FOR ANXIETY. 30 tablet 2  . aspirin EC 81 MG tablet Take 81 mg by mouth daily.    . Calcium in Bone Mineral Cmplx 350 MG MISC Take 3 each by mouth at bedtime. Takes 1 in the morning and 2 at night    . carvedilol (COREG) 25 MG tablet Take 0.5 tablets (12.5 mg total) by mouth 2 (two) times daily. Annual appt due in June must see provider for future refills 180 tablet 0  . Cholecalciferol (VITAMIN D3) 2000 UNITS TABS Take 1 tablet by mouth daily with lunch.     . Cyanocobalamin (VITAMIN B 12 PO) Take 1 tablet by mouth daily with lunch.    . esomeprazole (NEXIUM) 40 MG capsule Take 1 capsule (40 mg total) by mouth 2 (two) times daily before a meal. See you at your July 22 appointment 180 capsule 3  . ezetimibe-simvastatin (VYTORIN) 10-20 MG tablet TAKE 1 TABLET BY MOUTH EVERYDAY AT BEDTIME 90 tablet 1  . famotidine (PEPCID) 40 MG tablet Take 1 tablet (40 mg total) by mouth at bedtime. 90 tablet 3  . folic acid (FOLVITE) 088 MCG tablet Take 400 mcg by mouth every morning.    . furosemide (LASIX) 40 MG tablet TAKE 1 TABLET BY MOUTH DAILY. ANNUAL APPT DUE IN JUNE MUST SEE PROVIDER FOR FUTURE REFILLS 90 tablet 0  . losartan (COZAAR) 100 MG tablet TAKE 1 TABLET BY MOUTH EVERY DAY 90 tablet 1  . Magnesium 400 MG CAPS Take 400 mg by mouth at bedtime.     . Omega-3 Fatty Acids (FISH OIL) 500 MG CAPS Take 500 mg by mouth daily with lunch.     . gabapentin (NEURONTIN) 100 MG capsule Take 1 capsule (100 mg total) by mouth every 8 (eight) hours as needed (headache). 30 capsule 2   No current facility-administered medications for this visit.    Allergies-reviewed and updated Allergies  Allergen  Reactions  . Chlorthalidone     01/09/13 creatinine 2.2  . Hydrochlorothiazide     Hypokalemia  . Losartan Potassium-Hctz     Hypokalemia  . Norvasc [Amlodipine Besylate]     Edema  . Raloxifene     HTN  . Rofecoxib     HTN    Social History   Social History Narrative   Married 19 years in 2021. Married to 1st husband 21 years  but died of massive MI. In 2021- Derrick at 47 and lost Louie Casa at age 76 to cancer- lung start.    Husband Jimmy patient of Dr. Yong Channel.       Worked for [employer] for 24 years.       Hobbies: has a friend she spends time with 1 day a week and goes out- shopping, eating, or even gambling   Objective  Objective:  BP 118/78   Pulse 90   Temp 98.1 F (36.7 C) (Temporal)   Ht 5' (1.524 m)   Wt 166 lb 6.4 oz (75.5 kg)   SpO2 93%   BMI 32.50 kg/m  Gen: NAD, resting comfortably HEENT: Mucous membranes are moist. Oropharynx normal Neck: no thyromegaly CV: RRR no murmurs rubs or gallops Breast: Seborrheic keratosis on right breast- slightly darker than flesh colored- she will monitor Lungs: CTAB no crackles, wheeze, rhonchi Abdomen: soft/nontender/nondistended/normal bowel sounds. No rebound or guarding.  Ext: no edema Skin: warm, dry Neuro: grossly normal, moves all extremities, PERRLA No pain with palpation over lower buttocks in area she gets pain in evenings- I suspec this is pressure related as better with position changes- continue to monitor   Assessment and Plan   84 y.o. female presenting for annual physical.  Health Maintenance counseling: 1. Anticipatory guidance: Patient counseled regarding regular dental exams -q6 months, eye exams -  Yearly or every other year,  avoiding smoking and second hand smoke , limiting alcohol to 1 beverage per day- way under this- rare glass of wine with a friend she goes out with- doesn't keep in house.  .    2. Risk factor reduction:  Advised patient of need for regular exercise and diet rich and  fruits and vegetables to reduce risk of heart attack and stroke. Exercise- trying to do some walking. Diet-reasonably healthy - trying to lose some weight. States down 20 lbs from her peak and has kept off over a year Wt Readings from Last 3 Encounters:  10/10/20 166 lb 6.4 oz (75.5 kg)  08/12/20 169 lb 12.8 oz (77 kg)  06/02/20 170 lb 6 oz (77.3 kg)  3. Immunizations/screenings/ancillary studies- fully up to date Immunization History  Administered Date(s) Administered  . Fluad Quad(high Dose 65+) 07/21/2019, 08/30/2020  . Influenza Split 10/08/2011  . Influenza Whole 09/06/2009, 10/02/2010  . Influenza, High Dose Seasonal PF 09/10/2013, 10/10/2015, 10/10/2016, 10/24/2017, 08/18/2018, 07/28/2019  . Influenza,inj,Quad PF,6+ Mos 10/04/2014  . Influenza-Unspecified 08/18/2018  . PFIZER SARS-COV-2 Vaccination 12/17/2019, 01/07/2020, 08/18/2020  . Pneumococcal Conjugate-13 10/25/2015  . Pneumococcal Polysaccharide-23 10/10/2010  . Tdap 10/10/2016  . Zoster 01/09/2013  . Zoster Recombinat (Shingrix) 07/21/2019, 09/13/2019, 10/16/2019  4. Cervical cancer screening- never had abnormal pap smear- passed age based screenings 5. Breast cancer screening-  Passed age based screening recommendations.  6. Colon cancer screening - reassuring 2014 she believes- was told no further colonoscopy 7. Skin cancer screening- does not see dermatology. advised regular sunscreen use. Denies worrisome, changing, or new skin lesions.  8. Birth control/STD check- not active at moment with husband or anyone else 88. Osteoporosis screening at 68- see below -Never smoker  Status of chronic or acute concerns   #Right hand/wrist carpal tunnel-patient with ongoing issues with carpal tunnel-particularly bad if she ends up laying on that side.  We discussed possible referral to sports medicine but she would like to monitor for now.  #Right fourth finger trigger finger-showed her how to do a double Band-Aid splint and I  would  like for her to do this regularly for the next 1 to 2 weeks.  If not improving I would like to send her to sports medicine where they can consider injection  #hypertension S: medication:  Carvedilol 25 mg BID , Lasix 40mg  daily, losartan 100mg  (some concern could worsen kidney function) A/P:Stable. Continue current medications.    #hyperlipidemia/aortic atherosclerosis-LDL goal at least under 100-ideally under 70 but not increasing medicine for primary prevention S: Medication:  fish oil, Vytorin 10-20mg , aspirin 81mg - no bleeding history and takes due to strong family history of coronary artery disease Lab Results  Component Value Date   CHOL 153 04/18/2020   HDL 44.30 04/18/2020   LDLCALC 91 04/18/2020   TRIG 88.0 04/18/2020   CHOLHDL 3 04/18/2020  A/P: Stable. Continue current medications. Will hold off on checking cholesterol today. Ideally LDL below 70 with her age but also cautious on increasing medication.    # Anxiety S:Medication:  Xanax once a month or so- very sparing and typically related to frustrations with her husband.  No falls or injuries reported  A/P: reasonable control - continue current medicine   #severe headaches years ago- was given gabapentin by Dr. Linna Darner after multiple other medication trials and it did improve symptoms. Has had headaches her entire life. Gabapentin 100mg  every 8 months perhaps-last fill 2015- will go ahead and fill  # Hyperglycemia/insulin resistance/prediabetes- peak a1c June 2020 at 6.4 S:  Medication: none Exercise and diet- trying to do some walking 5-10 minutes- back bothers her if she does more Lab Results  Component Value Date   HGBA1C 5.9 04/18/2020  A/P: hopefully stable- update a1c  #Osteoporosis S: On April 21, 2020 patient had a bone density in the left femoral neck of -2.4 T score.  Her 10-year hip fracture risk is 6% which places her in osteoporosis range.prior in 2015 had been -1.7.  Dr. Linna Darner also wanted her on  magnesium -She agrees to take calcium 1200 mg a day and vitamin D 800 units/day -History of severe reflux requiring high-dose PPI twice a day -For this reason we have opted to pursue Reclast instead of Fosamax with team looking into this 04/25/20 but ended up with worsening renal function and had to send back to [place] [facility] - no planned major dental work -Weightbearing exercise-encouraged her to start at May 2021 visit- doing some A/P: Osteoporosis with elevated hip fracture risk-we will plan to start Reclast if kidney function has improved- needs crcl above 30 or 35  % Chronic kidney disease Stage III-follows with Dr. Moshe Cipro S: knows to avoid nsaids. Tylenol only. PPI not ideal A/P: hopefully improved from our last check- update levels today.    % complete heart block- Led to pacemaker placement - follows with Dr. Caryl Comes yearly. q3 month checks at home   % GERD- follows with Dr. [name] -s/p hiatal hernia surgery- bad experience S: Nexium 40mg  BID. Now also on pepcid  On Apr 05, 2020 was reporting breakthrough symptoms-recommended GI follow-up  -Patient takes B12 on a regular basis to avoid suppression with Nexium A/P: doing much better with pepcid- continue current medicines.     # lower buttocks pain- worse with prolonged sitting at home (worse at night)- laying on her side seems to help at night. Changing seats regularly helps for most part Recommended follow up: Return in about 6 months (around 04/09/2021) for follow up- or sooner if needed. Future Appointments  Date Time Provider Craig  10/13/2020  8:40 AM [facility] DEVICE  REMOTES [facility]  01/12/2021  8:40 AM [facility] DEVICE REMOTES [facility]  04/13/2021  8:40 AM [facility] DEVICE REMOTES [facility]  07/13/2021  8:40 AM [facility] DEVICE REMOTES [facility]  10/12/2021  8:40 AM [facility] DEVICE REMOTES [facility]  01/11/2022  8:40 AM  [facility] DEVICE REMOTES [facility]  04/12/2022  8:40 AM [facility] DEVICE REMOTES [facility]   Lab/Order associations: fasting   ICD-10-CM   1. Preventative health care  Z00.00 CBC With Differential/Platelet    COMPLETE METABOLIC PANEL WITH GFR    Hemoglobin A1c  2. Essential hypertension  I10 CBC With Differential/Platelet    COMPLETE METABOLIC PANEL WITH GFR  3. Stage 3 chronic kidney disease, unspecified whether stage 3a or 3b CKD (HCC)  N18.30 CBC With Differential/Platelet    COMPLETE METABOLIC PANEL WITH GFR  4. Hyperglycemia  R73.9 Hemoglobin A1c  5. Aortic atherosclerosis (HCC) Chronic I70.0     Meds ordered this encounter  Medications  . gabapentin (NEURONTIN) 100 MG capsule    Sig: Take 1 capsule (100 mg total) by mouth every 8 (eight) hours as needed (headache).    Dispense:  30 capsule    Refill:  2    Return precautions advised.  Garret Reddish, MD

## 2020-10-11 ENCOUNTER — Other Ambulatory Visit: Payer: Self-pay

## 2020-10-11 DIAGNOSIS — M810 Age-related osteoporosis without current pathological fracture: Secondary | ICD-10-CM

## 2020-10-11 DIAGNOSIS — N183 Chronic kidney disease, stage 3 unspecified: Secondary | ICD-10-CM

## 2020-10-11 LAB — COMPLETE METABOLIC PANEL WITH GFR
AG Ratio: 1.6 (calc) (ref 1.0–2.5)
ALT: 17 U/L (ref 6–29)
AST: 24 U/L (ref 10–35)
Albumin: 4.1 g/dL (ref 3.6–5.1)
Alkaline phosphatase (APISO): 54 U/L (ref 37–153)
BUN/Creatinine Ratio: 14 (calc) (ref 6–22)
BUN: 23 mg/dL (ref 7–25)
CO2: 33 mmol/L — ABNORMAL HIGH (ref 20–32)
Calcium: 9.8 mg/dL (ref 8.6–10.4)
Chloride: 100 mmol/L (ref 98–110)
Creat: 1.67 mg/dL — ABNORMAL HIGH (ref 0.60–0.88)
GFR, Est African American: 33 mL/min/{1.73_m2} — ABNORMAL LOW (ref >=60)
GFR, Est Non African American: 28 mL/min/{1.73_m2} — ABNORMAL LOW (ref >=60)
Globulin: 2.6 g/dL (calc) (ref 1.9–3.7)
Glucose, Bld: 93 mg/dL (ref 65–99)
Potassium: 3.3 mmol/L — ABNORMAL LOW (ref 3.5–5.3)
Sodium: 143 mmol/L (ref 135–146)
Total Bilirubin: 0.7 mg/dL (ref 0.2–1.2)
Total Protein: 6.7 g/dL (ref 6.1–8.1)

## 2020-10-11 LAB — CBC WITH DIFFERENTIAL/PLATELET
Absolute Monocytes: 777 cells/uL (ref 200–950)
Basophils Absolute: 70 cells/uL (ref 0–200)
Basophils Relative: 1 %
Eosinophils Absolute: 189 cells/uL (ref 15–500)
Eosinophils Relative: 2.7 %
HCT: 38.6 % (ref 35.0–45.0)
Hemoglobin: 12.7 g/dL (ref 11.7–15.5)
Lymphs Abs: 2366 cells/uL (ref 850–3900)
MCH: 29.1 pg (ref 27.0–33.0)
MCHC: 32.9 g/dL (ref 32.0–36.0)
MCV: 88.3 fL (ref 80.0–100.0)
MPV: 11 fL (ref 7.5–12.5)
Monocytes Relative: 11.1 %
Neutro Abs: 3598 cells/uL (ref 1500–7800)
Neutrophils Relative %: 51.4 %
Platelets: 236 10*3/uL (ref 140–400)
RBC: 4.37 10*6/uL (ref 3.80–5.10)
RDW: 13 % (ref 11.0–15.0)
Total Lymphocyte: 33.8 %
WBC: 7 10*3/uL (ref 3.8–10.8)

## 2020-10-11 LAB — HEMOGLOBIN A1C
Hgb A1c MFr Bld: 5.7 % of total Hgb — ABNORMAL HIGH (ref <5.7)
Mean Plasma Glucose: 117 (calc)
eAG (mmol/L): 6.5 (calc)

## 2020-10-13 ENCOUNTER — Ambulatory Visit (INDEPENDENT_AMBULATORY_CARE_PROVIDER_SITE_OTHER): Payer: Medicare PPO

## 2020-10-13 DIAGNOSIS — I442 Atrioventricular block, complete: Secondary | ICD-10-CM

## 2020-10-14 LAB — CUP PACEART REMOTE DEVICE CHECK
Battery Remaining Longevity: 38 mo
Battery Voltage: 2.98 V
Brady Statistic AP VP Percent: 18.66 %
Brady Statistic AP VS Percent: 0 %
Brady Statistic AS VP Percent: 81.33 %
Brady Statistic AS VS Percent: 0.01 %
Brady Statistic RA Percent Paced: 18.64 %
Brady Statistic RV Percent Paced: 99.97 %
Date Time Interrogation Session: 20211202153020
Implantable Lead Implant Date: 20170906
Implantable Lead Implant Date: 20170906
Implantable Lead Location: 753859
Implantable Lead Location: 753860
Implantable Lead Model: 3830
Implantable Lead Model: 5076
Implantable Pulse Generator Implant Date: 20170906
Lead Channel Impedance Value: 342 Ohm
Lead Channel Impedance Value: 418 Ohm
Lead Channel Impedance Value: 437 Ohm
Lead Channel Impedance Value: 532 Ohm
Lead Channel Pacing Threshold Amplitude: 0.625 V
Lead Channel Pacing Threshold Amplitude: 1.625 V
Lead Channel Pacing Threshold Pulse Width: 0.4 ms
Lead Channel Pacing Threshold Pulse Width: 0.4 ms
Lead Channel Sensing Intrinsic Amplitude: 2.375 mV
Lead Channel Sensing Intrinsic Amplitude: 2.375 mV
Lead Channel Sensing Intrinsic Amplitude: 5.75 mV
Lead Channel Sensing Intrinsic Amplitude: 5.75 mV
Lead Channel Setting Pacing Amplitude: 2 V
Lead Channel Setting Pacing Amplitude: 2.5 V
Lead Channel Setting Pacing Pulse Width: 1 ms
Lead Channel Setting Sensing Sensitivity: 0.9 mV

## 2020-10-25 NOTE — Progress Notes (Signed)
Remote pacemaker transmission.   

## 2020-10-30 ENCOUNTER — Other Ambulatory Visit: Payer: Self-pay | Admitting: Internal Medicine

## 2020-10-30 NOTE — Telephone Encounter (Signed)
Please refill as per office routine med refill policy (all routine meds refilled for 3 mo or monthly per pt preference up to one year from last visit, then month to month grace period for 3 mo, then further med refills will have to be denied)  

## 2020-11-01 ENCOUNTER — Other Ambulatory Visit: Payer: Self-pay

## 2020-11-01 ENCOUNTER — Telehealth: Payer: Self-pay

## 2020-11-01 MED ORDER — CARVEDILOL 25 MG PO TABS: 12.5000 mg | ORAL_TABLET | Freq: Two times a day (BID) | ORAL | 1 refills | Status: DC

## 2020-11-01 NOTE — Telephone Encounter (Signed)
.   LAST APPOINTMENT DATE: 10/10/2020   NEXT APPOINTMENT DATE:@5 /20/2022  MEDICATION:carvedilol (COREG) 25 MG tablet  PHARMACY:[id number] - [place], [place] - [address]  CLINICAL FILLS OUT ALL BELOW:   LAST REFILL:  QTY:  REFILL DATE:    OTHER COMMENTS: Patient states dr.johns office is not going to refill RX and would like [place] to write her a new RX and send it in to pharmacy    Okay for refill?  Please advise

## 2020-11-01 NOTE — Telephone Encounter (Signed)
Mediation has been refilled and sent to pharmacy.

## 2020-11-01 NOTE — Telephone Encounter (Signed)
Patient has switched PCPs

## 2020-11-10 ENCOUNTER — Other Ambulatory Visit: Payer: Self-pay | Admitting: Family Medicine

## 2020-11-10 DIAGNOSIS — I10 Essential (primary) hypertension: Secondary | ICD-10-CM

## 2020-12-19 ENCOUNTER — Other Ambulatory Visit: Payer: Self-pay | Admitting: Internal Medicine

## 2020-12-19 ENCOUNTER — Other Ambulatory Visit: Payer: Self-pay | Admitting: Family Medicine

## 2020-12-29 DIAGNOSIS — M810 Age-related osteoporosis without current pathological fracture: Secondary | ICD-10-CM | POA: Diagnosis not present

## 2021-01-12 ENCOUNTER — Ambulatory Visit (INDEPENDENT_AMBULATORY_CARE_PROVIDER_SITE_OTHER): Payer: Medicare PPO

## 2021-01-12 DIAGNOSIS — I442 Atrioventricular block, complete: Secondary | ICD-10-CM

## 2021-01-13 LAB — CUP PACEART REMOTE DEVICE CHECK
Battery Remaining Longevity: 34 mo
Battery Voltage: 2.97 V
Brady Statistic AP VP Percent: 23.49 %
Brady Statistic AP VS Percent: 0 %
Brady Statistic AS VP Percent: 76.5 %
Brady Statistic AS VS Percent: 0.01 %
Brady Statistic RA Percent Paced: 23.46 %
Brady Statistic RV Percent Paced: 99.97 %
Date Time Interrogation Session: 20220303132600
Implantable Lead Implant Date: 20170906
Implantable Lead Implant Date: 20170906
Implantable Lead Location: 753859
Implantable Lead Location: 753860
Implantable Lead Model: 3830
Implantable Lead Model: 5076
Implantable Pulse Generator Implant Date: 20170906
Lead Channel Impedance Value: 304 Ohm
Lead Channel Impedance Value: 399 Ohm
Lead Channel Impedance Value: 437 Ohm
Lead Channel Impedance Value: 513 Ohm
Lead Channel Pacing Threshold Amplitude: 0.5 V
Lead Channel Pacing Threshold Amplitude: 1.625 V
Lead Channel Pacing Threshold Pulse Width: 0.4 ms
Lead Channel Pacing Threshold Pulse Width: 0.4 ms
Lead Channel Sensing Intrinsic Amplitude: 2.25 mV
Lead Channel Sensing Intrinsic Amplitude: 2.25 mV
Lead Channel Sensing Intrinsic Amplitude: 3.875 mV
Lead Channel Sensing Intrinsic Amplitude: 3.875 mV
Lead Channel Setting Pacing Amplitude: 2 V
Lead Channel Setting Pacing Amplitude: 2.5 V
Lead Channel Setting Pacing Pulse Width: 1 ms
Lead Channel Setting Sensing Sensitivity: 0.9 mV

## 2021-01-24 NOTE — Progress Notes (Signed)
Remote pacemaker transmission.   

## 2021-02-10 DIAGNOSIS — I129 Hypertensive chronic kidney disease with stage 1 through stage 4 chronic kidney disease, or unspecified chronic kidney disease: Secondary | ICD-10-CM | POA: Diagnosis not present

## 2021-02-10 DIAGNOSIS — N183 Chronic kidney disease, stage 3 unspecified: Secondary | ICD-10-CM | POA: Diagnosis not present

## 2021-02-10 DIAGNOSIS — M810 Age-related osteoporosis without current pathological fracture: Secondary | ICD-10-CM | POA: Diagnosis not present

## 2021-02-10 DIAGNOSIS — I10 Essential (primary) hypertension: Secondary | ICD-10-CM | POA: Diagnosis not present

## 2021-02-10 DIAGNOSIS — N189 Chronic kidney disease, unspecified: Secondary | ICD-10-CM | POA: Diagnosis not present

## 2021-02-10 LAB — BASIC METABOLIC PANEL
BUN: 25 — AB (ref 4–21)
CO2: 29 — AB (ref 13–22)
Chloride: 103 (ref 99–108)
Creatinine: 1.4 — AB (ref 0.5–1.1)
Glucose: 96
Potassium: 3.2 — AB (ref 3.4–5.3)
Sodium: 142 (ref 137–147)

## 2021-02-10 LAB — COMPREHENSIVE METABOLIC PANEL
Albumin: 4.1 (ref 3.5–5.0)
Calcium: 9.8 (ref 8.7–10.7)

## 2021-02-10 LAB — IRON,TIBC AND FERRITIN PANEL
Ferritin: 61
UIBC: 229

## 2021-02-10 LAB — CBC AND DIFFERENTIAL: Hemoglobin: 12.5 (ref 12.0–16.0)

## 2021-02-16 ENCOUNTER — Encounter: Payer: Self-pay | Admitting: Family Medicine

## 2021-03-30 NOTE — Patient Instructions (Addendum)
Team please set patient up with prolia injections every 6 months for osteoporosis  #bilateral low back pain with possible sciatica - also with pain into both buttocks- now noting some burning in her lower legs at times- we are going to try a lower dose of prednisone with her prediabetes. Refer to sports medicine if not improving within 2 weeks for their opinion and potential imaging - prednisone not the best for osteoporosis but will trial low dose  Please stop by lab before you go If you have mychart- we will send your results within 3 business days of [place] receiving them.  If you do not have mychart- we will call you about results within 5 business days of [place] receiving them.  *please also note that you will see labs on mychart as soon as they post. I will later go in and write notes on them- will say "notes from Dr. Yong Channel"    Recommended follow up: Return in about 6 months (around 10/01/2021) for physical or sooner if needed.

## 2021-03-30 NOTE — Progress Notes (Signed)
Phone [phone] In person visit   Subjective:   Kylie Patrick is a 85 y.o. year old very pleasant female patient who presents for/with See problem oriented charting Chief Complaint  Patient presents with  . Hypertension  . Hyperlipidemia  . Hyperglycemia  . Anxiety  . Osteoporosis  . Numbness    In her foot going up to her ankle    This visit occurred during the SARS-CoV-2 public health emergency.  Safety protocols were in place, including screening questions prior to the visit, additional usage of staff PPE, and extensive cleaning of exam room while observing appropriate contact time as indicated for disinfecting solutions.   Past Medical History-  Patient Active Problem List   Diagnosis Date Noted  . Complete heart block ([place]) 04/05/2020    Priority: High  . Osteoporosis 04/25/2020    Priority: Medium  . Right carpal tunnel syndrome 05/02/2019    Priority: Medium  . Diastolic dysfunction 27/25/3664    Priority: Medium  . Hyperglycemia 10/09/2014    Priority: Medium  . GERD (gastroesophageal reflux disease) 10/22/2013    Priority: Medium  . CKD (chronic kidney disease) stage 3, GFR 30-59 ml/min (HCC) 05/05/2008    Priority: Medium  . Anxiety state 04/20/2008    Priority: Medium  . HYPERLIPIDEMIA 08/12/2007    Priority: Medium  . Essential hypertension 08/12/2007    Priority: Medium  . Rash 07/04/2017    Priority: Low  . Hiatal hernia 08/03/2013    Priority: Low  . Personal history of colonic polyps 01/09/2013    Priority: Low  . NIGHT SWEATS 02/19/2008    Priority: Low  . Pacemaker - MDT 08/11/2020  . Aortic atherosclerosis ([place]) 04/26/2020    Medications- reviewed and updated Current Outpatient Medications  Medication Sig Dispense Refill  . ALPRAZolam (XANAX) 0.25 MG tablet TAKE 1 TABLET BY MOUTH 2 TIMES DAILY AS NEEDED FOR ANXIETY. 30 tablet 2  . aspirin EC 81 MG tablet Take 81 mg by mouth daily.    . Calcium in Bone Mineral Cmplx 350 MG MISC Take 3  each by mouth at bedtime. Takes 1 in the morning and 2 at night    . carvedilol (COREG) 25 MG tablet Take 0.5 tablets (12.5 mg total) by mouth 2 (two) times daily. Annual appt due in June must see provider for future refills 90 tablet 1  . Cholecalciferol (VITAMIN D3) 2000 UNITS TABS Take 1 tablet by mouth daily with lunch.    . Cyanocobalamin (VITAMIN B 12 PO) Take 1 tablet by mouth daily with lunch.    . esomeprazole (NEXIUM) 40 MG capsule TAKE 1 CAPSULE BY MOUTH TWICE A DAY BEFORE A MEAL 180 capsule 3  . ezetimibe-simvastatin (VYTORIN) 10-20 MG tablet TAKE 1 TABLET BY MOUTH EVERYDAY AT BEDTIME 90 tablet 1  . famotidine (PEPCID) 40 MG tablet Take 1 tablet (40 mg total) by mouth at bedtime. 90 tablet 3  . folic acid (FOLVITE) 403 MCG tablet Take 400 mcg by mouth every morning.    . furosemide (LASIX) 40 MG tablet TAKE 1 TABLET BY MOUTH DAILY. ANNUAL APPT DUE IN JUNE MUST SEE PROVIDER FOR FUTURE REFILLS 90 tablet 0  . gabapentin (NEURONTIN) 100 MG capsule Take 1 capsule (100 mg total) by mouth every 8 (eight) hours as needed (headache). 30 capsule 2  . losartan (COZAAR) 50 MG tablet Take 50 mg by mouth daily. Per Dr. Moshe Cipro reduced from 100mg     . Magnesium 400 MG CAPS Take 400 mg by mouth  at bedtime.    . Omega-3 Fatty Acids (FISH OIL) 500 MG CAPS Take 500 mg by mouth daily with lunch.     No current facility-administered medications for this visit.     Objective:  BP 132/86   Pulse 74   Temp (!) 97 F (36.1 C) (Temporal)   Ht 5' (1.524 m)   Wt 161 lb 9.6 oz (73.3 kg)   SpO2 96%   BMI 31.56 kg/m  Gen: NAD, resting comfortably CV: RRR no murmurs rubs or gallops Lungs: CTAB no crackles, wheeze, rhonchi Ext: no edema Skin: warm, dry MSK: patient reports tenderness in bilateral low back radiating to thighs. Reports burning over left ankle. Small slightly raised area on left foot (possible lipoma) . Normal strength in legs per her baselien    Assessment and Plan   #social  update- husband with bladder cancer and was in [facility]- has gotten home but needs more attention- has had some falls- advised 100% of time using walker (not falling with this)   #bilateral low back pain with possible sciatica - also with pain into both buttocks- now noting some burning in her lower legs at times- we are going to try a lower dose of prednisone with her prediabetes. Refer to sports medicine if not improving within 2 weeks for their opinion and potential imaging - prednisone not the best for osteoporosis but will trial low dose  #hypertension S: medication:  Carvedilol 12.5 mg BID , Lasix 40mg  daily, losartan 50mg  (reduced by Dr. Moshe Cipro from 100mg )  BP Readings from Last 3 Encounters:  03/31/21 132/86  10/10/20 118/78  08/12/20 (!) 112/98   A/P:reasonable control- continue current meds   #hyperlipidemia/aortic atherosclerosis-LDL goal at least under 100-ideally under 70 but not increasing medicine for primary prevention S: Medication:  fish oil, Vytorin 10-20mg , aspirin 81mg - no bleeding history and takes due to strong family history of coronary artery disease Lab Results  Component Value Date   CHOL 153 04/18/2020   HDL 44.30 04/18/2020   LDLCALC 91 04/18/2020   TRIG 88.0 04/18/2020   CHOLHDL 3 04/18/2020   A/P: Reasonable control for primary prevention given over age 34-well-tolerated LDL under 100-continue current medications  # Anxiety S:Medication:  Xanax once a month or so- very sparing and typically related to frustrations with her husband- with him home from [place] home some increased stress after bladder cancer/severe anemia.  No falls or injuries reported  A/P:  Doing well with sparing dose- last refill 2020 and refill at this time.    #severe headaches years ago- was given gabapentin by Dr. Linna Darner after multiple other medication trials and it did improve symptoms. Has had headaches her entire life. Gabapentin 100mg  every 8 months  perhaps-I would be willing to refill- we did this last time but she hasnt needed   # Hyperglycemia/insulin resistance/prediabetes- peak a1c June 2020 at 6.4 S:  Medication: none Exercise and diet- trying to eat healthy. Exercise limited by caregiving  Lab Results  Component Value Date   HGBA1C 5.7 (H) 10/10/2020   HGBA1C 5.9 04/18/2020   HGBA1C 5.9 11/03/2019  A/P: Well-controlled on last check-update A1c with labs today  #Osteoporosis S: On April 21, 2020 patient had a bone density in the left femoral neck of -2.4 T score.  Her 10-year hip fracture risk is 6% which places her in osteoporosis range.prior in 2015 had been -1.7.  Dr. Linna Darner also wanted her on magnesium -She agrees to take calcium 20 mg a  day and vitamin D 800 units/day -History of severe reflux requiring high-dose PPI twice a day -For this reason we have opted to pursue Reclast but patient's renal function did not allow this and we ultimately referred to [facility] osteoporosis clinic for their opinion - no planned major dental work -Weightbearing exercise-encouraged her to start at May 2021 visit A/P: Osteoporosis based on elevated hip fracture risk at 6%-now working with Rob Hickman  -already on calcium and vitamin D -options given reclast 2.5 mg q6 months or prolia - we opted to do prolia but will need to continue long term  % Chronic kidney disease Stage III-follows with Dr. Moshe Cipro S: GFR typically very low 30s.  Knows to avoid nsaids.  A/P: stable on recent check- losartan was decreased   % complete heart block- Led to pacemaker placement - follows with Dr. Caryl Comes yearly- sees in october. q3 month checks at home -patient denies recent issues  % GERD- follows with Dr. [name] -s/p hiatal hernia surgery- bad experience S: Nexium 40mg  BID.  Also on pepcid  On Apr 05, 2020 was reporting breakthrough symptoms-recommended GI follow-up  -Patient takes B12 on a regular basis to avoid suppression with Nexium A/P: doing well  recently- continue to monitor    Recommended follow up: Return in about 6 months (around 10/01/2021) for physical or sooner if needed. Future Appointments  Date Time Provider [place]  04/13/2021  8:40 AM [facility] DEVICE REMOTES [facility]  07/13/2021  8:40 AM [facility] DEVICE REMOTES [facility]  08/15/2021  1:45 PM Deboraha Sprang, MD [facility]  10/12/2021  8:40 AM [facility] DEVICE REMOTES [facility]  01/11/2022  8:40 AM [facility] DEVICE REMOTES [facility]  04/12/2022  8:40 AM [facility] DEVICE REMOTES [facility]    Lab/Order associations:   ICD-10-CM   1. Essential hypertension  I10   2. Stage 3 chronic kidney disease, unspecified whether stage 3a or 3b CKD (HCC)  N18.30   3. HYPERLIPIDEMIA  E78.2   4. Hyperglycemia  R73.9   5. Anxiety state  F41.1   6. Aortic atherosclerosis (HCC) Chronic I70.0   7. Complete heart block (HCC) Chronic I44.2     Return precautions advised.  Garret Reddish, MD

## 2021-03-31 ENCOUNTER — Other Ambulatory Visit: Payer: Self-pay

## 2021-03-31 ENCOUNTER — Encounter: Payer: Self-pay | Admitting: Family Medicine

## 2021-03-31 ENCOUNTER — Ambulatory Visit: Payer: Medicare PPO | Admitting: Family Medicine

## 2021-03-31 VITALS — BP 132/86 | HR 74 | Temp 97.0°F | Ht 60.0 in | Wt 161.6 lb

## 2021-03-31 DIAGNOSIS — I700 Atherosclerosis of aorta: Secondary | ICD-10-CM | POA: Diagnosis not present

## 2021-03-31 DIAGNOSIS — I442 Atrioventricular block, complete: Secondary | ICD-10-CM | POA: Diagnosis not present

## 2021-03-31 DIAGNOSIS — R739 Hyperglycemia, unspecified: Secondary | ICD-10-CM

## 2021-03-31 DIAGNOSIS — E782 Mixed hyperlipidemia: Secondary | ICD-10-CM | POA: Diagnosis not present

## 2021-03-31 DIAGNOSIS — N183 Chronic kidney disease, stage 3 unspecified: Secondary | ICD-10-CM

## 2021-03-31 DIAGNOSIS — F411 Generalized anxiety disorder: Secondary | ICD-10-CM

## 2021-03-31 DIAGNOSIS — I10 Essential (primary) hypertension: Secondary | ICD-10-CM | POA: Diagnosis not present

## 2021-03-31 LAB — COMPREHENSIVE METABOLIC PANEL
ALT: 16 U/L (ref 0–35)
AST: 21 U/L (ref 0–37)
Albumin: 4.1 g/dL (ref 3.5–5.2)
Alkaline Phosphatase: 43 U/L (ref 39–117)
BUN: 22 mg/dL (ref 6–23)
CO2: 28 mEq/L (ref 19–32)
Calcium: 9.8 mg/dL (ref 8.4–10.5)
Chloride: 106 mEq/L (ref 96–112)
Creatinine, Ser: 1.32 mg/dL — ABNORMAL HIGH (ref 0.40–1.20)
GFR: 37.5 mL/min — ABNORMAL LOW (ref >60.00)
Glucose, Bld: 87 mg/dL (ref 70–99)
Potassium: 3.5 mEq/L (ref 3.5–5.1)
Sodium: 143 mEq/L (ref 135–145)
Total Bilirubin: 0.7 mg/dL (ref 0.2–1.2)
Total Protein: 6.6 g/dL (ref 6.0–8.3)

## 2021-03-31 LAB — CBC WITH DIFFERENTIAL/PLATELET
Basophils Absolute: 0 10*3/uL (ref 0.0–0.1)
Basophils Relative: 0.7 % (ref 0.0–3.0)
Eosinophils Absolute: 0.2 10*3/uL (ref 0.0–0.7)
Eosinophils Relative: 3.1 % (ref 0.0–5.0)
HCT: 36.9 % (ref 36.0–46.0)
Hemoglobin: 12.4 g/dL (ref 12.0–15.0)
Lymphocytes Relative: 28.8 % (ref 12.0–46.0)
Lymphs Abs: 1.8 10*3/uL (ref 0.7–4.0)
MCHC: 33.7 g/dL (ref 30.0–36.0)
MCV: 88.8 fl (ref 78.0–100.0)
Monocytes Absolute: 0.7 10*3/uL (ref 0.1–1.0)
Monocytes Relative: 10.5 % (ref 3.0–12.0)
Neutro Abs: 3.6 10*3/uL (ref 1.4–7.7)
Neutrophils Relative %: 56.9 % (ref 43.0–77.0)
Platelets: 210 10*3/uL (ref 150.0–400.0)
RBC: 4.15 Mil/uL (ref 3.87–5.11)
RDW: 14.7 % (ref 11.5–15.5)
WBC: 6.3 10*3/uL (ref 4.0–10.5)

## 2021-03-31 LAB — HEMOGLOBIN A1C: Hgb A1c MFr Bld: 5.8 % (ref 4.6–6.5)

## 2021-03-31 MED ORDER — PREDNISONE 20 MG PO TABS: ORAL_TABLET | ORAL | 0 refills | Status: DC

## 2021-03-31 MED ORDER — ALPRAZOLAM 0.25 MG PO TABS: 0.2500 mg | ORAL_TABLET | Freq: Two times a day (BID) | ORAL | 2 refills | Status: DC | PRN

## 2021-04-04 ENCOUNTER — Other Ambulatory Visit: Payer: Self-pay | Admitting: Family Medicine

## 2021-04-13 ENCOUNTER — Ambulatory Visit (INDEPENDENT_AMBULATORY_CARE_PROVIDER_SITE_OTHER): Payer: Medicare PPO

## 2021-04-13 DIAGNOSIS — I442 Atrioventricular block, complete: Secondary | ICD-10-CM | POA: Diagnosis not present

## 2021-04-15 LAB — CUP PACEART REMOTE DEVICE CHECK
Battery Remaining Longevity: 30 mo
Battery Voltage: 2.96 V
Brady Statistic AP VP Percent: 24.8 %
Brady Statistic AP VS Percent: 0 %
Brady Statistic AS VP Percent: 75.16 %
Brady Statistic AS VS Percent: 0.04 %
Brady Statistic RA Percent Paced: 24.75 %
Brady Statistic RV Percent Paced: 99.87 %
Date Time Interrogation Session: 20220603170937
Implantable Lead Implant Date: 20170906
Implantable Lead Implant Date: 20170906
Implantable Lead Location: 753859
Implantable Lead Location: 753860
Implantable Lead Model: 3830
Implantable Lead Model: 5076
Implantable Pulse Generator Implant Date: 20170906
Lead Channel Impedance Value: 323 Ohm
Lead Channel Impedance Value: 399 Ohm
Lead Channel Impedance Value: 418 Ohm
Lead Channel Impedance Value: 513 Ohm
Lead Channel Pacing Threshold Amplitude: 0.5 V
Lead Channel Pacing Threshold Amplitude: 1.75 V
Lead Channel Pacing Threshold Pulse Width: 0.4 ms
Lead Channel Pacing Threshold Pulse Width: 0.4 ms
Lead Channel Sensing Intrinsic Amplitude: 2.75 mV
Lead Channel Sensing Intrinsic Amplitude: 2.75 mV
Lead Channel Sensing Intrinsic Amplitude: 5.25 mV
Lead Channel Sensing Intrinsic Amplitude: 5.25 mV
Lead Channel Setting Pacing Amplitude: 2 V
Lead Channel Setting Pacing Amplitude: 2.5 V
Lead Channel Setting Pacing Pulse Width: 1 ms
Lead Channel Setting Sensing Sensitivity: 0.9 mV

## 2021-04-26 ENCOUNTER — Encounter: Payer: Self-pay | Admitting: Family Medicine

## 2021-04-26 ENCOUNTER — Other Ambulatory Visit: Payer: Self-pay

## 2021-04-26 ENCOUNTER — Ambulatory Visit (INDEPENDENT_AMBULATORY_CARE_PROVIDER_SITE_OTHER): Payer: Medicare PPO | Admitting: Family Medicine

## 2021-04-26 VITALS — BP 110/80 | HR 73 | Temp 98.1°F | Ht 59.0 in | Wt 157.4 lb

## 2021-04-26 DIAGNOSIS — R102 Pelvic and perineal pain: Secondary | ICD-10-CM | POA: Diagnosis not present

## 2021-04-26 LAB — URINALYSIS, MICROSCOPIC ONLY

## 2021-04-26 MED ORDER — AMOXICILLIN 875 MG PO TABS: 875.0000 mg | ORAL_TABLET | Freq: Two times a day (BID) | ORAL | 0 refills | Status: AC

## 2021-04-26 NOTE — Progress Notes (Signed)
Same day acute visit; PCP not available. New pt to me. Chart reviewed.   Subjective  CC:  Chief Complaint  Patient presents with   Abdominal Pain    Took AZO for 2 days (Monday and Tuesday) Started Sunday night.     HPI: Kylie Patrick is a 85 y.o. female who presents to the office today to address the problems listed above in the chief complaint. Very pleasant 85 year old female reports 2-day history of suprapubic pain.  It was moderate on Monday.  Pain even with walking.  No radiation of pain.  No radicular symptoms.  She takes Lasix and thus makes it hard for her to know if she has increased urinary frequency.  However, she denies dysuria or change in odor or appearance of urine.  She has had a urinary tract infection in the past and reports this pain is similar in nature.  She denies GI symptoms including nausea, vomiting, constipation or diarrhea.  She has no other abdominal pain.  No fevers or chills.  Pain has improved since Monday.  However there is still mild discomfort.  She has used Azo over the last 3 days.  She thinks this has been helpful.  She denies urinary incontinence.  Assessment  1. Suprapubic pain      Plan  Suprapubic pain: Symptom complex most consistent with acute UTI, improving.  Will await urine microscopy and urine culture.  Treat empirically with antibiotics.  Follow-up if not improving.  Follow up:   10/02/2021   Orders Placed This Encounter  Procedures   Urine Culture   Urine Microscopic Only   No orders of the defined types were placed in this encounter.     I reviewed the patients updated PMH, FH, and SocHx.    Patient Active Problem List   Diagnosis Date Noted   Pacemaker - MDT 08/11/2020   Aortic atherosclerosis ([place]) 04/26/2020   Osteoporosis 04/25/2020   Complete heart block ([place]) 04/05/2020   Right carpal tunnel syndrome 05/02/2019   Rash 98/33/8250   Diastolic dysfunction 53/97/6734   Hyperglycemia 10/09/2014   GERD  (gastroesophageal reflux disease) 10/22/2013   Hiatal hernia 08/03/2013   Personal history of colonic polyps 01/09/2013   CKD (chronic kidney disease) stage 3, GFR 30-59 ml/min (HCC) 05/05/2008   Anxiety state 04/20/2008   NIGHT SWEATS 02/19/2008   HYPERLIPIDEMIA 08/12/2007   Essential hypertension 08/12/2007   Current Meds  Medication Sig   ALPRAZolam (XANAX) 0.25 MG tablet Take 1 tablet (0.25 mg total) by mouth 2 (two) times daily as needed for anxiety.   aspirin EC 81 MG tablet Take 81 mg by mouth daily.   Calcium in Bone Mineral Cmplx 350 MG MISC Take 3 each by mouth at bedtime. Takes 1 in the morning and 2 at night   carvedilol (COREG) 25 MG tablet Take 0.5 tablets (12.5 mg total) by mouth 2 (two) times daily. Annual appt due in June must see provider for future refills   Cholecalciferol (VITAMIN D3) 2000 UNITS TABS Take 1 tablet by mouth daily with lunch.   esomeprazole (NEXIUM) 40 MG capsule TAKE 1 CAPSULE BY MOUTH TWICE A DAY BEFORE A MEAL   ezetimibe-simvastatin (VYTORIN) 10-20 MG tablet TAKE 1 TABLET BY MOUTH EVERYDAY AT BEDTIME   famotidine (PEPCID) 40 MG tablet Take 1 tablet (40 mg total) by mouth at bedtime.   folic acid (FOLVITE) 193 MCG tablet Take 400 mcg by mouth every morning.   furosemide (LASIX) 40 MG tablet TAKE 1 TABLET BY  MOUTH EVERY DAY NEEDS APPT FOR REFILLS   gabapentin (NEURONTIN) 100 MG capsule Take 1 capsule (100 mg total) by mouth every 8 (eight) hours as needed (headache).   losartan (COZAAR) 50 MG tablet Take 50 mg by mouth daily. Per Dr. Moshe Cipro reduced from 100mg    Magnesium 400 MG CAPS Take 400 mg by mouth at bedtime.   Omega-3 Fatty Acids (FISH OIL) 500 MG CAPS Take 500 mg by mouth daily with lunch.    Allergies: Patient is allergic to chlorthalidone, hydrochlorothiazide, losartan potassium-hctz, norvasc [amlodipine besylate], raloxifene, and rofecoxib. Family History: Patient family history includes Aneurysm in her paternal grandmother; Breast  cancer in her maternal aunt; Coronary artery disease in her maternal grandfather and mother; Diabetes in her maternal aunt and mother; Heart attack (age of onset: 7) in her father; Heart disease in her sister; Stomach cancer in her maternal aunt; Stroke in her maternal grandmother and paternal grandfather. Social History:  Patient  reports that she has never smoked. She has never used smokeless tobacco. She reports current alcohol use. She reports that she does not use drugs.  Review of Systems: Constitutional: Negative for fever malaise or anorexia Cardiovascular: negative for chest pain Respiratory: negative for SOB or persistent cough Gastrointestinal: negative for abdominal pain  Objective  Vitals: BP 110/80   Pulse 73   Temp 98.1 F (36.7 C) (Temporal)   Ht 4\' 11"  (1.499 m)   Wt 157 lb 6.4 oz (71.4 kg)   SpO2 97%   BMI 31.79 kg/m  General: no acute distress , A&Ox3, appears well HEENT: PEERL, conjunctiva normal, neck is supple Cardiovascular:  RRR without murmur or gallop.  Respiratory:  Good breath sounds bilaterally, CTAB with normal respiratory effort Abdomen: Soft, very minimal suprapubic tenderness without rebound or guarding.  No CVA tenderness, Skin:  Warm, no rashes    Commons side effects, risks, benefits, and alternatives for medications and treatment plan prescribed today were discussed, and the patient expressed understanding of the given instructions. Patient is instructed to call or message via MyChart if he/she has any questions or concerns regarding our treatment plan. No barriers to understanding were identified. We discussed Red Flag symptoms and signs in detail. Patient expressed understanding regarding what to do in case of urgent or emergency type symptoms.  Medication list was reconciled, printed and provided to the patient in AVS. Patient instructions and summary information was reviewed with the patient as documented in the AVS. This note was prepared with  assistance of Dragon voice recognition software. Occasional wrong-word or sound-a-like substitutions may have occurred due to the inherent limitations of voice recognition software  This visit occurred during the SARS-CoV-2 public health emergency.  Safety protocols were in place, including screening questions prior to the visit, additional usage of staff PPE, and extensive cleaning of exam room while observing appropriate contact time as indicated for disinfecting solutions.

## 2021-04-26 NOTE — Patient Instructions (Signed)
Please follow up if symptoms do not improve or as needed.    Take the antibiotics as ordered; please let me know if your symptoms are not improving by Friday.  We will let you know the results of your urine tests in a day or two.   Urinary Tract Infection, Adult  A urinary tract infection (UTI) is an infection of any part of the urinary tract. The urinary tract includes the kidneys, ureters, bladder, and urethra.These organs make, store, and get rid of urine in the body. An upper UTI affects the ureters and kidneys. A lower UTI affects the bladderand urethra. What are the causes? Most urinary tract infections are caused by bacteria in your genital area around your urethra, where urine leaves your body. These bacteria grow andcause inflammation of your urinary tract. What increases the risk? You are more likely to develop this condition if: You have a urinary catheter that stays in place. You are not able to control when you urinate or have a bowel movement (incontinence). You are female and you: Use a spermicide or diaphragm for birth control. Have low estrogen levels. Are pregnant. You have certain genes that increase your risk. You are sexually active. You take antibiotic medicines. You have a condition that causes your flow of urine to slow down, such as: An enlarged prostate, if you are female. Blockage in your urethra. A kidney stone. A nerve condition that affects your bladder control (neurogenic bladder). Not getting enough to drink, or not urinating often. You have certain medical conditions, such as: Diabetes. A weak disease-fighting system (immunesystem). Sickle cell disease. Gout. Spinal cord injury. What are the signs or symptoms? Symptoms of this condition include: Needing to urinate right away (urgency). Frequent urination. This may include small amounts of urine each time you urinate. Pain or burning with urination. Blood in the urine. Urine that smells bad or  unusual. Trouble urinating. Cloudy urine. Vaginal discharge, if you are female. Pain in the abdomen or the lower back. You may also have: Vomiting or a decreased appetite. Confusion. Irritability or tiredness. A fever or chills. Diarrhea. The first symptom in older adults may be confusion. In some cases, they may nothave any symptoms until the infection has worsened. How is this diagnosed? This condition is diagnosed based on your medical history and a physical exam. You may also have other tests, including: Urine tests. Blood tests. Tests for STIs (sexually transmitted infections). If you have had more than one UTI, a cystoscopy or imaging studies may be doneto determine the cause of the infections. How is this treated? Treatment for this condition includes: Antibiotic medicine. Over-the-counter medicines to treat discomfort. Drinking enough water to stay hydrated. If you have frequent infections or have other conditions such as a kidney stone, you may need to see a health care provider who specializes in the urinary tract (urologist). In rare cases, urinary tract infections can cause sepsis. Sepsis is a life-threatening condition that occurs when the body responds to an infection. Sepsis is treated in the hospital with IV antibiotics, fluids, and othermedicines. Follow these instructions at home:  Medicines Take over-the-counter and prescription medicines only as told by your health care provider. If you were prescribed an antibiotic medicine, take it as told by your health care provider. Do not stop using the antibiotic even if you start to feel better. General instructions Make sure you: Empty your bladder often and completely. Do not hold urine for long periods of time. Empty your bladder after sex.  Wipe from front to back after urinating or having a bowel movement if you are female. Use each tissue only one time when you wipe. Drink enough fluid to keep your urine pale  yellow. Keep all follow-up visits. This is important. Contact a health care provider if: Your symptoms do not get better after 1-2 days. Your symptoms go away and then return. Get help right away if: You have severe pain in your back or your lower abdomen. You have a fever or chills. You have nausea or vomiting. Summary A urinary tract infection (UTI) is an infection of any part of the urinary tract, which includes the kidneys, ureters, bladder, and urethra. Most urinary tract infections are caused by bacteria in your genital area. Treatment for this condition often includes antibiotic medicines. If you were prescribed an antibiotic medicine, take it as told by your health care provider. Do not stop using the antibiotic even if you start to feel better. Keep all follow-up visits. This is important. This information is not intended to replace advice given to you by your health care provider. Make sure you discuss any questions you have with your healthcare provider. Document Revised: 06/10/2020 Document Reviewed: 06/10/2020 Elsevier Patient Education  [place].

## 2021-04-27 LAB — URINE CULTURE
MICRO NUMBER:: 12010750
SPECIMEN QUALITY:: ADEQUATE

## 2021-04-28 NOTE — Progress Notes (Signed)
Please call patient: I have reviewed his/her lab results. The urine culture did not show a urinary tract infection.  Have her symptoms resolved?? She will need to return for further evaluation if she is not improving.  thanks

## 2021-05-01 ENCOUNTER — Telehealth: Payer: Self-pay

## 2021-05-01 NOTE — Telephone Encounter (Signed)
error 

## 2021-05-02 ENCOUNTER — Telehealth: Payer: Self-pay | Admitting: Family Medicine

## 2021-05-02 NOTE — Telephone Encounter (Signed)
Copied from [place] [id number]. Topic: Medicare AWV >> May 02, 2021  2:16 PM Harris-Coley, Hannah Beat wrote: Reason for CRM: Left message for patient to schedule Annual Wellness Visit.  Please schedule with Nurse Health Advisor Charlott Rakes, RN at [facility].

## 2021-05-03 ENCOUNTER — Other Ambulatory Visit: Payer: Self-pay | Admitting: Family Medicine

## 2021-05-08 NOTE — Progress Notes (Signed)
Remote pacemaker transmission.   

## 2021-05-16 ENCOUNTER — Telehealth: Payer: Self-pay

## 2021-05-16 NOTE — Telephone Encounter (Signed)
PROLIA:  PA Case: [id number], Status: Approved, Coverage Starts on: 05/08/2021 12:00:00 AM, Coverage Ends on: 11/11/2021 12:00:00 AM.

## 2021-05-16 NOTE — Telephone Encounter (Signed)
PA approved, please schedule patient for nurse visit

## 2021-05-16 NOTE — Telephone Encounter (Signed)
Patient is scheduled   

## 2021-05-16 NOTE — Telephone Encounter (Signed)
Patient is calling in wanting an update on her prolia, she was advised that someone would call her back but hasnt heard anything. Can we schedule patient for prolia shot?

## 2021-05-16 NOTE — Telephone Encounter (Signed)
PA submitted thru CoverMyMeds under medical benefits

## 2021-05-18 ENCOUNTER — Ambulatory Visit: Payer: Medicare PPO

## 2021-05-27 ENCOUNTER — Other Ambulatory Visit: Payer: Self-pay | Admitting: Internal Medicine

## 2021-05-27 NOTE — Telephone Encounter (Signed)
Please refill as per office routine med refill policy (all routine meds refilled for 3 mo or monthly per pt preference up to one year from last visit, then month to month grace period for 3 mo, then further med refills will have to be denied)  

## 2021-06-06 ENCOUNTER — Telehealth: Payer: Self-pay

## 2021-06-06 ENCOUNTER — Ambulatory Visit
Admission: EM | Admit: 2021-06-06 | Discharge: 2021-06-06 | Disposition: A | Discharge status: Home / Self Care | Payer: Medicare PPO | Attending: Family Medicine | Admitting: Family Medicine

## 2021-06-06 DIAGNOSIS — R200 Anesthesia of skin: Secondary | ICD-10-CM

## 2021-06-06 DIAGNOSIS — R208 Other disturbances of skin sensation: Secondary | ICD-10-CM

## 2021-06-06 NOTE — Telephone Encounter (Signed)
FYI. Patient is currently at the ED

## 2021-06-06 NOTE — Telephone Encounter (Signed)
Patient has been seen in ED.  Nurse Assessment Nurse: Raphael Gibney, RN, Vanita Ingles Date/Time (Eastern Time): 06/06/2021 9:05:57 AM Confirm and document reason for call. If symptomatic, describe symptoms. ---Caller states her left foot is numb up to the ankle. has had numbness for 2 weeks. Does the patient have any new or worsening symptoms? ---Yes Will a triage be completed? ---Yes Related visit to physician within the last 2 weeks? ---No Does the PT have any chronic conditions? (i.e. diabetes, asthma, this includes High risk factors for pregnancy, etc.) ---Yes List chronic conditions. ---osteoporosis; pacemaker; HTN Is this a behavioral health or substance abuse call? ---No Guidelines Guideline Title Affirmed Question Affirmed Notes Nurse Date/Time (Eastern Time) Neurologic Deficit [1] Numbness (i.e., loss of sensation) of the face, arm / hand, or leg / foot on one Raphael Gibney, RN, Vanita Ingles 06/06/2021 9:10:27 AM PLEASE NOTE: All timestamps contained within this report are represented as [place] Standard Time. CONFIDENTIALTY NOTICE: This fax transmission is intended only for the addressee. It contains information that is legally privileged, confidential or otherwise protected from use or disclosure. If you are not the intended recipient, you are strictly prohibited from reviewing, disclosing, copying using or disseminating any of this information or taking any action in reliance on or regarding this information. If you have received this fax in error, please notify us immediately by telephone so that we can arrange for its return to [place]. Phone: [phone], Toll-Free: [phone], Fax: [phone] Page: 2 of 2 Call Id: [id number] Guidelines Guideline Title Affirmed Question Affirmed Notes Nurse Date/Time Eilene Ghazi Time) side of the body AND [2] gradual onset (e.g., days to weeks) AND [3] present now Disp. Time Eilene Ghazi Time) Disposition Final User 06/06/2021 8:55:09 AM Attempt made - message  left Raphael Gibney, RN, Vanita Ingles 06/06/2021 9:21:38 AM Go to ED Now (or PCP triage) Yes Raphael Gibney, RN, Vanita Ingles Disposition Overriden: See HCP within 4 Hours (or PCP triage) Override Reason: Patient's symptoms need a higher level of care Caller Disagree/Comply Comply Caller Understands Yes PreDisposition Call Doctor Care Advice Given Per Guideline GO TO ED NOW (OR PCP TRIAGE): * IF NO PCP (PRIMARY CARE PROVIDER) SECOND-LEVEL TRIAGE: You need to be seen within the next hour. Go to the [facility] at _____________ [place] as soon as you can. CARE ADVICE given per Neurologic Deficit (Adult) guideline. Comments User: Dannielle Burn, RN Date/Time Eilene Ghazi Time): 06/06/2021 9:21:05 AM Trriage outcome upgraded to go to ER now (or PCP triage) as pt is having numbness in her left foot. Referrals GO TO FACILITY OTHER - SPECIF

## 2021-06-06 NOTE — Telephone Encounter (Signed)
If has had numbness 2 weeks and not worsening -not sure why this was referred to ER. Perhaps she was having singificant worsening? Will await ER report

## 2021-06-06 NOTE — ED Triage Notes (Signed)
Patient presents to Urgent Care with complaints of numbness to left foot x 1.5 weeks ago. She states she noted toe problem back in Dec 2021 and states PCP evaluated her for this issue with no abnormal findings. She spoke with PCP about numbness and was instructed to come in UC for eval.   Denies tingling sensation.

## 2021-06-07 NOTE — ED Provider Notes (Signed)
Kylie Patrick   [id number] 06/06/21 Arrival Time: 1020  ASSESSMENT & PLAN:  1. Numbness of left foot    Unclear etiology. Discussed nerve related symptoms. May need to see a neurologist. Will discuss with PCP.  Recommend:  Follow-up Information     Schedule an appointment as soon as possible for a visit  with Marin Olp, MD.   Specialty: Family Medicine Contact information: [place] [address] [phone]                Reviewed expectations re: course of current medical issues. Questions answered. Outlined signs and symptoms indicating need for more acute intervention. Patient verbalized understanding. After Visit Summary given.  SUBJECTIVE: History from: patient. Kylie Patrick is a 85 y.o. female who reports fairly persistent "numb feeling" of R foot. Noted a couple of weeks ago; no changes. Ambulatory without difficulty. No injury. No extremity weakness.   Past Surgical History:  Procedure Laterality Date   ABDOMINAL HYSTERECTOMY  1976   BSO for endometriosis and bengin tumor   APPENDECTOMY  1970   BREAST LUMPECTOMY Left 70's   X 2   BUNIONECTOMY Bilateral    CATARACT EXTRACTION W/PHACO Left 05/14/2013   Procedure: CATARACT EXTRACTION PHACO AND INTRAOCULAR LENS PLACEMENT (IOC);  Surgeon: Tonny Branch, MD;  Location: [facility];  Service: Ophthalmology;  Laterality: Left;  CDE:9.76   CATARACT EXTRACTION W/PHACO Right 06/08/2013   Procedure: CATARACT EXTRACTION PHACO AND INTRAOCULAR LENS PLACEMENT (IOC);  Surgeon: Tonny Branch, MD;  Location: [facility];  Service: Ophthalmology;  Laterality: Right;  CDE 13.65   CHOLECYSTECTOMY  1970   COLONOSCOPY W/ POLYPECTOMY  2003   Dr [name]   EP IMPLANTABLE DEVICE N/A 07/18/2016   Procedure: Pacemaker Implant;  Surgeon: Deboraha Sprang, MD;  Location: [place] [facility];  Service: Cardiovascular;  Laterality: N/A;   ESOPHAGEAL MANOMETRY N/A 10/05/2013   Procedure: ESOPHAGEAL MANOMETRY (EM);   Surgeon: Gatha Mayer, MD;  [facility]: [facility];  Service: Endoscopy;  Laterality: N/A;   ESOPHAGOGASTRODUODENOSCOPY  02/05/13   Large Hiatal Hernia   HERNIA REPAIR     LAPAROSCOPIC NISSEN FUNDOPLICATION Bilateral 99991111   Procedure: LAPAROSCOPIC NISSEN FUNDOPLICATION with hiatal hernia repair ;  Surgeon: Edward Jolly, MD;  Location: [facility];  Service: General;  Laterality: Bilateral;      OBJECTIVE:  Vitals:   06/06/21 1100  BP: 104/67  Pulse: 68  Resp: 16  Temp: 98.7 F (37.1 C)  TempSrc: Oral  SpO2: 97%    General appearance: alert; no distress HEENT: [place]; AT Neck: supple with FROM Resp: unlabored respirations Ext: no LE edema; no calf swelling CV: brisk extremity capillary refill of RLE; 2+ DP pulse of RLE. Skin: warm and dry; no visible rashes Neurologic: gait normal; normal strength of RLE; does report decreased sensation to pressure; normal sharp sensation Psychological: alert and cooperative; normal mood and affect   Allergies  Allergen Reactions   Chlorthalidone     01/09/13 creatinine 2.2   Hydrochlorothiazide     Hypokalemia   Losartan Potassium-Hctz     Hypokalemia   Norvasc [Amlodipine Besylate]     Edema   Raloxifene     HTN   Rofecoxib     HTN    Past Medical History:  Diagnosis Date   Anxiety    Colon polyp 123456   Complication of anesthesia    slow to wake up one time   Gastroenteritis    w/ renal insufficiency in  context of protracted nausea & vomitting 2005-02-06   GERD (gastroesophageal reflux disease)    H/O hiatal hernia    Headache(784.0)    occasional lifelong- gabapentin helps a   Hiatal hernia    Hyperlipidemia    Hypertension    w/ LVH on ECHO   Pacemaker 07/2016   Tonsillitis February 06, 2005   Social History   Socioeconomic History   Marital status: Married    Spouse name: Not on file   Number of children: 2   Years of education: Not on file   Highest education level: Not on file  Occupational History   Occupation:  Retired    Fish farm manager: RETIRED  Tobacco Use   Smoking status: Never   Smokeless tobacco: Never  Vaping Use   Vaping Use: Never used  Substance and Sexual Activity   Alcohol use: Yes    Comment: Wine-VERY RARELY   Drug use: No   Sexual activity: Not Currently  Other Topics Concern   Not on file  Social History Narrative   Married 19 years in 02/07/20. Married to 1st husband 21 years but died of massive MI. In February 07, 2020- Derrick at 71 and lost Louie Casa at age 31 to cancer- lung start.    Husband Jimmy patient of Dr. Yong Channel.       Worked for [employer]ologist for 24 years.       Hobbies: has a friend she spends time with 1 day a week and goes out- shopping, eating, or even gambling   Social Determinants of Health   Financial Resource Strain: Not on file  Food Insecurity: Not on file  Transportation Needs: Not on file  Physical Activity: Not on file  Stress: Not on file  Social Connections: Not on file   Family History  Problem Relation Age of Onset   Heart attack Father 69       fatal MI @ 41   Coronary artery disease Mother        CABG; MI @ 63   Diabetes Mother    Stroke Paternal Grandfather        > 53   Stroke Maternal Grandmother        in 52s   Aneurysm Paternal Grandmother        cns ; in 8s   Diabetes Maternal Aunt    Stomach cancer Maternal Aunt    Breast cancer Maternal Aunt    Heart disease Sister    Coronary artery disease Maternal Grandfather    Colon cancer Neg Hx    Esophageal cancer Neg Hx    Past Surgical History:  Procedure Laterality Date   ABDOMINAL HYSTERECTOMY  1976   BSO for endometriosis and bengin tumor   APPENDECTOMY  1970   BREAST LUMPECTOMY Left 70's   X 2   BUNIONECTOMY Bilateral    CATARACT EXTRACTION W/PHACO Left 05/14/2013   Procedure: CATARACT EXTRACTION PHACO AND INTRAOCULAR LENS PLACEMENT ([place]);  Surgeon: Tonny Branch, MD;  Location: [facility];  Service: Ophthalmology;  Laterality: Left;  CDE:9.76   CATARACT EXTRACTION W/PHACO Right  06/08/2013   Procedure: CATARACT EXTRACTION PHACO AND INTRAOCULAR LENS PLACEMENT (IOC);  Surgeon: Tonny Branch, MD;  Location: [facility];  Service: Ophthalmology;  Laterality: Right;  CDE 13.65   CHOLECYSTECTOMY  1970   COLONOSCOPY W/ POLYPECTOMY  Feb 06, 2002   Dr [name]   EP IMPLANTABLE DEVICE N/A 07/18/2016   Procedure: Pacemaker Implant;  Surgeon: Deboraha Sprang, MD;  Location: [place] [facility];  Service: Cardiovascular;  Laterality: N/A;   ESOPHAGEAL MANOMETRY N/A 10/05/2013   Procedure: ESOPHAGEAL MANOMETRY (EM);  Surgeon: Gatha Mayer, MD;  [facility];  Service: Endoscopy;  Laterality: N/A;   ESOPHAGOGASTRODUODENOSCOPY  02/05/13   Large Hiatal Hernia   HERNIA REPAIR     LAPAROSCOPIC NISSEN FUNDOPLICATION Bilateral 99991111   Procedure: LAPAROSCOPIC NISSEN FUNDOPLICATION with hiatal hernia repair ;  Surgeon: Edward Jolly, MD;  [facility];  Service: General;  Laterality: Jilda Panda, MD 06/07/21 1210

## 2021-06-15 ENCOUNTER — Other Ambulatory Visit: Payer: Self-pay | Admitting: Family Medicine

## 2021-06-15 ENCOUNTER — Other Ambulatory Visit: Payer: Self-pay | Admitting: Internal Medicine

## 2021-06-15 DIAGNOSIS — I10 Essential (primary) hypertension: Secondary | ICD-10-CM

## 2021-06-15 NOTE — Telephone Encounter (Signed)
Please refill as per office routine med refill policy (all routine meds refilled for 3 mo or monthly per pt preference up to one year from last visit, then month to month grace period for 3 mo, then further med refills will have to be denied)  

## 2021-06-19 ENCOUNTER — Ambulatory Visit: Payer: Medicare PPO | Admitting: Family Medicine

## 2021-06-19 ENCOUNTER — Encounter: Payer: Self-pay | Admitting: Family Medicine

## 2021-06-19 ENCOUNTER — Other Ambulatory Visit: Payer: Self-pay

## 2021-06-19 VITALS — BP 90/56 | HR 77 | Temp 98.4°F | Ht 59.0 in | Wt 153.8 lb

## 2021-06-19 DIAGNOSIS — I10 Essential (primary) hypertension: Secondary | ICD-10-CM

## 2021-06-19 DIAGNOSIS — E782 Mixed hyperlipidemia: Secondary | ICD-10-CM | POA: Diagnosis not present

## 2021-06-19 DIAGNOSIS — R200 Anesthesia of skin: Secondary | ICD-10-CM

## 2021-06-19 DIAGNOSIS — R202 Paresthesia of skin: Secondary | ICD-10-CM | POA: Diagnosis not present

## 2021-06-19 DIAGNOSIS — F411 Generalized anxiety disorder: Secondary | ICD-10-CM

## 2021-06-19 DIAGNOSIS — N183 Chronic kidney disease, stage 3 unspecified: Secondary | ICD-10-CM

## 2021-06-19 LAB — COMPREHENSIVE METABOLIC PANEL
ALT: 14 U/L (ref 0–35)
AST: 19 U/L (ref 0–37)
Albumin: 4 g/dL (ref 3.5–5.2)
Alkaline Phosphatase: 48 U/L (ref 39–117)
BUN: 23 mg/dL (ref 6–23)
CO2: 32 mEq/L (ref 19–32)
Calcium: 10 mg/dL (ref 8.4–10.5)
Chloride: 102 mEq/L (ref 96–112)
Creatinine, Ser: 1.8 mg/dL — ABNORMAL HIGH (ref 0.40–1.20)
GFR: 25.81 mL/min — ABNORMAL LOW (ref >60.00)
Glucose, Bld: 124 mg/dL — ABNORMAL HIGH (ref 70–99)
Potassium: 3.8 mEq/L (ref 3.5–5.1)
Sodium: 143 mEq/L (ref 135–145)
Total Bilirubin: 0.8 mg/dL (ref 0.2–1.2)
Total Protein: 6.4 g/dL (ref 6.0–8.3)

## 2021-06-19 LAB — LIPID PANEL
Cholesterol: 146 mg/dL (ref 0–200)
HDL: 43.5 mg/dL (ref >39.00)
LDL Cholesterol: 74 mg/dL (ref 0–99)
NonHDL: 102.32
Total CHOL/HDL Ratio: 3
Triglycerides: 141 mg/dL (ref 0.0–149.0)
VLDL: 28.2 mg/dL (ref 0.0–40.0)

## 2021-06-19 LAB — VITAMIN B12: Vitamin B-12: 1259 pg/mL — ABNORMAL HIGH (ref 211–911)

## 2021-06-19 LAB — TSH: TSH: 1.52 u[IU]/mL (ref 0.35–5.50)

## 2021-06-19 MED ORDER — CARVEDILOL 6.25 MG PO TABS: 6.2500 mg | ORAL_TABLET | Freq: Two times a day (BID) | ORAL | 3 refills | Status: DC

## 2021-06-19 MED ORDER — LOSARTAN POTASSIUM 25 MG PO TABS: 25.0000 mg | ORAL_TABLET | Freq: Every day | ORAL | 3 refills | Status: DC

## 2021-06-19 NOTE — Progress Notes (Signed)
Phone [phone] In person visit   Subjective:   Kylie Patrick is a 85 y.o. year old very pleasant female patient who presents for/with See problem oriented charting Chief Complaint  Patient presents with   Numbness    Bilateral foot numbness since about November.      This visit occurred during the SARS-CoV-2 public health emergency.  Safety protocols were in place, including screening questions prior to the visit, additional usage of staff PPE, and extensive cleaning of exam room while observing appropriate contact time as indicated for disinfecting solutions.   Past Medical History-  Patient Active Problem List   Diagnosis Date Noted   Complete heart block ([place]) 04/05/2020    Priority: High   Osteoporosis 04/25/2020    Priority: Medium   Right carpal tunnel syndrome 05/02/2019    Priority: Medium   Diastolic dysfunction 123456    Priority: Medium   Hyperglycemia 10/09/2014    Priority: Medium   GERD (gastroesophageal reflux disease) 10/22/2013    Priority: Medium   CKD (chronic kidney disease) stage 3, GFR 30-59 ml/min ([place]) 05/05/2008    Priority: Medium   Anxiety state 04/20/2008    Priority: Medium   HYPERLIPIDEMIA 08/12/2007    Priority: Medium   Essential hypertension 08/12/2007    Priority: Medium   Rash 07/04/2017    Priority: Low   Hiatal hernia 08/03/2013    Priority: Low   Personal history of colonic polyps 01/09/2013    Priority: Low   NIGHT SWEATS 02/19/2008    Priority: Low   Pacemaker - MDT 08/11/2020   Aortic atherosclerosis ([place]) 04/26/2020    Medications- reviewed and updated Current Outpatient Medications  Medication Sig Dispense Refill   ALPRAZolam (XANAX) 0.25 MG tablet Take 1 tablet (0.25 mg total) by mouth 2 (two) times daily as needed for anxiety. 30 tablet 2   aspirin EC 81 MG tablet Take 81 mg by mouth daily.     Calcium in Bone Mineral Cmplx 350 MG MISC Take 3 each by mouth at bedtime. Takes 1 in the morning and 2 at night      carvedilol (COREG) 6.25 MG tablet Take 1 tablet (6.25 mg total) by mouth 2 (two) times daily with a meal. 180 tablet 3   Cholecalciferol (VITAMIN D3) 2000 UNITS TABS Take 1 tablet by mouth daily with lunch.     esomeprazole (NEXIUM) 40 MG capsule TAKE 1 CAPSULE BY MOUTH TWICE A DAY BEFORE A MEAL 180 capsule 3   ezetimibe-simvastatin (VYTORIN) 10-20 MG tablet TAKE 1 TABLET BY MOUTH EVERYDAY AT BEDTIME 90 tablet 1   famotidine (PEPCID) 40 MG tablet TAKE 1 TABLET BY MOUTH EVERYDAY AT BEDTIME 90 tablet 3   folic acid (FOLVITE) A999333 MCG tablet Take 400 mcg by mouth every morning.     furosemide (LASIX) 40 MG tablet TAKE 1 TABLET BY MOUTH EVERY DAY NEEDS APPT FOR REFILLS 90 tablet 0   gabapentin (NEURONTIN) 100 MG capsule Take 1 capsule (100 mg total) by mouth every 8 (eight) hours as needed (headache). 30 capsule 2   losartan (COZAAR) 25 MG tablet Take 1 tablet (25 mg total) by mouth daily. 90 tablet 3   Magnesium 400 MG CAPS Take 400 mg by mouth at bedtime.     Omega-3 Fatty Acids (FISH OIL) 500 MG CAPS Take 500 mg by mouth daily with lunch.     No current facility-administered medications for this visit.     Objective:  BP (!) 90/56 (BP Location: Right  Arm, Patient Position: Sitting)   Pulse 77   Temp 98.4 F (36.9 C) (Temporal)   Ht '4\' 11"'$  (1.499 m)   Wt 153 lb 12.8 oz (69.8 kg)   SpO2 98%   BMI 31.06 kg/m  Gen: NAD, resting comfortably CV: RRR no murmurs rubs or gallops Lungs: CTAB no crackles, wheeze, rhonchi Ext: trace edema Skin: warm, dry Neuro: gross touch intact. Does not feel monofilament on bottom of either feet except for in sole of foot on right foot. Does feel on top of feet and more pronounced sensation on lower legs. Does not feel coolness on feet like she does on her arms- muc less pronounced     Assessment and Plan   #Bilateral foot numbness S:Patient reports numbness in both of her feet since November 2021 .    At visit in May patient reported low back pain  with pain into both buttocks and was noticing some burning in her lower legs.  We tried a low-dose of prednisone to see if that would be helpful (not ideal with prediabetes and osteoporosis).  Original plan was sports medicine referral if fails to improve within 2 weeks-patient did not reach out but in late July went to the emergency room complaining of numbness in the left foot-was told etiology unclear and was advised to follow-up with PCP.  No labs or imaging was obtained at ER visit  She has had a lot of stress with her husband having bladder cancer and being in nursing home over the last few months. Daughter in law also ill recently with several hospital visits and unclear cause  Today she reports symptoms definitely worse in the left foot- started in toes 1 and 2 and now whole foot up to ankle. Right foot lateral portion of foot only. Prednisone did help buttocks but not foot numbness. Buttocks is much better- sparingly hurts. Back did not improve as much as buttocks.  A/P: 85 year old female with bilateral foot numbness but worse on the left-she did have some improvement in buttocks pain on prednisone-we thought this could be related to nerve irritation in the low back-numbness/tingling in the feet did not improve significantly.  Symptoms going on for at least 9 months at this point-opted to get neurology consult.  Also check B12 and TSH which have not been checked in over a year   #hypertension S: medication: Carvedilol 12.5 mg two times daily , Lasix '40mg'$  daily most days,  losartan '50mg'$  daily (reduced by Dr. Moshe Cipro from '100mg'$ ) Home readings #s:has cuff- not checking. Does feel lightheaded at times.   BP Readings from Last 3 Encounters:  06/19/21 (!) 90/56  06/06/21 104/67  04/26/21 110/80  A/P: Blood pressure is overcontrolled.  Continue Lasix 40 mg for now.  Reduce losartan from 50 mg to 25 mg.  Reduce carvedilol from 12.5 mg twice daily to 6.25 mg twice  daily.  #hyperlipidemia/aortic atherosclerosis -LDL goal at least under 100-ideally under 70 but not increasing medicine for primary prevention S: Medication: fish oil, Vytorin 10-'20mg'$  everyday before bedtime, aspirin '81mg'$  daily - no bleeding history and takes due to strong family history of coronary artery disease. Lab Results  Component Value Date   CHOL 153 04/18/2020   HDL 44.30 04/18/2020   LDLCALC 91 04/18/2020   TRIG 88.0 04/18/2020   CHOLHDL 3 04/18/2020   A/P: Ideally would like for LDL under 70 but given over age 64 we opted to simply target under 100 to reduce side effects of medicine- we will  update lipid panel today- for now continue current meds  # Chronic kidney disease Stage III -followed with Dr. Moshe Cipro S: knows to avoid nsaids.  A/P: hopefully stable with lower BP- going to let BP raise up but check CMP to evaluate   Health Maintenance Due  Topic Date Due   COVID-19 Vaccine (4 - Booster for [employer] series)- recommendd get 4th shot 11/18/2020   INFLUENZA VACCINE - recommended fall flu shot 06/12/2021    Recommended follow up: 3 month follow up advised to keep this Future Appointments  Date Time Provider [place]  07/13/2021  8:40 AM [facility] DEVICE REMOTES [facility]  08/15/2021  1:45 PM Deboraha Sprang, MD [facility]  10/02/2021  9:20 AM Marin Olp, MD [facility]  10/12/2021  8:40 AM [facility] DEVICE REMOTES [facility]  01/11/2022  8:40 AM [facility] DEVICE REMOTES [facility]  04/12/2022  8:40 AM [facility] DEVICE REMOTES [facility]    Lab/Order associations:   ICD-10-CM   1. Numbness and tingling of foot  R20.0 Vitamin B12   R20.2 TSH    Ambulatory referral to Neurology    2. Essential hypertension  I10     3. HYPERLIPIDEMIA  E78.2 Lipid panel    4. Stage 3 chronic kidney disease, unspecified whether stage 3a or 3b CKD (HCC)  N18.30 Comprehensive metabolic  panel    5. Anxiety state  F41.1       Meds ordered this encounter  Medications   losartan (COZAAR) 25 MG tablet    Sig: Take 1 tablet (25 mg total) by mouth daily.    Dispense:  90 tablet    Refill:  3   carvedilol (COREG) 6.25 MG tablet    Sig: Take 1 tablet (6.25 mg total) by mouth 2 (two) times daily with a meal.    Dispense:  180 tablet    Refill:  3    I,Jada Bradford,acting as a scribe for Garret Reddish, MD.,have documented all relevant documentation on the behalf of Garret Reddish, MD,as directed by  Garret Reddish, MD while in the presence of Garret Reddish, MD.  I, Garret Reddish, MD, have reviewed all documentation for this visit. The documentation on 06/19/21 for the exam, diagnosis, procedures, and orders are all accurate and complete.  Return precautions advised.  Garret Reddish, MD

## 2021-06-19 NOTE — Patient Instructions (Addendum)
Health Maintenance Due  Topic Date Due   COVID-19 Vaccine (4 - Booster for [employer] series)   -Please consider getting your COVID-19 vaccination at your local pharmacy. Thanks for doing first 3 shots  11/18/2020   INFLUENZA VACCINE Not available in office YET.   Please consider getting your flu shot in the Fall. If you get this outside of our office, please let us know.  06/12/2021   Please stop by lab before you go If you have mychart- we will send your results within 3 business days of [place] receiving them.  If you do not have mychart- we will call you about results within 5 business days of [place] receiving them.  *please also note that you will see labs on mychart as soon as they post. I will later go in and write notes on them- will say "notes from Dr. Yong Channel"  Reduce Losartan to 25 mg daily and reduce Carvedilol to 6.25 mg twice daily. Update me with blood pressure in 2 weeks after making adjustment   We will call you within two weeks about your referral to neurology. If you do not hear within 2 weeks, give [place] a call.    Recommended follow up: Return in about 3 months (around 09/19/2021) for for follow up or sooner as needed. Keep already scheduled visit

## 2021-06-22 ENCOUNTER — Other Ambulatory Visit: Payer: Self-pay

## 2021-06-22 DIAGNOSIS — R748 Abnormal levels of other serum enzymes: Secondary | ICD-10-CM

## 2021-06-22 DIAGNOSIS — N183 Chronic kidney disease, stage 3 unspecified: Secondary | ICD-10-CM

## 2021-06-28 ENCOUNTER — Other Ambulatory Visit: Payer: Self-pay | Admitting: Family Medicine

## 2021-06-28 ENCOUNTER — Other Ambulatory Visit: Payer: Self-pay

## 2021-06-28 ENCOUNTER — Other Ambulatory Visit (INDEPENDENT_AMBULATORY_CARE_PROVIDER_SITE_OTHER): Payer: Medicare PPO

## 2021-06-28 DIAGNOSIS — R748 Abnormal levels of other serum enzymes: Secondary | ICD-10-CM

## 2021-06-28 DIAGNOSIS — N183 Chronic kidney disease, stage 3 unspecified: Secondary | ICD-10-CM | POA: Diagnosis not present

## 2021-06-28 LAB — BASIC METABOLIC PANEL
BUN: 20 mg/dL (ref 6–23)
CO2: 31 mEq/L (ref 19–32)
Calcium: 9.5 mg/dL (ref 8.4–10.5)
Chloride: 102 mEq/L (ref 96–112)
Creatinine, Ser: 1.34 mg/dL — ABNORMAL HIGH (ref 0.40–1.20)
GFR: 36.77 mL/min — ABNORMAL LOW (ref >60.00)
Glucose, Bld: 63 mg/dL — ABNORMAL LOW (ref 70–99)
Potassium: 3.1 mEq/L — ABNORMAL LOW (ref 3.5–5.1)
Sodium: 143 mEq/L (ref 135–145)

## 2021-06-28 MED ORDER — POTASSIUM CHLORIDE CRYS ER 20 MEQ PO TBCR: 20.0000 meq | EXTENDED_RELEASE_TABLET | Freq: Every day | ORAL | 0 refills | Status: DC | PRN

## 2021-06-30 ENCOUNTER — Other Ambulatory Visit: Payer: Self-pay

## 2021-06-30 ENCOUNTER — Other Ambulatory Visit: Payer: Self-pay | Admitting: Family Medicine

## 2021-07-13 ENCOUNTER — Ambulatory Visit (INDEPENDENT_AMBULATORY_CARE_PROVIDER_SITE_OTHER): Payer: Medicare PPO

## 2021-07-13 DIAGNOSIS — I442 Atrioventricular block, complete: Secondary | ICD-10-CM

## 2021-07-18 LAB — CUP PACEART REMOTE DEVICE CHECK
Battery Remaining Longevity: 28 mo
Battery Voltage: 2.96 V
Brady Statistic AP VP Percent: 23.11 %
Brady Statistic AP VS Percent: 0 %
Brady Statistic AS VP Percent: 76.83 %
Brady Statistic AS VS Percent: 0.06 %
Brady Statistic RA Percent Paced: 23.08 %
Brady Statistic RV Percent Paced: 99.86 %
Date Time Interrogation Session: 20220902105601
Implantable Lead Implant Date: 20170906
Implantable Lead Implant Date: 20170906
Implantable Lead Location: 753859
Implantable Lead Location: 753860
Implantable Lead Model: 3830
Implantable Lead Model: 5076
Implantable Pulse Generator Implant Date: 20170906
Lead Channel Impedance Value: 304 Ohm
Lead Channel Impedance Value: 380 Ohm
Lead Channel Impedance Value: 456 Ohm
Lead Channel Impedance Value: 532 Ohm
Lead Channel Pacing Threshold Amplitude: 0.625 V
Lead Channel Pacing Threshold Amplitude: 2.125 V
Lead Channel Pacing Threshold Pulse Width: 0.4 ms
Lead Channel Pacing Threshold Pulse Width: 0.4 ms
Lead Channel Sensing Intrinsic Amplitude: 2.125 mV
Lead Channel Sensing Intrinsic Amplitude: 2.125 mV
Lead Channel Sensing Intrinsic Amplitude: 5 mV
Lead Channel Sensing Intrinsic Amplitude: 5 mV
Lead Channel Setting Pacing Amplitude: 2 V
Lead Channel Setting Pacing Amplitude: 2.5 V
Lead Channel Setting Pacing Pulse Width: 1 ms
Lead Channel Setting Sensing Sensitivity: 0.9 mV

## 2021-07-21 ENCOUNTER — Other Ambulatory Visit: Payer: Self-pay | Admitting: Family Medicine

## 2021-07-25 NOTE — Progress Notes (Signed)
Remote pacemaker transmission.   

## 2021-08-04 ENCOUNTER — Other Ambulatory Visit: Payer: Self-pay | Admitting: Family Medicine

## 2021-08-10 ENCOUNTER — Ambulatory Visit: Payer: Medicare PPO | Admitting: Neurology

## 2021-08-10 ENCOUNTER — Encounter: Payer: Self-pay | Admitting: Neurology

## 2021-08-10 VITALS — BP 120/74 | HR 75 | Ht 59.0 in | Wt 156.5 lb

## 2021-08-10 DIAGNOSIS — M792 Neuralgia and neuritis, unspecified: Secondary | ICD-10-CM

## 2021-08-10 MED ORDER — GABAPENTIN 100 MG PO CAPS: 100.0000 mg | ORAL_CAPSULE | Freq: Three times a day (TID) | ORAL | 2 refills | Status: DC | PRN

## 2021-08-10 NOTE — Patient Instructions (Signed)
Start with Gabapentin 100 mg nightly, can take an extra dose in the daytime if pain still persist  Continue with your other medications  Follow up in 6 months or sooner if worse

## 2021-08-10 NOTE — Progress Notes (Signed)
[facility]  PATIENT: Kylie Patrick DOB: Oct 21, 1936  REFERRING CLINICIAN: Marin Olp, MD HISTORY FROM: Patient  REASON FOR VISIT: Bilateral feet numbness    HISTORICAL  CHIEF COMPLAINT:  Chief Complaint  Patient presents with   Foot Pain    New patient: internal referral:  having bilateral foot pain/ankle pain Room 13, alone in room    HISTORY OF PRESENT ILLNESS:  This is a 85 year old woman with past medical history of heart disease with pacemaker placement, hypertension, GERD who is presenting with complaint of bilateral feet numbness.  Patient stated the numbness started since last November.  It started initially with the left foot.  She has seen her primary care doctor and at that time she was not started on any medications.  Patient stated that the numbness continued to worsen now involving the entire left foot and is now on the right foot, on top and lateral part of the right foot.  She also complains of right ankle pain.  She said walking barefoot make her feel like she is walking on sponges and that she has new shoes with a good sole and walking is much better now.  She denies any recent falls.  List of medication include gabapentin but she mentioned she only takes it sporadically for headaches.  She has not tried for feet numbness and pain. She does not have a history of diabetes.    OTHER MEDICAL CONDITIONS: CAD with pacemaker, HTN, GERD,    REVIEW OF SYSTEMS: Full 14 system review of systems performed and negative with exception of: as noted in the HPI.   ALLERGIES: Allergies  Allergen Reactions   Chlorthalidone     01/09/13 creatinine 2.2   Hydrochlorothiazide     Hypokalemia   Losartan Potassium-Hctz     Hypokalemia   Norvasc [Amlodipine Besylate]     Edema   Raloxifene     HTN   Rofecoxib     HTN    HOME MEDICATIONS: Outpatient Medications Prior to Visit  Medication Sig Dispense Refill   ALPRAZolam (XANAX) 0.25 MG tablet  Take 1 tablet (0.25 mg total) by mouth 2 (two) times daily as needed for anxiety. 30 tablet 2   aspirin EC 81 MG tablet Take 81 mg by mouth daily.     Calcium in Bone Mineral Cmplx 350 MG MISC Take 3 each by mouth at bedtime. Takes 1 in the morning and 2 at night     carvedilol (COREG) 6.25 MG tablet Take 1 tablet (6.25 mg total) by mouth 2 (two) times daily with a meal. 180 tablet 3   Cholecalciferol (VITAMIN D3) 2000 UNITS TABS Take 1 tablet by mouth daily with lunch.     esomeprazole (NEXIUM) 40 MG capsule TAKE 1 CAPSULE BY MOUTH TWICE A DAY BEFORE A MEAL 180 capsule 3   ezetimibe-simvastatin (VYTORIN) 10-20 MG tablet TAKE 1 TABLET BY MOUTH EVERYDAY AT BEDTIME 90 tablet 2   famotidine (PEPCID) 40 MG tablet TAKE 1 TABLET BY MOUTH EVERYDAY AT BEDTIME 90 tablet 3   folic acid (FOLVITE) 378 MCG tablet Take 400 mcg by mouth every morning.     furosemide (LASIX) 40 MG tablet TAKE 1 TABLET BY MOUTH EVERY DAY NEEDS APPT FOR REFILLS 90 tablet 0   KLOR-CON M20 20 MEQ tablet TAKE 1 TABLET (20 MEQ TOTAL) BY MOUTH DAILY AS NEEDED. 90 tablet 1   losartan (COZAAR) 25 MG tablet Take 1 tablet (25 mg total) by mouth daily. 90 tablet 3  Magnesium 400 MG CAPS Take 400 mg by mouth at bedtime.     Omega-3 Fatty Acids (FISH OIL) 500 MG CAPS Take 500 mg by mouth daily with lunch.     gabapentin (NEURONTIN) 100 MG capsule Take 1 capsule (100 mg total) by mouth every 8 (eight) hours as needed (headache). 30 capsule 2   No facility-administered medications prior to visit.    PAST MEDICAL HISTORY: Past Medical History:  Diagnosis Date   Anxiety    Colon polyp 1610   Complication of anesthesia    slow to wake up one time   Gastroenteritis    w/ renal insufficiency in context of protracted nausea & vomitting 2006   GERD (gastroesophageal reflux disease)    H/O hiatal hernia    Headache(784.0)    occasional lifelong- gabapentin helps a   Hiatal hernia    Hyperlipidemia    Hypertension    w/ LVH on ECHO    Pacemaker 07/2016   Tonsillitis 2006    PAST SURGICAL HISTORY: Past Surgical History:  Procedure Laterality Date   ABDOMINAL HYSTERECTOMY  1976   BSO for endometriosis and bengin tumor   APPENDECTOMY  1970   BREAST LUMPECTOMY Left 70's   X 2   BUNIONECTOMY Bilateral    CATARACT EXTRACTION W/PHACO Left 05/14/2013   Procedure: CATARACT EXTRACTION PHACO AND INTRAOCULAR LENS PLACEMENT ([place]);  Surgeon: Tonny Branch, MD;  Location: [facility];  Service: Ophthalmology;  Laterality: Left;  CDE:9.76   CATARACT EXTRACTION W/PHACO Right 06/08/2013   Procedure: CATARACT EXTRACTION PHACO AND INTRAOCULAR LENS PLACEMENT ([facility]);  Surgeon: Tonny Branch, MD;  Location: [facility];  Service: Ophthalmology;  Laterality: Right;  CDE 13.65   CHOLECYSTECTOMY  1970   COLONOSCOPY W/ POLYPECTOMY  2003   Dr [name]   EP IMPLANTABLE DEVICE N/A 07/18/2016   Procedure: Pacemaker Implant;  Surgeon: Deboraha Sprang, MD;  Location: [facility];  Service: Cardiovascular;  Laterality: N/A;   ESOPHAGEAL MANOMETRY N/A 10/05/2013   Procedure: ESOPHAGEAL MANOMETRY (EM);  Surgeon: Gatha Mayer, MD;  Location: [facility];  Service: Endoscopy;  Laterality: N/A;   ESOPHAGOGASTRODUODENOSCOPY  02/05/13   Large Hiatal Hernia   HERNIA REPAIR     LAPAROSCOPIC NISSEN FUNDOPLICATION Bilateral 96/02/5408   Procedure: LAPAROSCOPIC NISSEN FUNDOPLICATION with hiatal hernia repair ;  Surgeon: Edward Jolly, MD;  Location: [facility];  Service: General;  Laterality: Bilateral;    FAMILY HISTORY: Family History  Problem Relation Age of Onset   Heart attack Father 80       fatal MI @ 71   Coronary artery disease Mother        CABG; MI @ 17   Diabetes Mother    Stroke Paternal Grandfather        > 59   Stroke Maternal Grandmother        in 83s   Aneurysm Paternal Grandmother        cns ; in 49s   Diabetes Maternal Aunt    Stomach cancer Maternal Aunt    Breast cancer Maternal Aunt    Heart disease Sister    Coronary artery disease  Maternal Grandfather    Colon cancer Neg Hx    Esophageal cancer Neg Hx     SOCIAL HISTORY: Social History   Socioeconomic History   Marital status: Married    Spouse name: Not on file   Number of children: 2   Years of education: Not on file   Highest education level: Not  on file  Occupational History   Occupation: Retired    Fish farm manager: RETIRED  Tobacco Use   Smoking status: Never   Smokeless tobacco: Never  Vaping Use   Vaping Use: Never used  Substance and Sexual Activity   Alcohol use: Yes    Comment: Wine-VERY RARELY   Drug use: No   Sexual activity: Not Currently  Other Topics Concern   Not on file  Social History Narrative   Lives alone, husband is in a nursing home   Right Handed   Drinks caffeine rarely   Social Determinants of Health   Financial Resource Strain: Not on file  Food Insecurity: Not on file  Transportation Needs: Not on file  Physical Activity: Not on file  Stress: Not on file  Social Connections: Not on file  Intimate Partner Violence: Not on file     PHYSICAL EXAM  GENERAL EXAM/CONSTITUTIONAL: Vitals:  Vitals:   08/10/21 1344  BP: 120/74  Pulse: 75  Weight: 156 lb 8 oz (71 kg)  Height: 4\' 11"  (1.499 m)   Body mass index is 31.61 kg/m. Wt Readings from Last 3 Encounters:  08/10/21 156 lb 8 oz (71 kg)  06/19/21 153 lb 12.8 oz (69.8 kg)  04/26/21 157 lb 6.4 oz (71.4 kg)   Patient is in no distress; well developed, nourished and groomed; neck is supple  EYES: Pupils round and reactive to light, Visual fields full to confrontation, Extraocular movements intacts,   MUSCULOSKELETAL: Gait, strength, tone, movements noted in Neurologic exam below  NEUROLOGIC: MENTAL STATUS:  No flowsheet data found. awake, alert, oriented to person, place and time recent and remote memory intact normal attention and concentration language fluent, comprehension intact, naming intact fund of knowledge appropriate  CRANIAL NERVE:  2nd, 3rd,  4th, 6th - pupils equal and reactive to light, visual fields full to confrontation, extraocular muscles intact, no nystagmus 5th - facial sensation symmetric 7th - facial strength symmetric 8th - hearing intact 9th - palate elevates symmetrically, uvula midline 11th - shoulder shrug symmetric 12th - tongue protrusion midline  MOTOR:  normal bulk and tone, full strength in the BUE, BLE  SENSORY:  Decrease sensation to pinprick and vibration in the BLE up to knees. BUE is normal and symmetric to light touch, pinprick, temperature, vibration  COORDINATION:  finger-nose-finger  REFLEXES:  1+ knees and absent ankle reflexes.   GAIT/STATION:  normal     DIAGNOSTIC DATA (LABS, IMAGING, TESTING) - I reviewed patient records, labs, notes, testing and imaging myself where available.  Lab Results  Component Value Date   WBC 6.3 03/31/2021   HGB 12.4 03/31/2021   HCT 36.9 03/31/2021   MCV 88.8 03/31/2021   PLT 210.0 03/31/2021      Component Value Date/Time   NA 143 06/28/2021 0940   NA 142 02/10/2021 0000   K 3.1 (L) 06/28/2021 0940   CL 102 06/28/2021 0940   CO2 31 06/28/2021 0940   GLUCOSE 63 (L) 06/28/2021 0940   BUN 20 06/28/2021 0940   BUN 25 (A) 02/10/2021 0000   CREATININE 1.34 (H) 06/28/2021 0940   CREATININE 1.67 (H) 10/10/2020 1429   CALCIUM 9.5 06/28/2021 0940   PROT 6.4 06/19/2021 1422   ALBUMIN 4.0 06/19/2021 1422   AST 19 06/19/2021 1422   ALT 14 06/19/2021 1422   ALKPHOS 48 06/19/2021 1422   BILITOT 0.8 06/19/2021 1422   GFRNONAA 28 (L) 10/10/2020 1429   GFRAA 33 (L) 10/10/2020 1429   Lab Results  Component Value Date   CHOL 146 06/19/2021   HDL 43.50 06/19/2021   LDLCALC 74 06/19/2021   TRIG 141.0 06/19/2021   CHOLHDL 3 06/19/2021   Lab Results  Component Value Date   HGBA1C 5.8 03/31/2021   Lab Results  Component Value Date   VITAMINB12 1,259 (H) 06/19/2021   Lab Results  Component Value Date   TSH 1.52 06/19/2021     ASSESSMENT  AND PLAN  85 y.o. year old female with with past medical history of heart disease, hypertension, hyperlipidemia, who is presenting with bilateral feet numbness for the past year.  Numbness is mostly in both feet left greater than right.  On exam she does have decreased sensation to light touch, pinprick and vibration in the bilateral lower extremities up to the knees when compared to the upper extremities.  She has not tried any medications for the numbness.  Patient likely has neuropathic pain, however she does not have diabetes.  I will start her on gabapentin 100 mg nightly and she can take extra dose during the daytime as tolerated.  I advised the patient if the 100 mg is not enough we can increase the nighttime dose as tolerated.  I will see the patient in 6 months for follow-up.  Her vitamin B12 was checked recently and it was 1259 and her TSH was normal at 1.52.    1. Neuropathic pain     PLAN:  Start with Gabapentin 100 mg nightly, can take an extra dose in the daytime if pain still persist  Continue with your other medications  Follow up in 6 months or sooner if worse   No orders of the defined types were placed in this encounter.   Meds ordered this encounter  Medications   gabapentin (NEURONTIN) 100 MG capsule    Sig: Take 1 capsule (100 mg total) by mouth every 8 (eight) hours as needed (headache).    Dispense:  90 capsule    Refill:  2    Return in about 6 months (around 02/07/2022).    Alric Ran, MD 08/10/2021, 2:43 PM  [facility] [address] [place], [place] [address] [phone]

## 2021-08-15 ENCOUNTER — Other Ambulatory Visit: Payer: Self-pay

## 2021-08-15 ENCOUNTER — Ambulatory Visit: Payer: Medicare PPO | Admitting: Internal Medicine

## 2021-08-15 ENCOUNTER — Encounter: Payer: Self-pay | Admitting: Internal Medicine

## 2021-08-15 VITALS — BP 110/58 | HR 76 | Ht 59.0 in | Wt 156.6 lb

## 2021-08-15 DIAGNOSIS — I442 Atrioventricular block, complete: Secondary | ICD-10-CM | POA: Diagnosis not present

## 2021-08-15 DIAGNOSIS — Z950 Presence of cardiac pacemaker: Secondary | ICD-10-CM

## 2021-08-15 NOTE — Progress Notes (Addendum)
Patient Care Team: Marin Olp, MD as PCP - General (Family Medicine)   HPI  Kylie Patrick is a 85 y.o. female Seen in followup for His Bundle pacemaker implanted 9/17 for symptomatic 2:1 block now with CHB She had had syncope and then weakness prior to pacing  12/16 Echo  EF 55-60%   ;    Date Cr K Hgb  6/20 1.29 3.5  12.8  9/21  1.93 3.7 11.7  8/22 1.34 3.1 12.1 (5/22)   The patient denies chest pain, nocturnal dyspnea, orthopnea or peripheral edema.  There have been no palpitations, lightheadedness or syncop.  Complains of just inadvertent amount of stress.  Her daughter-in-law developed Sharlyn Bologna, her husband was diagnosed with a malignant bladder tumor that was subsequently complicated by strokes prompting his transfer now to a nursing facility for which she does not anticipate him coming home..    Past Medical History:  Diagnosis Date   Anxiety    Colon polyp 2536   Complication of anesthesia    slow to wake up one time   Gastroenteritis    w/ renal insufficiency in context of protracted nausea & vomitting 2006   GERD (gastroesophageal reflux disease)    H/O hiatal hernia    Headache(784.0)    occasional lifelong- gabapentin helps a   Hiatal hernia    Hyperlipidemia    Hypertension    w/ LVH on ECHO   Pacemaker 07/2016   Tonsillitis 2006    Past Surgical History:  Procedure Laterality Date   ABDOMINAL HYSTERECTOMY  1976   BSO for endometriosis and bengin tumor   APPENDECTOMY  1970   BREAST LUMPECTOMY Left 70's   X 2   BUNIONECTOMY Bilateral    CATARACT EXTRACTION W/PHACO Left 05/14/2013   Procedure: CATARACT EXTRACTION PHACO AND INTRAOCULAR LENS PLACEMENT ([place]);  Surgeon: Tonny Branch, MD;  Location: [facility];  Service: Ophthalmology;  Laterality: Left;  CDE:9.76   CATARACT EXTRACTION W/PHACO Right 06/08/2013   Procedure: CATARACT EXTRACTION PHACO AND INTRAOCULAR LENS PLACEMENT ([facility]);  Surgeon: Tonny Branch, MD;  Location: [facility];  Service:  Ophthalmology;  Laterality: Right;  CDE 13.65   CHOLECYSTECTOMY  1970   COLONOSCOPY W/ POLYPECTOMY  2003   Dr [name]   EP IMPLANTABLE DEVICE N/A 07/18/2016   Procedure: Pacemaker Implant;  Surgeon: Deboraha Sprang, MD;  Location: [facility];  Service: Cardiovascular;  Laterality: N/A;   ESOPHAGEAL MANOMETRY N/A 10/05/2013   Procedure: ESOPHAGEAL MANOMETRY (EM);  Surgeon: Gatha Mayer, MD;  [facility];  Service: Endoscopy;  Laterality: N/A;   ESOPHAGOGASTRODUODENOSCOPY  02/05/13   Large Hiatal Hernia   HERNIA REPAIR     LAPAROSCOPIC NISSEN FUNDOPLICATION Bilateral 64/40/3474   Procedure: LAPAROSCOPIC NISSEN FUNDOPLICATION with hiatal hernia repair ;  Surgeon: Edward Jolly, MD;  Location: [facility];  Service: General;  Laterality: Bilateral;    Current Outpatient Medications  Medication Sig Dispense Refill   ALPRAZolam (XANAX) 0.25 MG tablet Take 1 tablet (0.25 mg total) by mouth 2 (two) times daily as needed for anxiety. 30 tablet 2   aspirin EC 81 MG tablet Take 81 mg by mouth daily.     Calcium in Bone Mineral Cmplx 350 MG MISC Take 3 each by mouth at bedtime. Takes 1 in the morning and 2 at night     carvedilol (COREG) 6.25 MG tablet Take 1 tablet (6.25 mg total) by mouth 2 (two) times daily with a meal. 180 tablet  3   Cholecalciferol (VITAMIN D3) 2000 UNITS TABS Take 1 tablet by mouth daily with lunch.     esomeprazole (NEXIUM) 40 MG capsule TAKE 1 CAPSULE BY MOUTH TWICE A DAY BEFORE A MEAL 180 capsule 3   ezetimibe-simvastatin (VYTORIN) 10-20 MG tablet TAKE 1 TABLET BY MOUTH EVERYDAY AT BEDTIME 90 tablet 2   famotidine (PEPCID) 40 MG tablet TAKE 1 TABLET BY MOUTH EVERYDAY AT BEDTIME 90 tablet 3   folic acid (FOLVITE) 128 MCG tablet Take 400 mcg by mouth every morning.     furosemide (LASIX) 40 MG tablet TAKE 1 TABLET BY MOUTH EVERY DAY NEEDS APPT FOR REFILLS 90 tablet 0   gabapentin (NEURONTIN) 100 MG capsule Take 1 capsule (100 mg total) by mouth every 8 (eight)  hours as needed (headache). 90 capsule 2   KLOR-CON M20 20 MEQ tablet TAKE 1 TABLET (20 MEQ TOTAL) BY MOUTH DAILY AS NEEDED. 90 tablet 1   losartan (COZAAR) 25 MG tablet Take 1 tablet (25 mg total) by mouth daily. 90 tablet 3   Magnesium 400 MG CAPS Take 400 mg by mouth at bedtime.     Omega-3 Fatty Acids (FISH OIL) 500 MG CAPS Take 500 mg by mouth daily with lunch.     No current facility-administered medications for this visit.    Allergies  Allergen Reactions   Chlorthalidone     01/09/13 creatinine 2.2   Hydrochlorothiazide     Hypokalemia   Losartan Potassium-Hctz     Hypokalemia   Norvasc [Amlodipine Besylate]     Edema   Raloxifene     HTN   Rofecoxib     HTN      Review of Systems negative except from HPI and PMH  Physical Exam BP (!) 110/58   Pulse 76   Ht 4\' 11"  (1.499 m)   Wt 156 lb 9.6 oz (71 kg)   SpO2 98%   BMI 31.63 kg/m  Well developed and .obese  in no acute distress HENT normal Neck supple with JVP-flat Clear Device pocket well healed; without hematoma or erythema.  There is no tethering  Regular rate and rhythm, no  gallop No  murmur Abd-soft with active BS No Clubbing cyanosis  edema Skin-warm and dry A & Oriented  Grossly normal sensory and motor function  ECG P-synchronous/ AV  pacing   Assessment and  Plan  2:1 AVB>> Complete heart block   Pacer- His Medtronic   Syncope  Hypertension  Exercise intolerance  SCAF  hypokalemia  Stresses been overwhelming.    Trying to get some walking in.  She is euvolemic.  We will continue her on Lasix 40 mg daily.  Had hypotension and her PCP appropriately decrease her carvedilol now down to 6.25 twice daily and carvedilol 25 daily.  Blood pressure is now well controlled.  Device function is normal  Last measure potassium was 3.1.  We will need to recheck.    Current medicines are reviewed at length with the patient today .  The patient does not  have concerns regarding medicines.

## 2021-08-15 NOTE — Patient Instructions (Signed)
Medication Instructions:  Your physician recommends that you continue on your current medications as directed. Please refer to the Current Medication list given to you today.  *If you need a refill on your cardiac medications before your next appointment, please call your pharmacy*   Lab Work: None ordered.  If you have labs (blood work) drawn today and your tests are completely normal, you will receive your results only by: [place] (if you have MyChart) OR A paper copy in the mail If you have any lab test that is abnormal or we need to change your treatment, we will call you to review the results.   Testing/Procedures: None ordered.    Follow-Up: At [facility], you and your health needs are our priority.  As part of our continuing mission to provide you with exceptional heart care, we have created designated Provider Care Teams.  These Care Teams include your primary Cardiologist (physician) and Advanced Practice Providers (APPs -  Physician Assistants and Nurse Practitioners) who all work together to provide you with the care you need, when you need it.  We recommend signing up for the patient portal called "MyChart".  Sign up information is provided on this After Visit Summary.  MyChart is used to connect with patients for Virtual Visits (Telemedicine).  Patients are able to view lab/test results, encounter notes, upcoming appointments, etc.  Non-urgent messages can be sent to your provider as well.   To learn more about what you can do with MyChart, go to [email or web address].    Your next appointment:   Follow up with Dr Caryl Comes in 12 months

## 2021-08-30 NOTE — Progress Notes (Incomplete)
Phone [phone] In person visit   Subjective:   Kylie Patrick is a 85 y.o. year old very pleasant female patient who presents for/with See problem oriented charting No chief complaint on file.   This visit occurred during the SARS-CoV-2 public health emergency.  Safety protocols were in place, including screening questions prior to the visit, additional usage of staff PPE, and extensive cleaning of exam room while observing appropriate contact time as indicated for disinfecting solutions.   Past Medical History-  Patient Active Problem List   Diagnosis Date Noted   Pacemaker - MDT 08/11/2020   Aortic atherosclerosis ([place]) 04/26/2020   Osteoporosis 04/25/2020   Complete heart block ([place]) 04/05/2020   Right carpal tunnel syndrome 05/02/2019   Rash 85/46/2703   Diastolic dysfunction 50/07/3817   Hyperglycemia 10/09/2014   GERD (gastroesophageal reflux disease) 10/22/2013   Hiatal hernia 08/03/2013   Personal history of colonic polyps 01/09/2013   CKD (chronic kidney disease) stage 3, GFR 30-59 ml/min (HCC) 05/05/2008   Anxiety state 04/20/2008   NIGHT SWEATS 02/19/2008   HYPERLIPIDEMIA 08/12/2007   Essential hypertension 08/12/2007    Medications- reviewed and updated Current Outpatient Medications  Medication Sig Dispense Refill   ALPRAZolam (XANAX) 0.25 MG tablet Take 1 tablet (0.25 mg total) by mouth 2 (two) times daily as needed for anxiety. 30 tablet 2   aspirin EC 81 MG tablet Take 81 mg by mouth daily.     Calcium in Bone Mineral Cmplx 350 MG MISC Take 3 each by mouth at bedtime. Takes 1 in the morning and 2 at night     carvedilol (COREG) 6.25 MG tablet Take 1 tablet (6.25 mg total) by mouth 2 (two) times daily with a meal. 180 tablet 3   Cholecalciferol (VITAMIN D3) 2000 UNITS TABS Take 1 tablet by mouth daily with lunch.     esomeprazole (NEXIUM) 40 MG capsule TAKE 1 CAPSULE BY MOUTH TWICE A DAY BEFORE A MEAL 180 capsule 3   ezetimibe-simvastatin (VYTORIN)  10-20 MG tablet TAKE 1 TABLET BY MOUTH EVERYDAY AT BEDTIME 90 tablet 2   famotidine (PEPCID) 40 MG tablet TAKE 1 TABLET BY MOUTH EVERYDAY AT BEDTIME 90 tablet 3   folic acid (FOLVITE) 299 MCG tablet Take 400 mcg by mouth every morning.     furosemide (LASIX) 40 MG tablet TAKE 1 TABLET BY MOUTH EVERY DAY NEEDS APPT FOR REFILLS 90 tablet 0   gabapentin (NEURONTIN) 100 MG capsule Take 1 capsule (100 mg total) by mouth every 8 (eight) hours as needed (headache). 90 capsule 2   KLOR-CON M20 20 MEQ tablet TAKE 1 TABLET (20 MEQ TOTAL) BY MOUTH DAILY AS NEEDED. 90 tablet 1   losartan (COZAAR) 25 MG tablet Take 1 tablet (25 mg total) by mouth daily. 90 tablet 3   Magnesium 400 MG CAPS Take 400 mg by mouth at bedtime.     Omega-3 Fatty Acids (FISH OIL) 500 MG CAPS Take 500 mg by mouth daily with lunch.     No current facility-administered medications for this visit.     Objective:  There were no vitals taken for this visit. Gen: NAD, resting comfortably CV: RRR no murmurs rubs or gallops Lungs: CTAB no crackles, wheeze, rhonchi Abdomen: soft/nontender/nondistended/normal bowel sounds. No rebound or guarding.  Ext: no edema Skin: warm, dry Neuro: grossly normal, moves all extremities  ***    Assessment and Plan   # Hand pain S:***  A/P: ***   #Bilateral foot numbness S:Patient reports numbness  in both of her feet since November 2021 .     At visit in May patient reported low back pain with pain into both buttocks and was noticing some burning in her lower legs.  We tried a low-dose of prednisone to see if that would be helpful (not ideal with prediabetes and osteoporosis).  Original plan was sports medicine referral if failed to improve within 2 weeks-patient did not reach out but in late July went to the emergency room complained of numbness in the left foot-was told etiology unclear and was advised to follow-up with PCP.  No labs or imaging was obtained at ER visit   She had a lot of  stress with her husband having bladder cancer and been in nursing home over the last few months. Daughter in law also ill with several hospital visits and unclear cause   -She also reported symptoms definitely worsened in the left foot- started in toes 1 and 2 and now whole foot up to ankle. Right foot lateral portion of foot only. Prednisone did help buttocks but not foot numbness. Buttocks was much better- sparingly hurt. Back did not improve as much as buttocks.  A/P: ***     #hypertension S: medication: Carvedilol 12.5 mg two times daily , Lasix 40mg  daily most days,  losartan 50mg  daily (reduced by Dr. Moshe Cipro from 100mg ) Home readings #s: *** BP Readings from Last 3 Encounters:  08/15/21 (!) 110/58  08/10/21 120/74  06/19/21 (!) 90/56  A/P: ***  #hyperlipidemia/aortic atherosclerosis -LDL goal at least under 100-ideally under 70 but not increased medicine for primary prevention S: Medication: fish oil, Vytorin 10-20mg  everyday before bedtime, aspirin 81mg  daily - no bleeding history and takes due to strong family history of coronary artery disease. Lab Results  Component Value Date   CHOL 146 06/19/2021   HDL 43.50 06/19/2021   LDLCALC 74 06/19/2021   TRIG 141.0 06/19/2021   CHOLHDL 3 06/19/2021   A/P: ***  # Chronic kidney disease Stage III -followed with Dr. Shelva Majestic: Knew to avoid nsaids.  A/P: ***  Health Maintenance Due  Topic Date Due   COVID-19 Vaccine (4 - Booster for [employer] series) 11/10/2020   Recommended follow up: No follow-ups on file. Future Appointments  Date Time Provider [place]  09/06/2021 11:20 AM Marin Olp, MD [facility]  10/02/2021  9:20 AM Marin Olp, MD [facility]  10/12/2021  8:40 AM [facility] DEVICE REMOTES [facility]  01/11/2022  8:40 AM [facility] DEVICE REMOTES [facility]  02/08/2022  1:45 PM Alric Ran, MD [facility] None  04/12/2022  8:40 AM [facility] DEVICE REMOTES  [facility]    Lab/Order associations: No diagnosis found.  No orders of the defined types were placed in this encounter.   I,Jada Bradford,acting as a scribe for Garret Reddish, MD.,have documented all relevant documentation on the behalf of Garret Reddish, MD,as directed by  Garret Reddish, MD while in the presence of Garret Reddish, MD.  ***  Return precautions advised.  Burnett Corrente

## 2021-08-31 ENCOUNTER — Ambulatory Visit: Payer: Medicare PPO | Admitting: Family Medicine

## 2021-08-31 ENCOUNTER — Other Ambulatory Visit: Payer: Self-pay

## 2021-08-31 ENCOUNTER — Encounter: Payer: Self-pay | Admitting: Family Medicine

## 2021-08-31 VITALS — BP 135/81 | HR 66 | Temp 98.0°F | Ht 59.0 in | Wt 158.6 lb

## 2021-08-31 DIAGNOSIS — I10 Essential (primary) hypertension: Secondary | ICD-10-CM

## 2021-08-31 DIAGNOSIS — E782 Mixed hyperlipidemia: Secondary | ICD-10-CM | POA: Diagnosis not present

## 2021-08-31 DIAGNOSIS — M79645 Pain in left finger(s): Secondary | ICD-10-CM | POA: Diagnosis not present

## 2021-08-31 DIAGNOSIS — M792 Neuralgia and neuritis, unspecified: Secondary | ICD-10-CM | POA: Diagnosis not present

## 2021-08-31 NOTE — Progress Notes (Signed)
Phone [phone] In person visit   Subjective:   Kylie Patrick is a 85 y.o. year old very pleasant female patient who presents for/with See problem oriented charting Chief Complaint  Patient presents with   Hand Pain    Ongoing for two weeks She was attempting to put socks on her husband and heard a pop in her left hand     This visit occurred during the SARS-CoV-2 public health emergency.  Safety protocols were in place, including screening questions prior to the visit, additional usage of staff PPE, and extensive cleaning of exam room while observing appropriate contact time as indicated for disinfecting solutions.   Past Medical History-  Patient Active Problem List   Diagnosis Date Noted   Complete heart block ([place]) 04/05/2020    Priority: 1.   Osteoporosis 04/25/2020    Priority: 2.   Right carpal tunnel syndrome 05/02/2019    Priority: 2.   Diastolic dysfunction 89/38/1017    Priority: 2.   Hyperglycemia 10/09/2014    Priority: 2.   GERD (gastroesophageal reflux disease) 10/22/2013    Priority: 2.   CKD (chronic kidney disease) stage 3, GFR 30-59 ml/min (HCC) 05/05/2008    Priority: 2.   Anxiety state 04/20/2008    Priority: 2.   HYPERLIPIDEMIA 08/12/2007    Priority: 2.   Essential hypertension 08/12/2007    Priority: 2.   Rash 07/04/2017    Priority: 3.   Hiatal hernia 08/03/2013    Priority: 3.   Personal history of colonic polyps 01/09/2013    Priority: 3.   NIGHT SWEATS 02/19/2008    Priority: 3.   Pacemaker - MDT 08/11/2020   Aortic atherosclerosis ([place]) 04/26/2020    Medications- reviewed and updated Current Outpatient Medications  Medication Sig Dispense Refill   ALPRAZolam (XANAX) 0.25 MG tablet Take 1 tablet (0.25 mg total) by mouth 2 (two) times daily as needed for anxiety. 30 tablet 2   aspirin EC 81 MG tablet Take 81 mg by mouth daily.     Calcium in Bone Mineral Cmplx 350 MG MISC Take 3 each by mouth at bedtime. Takes 1 in the morning  and 2 at night     carvedilol (COREG) 6.25 MG tablet Take 1 tablet (6.25 mg total) by mouth 2 (two) times daily with a meal. 180 tablet 3   Cholecalciferol (VITAMIN D3) 2000 UNITS TABS Take 1 tablet by mouth daily with lunch.     esomeprazole (NEXIUM) 40 MG capsule TAKE 1 CAPSULE BY MOUTH TWICE A DAY BEFORE A MEAL 180 capsule 3   ezetimibe-simvastatin (VYTORIN) 10-20 MG tablet TAKE 1 TABLET BY MOUTH EVERYDAY AT BEDTIME 90 tablet 2   famotidine (PEPCID) 40 MG tablet TAKE 1 TABLET BY MOUTH EVERYDAY AT BEDTIME 90 tablet 3   folic acid (FOLVITE) 510 MCG tablet Take 400 mcg by mouth every morning.     furosemide (LASIX) 40 MG tablet TAKE 1 TABLET BY MOUTH EVERY DAY NEEDS APPT FOR REFILLS 90 tablet 0   gabapentin (NEURONTIN) 100 MG capsule Take 1 capsule (100 mg total) by mouth every 8 (eight) hours as needed (headache). 90 capsule 2   KLOR-CON M20 20 MEQ tablet TAKE 1 TABLET (20 MEQ TOTAL) BY MOUTH DAILY AS NEEDED. 90 tablet 1   losartan (COZAAR) 25 MG tablet Take 1 tablet (25 mg total) by mouth daily. 90 tablet 3   Magnesium 400 MG CAPS Take 400 mg by mouth at bedtime.     Omega-3 Fatty Acids (FISH  OIL) 500 MG CAPS Take 500 mg by mouth daily with lunch.     No current facility-administered medications for this visit.     Objective:  BP 135/81   Pulse 66   Temp 98 F (36.7 C) (Temporal)   Ht 4\' 11"  (1.499 m)   Wt 158 lb 9.6 oz (71.9 kg)   SpO2 98%   BMI 32.03 kg/m  Gen: NAD, resting comfortably CV: RRR no murmurs rubs or gallops Lungs: CTAB no crackles, wheeze, rhonchi Ext: no edema Skin: warm, dry Neuro: strength in left hand limited by pain Left 2nd MCP with tenderness on palpation- states hurts from MCP up to PIP but mostly tender along MCP joing.     Assessment and Plan   # left finger Pain S:patient reports today that she has been experiencing left hand pain for about 2 weeks. She states that she was at the nursing home where her husband resides and was trying to help put on  his sock when she felt something in her wrist had "popped". It hurt bad when it happened and has had ongoing pain. Left hand index finger/finger 2.   Has tried tylenol and ice- hurts all the way to the PIP joint as well. Hurts most intensely over the MCP joint. Pain level 2-3/10. If pushes her cruise control in car probably 7-8/10  Denies any redness. Does have swelling around MCP joint A/P: Left finger pain that started with a "pop" after trying to put husbands socks on. Wonder about ligament/tendon injury- patient with rather significant pain- offered x-ray vs sports med referral as they could also do ultrasound- she opts for sports medicine after discussion- continue ice and tylenol until visit   # Bilateral foot numbness/Neuropathic pain S:medication: Gabapentin 100 mg nightly - extra dose in the AM if tolerable- started by neurology Patient reported numbness in both of her feet since November 2021.  At visit in May patient reported low back pain with pain into both buttocks and was noticing some burning in her lower legs. We tried a low-dose of prednisone to see if that would be helpful (not ideal with prediabetes and osteoporosis). Original plan was sports medicine referral if failed to improve within 2 weeks-patient did not reach out but in late July went to the emergency room complaining of numbness in the left foot-was told etiology was unclear and advised to follow-up with PCP.  No labs or imaging were obtained at ER visit. Last visit she reported symptoms were definitely worse in the left foot- started in toes 1 and 2 and now whole foot up to ankle. Right foot lateral portion of foot only. Prednisone was helpful with her buttocks but not foot numbness. Buttocks was much better- sparingly hurts. Back did not improve as much as buttocks.   A referral to neurology was placed in the last visit and patient was seen by Dr. April Manson on 08/10/2021 for this issue and reported worsening of symptoms -  initially started with the left foot and eventually involved the complete left foot and right foot - on top and lateral part of the right foot. Also complained of right ankle pain. Numbness greater in left foot than right. Decreased sensation to light touch, pinprick, and vibration in the bilateral lower extremities up to the knees was noted on exam. This was likely though to be neuropathic pain despite patient not having diabetes. She was started on gabapentin per Dr. April Manson.  Today patient reports since issues are mainly numbness- gabapentin  has helped the pain portion   A/P: overall stable- continue to monitor. Gabapentin - now with basically no pain- just numbness  #hypertension S: medication: Carvedilol 6.25 mg BID , Lasix 40 mg daily, losartan 25 mg daily BP Readings from Last 3 Encounters:  08/31/21 135/81  08/15/21 (!) 110/58  08/10/21 120/74  A/P:blood pressure looks good despite decreases in meds- continue current meds   #hyperlipidemia/aortic atherosclerosis-LDL goal  ideally under 70 S: Medication: fish oil, Vytorin 10-20mg , aspirin 81mg - no bleeding history and takes due to strong family history of coronary artery disease Lab Results  Component Value Date   CHOL 146 06/19/2021   HDL 43.50 06/19/2021   LDLCALC 74 06/19/2021   TRIG 141.0 06/19/2021   CHOLHDL 3 06/19/2021  A/P: close to ideal control- at her age do not feel strongly about increasing dose further.     Recommended follow up: No follow-ups on file. Future Appointments  Date Time Provider [place]  10/02/2021  9:20 AM Marin Olp, MD [facility]  10/12/2021  8:40 AM [facility] DEVICE REMOTES [facility]  01/11/2022  8:40 AM [facility]  02/08/2022  1:45 PM Alric Ran, MD [facility] None  04/12/2022  8:40 AM [facility]ES [facility]   Lab/Order associations:   ICD-10-CM   1. Finger pain, left  M79.645 Ambulatory  referral to Sports Medicine    2. Neuropathic pain  M79.2     3. Essential hypertension  I10     4. HYPERLIPIDEMIA  E78.2      I,Harris Phan,acting as a scribe for Garret Reddish, MD.,have documented all relevant documentation on the behalf of Garret Reddish, MD,as directed by  Garret Reddish, MD while in the presence of Garret Reddish, MD.   I, Garret Reddish, MD, have reviewed all documentation for this visit. The documentation on 08/31/21 for the exam, diagnosis, procedures, and orders are all accurate and complete.   Return precautions advised.  Garret Reddish, MD

## 2021-08-31 NOTE — Patient Instructions (Addendum)
Health Maintenance Due  Topic Date Due   COVID-19 Vaccine (4 - Booster for [employer] series) - Recommend getting Omicron/Bivalent booster only at your local pharmacy! Please let us know when you have received this vaccination.  10/13/2020   We will call you within two weeks about your referral to Sports Medicine. If you do not hear within 2 weeks, give [place] a call. Hoping it will be within the next few weekdays though  Recommended follow up: Return for as needed for new, worsening, persistent symptoms. - keep regular visit in November otherwise

## 2021-09-01 DIAGNOSIS — M810 Age-related osteoporosis without current pathological fracture: Secondary | ICD-10-CM | POA: Diagnosis not present

## 2021-09-01 DIAGNOSIS — N183 Chronic kidney disease, stage 3 unspecified: Secondary | ICD-10-CM | POA: Diagnosis not present

## 2021-09-01 DIAGNOSIS — I129 Hypertensive chronic kidney disease with stage 1 through stage 4 chronic kidney disease, or unspecified chronic kidney disease: Secondary | ICD-10-CM | POA: Diagnosis not present

## 2021-09-05 NOTE — Progress Notes (Signed)
    Subjective:    CC: L hand/index finger pain  I, Molly Weber, LAT, ATC, am serving as scribe for Dr. Lynne Leader.  HPI: Pt is an 85 y/o female presenting w/ c/o L index finger pain x approximately 3-4 weeks that occurred when she was trying to put a sock on her husband's foot and experienced a "pop" in her L wrist/hand.  She was seen by her PCP for these c/o on 08/31/21.  She locates her pain to her L 2nd MCP joint and into her L 2nd MC.  L hand/finger swelling: yes at her L 2nd MCP  Aggravating factors: using her L hand; pressure to the area Treatments tried: Tylenol; ice  Pertinent review of Systems: No fevers or chills  Relevant historical information: Complete heart block   Objective:    Vitals:   09/06/21 0932  BP: 102/70  Pulse: 68  SpO2: 95%   General: Well Developed, well nourished, and in no acute distress.   MSK: Left second digit slight swelling at MCP otherwise normal-appearing Intact strength to finger extension and flexion including MCP PIP and DIP. Intact range of motion except for MCP with flexion is very slightly limited. Tender palpation at MCP. Pulses cap refill and sensation are intact distally.  Lab and Radiology Results  X-ray images left hand obtained today personally and independently interpreted No acute fractures at the second MCP.  Minimal degenerative changes. Await formal radiology review   Impression and Recommendations:    Assessment and Plan: 85 y.o. female with left hand pain at second MCP.  Thought to be injury to the joint or synovitis.  Doubtful for tendon injury.  Plan for referral to hand PT, buddy tape, and Voltaren gel.  Recheck in a month.  Return sooner if needed.  Precautions reviewed.Marland Kitchen  PDMP not reviewed this encounter. Orders Placed This Encounter  Procedures   DG Hand Complete Left    Standing Status:   Future    Number of Occurrences:   1    Standing Expiration Date:   10/07/2021    Order Specific Question:    Reason for Exam (SYMPTOM  OR DIAGNOSIS REQUIRED)    Answer:   L hand pain    Order Specific Question:   Preferred imaging location?    Answer:   [facility]   Ambulatory referral to Physical Therapy    Referral Priority:   Routine    Referral Type:   Physical Medicine    Referral Reason:   Specialty Services Required    Requested Specialty:   Physical Therapy    Number of Visits Requested:   1   No orders of the defined types were placed in this encounter.   Discussed warning signs or symptoms. Please see discharge instructions. Patient expresses understanding.   The above documentation has been reviewed and is accurate and complete Lynne Leader, M.D.

## 2021-09-06 ENCOUNTER — Other Ambulatory Visit: Payer: Self-pay

## 2021-09-06 ENCOUNTER — Ambulatory Visit (INDEPENDENT_AMBULATORY_CARE_PROVIDER_SITE_OTHER): Payer: Medicare PPO

## 2021-09-06 ENCOUNTER — Ambulatory Visit: Payer: Medicare PPO | Admitting: Family Medicine

## 2021-09-06 ENCOUNTER — Encounter: Payer: Self-pay | Admitting: Family Medicine

## 2021-09-06 ENCOUNTER — Ambulatory Visit: Payer: Self-pay

## 2021-09-06 VITALS — BP 102/70 | HR 68 | Ht 59.0 in | Wt 157.6 lb

## 2021-09-06 DIAGNOSIS — M25542 Pain in joints of left hand: Secondary | ICD-10-CM

## 2021-09-06 DIAGNOSIS — M79642 Pain in left hand: Secondary | ICD-10-CM | POA: Diagnosis not present

## 2021-09-06 NOTE — Patient Instructions (Addendum)
Nice to meet you today.  Please get an Xray today before you leave.  I've referred you to hand therapy.  Please let us know if you haven't heard from them in the next week regarding scheduling.  Please use Voltaren gel (Generic Diclofenac Gel) up to 4x daily for pain as needed.  This is available over-the-counter as both the name brand Voltaren gel and the generic diclofenac gel.   Con't to buddy tape your fingers together.  Follow-up: 4 weeks

## 2021-09-08 NOTE — Progress Notes (Signed)
Left hand x-ray shows medium arthritis at the base of the thumb.

## 2021-09-11 ENCOUNTER — Telehealth (INDEPENDENT_AMBULATORY_CARE_PROVIDER_SITE_OTHER): Payer: Medicare PPO | Admitting: Family Medicine

## 2021-09-11 ENCOUNTER — Encounter: Payer: Self-pay | Admitting: Family Medicine

## 2021-09-11 VITALS — Ht 59.02 in | Wt 154.0 lb

## 2021-09-11 DIAGNOSIS — U071 COVID-19: Secondary | ICD-10-CM | POA: Diagnosis not present

## 2021-09-11 DIAGNOSIS — I10 Essential (primary) hypertension: Secondary | ICD-10-CM

## 2021-09-11 MED ORDER — MOLNUPIRAVIR EUA 200MG CAPSULE: 4.0000 | ORAL_CAPSULE | Freq: Two times a day (BID) | ORAL | 0 refills | Status: AC

## 2021-09-11 NOTE — Progress Notes (Signed)
Phone [phone] Virtual visit via phonenote   Subjective:   Chief Complaint  Patient presents with   Covid Positive    Pt states she took an at home test this morning 10/31 and it came back positive. Symptoms started 10/29. Symptoms of headache, cough, dizziness.    This visit type was conducted due to national recommendations for restrictions regarding the COVID-19 Pandemic (e.g. social distancing).  This format is felt to be most appropriate for this patient at this time balancing risks to patient and risks to population by having him in for in person visit.  All issues noted in this document were discussed and addressed.  No physical exam was performed (except for noted visual exam or audio findings with Telehealth visits).  The patient has consented to conduct a Telehealth visit and understands insurance will be billed.   Our team/I connected with Mardella Layman at 12:00 PM EDT by phone (patient did not have equipment for webex) and verified that I am speaking with the correct person using two identifiers.  Location patient: [facility] Location provider: [facility], office Persons participating in the virtual visit:  patient  Time on phone: 11 minutes  Our team/I discussed the limitations of evaluation and management by telemedicine and the availability of in person appointments. In light of current covid-19 pandemic, patient also understands that we are trying to protect them by minimizing in office contact if at all possible.  The patient expressed consent for telemedicine visit and agreed to proceed. Patient understands insurance will be billed.   Past Medical History-  Patient Active Problem List   Diagnosis Date Noted   Complete heart block ([place]) 04/05/2020    Priority: High   Osteoporosis 04/25/2020    Priority: Medium    Right carpal tunnel syndrome 05/02/2019    Priority: Medium    Diastolic dysfunction 90/24/0973    Priority: Medium    Hyperglycemia 10/09/2014     Priority: Medium    GERD (gastroesophageal reflux disease) 10/22/2013    Priority: Medium    CKD (chronic kidney disease) stage 3, GFR 30-59 ml/min ([place]) 05/05/2008    Priority: Medium    Anxiety state 04/20/2008    Priority: Medium    HYPERLIPIDEMIA 08/12/2007    Priority: Medium    Essential hypertension 08/12/2007    Priority: Medium    Rash 07/04/2017    Priority: Low   Hiatal hernia 08/03/2013    Priority: Low   Personal history of colonic polyps 01/09/2013    Priority: Low   NIGHT SWEATS 02/19/2008    Priority: Low   Pacemaker - MDT 08/11/2020   Aortic atherosclerosis ([place]) 04/26/2020    Medications- reviewed and updated Current Outpatient Medications  Medication Sig Dispense Refill   ALPRAZolam (XANAX) 0.25 MG tablet Take 1 tablet (0.25 mg total) by mouth 2 (two) times daily as needed for anxiety. 30 tablet 2   aspirin EC 81 MG tablet Take 81 mg by mouth daily.     Calcium in Bone Mineral Cmplx 350 MG MISC Take 3 each by mouth at bedtime. Takes 1 in the morning and 2 at night     carvedilol (COREG) 6.25 MG tablet Take 1 tablet (6.25 mg total) by mouth 2 (two) times daily with a meal. 180 tablet 3   Cholecalciferol (VITAMIN D3) 2000 UNITS TABS Take 1 tablet by mouth daily with lunch.     esomeprazole (NEXIUM) 40 MG capsule TAKE 1 CAPSULE BY MOUTH TWICE A DAY BEFORE A MEAL 180  capsule 3   ezetimibe-simvastatin (VYTORIN) 10-20 MG tablet TAKE 1 TABLET BY MOUTH EVERYDAY AT BEDTIME 90 tablet 2   famotidine (PEPCID) 40 MG tablet TAKE 1 TABLET BY MOUTH EVERYDAY AT BEDTIME 90 tablet 3   folic acid (FOLVITE) 314 MCG tablet Take 400 mcg by mouth every morning.     furosemide (LASIX) 40 MG tablet TAKE 1 TABLET BY MOUTH EVERY DAY NEEDS APPT FOR REFILLS 90 tablet 0   gabapentin (NEURONTIN) 100 MG capsule Take 1 capsule (100 mg total) by mouth every 8 (eight) hours as needed (headache). 90 capsule 2   KLOR-CON M20 20 MEQ tablet TAKE 1 TABLET (20 MEQ TOTAL) BY MOUTH DAILY AS NEEDED. 90  tablet 1   losartan (COZAAR) 25 MG tablet Take 1 tablet (25 mg total) by mouth daily. 90 tablet 3   Magnesium 400 MG CAPS Take 400 mg by mouth at bedtime.     Omega-3 Fatty Acids (FISH OIL) 500 MG CAPS Take 500 mg by mouth daily with lunch.     molnupiravir EUA (LAGEVRIO) 200 mg CAPS capsule Take 4 capsules (800 mg total) by mouth 2 (two) times daily for 5 days. 40 capsule 0   No current facility-administered medications for this visit.     Objective:  Ht 4' 11.02" (1.499 m)   Wt 154 lb (69.9 kg)   BMI 31.09 kg/m  self reported vitals . No fever- temp 98.8 baseline in 97s Nonlabored voice, normal speech      Assessment and Plan   # Positive for COVID S:home test postive this morning. Symptoms started on 10/29- has headache that was rather significant this morning, cough, some dizziness. No shortness of breath A/P: Patient with testing confirming covid 19 with first day of covid 19 symptoms on oct 29th as listed Vaccination status:3 total- has not had booster yet for bivalent   Therefore: - recommended patient watch closely for shortness of breath or confusion or worsening symptoms and if those occur patient should contact us immediately or seek care in the emergency department -recommended patient consider purchasing pulse oximeter and if levels 94% or below persistently- seek care at the hospital - Patient needs to self isolate  for at least 5 days since first symptom AND at least 24 hours fever free without fever reducing medications AND have improvement in respiratory symptoms . After 5 days can end self isolation but still needs to wear mask for additional 5 days .  -Patient should inform close contacts about exposure (anyone patient been around unmasked for more than 15 minutes)   If High risk for complications-we discussed outpatient therapeutic options including paxlovid (and risk of rebound), molnupiravir, MAB infusion - patient opted for molnupiravir (restricted on paxlovid  due to kidney function GFR <30)   #hypertension S: medication: Carvedilol 6.25 mg twice daily , Lasix 40 mg daily, losartan 25 mg daily BP Readings from Last 3 Encounters:  09/06/21 102/70  08/31/21 135/81  08/15/21 (!) 110/58  A/P: short term on BP with feeling dizzy advised half dose of each of her meds- potentially could hold lasix unless notes swelling and push fluids- main thing I have seen with covid in her age group is dehdyration related hospitalizations  Recommended follow up:  Future Appointments  Date Time Provider [place]  10/02/2021  9:20 AM Marin Olp, MD [facility]  10/04/2021  9:30 AM Gregor Hams, MD [facility] None  10/12/2021  8:40 AM [facility] DEVICE REMOTES [facility]  01/11/2022  8:40  AM [facility] DEVICE REMOTES [facility]  02/08/2022  1:45 PM Alric Ran, MD [facility] None  04/12/2022  8:40 AM [facility] DEVICE REMOTES [facility]   Lab/Order associations:   ICD-10-CM   1. COVID  U07.1     2. Essential hypertension  I10       Meds ordered this encounter  Medications   molnupiravir EUA (LAGEVRIO) 200 mg CAPS capsule    Sig: Take 4 capsules (800 mg total) by mouth 2 (two) times daily for 5 days.    Dispense:  40 capsule    Refill:  0   I,Jada Bradford,acting as a scribe for Garret Reddish, MD.,have documented all relevant documentation on the behalf of Garret Reddish, MD,as directed by  Garret Reddish, MD while in the presence of Garret Reddish, MD.  I, Garret Reddish, MD, have reviewed all documentation for this visit. The documentation on 09/11/21 for the exam, diagnosis, procedures, and orders are all accurate and complete.  Return precautions advised.  Garret Reddish, MD

## 2021-09-25 ENCOUNTER — Other Ambulatory Visit: Payer: Self-pay | Admitting: Family Medicine

## 2021-09-25 NOTE — Progress Notes (Signed)
Phone [phone] In person visit   Subjective:   Kylie Patrick is a 85 y.o. year old very pleasant female patient who presents for/with See problem oriented charting Chief Complaint  Patient presents with   Annual Exam    Fasting.   Hypertension    This visit occurred during the SARS-CoV-2 public health emergency.  Safety protocols were in place, including screening questions prior to the visit, additional usage of staff PPE, and extensive cleaning of exam room while observing appropriate contact time as indicated for disinfecting solutions.   Past Medical History-  Patient Active Problem List   Diagnosis Date Noted   Complete heart block ([place]) 04/05/2020    Priority: High   Osteoporosis 04/25/2020    Priority: Medium    Right carpal tunnel syndrome 05/02/2019    Priority: Medium    Diastolic dysfunction 62/83/1517    Priority: Medium    Hyperglycemia 10/09/2014    Priority: Medium    GERD (gastroesophageal reflux disease) 10/22/2013    Priority: Medium    CKD (chronic kidney disease) stage 3, GFR 30-59 ml/min ([place]) 05/05/2008    Priority: Medium    Anxiety state 04/20/2008    Priority: Medium    HYPERLIPIDEMIA 08/12/2007    Priority: Medium    Essential hypertension 08/12/2007    Priority: Medium    Rash 07/04/2017    Priority: Low   Hiatal hernia 08/03/2013    Priority: Low   Personal history of colonic polyps 01/09/2013    Priority: Low   NIGHT SWEATS 02/19/2008    Priority: Low   Pacemaker - MDT 08/11/2020   Aortic atherosclerosis ([place]) 04/26/2020    Medications- reviewed and updated Current Outpatient Medications  Medication Sig Dispense Refill   ALPRAZolam (XANAX) 0.25 MG tablet Take 1 tablet (0.25 mg total) by mouth 2 (two) times daily as needed for anxiety. 30 tablet 2   aspirin EC 81 MG tablet Take 81 mg by mouth daily.     Calcium in Bone Mineral Cmplx 350 MG MISC Take 3 each by mouth at bedtime. Takes 1 in the morning and 2 at night      carvedilol (COREG) 6.25 MG tablet Take 1 tablet (6.25 mg total) by mouth 2 (two) times daily with a meal. 180 tablet 3   Cholecalciferol (VITAMIN D3) 2000 UNITS TABS Take 1 tablet by mouth daily with lunch.     esomeprazole (NEXIUM) 40 MG capsule TAKE 1 CAPSULE BY MOUTH TWICE A DAY BEFORE A MEAL 180 capsule 3   ezetimibe-simvastatin (VYTORIN) 10-20 MG tablet TAKE 1 TABLET BY MOUTH EVERYDAY AT BEDTIME 90 tablet 2   famotidine (PEPCID) 40 MG tablet TAKE 1 TABLET BY MOUTH EVERYDAY AT BEDTIME 90 tablet 3   folic acid (FOLVITE) 616 MCG tablet Take 400 mcg by mouth every morning.     gabapentin (NEURONTIN) 100 MG capsule Take 1 capsule (100 mg total) by mouth every 8 (eight) hours as needed (headache). 90 capsule 2   KLOR-CON M20 20 MEQ tablet TAKE 1 TABLET (20 MEQ TOTAL) BY MOUTH DAILY AS NEEDED. 90 tablet 1   losartan (COZAAR) 25 MG tablet Take 1 tablet (25 mg total) by mouth daily. 90 tablet 3   Magnesium 400 MG CAPS Take 400 mg by mouth at bedtime.     Omega-3 Fatty Acids (FISH OIL) 500 MG CAPS Take 500 mg by mouth daily with lunch.     furosemide (LASIX) 40 MG tablet Take 1 tablet (40 mg total) by mouth daily.  90 tablet 3   No current facility-administered medications for this visit.     Objective:  BP 112/66   Pulse 76   Temp 97.9 F (36.6 C)   Ht 4\' 11"  (1.499 m)   Wt 152 lb 3.2 oz (69 kg)   SpO2 98%   BMI 30.74 kg/m  Gen: NAD, resting comfortably CV: RRR no murmurs rubs or gallops Lungs: CTAB no crackles, wheeze, rhonchi Ext: no edema Skin: warm, dry    Assessment and Plan   # Hx of COVID + - tested positive on the morning of 09/11/2021 and experienced some dizziness and cough but no shortness of breath. Had 3 covid shots but didn't have booster.  See vaccination discussion below.  Current symptoms: no lingering symptoms   #Health maintenance-advised must wait at least 3 months after COVID from October before getting bivalent vaccination   #hypertension S: medication:  Carvedilol 6.25 mg twice daily , Lasix 40 mg daily, losartan 25 mg daily BP Readings from Last 3 Encounters:  10/02/21 112/66  09/06/21 102/70  08/31/21 135/81  A/P:  Controlled. Continue current medications.   - took potassium for 5 days and has more available if needed  #Chronic kidney disease stage III S: GFR is typically in the 30s range -Patient knows to avoid NSAIDs  A/P: follows with Dr. Moshe Cipro- we will check CMP and send results to [place] [facility]   #hyperlipidemia/aortic atherosclerosis-LDL goal  ideally under 70 S: Medication: fish oil, Vytorin 10-20mg  at bedtime, aspirin 81mg  daily- no bleeding history and takes due to strong family history of coronary artery disease Lab Results  Component Value Date   CHOL 146 06/19/2021   HDL 43.50 06/19/2021   LDLCALC 74 06/19/2021   TRIG 141.0 06/19/2021   CHOLHDL 3 06/19/2021   A/P: LDL has been extremely close to ideal goal of 70 or less at 74-discussed working on this with healthy eating/regular exercise-goal under 70 due to aortic atherosclerosis  # GERD S:Medication: Nexium 40 mg along with Pepcid per Dr. Arelia Longest. Was having issues at night prior to pepcid addition  A/P: working well right now- continue Gi follow up    # Bilateral foot numbness/Neuropathic pain S:medication: Gabapentin 100 mg nightly - extra dose in the AM if tolerable- started by neurology Patient reported numbness in both of her feet since November 2021.  Numbness has persisted but pain has improved with this A/P:Doing well-continue current medication. She wishes we could get rid of numbness but I do not have a good solution for that  # left finger Pain S:patient reported that she had been experiencing left hand pain October 2022. She stated that she was at the nursing home where her husband resided and tried to help put on his sock when she felt something in her wrist had "popped". It hurt bad when it happened and had ongoing pain. Left hand index  finger/finger 2.    Had tried tylenol and ice- hurts all the way to the PIP joint as well. Did hurt most intensely over the MCP joint. Pain level 2-3/10. If pushed her cruise control in car probably 7-8/10   Denied any redness. Does have swelling around MCP joint. Was told to continue ice and tylenol until visit  -offered x-ray vs sports med referral as they could also do ultrasound- she opted for sports medicine after our discussion on 10/22 visit.  A/P: she is doing therapy and has follow up with Dr. Georgina Snell- not seeing a big difference yet- will see  what next steps are with him   # Hyperglycemia/insulin resistance/prediabetes S:  Medication: none Exercise and diet- weight down 9 pounds from May visit. She wonders if stress contributed though- she tends to eat less when stressed Lab Results  Component Value Date   HGBA1C 5.8 03/31/2021   HGBA1C 5.7 (H) 10/10/2020   HGBA1C 5.9 04/18/2020   A/P: hopefully stable- update a1c today. Continue without meds for now  # Anxiety S:Medication:  Xanax once a month or so- very sparing and typically related to frustrations with her husband- doing better with him in nursing facility.  No falls or injuries reported  A/P: stable- continue current meds . Most common at night  #Osteoporosis- still has not had prolia shot. She is taking calcium 1200mg  and vitamin D 1000 units.  -- reclast cannot take due to renal function.  - Prior refer to [facility] with osteoporosis but crcl under 35  % complete heart block- Led to pacemaker placement - follows with Dr. Caryl Comes yearly. q3 month checks at home   Recommended follow up: Return in about 6 months (around 04/01/2022) for physical or sooner if needed. Future Appointments  Date Time Provider [place]  10/04/2021  9:45 AM Gregor Hams, MD [facility] None  10/12/2021  8:40 AM [facility] DEVICE REMOTES [facility]  01/11/2022  8:40 AM [facility] DEVICE REMOTES [facility]  02/08/2022   1:45 PM Alric Ran, MD [facility] None  04/12/2022  8:40 AM [facility] DEVICE [facility]OTES [facility]    Lab/Order associations:   ICD-10-CM   1. Essential hypertension  I10 CBC with Differential/Platelet    Renal Function Panel    Hepatic function panel    2. Finger pain, left  M79.645     3. Numbness and tingling of foot  R20.0    R20.2     4. HYPERLIPIDEMIA  E78.2 CBC with Differential/Platelet    Renal Function Panel    Hepatic function panel    5. Aortic atherosclerosis (HCC)  I70.0     6. History of COVID-19  Z86.16     7. Hyperglycemia  R73.9 Hemoglobin A1c    8. Stage 3 chronic kidney disease, unspecified whether stage 3a or 3b CKD (HCC)  N18.30 Renal Function Panel      Meds ordered this encounter  Medications   furosemide (LASIX) 40 MG tablet    Sig: Take 1 tablet (40 mg total) by mouth daily.    Dispense:  90 tablet    Refill:  3    I,Jada Bradford,acting as a scribe for Garret Reddish, MD.,have documented all relevant documentation on the behalf of Garret Reddish, MD,as directed by  Garret Reddish, MD while in the presence of Garret Reddish, MD.   I, Garret Reddish, MD, have reviewed all documentation for this visit. The documentation on 10/02/21 for the exam, diagnosis, procedures, and orders are all accurate and complete.   Return precautions advised.  Garret Reddish, MD

## 2021-09-27 DIAGNOSIS — M79645 Pain in left finger(s): Secondary | ICD-10-CM | POA: Diagnosis not present

## 2021-09-27 DIAGNOSIS — R2232 Localized swelling, mass and lump, left upper limb: Secondary | ICD-10-CM | POA: Diagnosis not present

## 2021-09-27 DIAGNOSIS — M6281 Muscle weakness (generalized): Secondary | ICD-10-CM | POA: Diagnosis not present

## 2021-09-27 DIAGNOSIS — M79642 Pain in left hand: Secondary | ICD-10-CM | POA: Diagnosis not present

## 2021-10-02 ENCOUNTER — Ambulatory Visit (INDEPENDENT_AMBULATORY_CARE_PROVIDER_SITE_OTHER): Payer: Medicare PPO | Admitting: Family Medicine

## 2021-10-02 ENCOUNTER — Encounter: Payer: Self-pay | Admitting: Family Medicine

## 2021-10-02 ENCOUNTER — Other Ambulatory Visit: Payer: Self-pay

## 2021-10-02 VITALS — BP 112/66 | HR 76 | Temp 97.9°F | Ht 59.0 in | Wt 152.2 lb

## 2021-10-02 DIAGNOSIS — R200 Anesthesia of skin: Secondary | ICD-10-CM

## 2021-10-02 DIAGNOSIS — I10 Essential (primary) hypertension: Secondary | ICD-10-CM

## 2021-10-02 DIAGNOSIS — I700 Atherosclerosis of aorta: Secondary | ICD-10-CM | POA: Diagnosis not present

## 2021-10-02 DIAGNOSIS — Z8616 Personal history of COVID-19: Secondary | ICD-10-CM | POA: Diagnosis not present

## 2021-10-02 DIAGNOSIS — R739 Hyperglycemia, unspecified: Secondary | ICD-10-CM | POA: Diagnosis not present

## 2021-10-02 DIAGNOSIS — E782 Mixed hyperlipidemia: Secondary | ICD-10-CM

## 2021-10-02 DIAGNOSIS — R202 Paresthesia of skin: Secondary | ICD-10-CM | POA: Diagnosis not present

## 2021-10-02 DIAGNOSIS — M79645 Pain in left finger(s): Secondary | ICD-10-CM | POA: Diagnosis not present

## 2021-10-02 DIAGNOSIS — N183 Chronic kidney disease, stage 3 unspecified: Secondary | ICD-10-CM | POA: Diagnosis not present

## 2021-10-02 LAB — HEPATIC FUNCTION PANEL
ALT: 13 U/L (ref 0–35)
AST: 24 U/L (ref 0–37)
Albumin: 4.2 g/dL (ref 3.5–5.2)
Alkaline Phosphatase: 52 U/L (ref 39–117)
Bilirubin, Direct: 0.1 mg/dL (ref 0.0–0.3)
Total Bilirubin: 0.7 mg/dL (ref 0.2–1.2)
Total Protein: 6.9 g/dL (ref 6.0–8.3)

## 2021-10-02 LAB — CBC WITH DIFFERENTIAL/PLATELET
Basophils Absolute: 0.1 10*3/uL (ref 0.0–0.1)
Basophils Relative: 0.8 % (ref 0.0–3.0)
Eosinophils Absolute: 0.2 10*3/uL (ref 0.0–0.7)
Eosinophils Relative: 3.3 % (ref 0.0–5.0)
HCT: 41.6 % (ref 36.0–46.0)
Hemoglobin: 13.6 g/dL (ref 12.0–15.0)
Lymphocytes Relative: 21.9 % (ref 12.0–46.0)
Lymphs Abs: 1.5 10*3/uL (ref 0.7–4.0)
MCHC: 32.7 g/dL (ref 30.0–36.0)
MCV: 89.2 fl (ref 78.0–100.0)
Monocytes Absolute: 0.7 10*3/uL (ref 0.1–1.0)
Monocytes Relative: 9.3 % (ref 3.0–12.0)
Neutro Abs: 4.5 10*3/uL (ref 1.4–7.7)
Neutrophils Relative %: 64.7 % (ref 43.0–77.0)
Platelets: 241 10*3/uL (ref 150.0–400.0)
RBC: 4.66 Mil/uL (ref 3.87–5.11)
RDW: 14.7 % (ref 11.5–15.5)
WBC: 7 10*3/uL (ref 4.0–10.5)

## 2021-10-02 LAB — RENAL FUNCTION PANEL
Albumin: 4.2 g/dL (ref 3.5–5.2)
BUN: 18 mg/dL (ref 6–23)
CO2: 33 mEq/L — ABNORMAL HIGH (ref 19–32)
Calcium: 10.3 mg/dL (ref 8.4–10.5)
Chloride: 99 mEq/L (ref 96–112)
Creatinine, Ser: 1.48 mg/dL — ABNORMAL HIGH (ref 0.40–1.20)
GFR: 32.58 mL/min — ABNORMAL LOW (ref >60.00)
Glucose, Bld: 100 mg/dL — ABNORMAL HIGH (ref 70–99)
Phosphorus: 3.8 mg/dL (ref 2.3–4.6)
Potassium: 3.3 mEq/L — ABNORMAL LOW (ref 3.5–5.1)
Sodium: 141 mEq/L (ref 135–145)

## 2021-10-02 LAB — HEMOGLOBIN A1C: Hgb A1c MFr Bld: 5.8 % (ref 4.6–6.5)

## 2021-10-02 MED ORDER — FUROSEMIDE 40 MG PO TABS: 40.0000 mg | ORAL_TABLET | Freq: Every day | ORAL | 3 refills | Status: DC

## 2021-10-02 NOTE — Patient Instructions (Addendum)
Health Maintenance Due  Topic Date Due   COVID-19 Vaccine (4 - Booster for [employer] series) -   -wait at least 3 months to get your bivalent booster shot since you recently had covid. When you receive, please send [place] the date.  10/13/2020   Team please reach out to [facility] about her prolia shot- this has never been set up  Please stop by lab before you go If you have mychart- we will send your results within 3 business days of [place] receiving them.  If you do not have mychart- we will call you about results within 5 business days of [place] receiving them.  *please also note that you will see labs on mychart as soon as they post. I will later go in and write notes on them- will say "notes from Dr. Yong Channel"  Recommended follow up: Return in about 6 months (around 04/01/2022) for physical or sooner if needed.  Please have a Happy Thanksgiving!

## 2021-10-02 NOTE — Progress Notes (Signed)
   I, Wendy Poet, LAT, ATC, am serving as scribe for Dr. Lynne Leader.  Kylie Patrick is a 85 y.o. female who presents to [place] [facility] today for f/u of L index finger and L 2nd MCPJ pain.  She was last seen by Dr. Georgina Snell on 09/06/21 and was referred to hand therapy at [facility].  She was also advised to use Voltaren gel and to buddy tape her fingers.  Today, pt reports that her L index finger is much better.  She has completed PT at [facility] and has been d/c.  She has been doing her HEP as prescribed by PT.  She rates her improvement at 80%.  She also notes some numbness and tingling into her hand.  This has been ongoing for years worsening recently.  She attributes this to carpal tunnel syndrome.  Previously she did well with a carpal tunnel brace but has not used it for a while.  Diagnostic imaging: L hand XR- 09/06/21  Pertinent review of systems: No fevers or chills  Relevant historical information: Hypertension.  Heart block.   Exam:  BP 94/62 (BP Location: Left Arm, Patient Position: Sitting, Cuff Size: Normal)   Pulse (!) 47   Ht 4\' 11"  (1.499 m)   Wt 154 lb 6.4 oz (70 kg)   SpO2 94%   BMI 31.19 kg/m  General: Well Developed, well nourished, and in no acute distress.   MSK: Right wrist normal appearing. Mildly positive Tinel's carpal tunnel. Mildly positive Phalen's test.     Assessment and Plan: 85 y.o. female with left index finger MCP synovitis.  Improved with PT.  Continue home exercise program recheck as needed.  Can reauthorize hand PT as needed.  Right carpal tunnel syndrome.  Retrying wrist splint at bedtime.  If not improving in 1 month consider steroid injection.  Patient will call me if needed.    Discussed warning signs or symptoms. Please see discharge instructions. Patient expresses understanding.   The above documentation has been reviewed and is accurate and complete Lynne Leader, M.D.

## 2021-10-03 DIAGNOSIS — M79645 Pain in left finger(s): Secondary | ICD-10-CM | POA: Diagnosis not present

## 2021-10-03 DIAGNOSIS — M6281 Muscle weakness (generalized): Secondary | ICD-10-CM | POA: Diagnosis not present

## 2021-10-03 DIAGNOSIS — M79642 Pain in left hand: Secondary | ICD-10-CM | POA: Diagnosis not present

## 2021-10-03 DIAGNOSIS — R2232 Localized swelling, mass and lump, left upper limb: Secondary | ICD-10-CM | POA: Diagnosis not present

## 2021-10-04 ENCOUNTER — Other Ambulatory Visit: Payer: Self-pay

## 2021-10-04 ENCOUNTER — Ambulatory Visit (INDEPENDENT_AMBULATORY_CARE_PROVIDER_SITE_OTHER): Payer: Medicare PPO | Admitting: Family Medicine

## 2021-10-04 ENCOUNTER — Encounter: Payer: Self-pay | Admitting: Family Medicine

## 2021-10-04 VITALS — BP 94/62 | HR 47 | Ht 59.0 in | Wt 154.4 lb

## 2021-10-04 DIAGNOSIS — M25542 Pain in joints of left hand: Secondary | ICD-10-CM

## 2021-10-04 DIAGNOSIS — G5601 Carpal tunnel syndrome, right upper limb: Secondary | ICD-10-CM

## 2021-10-04 NOTE — Patient Instructions (Addendum)
Good to see you today.  Con't your home exercises as prescribed by PT.  Please use a carpal tunnel wrist brace at night/bedtime.  Follow-up: in one month if wrist/hand not better.  Otherwise follow-up as needed.

## 2021-10-12 ENCOUNTER — Ambulatory Visit (INDEPENDENT_AMBULATORY_CARE_PROVIDER_SITE_OTHER): Payer: Medicare PPO

## 2021-10-12 DIAGNOSIS — I442 Atrioventricular block, complete: Secondary | ICD-10-CM | POA: Diagnosis not present

## 2021-10-12 LAB — CUP PACEART REMOTE DEVICE CHECK
Battery Remaining Longevity: 25 mo
Battery Voltage: 2.95 V
Brady Statistic AP VP Percent: 17.6 %
Brady Statistic AP VS Percent: 0 %
Brady Statistic AS VP Percent: 82.33 %
Brady Statistic AS VS Percent: 0.07 %
Brady Statistic RA Percent Paced: 17.56 %
Brady Statistic RV Percent Paced: 99.81 %
Date Time Interrogation Session: 20221201104245
Implantable Lead Implant Date: 20170906
Implantable Lead Implant Date: 20170906
Implantable Lead Location: 753859
Implantable Lead Location: 753860
Implantable Lead Model: 3830
Implantable Lead Model: 5076
Implantable Pulse Generator Implant Date: 20170906
Lead Channel Impedance Value: 304 Ohm
Lead Channel Impedance Value: 399 Ohm
Lead Channel Impedance Value: 437 Ohm
Lead Channel Impedance Value: 513 Ohm
Lead Channel Pacing Threshold Amplitude: 0.5 V
Lead Channel Pacing Threshold Amplitude: 1.375 V
Lead Channel Pacing Threshold Pulse Width: 0.4 ms
Lead Channel Pacing Threshold Pulse Width: 0.4 ms
Lead Channel Sensing Intrinsic Amplitude: 2.25 mV
Lead Channel Sensing Intrinsic Amplitude: 2.25 mV
Lead Channel Sensing Intrinsic Amplitude: 5.25 mV
Lead Channel Sensing Intrinsic Amplitude: 5.25 mV
Lead Channel Setting Pacing Amplitude: 2 V
Lead Channel Setting Pacing Amplitude: 2.5 V
Lead Channel Setting Pacing Pulse Width: 1 ms
Lead Channel Setting Sensing Sensitivity: 0.9 mV

## 2021-10-18 DIAGNOSIS — H04123 Dry eye syndrome of bilateral lacrimal glands: Secondary | ICD-10-CM | POA: Diagnosis not present

## 2021-10-23 NOTE — Progress Notes (Signed)
Remote pacemaker transmission.   

## 2021-11-27 ENCOUNTER — Telehealth: Payer: Self-pay | Admitting: Family Medicine

## 2021-11-27 ENCOUNTER — Ambulatory Visit: Payer: Medicare PPO | Admitting: Family Medicine

## 2021-11-27 ENCOUNTER — Encounter: Payer: Self-pay | Admitting: Family Medicine

## 2021-11-27 ENCOUNTER — Other Ambulatory Visit: Payer: Self-pay

## 2021-11-27 VITALS — BP 149/68 | HR 71 | Temp 98.2°F | Ht 59.0 in | Wt 158.0 lb

## 2021-11-27 DIAGNOSIS — R109 Unspecified abdominal pain: Secondary | ICD-10-CM | POA: Diagnosis not present

## 2021-11-27 DIAGNOSIS — I10 Essential (primary) hypertension: Secondary | ICD-10-CM

## 2021-11-27 DIAGNOSIS — N183 Chronic kidney disease, stage 3 unspecified: Secondary | ICD-10-CM

## 2021-11-27 LAB — URINALYSIS, ROUTINE W REFLEX MICROSCOPIC
Bilirubin Urine: NEGATIVE
Hgb urine dipstick: NEGATIVE
Ketones, ur: NEGATIVE
Leukocytes,Ua: NEGATIVE
Nitrite: NEGATIVE
Specific Gravity, Urine: 1.01 (ref 1.000–1.030)
Total Protein, Urine: NEGATIVE
Urine Glucose: NEGATIVE
Urobilinogen, UA: 0.2 (ref 0.0–1.0)
pH: 7 (ref 5.0–8.0)

## 2021-11-27 MED ORDER — NITROFURANTOIN MONOHYD MACRO 100 MG PO CAPS: 100.0000 mg | ORAL_CAPSULE | Freq: Two times a day (BID) | ORAL | 0 refills | Status: DC

## 2021-11-27 NOTE — Patient Instructions (Signed)
It was very nice to see you today!  We we will check a urine sample today.  Please start the antibiotic.  Let us know if your symptoms are not improving.   Take care, Dr Jerline Pain  PLEASE NOTE:  If you had any lab tests please let us know if you have not heard back within a few days. You may see your results on mychart before we have a chance to review them but we will give you a call once they are reviewed by [place]. If we ordered any referrals today, please let us know if you have not heard from their office within the next week.   Please try these tips to maintain a healthy lifestyle:  Eat at least 3 REAL meals and 1-2 snacks per day.  Aim for no more than 5 hours between eating.  If you eat breakfast, please do so within one hour of getting up.   Each meal should contain half fruits/vegetables, one quarter protein, and one quarter carbs (no bigger than a computer mouse)  Cut down on sweet beverages. This includes juice, soda, and sweet tea.   Drink at least 1 glass of water with each meal and aim for at least 8 glasses per day  Exercise at least 150 minutes every week.

## 2021-11-27 NOTE — Progress Notes (Signed)
° °  Kylie Patrick is a 86 y.o. female who presents today for an office visit.  Assessment/Plan:  New/Acute Problems: Suprapubic pain No red flags.  Reassuring abdominal exam.  She is concerned about possible cystitis.  We will check UA and urine culture today.  Empirically start Macrobid until culture results return.  Encourage good oral hydration.  We discussed reasons to return to care.  If symptoms persist and her urine culture is negative would consider further imaging with CT scan or KUB.   Chronic Problems Addressed Today: Essential Hypertension Slightly above her baseline but at goal per JNC 8.  We will continue Coreg 6.25 mg twice daily and losartan 25 mg daily.  She will continue to monitor at home and let us know if persistently elevated.    Subjective:  HPI: Patient here with abdominal pain. Started yesterday suddenly with a sharp pain in her lower abdomen. It was a lot worse this morning. She got up and moved around which helped. No nausea or vomiting. No constipation or diarrhea. No fevers or chills. No treatments tried. She is concerned about a bladder infection. Feels like previous UTIs. No dysuria but has noticed a darker color.   She has been under a lot of stress recently due to the health of her husband and daughter.        Objective:  Physical Exam: BP (!) 149/68    Pulse 71    Temp 98.2 F (36.8 C) (Temporal)    Ht 4\' 11"  (1.499 m)    Wt 158 lb (71.7 kg)    SpO2 98%    BMI 31.91 kg/m   Gen: No acute distress, resting comfortably CV: Regular rate and rhythm with no murmurs appreciated Pulm: Normal work of breathing, clear to auscultation bilaterally with no crackles, wheezes, or rhonchi GI: S, NT, ND, bowel sounds present Neuro: Grossly normal, moves all extremities Psych: Normal affect and thought content      Kylie Patrick M. Jerline Pain, MD 11/27/2021 11:00 AM

## 2021-11-27 NOTE — Telephone Encounter (Signed)
Pt was seen in our office on 11/27/21 at 10:40am.   Patient Name: Kylie Patrick [facility] Gender: Female DOB: 11/20/1935 Age: 86 Y 25 M 9 D Return Phone Number: [phone] (Primary) Address: City/ State/ Zip: [place]  [address] Client [place] [facility] [place] Client Site [place] [facility] [place] Night Provider Garret Reddish- MD Contact Type Call Who Is Calling Patient / Member / Family / Caregiver Call Type Triage / Clinical Relationship To Patient Self Return Phone Number [phone] (Primary) Chief Complaint Abdominal Pain Reason for Call Request to Schedule Office Appointment Initial Comment Caller states she needs to see Dr. Yong Channel today. Thinks she has a problem with her kidney. Has a lot of pressure at bottom of stomach. Translation No Nurse Assessment Nurse: Lucky Cowboy, RN, Levada Dy Date/Time (Eastern Time): 11/27/2021 8:26:05 AM Confirm and document reason for call. If symptomatic, describe symptoms. ---Caller stated that she has been having abd pressure since yesterday. No burning on urination, fever, blood in urine, back pain. Does the patient have any new or worsening symptoms? ---Yes Will a triage be completed? ---Yes Related visit to physician within the last 2 weeks? ---No Does the PT have any chronic conditions? (i.e. diabetes, asthma, this includes High risk factors for pregnancy, etc.) ---Yes List chronic conditions. ---GERD, HTN Is this a behavioral health or substance abuse call? ---No Guidelines Guideline Title Affirmed Question Affirmed Notes Nurse Date/Time (Eastern Time) Abdominal Pain - Female [1] MODERATE pain (e.g., interferes with normal activities) AND [2] pain comes and goes (cramps) AND [3] present > 24 hours (Exception: pain with Dew, RN, Levada Dy 11/27/2021 8:27:58 AM  Guidelines Guideline Title Affirmed Question Affirmed Notes Nurse Date/Time Eilene Ghazi Time) Vomiting or Diarrhea - see that  Guideline) Disp. Time Eilene Ghazi Time) Disposition Final User 11/27/2021 8:31:30 AM See PCP within 24 Hours Yes Dew, RN, Marin Shutter Disagree/Comply Comply Caller Understands Yes PreDisposition Home Care Care Advice Given Per Guideline SEE PCP WITHIN 24 HOURS: * IF OFFICE WILL BE OPEN: You need to be examined within the next 24 hours. Call your doctor (or NP/PA) when the office opens and make an appointment. CRAMPS: * Your cramps may be due to an intestinal virus or from something that you ate. * During cramps, drink some water, then lie down and try to find a comfortable position. DIET: * Drink adequate fluids. Eat a bland diet. * Avoid alcohol or caffeinated beverages * Avoid greasy or fatty foods. CALL BACK IF: * Severe pain lasts over 1 hour * Constant pain lasts over 2 hours * You become worse CARE ADVICE given per Abdominal Pain, Female (Adult) guideline. Referrals REFERRED TO PCP OFFICE

## 2021-11-29 LAB — URINE CULTURE
MICRO NUMBER:: 12875172
SPECIMEN QUALITY:: ADEQUATE

## 2021-11-30 NOTE — Progress Notes (Signed)
Please inform patient of the following:  Her urine culture confirms UTI. The antibiotic we have her on should treat this. Would like for her to let us know if her symptoms are not improving.  Algis Greenhouse. Jerline Pain, MD 11/30/2021 8:02 AM

## 2022-01-11 ENCOUNTER — Ambulatory Visit (INDEPENDENT_AMBULATORY_CARE_PROVIDER_SITE_OTHER): Payer: Medicare PPO

## 2022-01-11 DIAGNOSIS — I442 Atrioventricular block, complete: Secondary | ICD-10-CM | POA: Diagnosis not present

## 2022-01-15 LAB — CUP PACEART REMOTE DEVICE CHECK
Battery Remaining Longevity: 23 mo
Battery Voltage: 2.94 V
Brady Statistic AP VP Percent: 13.02 %
Brady Statistic AP VS Percent: 0 %
Brady Statistic AS VP Percent: 86.86 %
Brady Statistic AS VS Percent: 0.12 %
Brady Statistic RA Percent Paced: 12.98 %
Brady Statistic RV Percent Paced: 99.74 %
Date Time Interrogation Session: 20230303152233
Implantable Lead Implant Date: 20170906
Implantable Lead Implant Date: 20170906
Implantable Lead Location: 753859
Implantable Lead Location: 753860
Implantable Lead Model: 3830
Implantable Lead Model: 5076
Implantable Pulse Generator Implant Date: 20170906
Lead Channel Impedance Value: 304 Ohm
Lead Channel Impedance Value: 399 Ohm
Lead Channel Impedance Value: 418 Ohm
Lead Channel Impedance Value: 494 Ohm
Lead Channel Pacing Threshold Amplitude: 0.375 V
Lead Channel Pacing Threshold Amplitude: 1.5 V
Lead Channel Pacing Threshold Pulse Width: 0.4 ms
Lead Channel Pacing Threshold Pulse Width: 0.4 ms
Lead Channel Sensing Intrinsic Amplitude: 2.375 mV
Lead Channel Sensing Intrinsic Amplitude: 2.375 mV
Lead Channel Sensing Intrinsic Amplitude: 5.375 mV
Lead Channel Sensing Intrinsic Amplitude: 5.375 mV
Lead Channel Setting Pacing Amplitude: 2 V
Lead Channel Setting Pacing Amplitude: 2.5 V
Lead Channel Setting Pacing Pulse Width: 1 ms
Lead Channel Setting Sensing Sensitivity: 0.9 mV

## 2022-01-18 ENCOUNTER — Telehealth: Payer: Self-pay | Admitting: Family Medicine

## 2022-01-18 NOTE — Progress Notes (Signed)
Remote pacemaker transmission.   

## 2022-01-18 NOTE — Telephone Encounter (Signed)
Attempted to schedule AWV. Unable to LVM.  Will try at later time.  

## 2022-01-23 ENCOUNTER — Other Ambulatory Visit: Payer: Self-pay | Admitting: Family Medicine

## 2022-02-08 ENCOUNTER — Ambulatory Visit: Payer: Medicare PPO | Admitting: Neurology

## 2022-02-12 ENCOUNTER — Encounter: Payer: Self-pay | Admitting: Neurology

## 2022-02-12 ENCOUNTER — Ambulatory Visit: Payer: Medicare PPO | Admitting: Neurology

## 2022-02-12 VITALS — BP 123/78 | HR 80 | Ht 59.0 in | Wt 156.5 lb

## 2022-02-12 DIAGNOSIS — M792 Neuralgia and neuritis, unspecified: Secondary | ICD-10-CM | POA: Diagnosis not present

## 2022-02-12 MED ORDER — GABAPENTIN 100 MG PO CAPS: 100.0000 mg | ORAL_CAPSULE | Freq: Every day | ORAL | 3 refills | Status: DC

## 2022-02-12 NOTE — Progress Notes (Signed)
? ?[facility] ? ?PATIENT: Kylie Patrick ?DOB: 1936-06-19 ? ?REFERRING CLINICIAN: Marin Olp, MD ?HISTORY FROM: Patient  ?REASON FOR VISIT: Bilateral feet numbness  ? ? ?HISTORICAL ? ?CHIEF COMPLAINT:  ?Chief Complaint  ?Patient presents with  ? Follow-up  ?  Rm 12. Alone. ?Pt states she still has neuropathic pain in both feet that has not improved. C/o numbness in arch of foot.  ? ?INTERVAL HISTORY 02/12/2022:  ?Patient presents to follow-up, she said that numbness is still present.  She does report that gabapentin helps but she is not taking it consistently.  She feels like her feet are cold at night.  There are no worsening of her symptoms.  ? ?HISTORY OF PRESENT ILLNESS:  ?This is a 86 year old woman with past medical history of heart disease with pacemaker placement, hypertension, GERD who is presenting with complaint of bilateral feet numbness.  Patient stated the numbness started since last November.  It started initially with the left foot.  She has seen her primary care doctor and at that time she was not started on any medications.  Patient stated that the numbness continued to worsen now involving the entire left foot and is now on the right foot, on top and lateral part of the right foot.  She also complains of right ankle pain.  She said walking barefoot make her feel like she is walking on sponges and that she has new shoes with a good sole and walking is much better now.  She denies any recent falls.  List of medication include gabapentin but she mentioned she only takes it sporadically for headaches.  She has not tried for feet numbness and pain. She does not have a history of diabetes.  ? ? ?OTHER MEDICAL CONDITIONS: CAD with pacemaker, HTN, GERD,  ? ? ?REVIEW OF SYSTEMS: Full 14 system review of systems performed and negative with exception of: as noted in the HPI.  ? ?ALLERGIES: ?Allergies  ?Allergen Reactions  ? Chlorthalidone   ?  01/09/13 creatinine 2.2  ?  Hydrochlorothiazide   ?  Hypokalemia  ? Losartan Potassium-Hctz   ?  Hypokalemia  ? Norvasc [Amlodipine Besylate]   ?  Edema  ? Raloxifene   ?  HTN  ? Rofecoxib   ?  HTN  ? ? ?HOME MEDICATIONS: ?Outpatient Medications Prior to Visit  ?Medication Sig Dispense Refill  ? ALPRAZolam (XANAX) 0.25 MG tablet Take 1 tablet (0.25 mg total) by mouth 2 (two) times daily as needed for anxiety. 30 tablet 2  ? aspirin EC 81 MG tablet Take 81 mg by mouth daily.    ? Calcium in Bone Mineral Cmplx 350 MG MISC Take 3 each by mouth at bedtime. Takes 1 in the morning and 2 at night    ? carvedilol (COREG) 6.25 MG tablet Take 1 tablet (6.25 mg total) by mouth 2 (two) times daily with a meal. 180 tablet 3  ? Cholecalciferol (VITAMIN D3) 2000 UNITS TABS Take 1 tablet by mouth daily with lunch.    ? esomeprazole (NEXIUM) 40 MG capsule TAKE 1 CAPSULE BY MOUTH TWICE A DAY BEFORE A MEAL 180 capsule 3  ? ezetimibe-simvastatin (VYTORIN) 10-20 MG tablet TAKE 1 TABLET BY MOUTH EVERYDAY AT BEDTIME 90 tablet 2  ? famotidine (PEPCID) 40 MG tablet TAKE 1 TABLET BY MOUTH EVERYDAY AT BEDTIME 90 tablet 3  ? folic acid (FOLVITE) 357 MCG tablet Take 400 mcg by mouth every morning.    ? furosemide (LASIX) 40 MG  tablet Take 1 tablet (40 mg total) by mouth daily. 90 tablet 3  ? KLOR-CON M20 20 MEQ tablet TAKE 1 TABLET (20 MEQ TOTAL) BY MOUTH DAILY AS NEEDED. 90 tablet 1  ? losartan (COZAAR) 25 MG tablet Take 1 tablet (25 mg total) by mouth daily. 90 tablet 3  ? Magnesium 400 MG CAPS Take 400 mg by mouth at bedtime.    ? nitrofurantoin, macrocrystal-monohydrate, (MACROBID) 100 MG capsule Take 1 capsule (100 mg total) by mouth 2 (two) times daily. 14 capsule 0  ? Omega-3 Fatty Acids (FISH OIL) 500 MG CAPS Take 500 mg by mouth daily with lunch.    ? gabapentin (NEURONTIN) 100 MG capsule Take 1 capsule (100 mg total) by mouth every 8 (eight) hours as needed (headache). 90 capsule 2  ? ?No facility-administered medications prior to visit.  ? ? ?PAST MEDICAL  HISTORY: ?Past Medical History:  ?Diagnosis Date  ? Anxiety   ? Colon polyp 2003  ? Complication of anesthesia   ? slow to wake up one time  ? Gastroenteritis   ? w/ renal insufficiency in context of protracted nausea & vomitting 2006  ? GERD (gastroesophageal reflux disease)   ? H/O hiatal hernia   ? Headache(784.0)   ? occasional lifelong- gabapentin helps a  ? Hiatal hernia   ? Hyperlipidemia   ? Hypertension   ? w/ LVH on ECHO  ? Pacemaker 07/2016  ? Tonsillitis 2006  ? ? ?PAST SURGICAL HISTORY: ?Past Surgical History:  ?Procedure Laterality Date  ? ABDOMINAL HYSTERECTOMY  1976  ? BSO for endometriosis and bengin tumor  ? APPENDECTOMY  1970  ? BREAST LUMPECTOMY Left 70's  ? X 2  ? BUNIONECTOMY Bilateral   ? CATARACT EXTRACTION W/PHACO Left 05/14/2013  ? Procedure: CATARACT EXTRACTION PHACO AND INTRAOCULAR LENS PLACEMENT (IOC);  Surgeon: Tonny Branch, MD;  Location: [facility];  Service: Ophthalmology;  Laterality: Left;  CDE:9.76  ? CATARACT EXTRACTION W/PHACO Right 06/08/2013  ? Procedure: CATARACT EXTRACTION PHACO AND INTRAOCULAR LENS PLACEMENT (IOC);  Surgeon: Tonny Branch, MD;  Location: [facility];  Service: Ophthalmology;  Laterality: Right;  CDE 13.65  ? CHOLECYSTECTOMY  1970  ? COLONOSCOPY W/ POLYPECTOMY  2003  ? Dr [name]  ? EP IMPLANTABLE DEVICE N/A 07/18/2016  ? Procedure: Pacemaker Implant;  Surgeon: Deboraha Sprang, MD;  Location: [facility];  Service: Cardiovascular;  Laterality: N/A;  ? ESOPHAGEAL MANOMETRY N/A 10/05/2013  ? Procedure: ESOPHAGEAL MANOMETRY (EM);  Surgeon: Gatha Mayer, MD;  Location: [facility];  Service: Endoscopy;  Laterality: N/A;  ? ESOPHAGOGASTRODUODENOSCOPY  02/05/13  ? Large Hiatal Hernia  ? HERNIA REPAIR    ? LAPAROSCOPIC NISSEN FUNDOPLICATION Bilateral 09/38/1829  ? Procedure: LAPAROSCOPIC NISSEN FUNDOPLICATION with hiatal hernia repair ;  Surgeon: Edward Jolly, MD;  Location: [facility];  Service: General;  Laterality: Bilateral;  ? ? ?FAMILY HISTORY: ?Family History   ?Problem Relation Age of Onset  ? Heart attack Father 25  ?     fatal MI @ 9  ? Coronary artery disease Mother   ?     CABG; MI @ 8  ? Diabetes Mother   ? Stroke Paternal Grandfather   ?     > 55  ? Stroke Maternal Grandmother   ?     in 59s  ? Aneurysm Paternal Grandmother   ?     cns ; in 3s  ? Diabetes Maternal Aunt   ? Stomach cancer Maternal Aunt   ?  Breast cancer Maternal Aunt   ? Heart disease Sister   ? Coronary artery disease Maternal Grandfather   ? Colon cancer Neg Hx   ? Esophageal cancer Neg Hx   ? ? ?SOCIAL HISTORY: ?Social History  ? ?Socioeconomic History  ? Marital status: Married  ?  Spouse name: Not on file  ? Number of children: 2  ? Years of education: Not on file  ? Highest education level: Not on file  ?Occupational History  ? Occupation: Retired  ?  Employer: RETIRED  ?Tobacco Use  ? Smoking status: Never  ? Smokeless tobacco: Never  ?Vaping Use  ? Vaping Use: Never used  ?Substance and Sexual Activity  ? Alcohol use: Yes  ?  Comment: Wine-VERY RARELY  ? Drug use: No  ? Sexual activity: Not Currently  ?Other Topics Concern  ? Not on file  ?Social History Narrative  ? Lives alone, husband is in a nursing home  ? Right Handed  ? Drinks caffeine rarely  ? ?Social Determinants of Health  ? ?Financial Resource Strain: Not on file  ?Food Insecurity: Not on file  ?Transportation Needs: Not on file  ?Physical Activity: Not on file  ?Stress: Not on file  ?Social Connections: Not on file  ?Intimate Partner Violence: Not on file  ? ? ? ?PHYSICAL EXAM ? ?GENERAL EXAM/CONSTITUTIONAL: ?Vitals:  ?Vitals:  ? 02/12/22 1148  ?BP: 123/78  ?Pulse: 80  ?Weight: 156 lb 8 oz (71 kg)  ?Height: '4\' 11"'$  (1.499 m)  ? ? ?Body mass index is 31.61 kg/m?. ?Wt Readings from Last 3 Encounters:  ?02/12/22 156 lb 8 oz (71 kg)  ?11/27/21 158 lb (71.7 kg)  ?10/04/21 154 lb 6.4 oz (70 kg)  ? ?Patient is in no distress; well developed, nourished and groomed; neck is supple ? ?EYES: ?Pupils round and reactive to light, Visual  fields full to confrontation, Extraocular movements intacts,  ? ?MUSCULOSKELETAL: ?Gait, strength, tone, movements noted in Neurologic exam below ? ?NEUROLOGIC: ?MENTAL STATUS:  ?   ? View : No data to display.  ?  ?  ?  ? ?aw

## 2022-02-12 NOTE — Patient Instructions (Addendum)
Continue with Gabapentin nightly  ?Can increase as needed (twice daily)  ?Return in a year  ?

## 2022-02-23 ENCOUNTER — Ambulatory Visit: Payer: Medicare PPO | Admitting: Physician Assistant

## 2022-02-23 VITALS — BP 155/74 | HR 102 | Temp 98.3°F | Ht 59.0 in | Wt 157.2 lb

## 2022-02-23 DIAGNOSIS — R051 Acute cough: Secondary | ICD-10-CM | POA: Diagnosis not present

## 2022-02-23 LAB — POC COVID19 BINAXNOW: SARS Coronavirus 2 Ag: NEGATIVE

## 2022-02-23 LAB — POCT INFLUENZA A/B
Influenza A, POC: NEGATIVE
Influenza B, POC: NEGATIVE

## 2022-02-23 MED ORDER — BENZONATATE 100 MG PO CAPS: 100.0000 mg | ORAL_CAPSULE | Freq: Three times a day (TID) | ORAL | 0 refills | Status: DC | PRN

## 2022-02-23 MED ORDER — FLUTICASONE PROPIONATE HFA 44 MCG/ACT IN AERO: 2.0000 | INHALATION_SPRAY | Freq: Two times a day (BID) | RESPIRATORY_TRACT | 0 refills | Status: DC

## 2022-02-23 MED ORDER — HYDROCOD POLI-CHLORPHE POLI ER 10-8 MG/5ML PO SUER
5.0000 mL | Freq: Two times a day (BID) | ORAL | 0 refills | Status: DC
Start: 2022-02-23 — End: 2022-03-14

## 2022-02-23 NOTE — Patient Instructions (Signed)
Take medications as directed. Do not drive with cough syrup.  ?

## 2022-02-23 NOTE — Progress Notes (Signed)
? ?Subjective:  ? ? Patient ID: Kylie Patrick, female    DOB: 05-14-1936, 86 y.o.   MRN: [id number] ? ?Chief Complaint  ?Patient presents with  ? Sore Throat  ?  Symptoms started Monday with a sore throat, then the stopped up head and the coughing came. Clear mucous and very slimmy. Taking Tylenol. Helped a little bite until it wears off.  ? ? ?Sore Throat  ? ?Patient is in today for the following: ? ?Chief complaint: Cough, ST ?Symptom onset: 4 days ago ?Pertinent positives: Coughed all night, some SOB, headache, some facial pain yesterday ?Pertinent negatives: Fever, chills, body aches, n/v/d/abd pain ?Treatments tried: Cough drops not helping ?Sick exposure: No recent sick contacts or recent travel ?Hx of bronchitis once in the last 20 years. No hx of lung issues. No hx of smoking.  ? ? ?Past Medical History:  ?Diagnosis Date  ? Anxiety   ? Colon polyp 2003  ? Complication of anesthesia   ? slow to wake up one time  ? Gastroenteritis   ? w/ renal insufficiency in context of protracted nausea & vomitting 2006  ? GERD (gastroesophageal reflux disease)   ? H/O hiatal hernia   ? Headache(784.0)   ? occasional lifelong- gabapentin helps a  ? Hiatal hernia   ? Hyperlipidemia   ? Hypertension   ? w/ LVH on ECHO  ? Pacemaker 07/2016  ? Tonsillitis 2006  ? ? ?Past Surgical History:  ?Procedure Laterality Date  ? ABDOMINAL HYSTERECTOMY  1976  ? BSO for endometriosis and bengin tumor  ? APPENDECTOMY  1970  ? BREAST LUMPECTOMY Left 70's  ? X 2  ? BUNIONECTOMY Bilateral   ? CATARACT EXTRACTION W/PHACO Left 05/14/2013  ? Procedure: CATARACT EXTRACTION PHACO AND INTRAOCULAR LENS PLACEMENT (IOC);  Surgeon: Tonny Branch, MD;  Location: [facility];  Service: Ophthalmology;  Laterality: Left;  CDE:9.76  ? CATARACT EXTRACTION W/PHACO Right 06/08/2013  ? Procedure: CATARACT EXTRACTION PHACO AND INTRAOCULAR LENS PLACEMENT (IOC);  Surgeon: Tonny Branch, MD;  Location: [facility];  Service: Ophthalmology;  Laterality: Right;  CDE 13.65  ?  CHOLECYSTECTOMY  1970  ? COLONOSCOPY W/ POLYPECTOMY  2003  ? Dr [name]  ? EP IMPLANTABLE DEVICE N/A 07/18/2016  ? Procedure: Pacemaker Implant;  Surgeon: Deboraha Sprang, MD;  Location: [facility];  Service: Cardiovascular;  Laterality: N/A;  ? ESOPHAGEAL MANOMETRY N/A 10/05/2013  ? Procedure: ESOPHAGEAL MANOMETRY (EM);  Surgeon: Gatha Mayer, MD;  [facility];  Service: Endoscopy;  Laterality: N/A;  ? ESOPHAGOGASTRODUODENOSCOPY  02/05/13  ? Large Hiatal Hernia  ? HERNIA REPAIR    ? LAPAROSCOPIC NISSEN FUNDOPLICATION Bilateral 40/98/1191  ? Procedure: LAPAROSCOPIC NISSEN FUNDOPLICATION with hiatal hernia repair ;  Surgeon: Edward Jolly, MD;  Location: [facility];  Service: General;  Laterality: Bilateral;  ? ? ?Family History  ?Problem Relation Age of Onset  ? Heart attack Father 86  ?     fatal MI @ 56  ? Coronary artery disease Mother   ?     CABG; MI @ 39  ? Diabetes Mother   ? Stroke Paternal Grandfather   ?     > 55  ? Stroke Maternal Grandmother   ?     in 1s  ? Aneurysm Paternal Grandmother   ?     cns ; in 41s  ? Diabetes Maternal Aunt   ? Stomach cancer Maternal Aunt   ? Breast cancer Maternal Aunt   ? Heart  disease Sister   ? Coronary artery disease Maternal Grandfather   ? Colon cancer Neg Hx   ? Esophageal cancer Neg Hx   ? ? ?Social History  ? ?Tobacco Use  ? Smoking status: Never  ? Smokeless tobacco: Never  ?Vaping Use  ? Vaping Use: Never used  ?Substance Use Topics  ? Alcohol use: Yes  ?  Comment: Wine-VERY RARELY  ? Drug use: No  ?  ? ?Allergies  ?Allergen Reactions  ? Chlorthalidone   ?  01/09/13 creatinine 2.2  ? Hydrochlorothiazide   ?  Hypokalemia  ? Losartan Potassium-Hctz   ?  Hypokalemia  ? Norvasc [Amlodipine Besylate]   ?  Edema  ? Raloxifene   ?  HTN  ? Rofecoxib   ?  HTN  ? ? ?Review of Systems ?NEGATIVE UNLESS OTHERWISE INDICATED IN HPI ? ? ?   ?Objective:  ?  ? ?BP (!) 155/74   Pulse (!) 102   Temp 98.3 ?F (36.8 ?C)   Ht '4\' 11"'$  (1.499 m)   Wt 157 lb 3.2 oz  (71.3 kg)   SpO2 99%   BMI 31.75 kg/m?  ? ?Wt Readings from Last 3 Encounters:  ?02/23/22 157 lb 3.2 oz (71.3 kg)  ?02/12/22 156 lb 8 oz (71 kg)  ?11/27/21 158 lb (71.7 kg)  ? ? ?BP Readings from Last 3 Encounters:  ?02/23/22 (!) 155/74  ?02/12/22 123/78  ?11/27/21 (!) 149/68  ?  ? ?Physical Exam ?Vitals and nursing note reviewed.  ?Constitutional:   ?   Appearance: Normal appearance. She is normal weight. She is not toxic-appearing.  ?HENT:  ?   Head: Normocephalic and atraumatic.  ?   Right Ear: Tympanic membrane, ear canal and external ear normal.  ?   Left Ear: Tympanic membrane, ear canal and external ear normal.  ?   Nose: Nose normal.  ?   Mouth/Throat:  ?   Mouth: Mucous membranes are moist.  ?Eyes:  ?   Extraocular Movements: Extraocular movements intact.  ?   Conjunctiva/sclera: Conjunctivae normal.  ?   Pupils: Pupils are equal, round, and reactive to light.  ?Cardiovascular:  ?   Rate and Rhythm: Normal rate and regular rhythm.  ?   Pulses: Normal pulses.  ?   Heart sounds: Normal heart sounds.  ?Pulmonary:  ?   Effort: Pulmonary effort is normal.  ?   Breath sounds: Normal breath sounds.  ?   Comments: Paroxysmal coughing ?Abdominal:  ?   General: Abdomen is flat.  ?Musculoskeletal:     ?   General: Normal range of motion.  ?   Cervical back: Normal range of motion and neck supple.  ?Skin: ?   General: Skin is warm and dry.  ?Neurological:  ?   Mental Status: She is alert.  ?Psychiatric:     ?   Mood and Affect: Mood normal.     ?   Behavior: Behavior normal.     ?   Thought Content: Thought content normal.     ?   Judgment: Judgment normal.  ? ? ?   ?Assessment & Plan:  ? ?Problem List Items Addressed This Visit   ?None ?Visit Diagnoses   ? ? Acute cough    -  Primary  ? Relevant Orders  ? POCT Influenza A/B (Completed)  ? POC COVID-19 (Completed)  ? ?  ? ? ? ?Meds ordered this encounter  ?Medications  ? benzonatate (TESSALON PERLES) 100 MG capsule  ?  Sig: Take 1 capsule (100 mg total) by mouth 3  (three) times daily as needed for cough.  ?  Dispense:  30 capsule  ?  Refill:  0  ? chlorpheniramine-HYDROcodone (TUSSIONEX PENNKINETIC ER) 10-8 MG/5ML  ?  Sig: Take 5 mLs by mouth 2 (two) times daily.  ?  Dispense:  100 mL  ?  Refill:  0  ? fluticasone (FLOVENT HFA) 44 MCG/ACT inhaler  ?  Sig: Inhale 2 puffs into the lungs 2 (two) times daily. Rinse mouth after use.  ?  Dispense:  1 each  ?  Refill:  0  ? ?1. Acute cough ?Reassured patient that point-of-care COVID and flu test were negative in office today.  Viral or allergen etiology of persistent coughing.  Tessalon Perles, Tussionex, Flovent to use as directed for help.  Push fluids and rest.  Tea with honey.  Recheck if worse or no improvement. ? ? ?This note was prepared with assistance of Systems analyst. Occasional wrong-word or sound-a-like substitutions may have occurred due to the inherent limitations of voice recognition software. ? ? ? ?Boluwatife Mutchler M Weyman Bogdon, PA-C ?

## 2022-02-26 ENCOUNTER — Ambulatory Visit: Payer: Medicare PPO | Admitting: Family Medicine

## 2022-02-26 ENCOUNTER — Encounter: Payer: Self-pay | Admitting: Family Medicine

## 2022-02-26 VITALS — BP 120/70 | HR 87 | Temp 98.7°F | Ht 59.0 in | Wt 156.1 lb

## 2022-02-26 DIAGNOSIS — J029 Acute pharyngitis, unspecified: Secondary | ICD-10-CM | POA: Diagnosis not present

## 2022-02-26 DIAGNOSIS — B309 Viral conjunctivitis, unspecified: Secondary | ICD-10-CM | POA: Diagnosis not present

## 2022-02-26 MED ORDER — AMOXICILLIN 875 MG PO TABS: 875.0000 mg | ORAL_TABLET | Freq: Two times a day (BID) | ORAL | 0 refills | Status: DC

## 2022-02-26 MED ORDER — CIPROFLOXACIN HCL 0.3 % OP SOLN
1.0000 [drp] | OPHTHALMIC | 0 refills | Status: DC
Start: 2022-02-26 — End: 2022-03-14

## 2022-02-26 NOTE — Progress Notes (Signed)
? ?Subjective:  ? ? ? Patient ID: Kylie Patrick, female    DOB: 02-04-1936, 86 y.o.   MRN: [id number] ? ?Chief Complaint  ?Patient presents with  ? Sore Throat  ?  Hard to swallow, started Monday, was previously prescribed medicine, not feeling better after 3 days of medications  ? Eye Burn  ?  Eye burning and sticking together that started Sunday  ? ? ?HPI ? Sore throat for 1 wks-seen on 4/14-got cough meds.  Coughing a lot-but some better w/cough meds.  Phlegm thick and yellow. Temp 98.2 since 4/14.  Hurts worse after eating and hard to swallow pills ?Since yesterday, eyes burning ang sticking together. ? ?Health Maintenance Due  ?Topic Date Due  ? COVID-19 Vaccine (4 - Booster for [place] series) 10/13/2020  ? ? ?Past Medical History:  ?Diagnosis Date  ? Anxiety   ? Colon polyp 2003  ? Complication of anesthesia   ? slow to wake up one time  ? Gastroenteritis   ? w/ renal insufficiency in context of protracted nausea & vomitting 2006  ? GERD (gastroesophageal reflux disease)   ? H/O hiatal hernia   ? Headache(784.0)   ? occasional lifelong- gabapentin helps a  ? Hiatal hernia   ? Hyperlipidemia   ? Hypertension   ? w/ LVH on ECHO  ? Pacemaker 07/2016  ? Tonsillitis 2006  ? ? ?Past Surgical History:  ?Procedure Laterality Date  ? ABDOMINAL HYSTERECTOMY  1976  ? BSO for endometriosis and bengin tumor  ? APPENDECTOMY  1970  ? BREAST LUMPECTOMY Left 70's  ? X 2  ? BUNIONECTOMY Bilateral   ? CATARACT EXTRACTION W/PHACO Left 05/14/2013  ? Procedure: CATARACT EXTRACTION PHACO AND INTRAOCULAR LENS PLACEMENT (IOC);  Surgeon: Tonny Branch, MD;  Location: [facility];  Service: Ophthalmology;  Laterality: Left;  CDE:9.76  ? CATARACT EXTRACTION W/PHACO Right 06/08/2013  ? Procedure: CATARACT EXTRACTION PHACO AND INTRAOCULAR LENS PLACEMENT (IOC);  Surgeon: Tonny Branch, MD;  Location: [facility];  Service: Ophthalmology;  Laterality: Right;  CDE 13.65  ? CHOLECYSTECTOMY  1970  ? COLONOSCOPY W/ POLYPECTOMY  2003  ? Dr [name]  ? EP IMPLANTABLE  DEVICE N/A 07/18/2016  ? Procedure: Pacemaker Implant;  Surgeon: Deboraha Sprang, MD;  Location: [facility];  Service: Cardiovascular;  Laterality: N/A;  ? ESOPHAGEAL MANOMETRY N/A 10/05/2013  ? Procedure: ESOPHAGEAL MANOMETRY (EM);  Surgeon: Gatha Mayer, MD;  Location: [facility];  Service: Endoscopy;  Laterality: N/A;  ? ESOPHAGOGASTRODUODENOSCOPY  02/05/13  ? Large Hiatal Hernia  ? HERNIA REPAIR    ? LAPAROSCOPIC NISSEN FUNDOPLICATION Bilateral 27/51/7001  ? Procedure: LAPAROSCOPIC NISSEN FUNDOPLICATION with hiatal hernia repair ;  Surgeon: Edward Jolly, MD;  Location: [facility];  Service: General;  Laterality: Bilateral;  ? ? ?Outpatient Medications Prior to Visit  ?Medication Sig Dispense Refill  ? ALPRAZolam (XANAX) 0.25 MG tablet Take 1 tablet (0.25 mg total) by mouth 2 (two) times daily as needed for anxiety. 30 tablet 2  ? aspirin EC 81 MG tablet Take 81 mg by mouth daily.    ? benzonatate (TESSALON PERLES) 100 MG capsule Take 1 capsule (100 mg total) by mouth 3 (three) times daily as needed for cough. 30 capsule 0  ? Calcium in Bone Mineral Cmplx 350 MG MISC Take 3 each by mouth at bedtime. Takes 1 in the morning and 2 at night    ? carvedilol (COREG) 6.25 MG tablet Take 1 tablet (6.25 mg total) by mouth  2 (two) times daily with a meal. 180 tablet 3  ? chlorpheniramine-HYDROcodone (TUSSIONEX PENNKINETIC ER) 10-8 MG/5ML Take 5 mLs by mouth 2 (two) times daily. 100 mL 0  ? Cholecalciferol (VITAMIN D3) 2000 UNITS TABS Take 1 tablet by mouth daily with lunch.    ? esomeprazole (NEXIUM) 40 MG capsule TAKE 1 CAPSULE BY MOUTH TWICE A DAY BEFORE A MEAL 180 capsule 3  ? ezetimibe-simvastatin (VYTORIN) 10-20 MG tablet TAKE 1 TABLET BY MOUTH EVERYDAY AT BEDTIME 90 tablet 2  ? famotidine (PEPCID) 40 MG tablet TAKE 1 TABLET BY MOUTH EVERYDAY AT BEDTIME 90 tablet 3  ? fluticasone (FLOVENT HFA) 44 MCG/ACT inhaler Inhale 2 puffs into the lungs 2 (two) times daily. Rinse mouth after use. 1 each 0  ? folic acid  (FOLVITE) 681 MCG tablet Take 400 mcg by mouth every morning.    ? furosemide (LASIX) 40 MG tablet Take 1 tablet (40 mg total) by mouth daily. 90 tablet 3  ? gabapentin (NEURONTIN) 100 MG capsule Take 1 capsule (100 mg total) by mouth at bedtime. 90 capsule 3  ? KLOR-CON M20 20 MEQ tablet TAKE 1 TABLET (20 MEQ TOTAL) BY MOUTH DAILY AS NEEDED. 90 tablet 1  ? losartan (COZAAR) 25 MG tablet Take 1 tablet (25 mg total) by mouth daily. 90 tablet 3  ? Magnesium 400 MG CAPS Take 400 mg by mouth at bedtime.    ? nitrofurantoin, macrocrystal-monohydrate, (MACROBID) 100 MG capsule Take 1 capsule (100 mg total) by mouth 2 (two) times daily. 14 capsule 0  ? Omega-3 Fatty Acids (FISH OIL) 500 MG CAPS Take 500 mg by mouth daily with lunch.    ? ?No facility-administered medications prior to visit.  ? ? ?Allergies  ?Allergen Reactions  ? Chlorthalidone   ?  01/09/13 creatinine 2.2  ? Hydrochlorothiazide   ?  Hypokalemia  ? Losartan Potassium-Hctz   ?  Hypokalemia  ? Norvasc [Amlodipine Besylate]   ?  Edema  ? Raloxifene   ?  HTN  ? Rofecoxib   ?  HTN  ? ?ROS neg/noncontributory except as noted HPI/below ? ? ?   ?Objective:  ?  ? ?BP 120/70   Pulse 87   Temp 98.7 ?F (37.1 ?C) (Temporal)   Ht '4\' 11"'$  (1.499 m)   Wt 156 lb 2 oz (70.8 kg)   SpO2 94%   BMI 31.53 kg/m?  ?Wt Readings from Last 3 Encounters:  ?02/26/22 156 lb 2 oz (70.8 kg)  ?02/23/22 157 lb 3.2 oz (71.3 kg)  ?02/12/22 156 lb 8 oz (71 kg)  ? ? ?Physical Exam  ? ?Gen: [facility] NAD ?HEENT: NCAT, conjunctiva + injected B, sclera nonicteric ?TM WNL B, OP moist, no exudates .  congested ?NECK:  supple, no thyromegaly, no nodes, no carotid bruits ?CARDIAC: RRR, S1S2+,  ?LUNGS: CTAB. No wheezes ?EXT:  no edema ?MSK: no gross abnormalities.  ?NEURO: A&O x3.  CN II-XII intact.  ?PSYCH: normal mood. Good eye contact ? ?   ?Assessment & Plan:  ? ?Problem List Items Addressed This Visit   ?None ?Visit Diagnoses   ? ? Pharyngitis, unspecified etiology    -  Primary  ? Acute viral  conjunctivitis of both eyes      ? ?  ? Pharyngitis/conjunctivitis-prob viral, but throat not improving.  Will do abx d/t age/co-morbidities.   ? ? ?Meds ordered this encounter  ?Medications  ? amoxicillin (AMOXIL) 875 MG tablet  ?  Sig: Take 1 tablet (875 mg total)  by mouth 2 (two) times daily.  ?  Dispense:  14 tablet  ?  Refill:  0  ? ciprofloxacin (CILOXAN) 0.3 % ophthalmic solution  ?  Sig: Place 1 drop into both eyes every 4 (four) hours while awake.  ?  Dispense:  5 mL  ?  Refill:  0  ? ? ?Wellington Hampshire, MD ? ?

## 2022-02-26 NOTE — Patient Instructions (Signed)
Meds have been sent the the pharmacy ?You can take tylenol for pain/fevers ?If worsening symptoms, let us know or go to the Emergency room  ? ?Do warm compresses to eyes ?

## 2022-03-06 ENCOUNTER — Other Ambulatory Visit: Payer: Self-pay | Admitting: Internal Medicine

## 2022-03-12 ENCOUNTER — Telehealth: Payer: Self-pay | Admitting: Family Medicine

## 2022-03-12 NOTE — Telephone Encounter (Signed)
I could do 11 40 this Wednesday though likely to be running behind and would just want to make sure we have coverage ?

## 2022-03-12 NOTE — Telephone Encounter (Signed)
Ok to work pt in sooner with you or is 5/15 ok? ?

## 2022-03-12 NOTE — Telephone Encounter (Signed)
Sorry 5/25 * ?

## 2022-03-12 NOTE — Telephone Encounter (Signed)
Pt states she has not been well since she had bronchitis. She has been in 2x since then with other providers but she is not feeling well. She will not come in to see another provider. Please advise ?

## 2022-03-13 NOTE — Telephone Encounter (Signed)
See below

## 2022-03-14 ENCOUNTER — Ambulatory Visit: Payer: Medicare PPO | Admitting: Family Medicine

## 2022-03-14 ENCOUNTER — Encounter: Payer: Self-pay | Admitting: Family Medicine

## 2022-03-14 VITALS — BP 100/80 | HR 74 | Temp 98.0°F | Ht 59.0 in | Wt 151.6 lb

## 2022-03-14 DIAGNOSIS — I442 Atrioventricular block, complete: Secondary | ICD-10-CM

## 2022-03-14 DIAGNOSIS — I700 Atherosclerosis of aorta: Secondary | ICD-10-CM

## 2022-03-14 DIAGNOSIS — Z79899 Other long term (current) drug therapy: Secondary | ICD-10-CM

## 2022-03-14 DIAGNOSIS — R739 Hyperglycemia, unspecified: Secondary | ICD-10-CM | POA: Diagnosis not present

## 2022-03-14 DIAGNOSIS — E782 Mixed hyperlipidemia: Secondary | ICD-10-CM

## 2022-03-14 DIAGNOSIS — I10 Essential (primary) hypertension: Secondary | ICD-10-CM

## 2022-03-14 LAB — COMPREHENSIVE METABOLIC PANEL
ALT: 20 U/L (ref 0–35)
AST: 30 U/L (ref 0–37)
Albumin: 4.3 g/dL (ref 3.5–5.2)
Alkaline Phosphatase: 55 U/L (ref 39–117)
BUN: 16 mg/dL (ref 6–23)
CO2: 29 mEq/L (ref 19–32)
Calcium: 10.2 mg/dL (ref 8.4–10.5)
Chloride: 100 mEq/L (ref 96–112)
Creatinine, Ser: 1.5 mg/dL — ABNORMAL HIGH (ref 0.40–1.20)
GFR: 31.96 mL/min — ABNORMAL LOW (ref >60.00)
Glucose, Bld: 105 mg/dL — ABNORMAL HIGH (ref 70–99)
Potassium: 3.6 mEq/L (ref 3.5–5.1)
Sodium: 140 mEq/L (ref 135–145)
Total Bilirubin: 0.8 mg/dL (ref 0.2–1.2)
Total Protein: 7.4 g/dL (ref 6.0–8.3)

## 2022-03-14 LAB — CBC WITH DIFFERENTIAL/PLATELET
Basophils Absolute: 0.1 10*3/uL (ref 0.0–0.1)
Basophils Relative: 0.9 % (ref 0.0–3.0)
Eosinophils Absolute: 0.2 10*3/uL (ref 0.0–0.7)
Eosinophils Relative: 2.4 % (ref 0.0–5.0)
HCT: 42.5 % (ref 36.0–46.0)
Hemoglobin: 13.7 g/dL (ref 12.0–15.0)
Lymphocytes Relative: 26.5 % (ref 12.0–46.0)
Lymphs Abs: 1.8 10*3/uL (ref 0.7–4.0)
MCHC: 32.4 g/dL (ref 30.0–36.0)
MCV: 88.6 fl (ref 78.0–100.0)
Monocytes Absolute: 0.7 10*3/uL (ref 0.1–1.0)
Monocytes Relative: 9.6 % (ref 3.0–12.0)
Neutro Abs: 4.2 10*3/uL (ref 1.4–7.7)
Neutrophils Relative %: 60.6 % (ref 43.0–77.0)
Platelets: 279 10*3/uL (ref 150.0–400.0)
RBC: 4.79 Mil/uL (ref 3.87–5.11)
RDW: 15.3 % (ref 11.5–15.5)
WBC: 7 10*3/uL (ref 4.0–10.5)

## 2022-03-14 LAB — HEMOGLOBIN A1C: Hgb A1c MFr Bld: 5.8 % (ref 4.6–6.5)

## 2022-03-14 LAB — LIPID PANEL
Cholesterol: 169 mg/dL (ref 0–200)
HDL: 49.5 mg/dL (ref >39.00)
LDL Cholesterol: 91 mg/dL (ref 0–99)
NonHDL: 119.54
Total CHOL/HDL Ratio: 3
Triglycerides: 144 mg/dL (ref 0.0–149.0)
VLDL: 28.8 mg/dL (ref 0.0–40.0)

## 2022-03-14 LAB — VITAMIN B12: Vitamin B-12: 869 pg/mL (ref 211–911)

## 2022-03-14 LAB — TSH: TSH: 1.83 u[IU]/mL (ref 0.35–5.50)

## 2022-03-14 NOTE — Telephone Encounter (Signed)
Pt was scheduled

## 2022-03-14 NOTE — Progress Notes (Signed)
?Phone [phone] ?In person visit ?  ?Subjective:  ? ?Kylie Patrick is a 86 y.o. year old very pleasant female patient who presents for/with See problem oriented charting ?Chief Complaint  ?Patient presents with  ? Follow-up  ?  Pt states she is still not feeling well from having bronchitis a few weeks ago.   ? Weakness  ?  Pt states she can't get her strength back since having bronchitis. She Is feeling weak today and c/o fatigue.  ? ? ?Past Medical History-  ?Patient Active Problem List  ? Diagnosis Date Noted  ? Pacemaker - MDT 08/11/2020  ?  Priority: High  ? Complete heart block ([place]) 04/05/2020  ?  Priority: High  ? Aortic atherosclerosis ([place]) 04/26/2020  ?  Priority: Medium   ? Osteoporosis 04/25/2020  ?  Priority: Medium   ? Carpal tunnel syndrome, right 05/02/2019  ?  Priority: Medium   ? Diastolic dysfunction 26/71/2458  ?  Priority: Medium   ? Hyperglycemia 10/09/2014  ?  Priority: Medium   ? GERD (gastroesophageal reflux disease) 10/22/2013  ?  Priority: Medium   ? CKD (chronic kidney disease) stage 3, GFR 30-59 ml/min (HCC) 05/05/2008  ?  Priority: Medium   ? Anxiety state 04/20/2008  ?  Priority: Medium   ? HYPERLIPIDEMIA 08/12/2007  ?  Priority: Medium   ? Essential hypertension 08/12/2007  ?  Priority: Medium   ? Rash 07/04/2017  ?  Priority: Low  ? Hiatal hernia 08/03/2013  ?  Priority: Low  ? Personal history of colonic polyps 01/09/2013  ?  Priority: Low  ? NIGHT SWEATS 02/19/2008  ?  Priority: Low  ? ? ?Medications- reviewed and updated ?Current Outpatient Medications  ?Medication Sig Dispense Refill  ? ALPRAZolam (XANAX) 0.25 MG tablet Take 1 tablet (0.25 mg total) by mouth 2 (two) times daily as needed for anxiety. 30 tablet 2  ? aspirin EC 81 MG tablet Take 81 mg by mouth daily.    ? Calcium in Bone Mineral Cmplx 350 MG MISC Take 3 each by mouth at bedtime. Takes 1 in the morning and 2 at night    ? carvedilol (COREG) 6.25 MG tablet Take 1 tablet (6.25 mg total) by mouth 2 (two) times  daily with a meal. 180 tablet 3  ? Cholecalciferol (VITAMIN D3) 2000 UNITS TABS Take 1 tablet by mouth daily with lunch.    ? esomeprazole (NEXIUM) 40 MG capsule TAKE 1 CAPSULE BY MOUTH TWICE A DAY BEFORE A MEAL 180 capsule 0  ? ezetimibe-simvastatin (VYTORIN) 10-20 MG tablet TAKE 1 TABLET BY MOUTH EVERYDAY AT BEDTIME 90 tablet 2  ? famotidine (PEPCID) 40 MG tablet TAKE 1 TABLET BY MOUTH EVERYDAY AT BEDTIME 90 tablet 3  ? fluticasone (FLOVENT HFA) 44 MCG/ACT inhaler Inhale 2 puffs into the lungs 2 (two) times daily. Rinse mouth after use. 1 each 0  ? folic acid (FOLVITE) 099 MCG tablet Take 400 mcg by mouth every morning.    ? furosemide (LASIX) 40 MG tablet Take 1 tablet (40 mg total) by mouth daily. 90 tablet 3  ? gabapentin (NEURONTIN) 100 MG capsule Take 1 capsule (100 mg total) by mouth at bedtime. 90 capsule 3  ? KLOR-CON M20 20 MEQ tablet TAKE 1 TABLET (20 MEQ TOTAL) BY MOUTH DAILY AS NEEDED. 90 tablet 1  ? losartan (COZAAR) 25 MG tablet Take 1 tablet (25 mg total) by mouth daily. 90 tablet 3  ? Magnesium 400 MG CAPS Take 400 mg by  mouth at bedtime.    ? Omega-3 Fatty Acids (FISH OIL) 500 MG CAPS Take 500 mg by mouth daily with lunch.    ? ?No current facility-administered medications for this visit.  ? ?  ?Objective:  ?BP 100/80   Pulse 74   Temp 98 ?F (36.7 ?C)   Ht '4\' 11"'$  (1.499 m)   Wt 151 lb 9.6 oz (68.8 kg)   SpO2 97%   BMI 30.62 kg/m?  ?Gen: NAD, resting comfortably, appears emotionally fatigued when discussing husband's recent illness ?CV: RRR no murmurs rubs or gallops ?Lungs: CTAB no crackles, wheeze, rhonchi ?Abdomen: soft/nontender/nondistended/normal bowel sounds. No rebound or guarding.  ?Ext: no edema ?Skin: warm, dry ? ?  ? ?Assessment and Plan  ? ?# Fatigue/weakness  ?S: Patient was seen by Theresa Duty, PA on 02/23/2022-presented with several days of cough/sore throat and then noted head congestion.  Flu and COVID test were negative at that time-thought to be likely viral or allergy  related.  Was given a trial of Tessalon, Tussidex, moment and encouraged to increase fluid intake and rest. ? ?She followed up on 02/26/2022 noting thick and yellow phlegm, worsening sore throat and some eye burning and sticking together-diagnosed as pharyngitis/conjunctivitis probably viral but due to age and comorbidities was treated with amoxicillin and 75 mg for 7 days as well as ciprofloxacin eyedrops. ? ?Today patient reports fluctuating symptoms- some days are better and some days are worse. On bad days- feels tired, weak. Cough and sore throat are better. Phlegm and nasal discharge largely gone. She reports had to stop the drops after a few days- felt grainer with them and stopped- ended up resolving on its own. She self describes as bronchitis. Mild shortness of breath ?-also had to care for husband when he was in hospital- had to go home and rest. Had a lot of stress from that. She feels like overall is improving but gradual.  ?A/P: From AVS "  ?Patient Instructions  ?I think the combination of your illness as well as stress of husband being in the hospital has caused you to feel rundown but I want to be on the safe side and check some blood work today-this blood will take the place of upcoming physical blood work ?... ?Recommended follow up: Return for next already scheduled visit or sooner if needed. If you have new or worsening symptoms see me sooner- glad your cough/sore throat/etc is so much better. If feeling winded worsens- we would also get a chest x-ray but you wanted to hold at this time "  ?-Has history of complete heart block but pacemaker in place and heart rate in acceptable range-appears stable-continue current treatment with carvedilol 6.25 mg twice daily and pacemaker ? ?#hypertension ?S: medication: Losartan 25 mg, Lasix 40 mg, carvedilol 6.25 mg twice daily ?Home readings #s: usually 110s or so but hasnt taken in a few days ?BP Readings from Last 3 Encounters:  ?03/14/22 100/80  ?02/26/22  120/70  ?02/23/22 (!) 155/74  ?A/P: Blood pressure appears to be running lower after recent illness-Home readings have been slightly higher-and at first visit during illness blood pressure was elevated up to 155-ultimately we decided to continue current medications for now but recheck at physical in a few weeks ? ?#hyperlipidemia/aortic atherosclerosis ?S: Medication: Vytorin 10-20 mg daily ?Lab Results  ?Component Value Date  ? CHOL 146 06/19/2021  ? HDL 43.50 06/19/2021  ? [place] 74 06/19/2021  ? TRIG 141.0 06/19/2021  ? CHOLHDL 3 06/19/2021  ?  A/P: Very close to ideal goal at last check-update lipid panel with labs today.  Ideally with aortic atherosclerosis LDL would be under 70-on the other hand with prediabetes prefer not to strengthen statin ? ?# Hyperglycemia/insulin resistance/prediabetes ?S:  Medication: None ?Lab Results  ?Component Value Date  ? HGBA1C 5.8 10/02/2021  ? HGBA1C 5.8 03/31/2021  ? HGBA1C 5.7 (H) 10/10/2020  ? A/P: I doubt patient has developed diabetes and that is the cause of her fatigue but we will update A1c ? ? ?Recommended follow up: Return for next already scheduled visit or sooner if needed. ?Future Appointments  ?Date Time Provider [place]  ?04/05/2022 11:00 AM Marin Olp, MD [facility]  ?04/12/2022  8:40 AM [facility] DEVICE REMOTES [facility]  ?07/12/2022  8:40 AM [facility] DEVICE REMOTES [facility]  ?10/11/2022  8:40 AM [facility] DEVICE REMOTES [facility]  ?01/10/2023  8:40 AM [facility] DEVICE REMOTES [facility]  ?02/18/2023  9:15 AM Alric Ran, MD [facility] None  ?04/11/2023  8:40 AM [facility] DEVICE REMOTES [facility]  ? ? ?Lab/Order associations: Fasting ?  ICD-10-CM   ?1. Essential hypertension  I10   ?  ?2. Hyperglycemia  R73.9 HgB A1c  ?  ?3. HYPERLIPIDEMIA  E78.2 CBC with Differential/Platelet  ?  Comprehensive metabolic panel  ?  TSH  ?  Lipid panel  ?  ?4. High risk medication  use  Z79.899 Vitamin B12  ?  ? ? ?No orders of the defined types were placed in this encounter. ? ? ?Return precautions advised.  ?Garret Reddish, MD ? ?

## 2022-03-14 NOTE — Patient Instructions (Addendum)
I think the combination of your illness as well as stress of husband being in the hospital has caused you to feel rundown but I want to be on the safe side and check some blood work today-this blood will take the place of upcoming physical blood work ? ? Please stop by lab before you go ?If you have mychart- we will send your results within 3 business days of [place] receiving them.  ?If you do not have mychart- we will call you about results within 5 business days of [place] receiving them.  ?*please also note that you will see labs on mychart as soon as they post. I will later go in and write notes on them- will say "notes from Dr. Yong Channel"  ? ?Recommended follow up: Return for next already scheduled visit or sooner if needed. If you have new or worsening symptoms see me sooner- glad your cough/sore throat/etc is so much better. If feeling winded worsens- we would also get a chest x-ray but you wanted to hold at this time ?

## 2022-03-22 ENCOUNTER — Other Ambulatory Visit: Payer: Self-pay | Admitting: Physician Assistant

## 2022-03-29 ENCOUNTER — Other Ambulatory Visit: Payer: Self-pay | Admitting: Family Medicine

## 2022-04-05 ENCOUNTER — Encounter: Payer: Self-pay | Admitting: Family Medicine

## 2022-04-05 ENCOUNTER — Ambulatory Visit (INDEPENDENT_AMBULATORY_CARE_PROVIDER_SITE_OTHER): Payer: Medicare PPO | Admitting: Family Medicine

## 2022-04-05 VITALS — BP 120/80 | HR 76 | Temp 98.2°F | Ht 59.0 in | Wt 155.8 lb

## 2022-04-05 DIAGNOSIS — R739 Hyperglycemia, unspecified: Secondary | ICD-10-CM | POA: Diagnosis not present

## 2022-04-05 DIAGNOSIS — Z0000 Encounter for general adult medical examination without abnormal findings: Secondary | ICD-10-CM | POA: Diagnosis not present

## 2022-04-05 DIAGNOSIS — I10 Essential (primary) hypertension: Secondary | ICD-10-CM

## 2022-04-05 DIAGNOSIS — N183 Chronic kidney disease, stage 3 unspecified: Secondary | ICD-10-CM

## 2022-04-05 DIAGNOSIS — I700 Atherosclerosis of aorta: Secondary | ICD-10-CM | POA: Diagnosis not present

## 2022-04-05 DIAGNOSIS — E782 Mixed hyperlipidemia: Secondary | ICD-10-CM | POA: Diagnosis not present

## 2022-04-05 DIAGNOSIS — M810 Age-related osteoporosis without current pathological fracture: Secondary | ICD-10-CM | POA: Diagnosis not present

## 2022-04-05 DIAGNOSIS — I442 Atrioventricular block, complete: Secondary | ICD-10-CM | POA: Diagnosis not present

## 2022-04-05 NOTE — Progress Notes (Signed)
Phone [phone]   Subjective:  Patient presents today for their annual physical. Chief complaint-noted.   See problem oriented charting- ROS- full  review of systems was completed and negative except for: stress with husband situation but otherwise feels well- didn't sleep well as a result  The following were reviewed and entered/updated in epic: Past Medical History:  Diagnosis Date   Anxiety    Colon polyp 8657   Complication of anesthesia    slow to wake up one time   Gastroenteritis    w/ renal insufficiency in context of protracted nausea & vomitting 2006   GERD (gastroesophageal reflux disease)    H/O hiatal hernia    Headache(784.0)    occasional lifelong- gabapentin helps a   Hiatal hernia    Hyperlipidemia    Hypertension    w/ LVH on ECHO   Pacemaker 07/2016   Tonsillitis 2006   Patient Active Problem List   Diagnosis Date Noted   Pacemaker - MDT 08/11/2020    Priority: High   Complete heart block ([place]) 04/05/2020    Priority: High   Aortic atherosclerosis ([place]) 04/26/2020    Priority: Medium    Osteoporosis 04/25/2020    Priority: Medium    Carpal tunnel syndrome, right 05/02/2019    Priority: Medium    Diastolic dysfunction 84/69/6295    Priority: Medium    Hyperglycemia 10/09/2014    Priority: Medium    GERD (gastroesophageal reflux disease) 10/22/2013    Priority: Medium    CKD (chronic kidney disease) stage 3, GFR 30-59 ml/min ([place]) 05/05/2008    Priority: Medium    Anxiety state 04/20/2008    Priority: Medium    HYPERLIPIDEMIA 08/12/2007    Priority: Medium    Essential hypertension 08/12/2007    Priority: Medium    Rash 07/04/2017    Priority: Low   Hiatal hernia 08/03/2013    Priority: Low   Personal history of colonic polyps 01/09/2013    Priority: Low   NIGHT SWEATS 02/19/2008    Priority: Low   Past Surgical History:  Procedure Laterality Date   ABDOMINAL HYSTERECTOMY  1976   BSO for endometriosis and bengin tumor    APPENDECTOMY  1970   BREAST LUMPECTOMY Left 70's   X 2   BUNIONECTOMY Bilateral    CATARACT EXTRACTION W/PHACO Left 05/14/2013   Procedure: CATARACT EXTRACTION PHACO AND INTRAOCULAR LENS PLACEMENT ([place]);  Surgeon: Tonny Branch, MD;  Location: [facility];  Service: Ophthalmology;  Laterality: Left;  CDE:9.76   CATARACT EXTRACTION W/PHACO Right 06/08/2013   Procedure: CATARACT EXTRACTION PHACO AND INTRAOCULAR LENS PLACEMENT ([facility]);  Surgeon: Tonny Branch, MD;  Location: [facility];  Service: Ophthalmology;  Laterality: Right;  CDE 13.65   CHOLECYSTECTOMY  1970   COLONOSCOPY W/ POLYPECTOMY  2003   Dr [name]   EP IMPLANTABLE DEVICE N/A 07/18/2016   Procedure: Pacemaker Implant;  Surgeon: Deboraha Sprang, MD;  Location: [place] [facility];  Service: Cardiovascular;  Laterality: N/A;   ESOPHAGEAL MANOMETRY N/A 10/05/2013   Procedure: ESOPHAGEAL MANOMETRY (EM);  Surgeon: Gatha Mayer, MD;  [facility];  Service: Endoscopy;  Laterality: N/A;   ESOPHAGOGASTRODUODENOSCOPY  02/05/13   Large Hiatal Hernia   HERNIA REPAIR     LAPAROSCOPIC NISSEN FUNDOPLICATION Bilateral 28/41/3244   Procedure: LAPAROSCOPIC NISSEN FUNDOPLICATION with hiatal hernia repair ;  Surgeon: Edward Jolly, MD;  Location: [facility];  Service: General;  Laterality: Bilateral;    Family History  Problem Relation Age of Onset  Heart attack Father 87       fatal MI @ 84   Coronary artery disease Mother        CABG; MI @ 24   Diabetes Mother    Stroke Paternal Grandfather        > 75   Stroke Maternal Grandmother        in 90s   Aneurysm Paternal Grandmother        cns ; in 20s   Diabetes Maternal Aunt    Stomach cancer Maternal Aunt    Breast cancer Maternal Aunt    Heart disease Sister    Coronary artery disease Maternal Grandfather    Colon cancer Neg Hx    Esophageal cancer Neg Hx     Medications- reviewed and updated Current Outpatient Medications  Medication Sig Dispense Refill   ALPRAZolam (XANAX) 0.25 MG  tablet TAKE 1 TABLET BY MOUTH 2 TIMES DAILY AS NEEDED FOR ANXIETY. 30 tablet 5   aspirin EC 81 MG tablet Take 81 mg by mouth daily.     Calcium in Bone Mineral Cmplx 350 MG MISC Take 3 each by mouth at bedtime. Takes 1 in the morning and 2 at night     carvedilol (COREG) 6.25 MG tablet Take 1 tablet (6.25 mg total) by mouth 2 (two) times daily with a meal. 180 tablet 3   Cholecalciferol (VITAMIN D3) 2000 UNITS TABS Take 1 tablet by mouth daily with lunch.     esomeprazole (NEXIUM) 40 MG capsule TAKE 1 CAPSULE BY MOUTH TWICE A DAY BEFORE A MEAL 180 capsule 0   ezetimibe-simvastatin (VYTORIN) 10-20 MG tablet TAKE 1 TABLET BY MOUTH EVERYDAY AT BEDTIME 90 tablet 2   famotidine (PEPCID) 40 MG tablet TAKE 1 TABLET BY MOUTH EVERYDAY AT BEDTIME 90 tablet 3   folic acid (FOLVITE) 062 MCG tablet Take 400 mcg by mouth every morning.     furosemide (LASIX) 40 MG tablet Take 1 tablet (40 mg total) by mouth daily. 90 tablet 3   gabapentin (NEURONTIN) 100 MG capsule Take 1 capsule (100 mg total) by mouth at bedtime. 90 capsule 3   KLOR-CON M20 20 MEQ tablet TAKE 1 TABLET (20 MEQ TOTAL) BY MOUTH DAILY AS NEEDED. 90 tablet 1   losartan (COZAAR) 25 MG tablet Take 1 tablet (25 mg total) by mouth daily. 90 tablet 3   Magnesium 400 MG CAPS Take 400 mg by mouth at bedtime.     Omega-3 Fatty Acids (FISH OIL) 500 MG CAPS Take 500 mg by mouth daily with lunch.     No current facility-administered medications for this visit.    Allergies-reviewed and updated Allergies  Allergen Reactions   Chlorthalidone     01/09/13 creatinine 2.2   Hydrochlorothiazide     Hypokalemia   Losartan Potassium-Hctz     Hypokalemia   Norvasc [Amlodipine Besylate]     Edema   Raloxifene     HTN   Rofecoxib     HTN    Social History   Social History Narrative   Lives alone, husband is in a nursing home   Right Handed   Drinks caffeine rarely   Objective  Objective:  BP 120/80   Pulse 76   Temp 98.2 F (36.8 C)   Ht 4'  11" (1.499 m)   Wt 155 lb 12.8 oz (70.7 kg)   SpO2 98%   BMI 31.47 kg/m  Gen: NAD, resting comfortably HEENT: Mucous membranes are moist. Oropharynx normal Neck: no  thyromegaly CV: RRR no murmurs rubs or gallops Lungs: CTAB no crackles, wheeze, rhonchi Abdomen: soft/nontender/nondistended/normal bowel sounds. No rebound or guarding.  Ext: trace nonpitting edema Skin: warm, dry Neuro: grossly normal, moves all extremities, PERRLA   Assessment and Plan   86 y.o. female presenting for annual physical.  Health Maintenance counseling: 1. Anticipatory guidance: Patient counseled regarding regular dental exams -q6 months, eye exams - yearly,  avoiding smoking and second hand smoke , limiting alcohol to 1 beverage per day- sparing glass of wine perhaps every 6 months , no illicit drugs .   2. Risk factor reduction:  Advised patient of need for regular exercise and diet rich and fruits and vegetables to reduce risk of heart attack and stroke.  Exercise- walking to see husband gets good walk in every other day.  Diet/weight management-down 6 lbs from this time last year- watching portion size  Wt Readings from Last 3 Encounters:  04/05/22 155 lb 12.8 oz (70.7 kg)  03/14/22 151 lb 9.6 oz (68.8 kg)  02/26/22 156 lb 2 oz (70.8 kg)  3. Immunizations/screenings/ancillary studies- had covid in October- planning on covid shot in fall Immunization History  Administered Date(s) Administered   Fluad Quad(high Dose 65+) 07/21/2019, 08/30/2020   Influenza Split 10/08/2011   Influenza Whole 09/06/2009, 10/02/2010   Influenza, High Dose Seasonal PF 09/10/2013, 10/10/2015, 10/10/2016, 10/24/2017, 08/18/2018, 07/28/2019, 08/15/2021   Influenza,inj,Quad PF,6+ Mos 10/04/2014   Influenza-Unspecified 08/18/2018   PFIZER(Purple Top)SARS-COV-2 Vaccination 12/17/2019, 01/07/2020, 08/18/2020   Pneumococcal Conjugate-13 10/25/2015   Pneumococcal Polysaccharide-23 10/10/2010   Tdap 10/10/2016   Zoster Recombinat  (Shingrix) 07/21/2019, 09/13/2019, 10/16/2019   Zoster, Live 01/09/2013  4. Cervical cancer screening- past age based screening  recommendations 5. Breast cancer screening-  past age based screening recommendations 6. Colon cancer screening - past age based screening recommendations 7. Skin cancer screening- not recently. advised regular sunscreen use. Denies worrisome, changing, or new skin lesions.  8. Birth control/STD check- not active  9. Osteoporosis screening at 47- see below 10. Smoking associated screening - never smoker  Status of chronic or acute concerns   Other notes: See fatigue last note- patient was doing much better but got upset last night due to a roommate situation for her husband at [facility] . She is trying to get this resolved  #hypertension S: medication:  Carvedilol 6.25 mg BID , Lasix '40mg'$  daily, losartan '25mg'$  (reduced by Dr. Moshe Cipro from '100mg'$  originally to 25 mg)  A/P:Excellent control today-continue current medication  #hyperlipidemia/aortic atherosclerosis-LDL goal at least under 100-ideally under 70 but not increasing medicine for primary prevention S: Medication:  fish oil, Vytorin 10-'20mg'$ , aspirin '81mg'$ - no bleeding history and takes due to strong family history of coronary artery disease Lab Results  Component Value Date   CHOL 169 03/14/2022   HDL 49.50 03/14/2022   LDLCALC 91 03/14/2022   TRIG 144.0 03/14/2022   CHOLHDL 3 03/14/2022   A/P: Reasonable but not ideal control-continue current medication-do not want to further increase prediabetes risk -suspected aortic atherosclerosis stable- continue vytorin and fish oil- she prefers to take asa due to family history  # Anxiety S:Medication:  Xanax once a month or so- very sparing and typically related to frustrations with her husband-see last note but he had been ill recently.  No falls or injuries reported - did have to take last night  A/P: Overall stable-continue current  medication  #severe headaches years ago- was given gabapentin by Dr. Linna Darner after multiple other medication trials and it  did improve symptoms. Has had headaches her entire life. Gabapentin '100mg'$  every 8 months perhaps-I would be willing to refill-if needed-she continues to do relatively well  # Hyperglycemia/insulin resistance/prediabetes- peak a1c June 2020 at 6.4 S:  Medication: None Lab Results  Component Value Date   HGBA1C 5.8 03/14/2022   HGBA1C 5.8 10/02/2021   HGBA1C 5.8 03/31/2021  A/P: With prior fatigue-thankfully no development of diabetes-also clinical A1c is trended down from peak in 2020 of 6.4-continue to monitor for 6 months   #Osteoporosis S: On April 21, 2020 patient had a bone density in the left femoral neck of -2.4 T score.  Her 10-year hip fracture risk is 6% which places her in osteoporosis range.prior in 2015 had been -1.7.  Dr. Linna Darner also wanted her on magnesium -She agrees to take calcium 1200 mg a day and vitamin D 800 units/day -History of severe reflux requiring high-dose PPI twice a day -For this reason we have opted to pursue Reclast instead of Fosamax with team looking into this 04/25/20 but renal function would not tolerate -Saw [facility] endocrinology and recommendation was Reclast 2.5 mg or Prolia-we have attempted several times to get her set up with Prolia-checking again on 04/05/2022 visit  -Weightbearing exercise-encouraged her to start at May 2021 visit and again today- she is doing some walking.  A/P: thankfully no falls- continue to work on getting prolia. On calcium and vitamin D   % Chronic kidney disease Stage III-follows with Dr. Moshe Cipro S: knows to avoid nsaids.  A/P: Recent GFR just above 30-continue to monitor closely-if progresses could consider nephrology consult  % complete heart block- Led to pacemaker placement - follows with Dr. Caryl Comes yearly. q3 month checks at home    % GERD- follows with Dr. [name] -s/p hiatal hernia surgery- bad  experience S: Nexium '40mg'$  BID.   On Apr 05, 2020 was reporting breakthrough symptoms-recommended GI follow-up  -Patient takes B12 on a regular basis to avoid suppression with Nexium A/P: doing well- continue current meds  -also on pepcid now and still has to be careful with her food choices- bread can bother her  Recommended follow up: Return in about 6 months (around 10/06/2022) for followup or sooner if needed.Schedule b4 you leave. Future Appointments  Date Time Provider [place]  04/12/2022  8:40 AM [facility] DEVICE REMOTES [facility]  07/12/2022  8:40 AM [facility] DEVICE REMOTES [facility]  10/11/2022  8:40 AM [facility] DEVICE REMOTES [facility]  01/10/2023  8:40 AM [facility] DEVICE REMOTES [facility]  02/18/2023  9:15 AM Alric Ran, MD [facility] None  04/11/2023  8:40 AM [facility] DEVICE REMOTES [facility]   Lab/Order associations: already had labs   ICD-10-CM   1. Preventative health care  Z00.00     2. Complete heart block (HCC)  I44.2     3. Aortic atherosclerosis (HCC)  I70.0     4. Age-related osteoporosis without current pathological fracture  M81.0     5. Hyperglycemia  R73.9     6. Stage 3 chronic kidney disease, unspecified whether stage 3a or 3b CKD (HCC)  N18.30     7. HYPERLIPIDEMIA  E78.2     8. Essential hypertension  I10       No orders of the defined types were placed in this encounter.   Return precautions advised.  Garret Reddish, MD

## 2022-04-05 NOTE — Patient Instructions (Addendum)
I hope things go well with your visit at [facility] today  Recommended follow up: Return in about 6 months (around 10/06/2022) for followup or sooner if needed.Schedule b4 you leave.

## 2022-04-12 ENCOUNTER — Ambulatory Visit (INDEPENDENT_AMBULATORY_CARE_PROVIDER_SITE_OTHER): Payer: Medicare PPO

## 2022-04-12 DIAGNOSIS — I442 Atrioventricular block, complete: Secondary | ICD-10-CM

## 2022-04-13 ENCOUNTER — Telehealth: Payer: Self-pay

## 2022-04-13 NOTE — Telephone Encounter (Signed)
Prior Authorization initiated for PROLIA via [email or web address] KEY: [id number]

## 2022-04-14 LAB — CUP PACEART REMOTE DEVICE CHECK
Battery Remaining Longevity: 18 mo
Battery Voltage: 2.93 V
Brady Statistic AP VP Percent: 11.87 %
Brady Statistic AP VS Percent: 0 %
Brady Statistic AS VP Percent: 88.05 %
Brady Statistic AS VS Percent: 0.08 %
Brady Statistic RA Percent Paced: 11.82 %
Brady Statistic RV Percent Paced: 99.84 %
Date Time Interrogation Session: 20230602091031
Implantable Lead Implant Date: 20170906
Implantable Lead Implant Date: 20170906
Implantable Lead Location: 753859
Implantable Lead Location: 753860
Implantable Lead Model: 3830
Implantable Lead Model: 5076
Implantable Pulse Generator Implant Date: 20170906
Lead Channel Impedance Value: 304 Ohm
Lead Channel Impedance Value: 399 Ohm
Lead Channel Impedance Value: 399 Ohm
Lead Channel Impedance Value: 494 Ohm
Lead Channel Pacing Threshold Amplitude: 0.5 V
Lead Channel Pacing Threshold Amplitude: 2.25 V
Lead Channel Pacing Threshold Pulse Width: 0.4 ms
Lead Channel Pacing Threshold Pulse Width: 0.4 ms
Lead Channel Sensing Intrinsic Amplitude: 2.375 mV
Lead Channel Sensing Intrinsic Amplitude: 2.375 mV
Lead Channel Sensing Intrinsic Amplitude: 4.625 mV
Lead Channel Sensing Intrinsic Amplitude: 4.625 mV
Lead Channel Setting Pacing Amplitude: 2 V
Lead Channel Setting Pacing Amplitude: 2.5 V
Lead Channel Setting Pacing Pulse Width: 1 ms
Lead Channel Setting Sensing Sensitivity: 0.9 mV

## 2022-04-16 NOTE — Telephone Encounter (Signed)
PA# [id number]; KeyAlecia Lemming Valid: 04/13/22-11/11/22

## 2022-04-24 NOTE — Telephone Encounter (Signed)
Pt ready for scheduling on or after 04/13/22  Out-of-pocket cost due at time of visit: $40  Primary: [employer] Prolia co-insurance: 0% Admin fee co-insurance: $40  Secondary: n/a Prolia co-insurance:  Admin fee co-insurance:   Deductible: does not apply  Prior Auth: APPROVED PA# [id number]; KeyAlecia Lemming Valid: 04/13/22-11/11/22    ** This summary of benefits is an estimation of the patient's out-of-pocket cost. Exact cost may very based on individual plan coverage.

## 2022-04-24 NOTE — Progress Notes (Signed)
Remote pacemaker transmission.   

## 2022-04-25 NOTE — Telephone Encounter (Addendum)
Called patient on 04/25/22 and was able to schedule her for prolia on 05/08/22 at 9:30 am.

## 2022-04-27 ENCOUNTER — Other Ambulatory Visit: Payer: Self-pay | Admitting: Family Medicine

## 2022-05-01 ENCOUNTER — Ambulatory Visit: Payer: Medicare PPO

## 2022-05-08 ENCOUNTER — Ambulatory Visit: Payer: Medicare PPO

## 2022-05-20 ENCOUNTER — Other Ambulatory Visit: Payer: Self-pay | Admitting: Internal Medicine

## 2022-05-22 ENCOUNTER — Ambulatory Visit: Payer: Medicare PPO

## 2022-05-27 NOTE — Telephone Encounter (Signed)
Why did she cancel?

## 2022-05-28 NOTE — Telephone Encounter (Signed)
Did pt state why she canceled?

## 2022-05-30 NOTE — Telephone Encounter (Signed)
Called and spoke with pt and she has nerve damage in her feet that has turned into bad neuropathy and she Is having such a time with her feet and she cant feel herself walk and she is afraid to do anything that may cause her more problems or to fall since this would require her to come out of her home.

## 2022-05-30 NOTE — Telephone Encounter (Signed)
The issue is if she waits too far out she will lose the benefit of prolia and if she has a fall it could cause a fracture

## 2022-06-01 ENCOUNTER — Other Ambulatory Visit: Payer: Self-pay | Admitting: Internal Medicine

## 2022-06-01 NOTE — Telephone Encounter (Signed)
Called and made pt aware.

## 2022-06-11 ENCOUNTER — Other Ambulatory Visit: Payer: Self-pay | Admitting: Family Medicine

## 2022-06-11 ENCOUNTER — Telehealth: Payer: Self-pay | Admitting: Family Medicine

## 2022-06-11 NOTE — Telephone Encounter (Signed)
Copied from [place] [id number]. Topic: Medicare AWV >> Jun 11, 2022 10:29 AM Devoria Glassing wrote: Reason for CRM: Left message for patient to schedule Annual Wellness Visit.  Please schedule with Nurse Health Advisor Charlott Rakes, RN at [facility]. This appt can be telephone or office visit. Please call [phone] ask for [facility]

## 2022-07-12 ENCOUNTER — Ambulatory Visit (INDEPENDENT_AMBULATORY_CARE_PROVIDER_SITE_OTHER): Payer: Medicare PPO

## 2022-07-12 DIAGNOSIS — I442 Atrioventricular block, complete: Secondary | ICD-10-CM | POA: Diagnosis not present

## 2022-07-17 LAB — CUP PACEART REMOTE DEVICE CHECK
Battery Remaining Longevity: 18 mo
Battery Voltage: 2.92 V
Brady Statistic AP VP Percent: 15.28 %
Brady Statistic AP VS Percent: 0 %
Brady Statistic AS VP Percent: 84.72 %
Brady Statistic AS VS Percent: 0 %
Brady Statistic RA Percent Paced: 15.24 %
Brady Statistic RV Percent Paced: 99.99 %
Date Time Interrogation Session: 20230831091424
Implantable Lead Implant Date: 20170906
Implantable Lead Implant Date: 20170906
Implantable Lead Location: 753859
Implantable Lead Location: 753860
Implantable Lead Model: 3830
Implantable Lead Model: 5076
Implantable Pulse Generator Implant Date: 20170906
Lead Channel Impedance Value: 342 Ohm
Lead Channel Impedance Value: 437 Ohm
Lead Channel Impedance Value: 456 Ohm
Lead Channel Impedance Value: 513 Ohm
Lead Channel Pacing Threshold Amplitude: 0.5 V
Lead Channel Pacing Threshold Amplitude: 1.75 V
Lead Channel Pacing Threshold Pulse Width: 0.4 ms
Lead Channel Pacing Threshold Pulse Width: 0.4 ms
Lead Channel Sensing Intrinsic Amplitude: 2 mV
Lead Channel Sensing Intrinsic Amplitude: 2 mV
Lead Channel Sensing Intrinsic Amplitude: 4.375 mV
Lead Channel Sensing Intrinsic Amplitude: 4.375 mV
Lead Channel Setting Pacing Amplitude: 2 V
Lead Channel Setting Pacing Amplitude: 2.5 V
Lead Channel Setting Pacing Pulse Width: 1 ms
Lead Channel Setting Sensing Sensitivity: 0.9 mV

## 2022-08-02 NOTE — Progress Notes (Signed)
Remote pacemaker transmission.   

## 2022-08-13 NOTE — Telephone Encounter (Signed)
Pt archived in [email or web address].  Please advise if patient and/or provider wish to proceed with Prolia therpay.  

## 2022-08-14 DIAGNOSIS — I4891 Unspecified atrial fibrillation: Secondary | ICD-10-CM | POA: Insufficient documentation

## 2022-08-14 DIAGNOSIS — R6889 Other general symptoms and signs: Secondary | ICD-10-CM | POA: Insufficient documentation

## 2022-08-15 ENCOUNTER — Encounter: Payer: Self-pay | Admitting: Internal Medicine

## 2022-08-15 ENCOUNTER — Ambulatory Visit: Payer: Medicare PPO | Attending: Internal Medicine | Admitting: Internal Medicine

## 2022-08-15 VITALS — BP 109/63 | HR 71 | Ht 59.0 in | Wt 156.4 lb

## 2022-08-15 DIAGNOSIS — Z950 Presence of cardiac pacemaker: Secondary | ICD-10-CM | POA: Diagnosis not present

## 2022-08-15 DIAGNOSIS — R6889 Other general symptoms and signs: Secondary | ICD-10-CM

## 2022-08-15 DIAGNOSIS — I442 Atrioventricular block, complete: Secondary | ICD-10-CM | POA: Diagnosis not present

## 2022-08-15 DIAGNOSIS — I4891 Unspecified atrial fibrillation: Secondary | ICD-10-CM | POA: Diagnosis not present

## 2022-08-15 NOTE — Patient Instructions (Addendum)
Medication Instructions:  Your physician has recommended you make the following change in your medication:   ** Stop Aspirin  *If you need a refill on your cardiac medications before your next appointment, please call your pharmacy*   Lab Work: None ordered.  If you have labs (blood work) drawn today and your tests are completely normal, you will receive your results only by: [place] (if you have MyChart) OR A paper copy in the mail If you have any lab test that is abnormal or we need to change your treatment, we will call you to review the results.   Testing/Procedures: None ordered.    Follow-Up: At [facility], you and your health needs are our priority.  As part of our continuing mission to provide you with exceptional heart care, we have created designated Provider Care Teams.  These Care Teams include your primary Cardiologist (physician) and Advanced Practice Providers (APPs -  Physician Assistants and Nurse Practitioners) who all work together to provide you with the care you need, when you need it.  We recommend signing up for the patient portal called "MyChart".  Sign up information is provided on this After Visit Summary.  MyChart is used to connect with patients for Virtual Visits (Telemedicine).  Patients are able to view lab/test results, encounter notes, upcoming appointments, etc.  Non-urgent messages can be sent to your provider as well.   To learn more about what you can do with MyChart, go to [email or web address].    Your next appointment:   9 months with Dr Caryl Comes  Important Information About Sugar

## 2022-08-15 NOTE — Progress Notes (Signed)
Patient Care Team: Marin Olp, MD as PCP - General (Family Medicine)   HPI  Kylie Patrick is a 86 y.o. female Seen in followup for His Bundle pacemaker implanted 9/17 for symptomatic 2:1 block now with CHB She had had syncope and then weakness prior to pacing  12/16 Echo  EF 55-60%   ;    Date Cr K Hgb  6/20 1.29 3.5  12.8  9/21  1.93 3.7 11.7  8/22 1.34 3.1 12.1 (5/22)  5/23 1.50 3.6 13.7   No chest pain or shortness of breath.  No palpitations or syncope. She tells me that her husband died in early August, Jimmy.  Had had a stroke a year before and then developed aspiration pneumonia.  Her daughter along with her Sharlyn Bologna is somewhat better.  Past Medical History:  Diagnosis Date   Anxiety    Colon polyp 9242   Complication of anesthesia    slow to wake up one time   Gastroenteritis    w/ renal insufficiency in context of protracted nausea & vomitting 2006   GERD (gastroesophageal reflux disease)    H/O hiatal hernia    Headache(784.0)    occasional lifelong- gabapentin helps a   Hiatal hernia    Hyperlipidemia    Hypertension    w/ LVH on ECHO   Pacemaker 07/2016   Tonsillitis 2006    Past Surgical History:  Procedure Laterality Date   ABDOMINAL HYSTERECTOMY  1976   BSO for endometriosis and bengin tumor   APPENDECTOMY  1970   BREAST LUMPECTOMY Left 70's   X 2   BUNIONECTOMY Bilateral    CATARACT EXTRACTION W/PHACO Left 05/14/2013   Procedure: CATARACT EXTRACTION PHACO AND INTRAOCULAR LENS PLACEMENT ([place]);  Surgeon: Tonny Branch, MD;  Location: [facility];  Service: Ophthalmology;  Laterality: Left;  CDE:9.76   CATARACT EXTRACTION W/PHACO Right 06/08/2013   Procedure: CATARACT EXTRACTION PHACO AND INTRAOCULAR LENS PLACEMENT ([facility]);  Surgeon: Tonny Branch, MD;  Location: [facility];  Service: Ophthalmology;  Laterality: Right;  CDE 13.65   CHOLECYSTECTOMY  1970   COLONOSCOPY W/ POLYPECTOMY  2003   Dr [name]   EP IMPLANTABLE DEVICE N/A 07/18/2016    Procedure: Pacemaker Implant;  Surgeon: Deboraha Sprang, MD;  Location: [place] [facility];  Service: Cardiovascular;  Laterality: N/A;   ESOPHAGEAL MANOMETRY N/A 10/05/2013   Procedure: ESOPHAGEAL MANOMETRY (EM);  Surgeon: Gatha Mayer, MD;  [facility];  Service: Endoscopy;  Laterality: N/A;   ESOPHAGOGASTRODUODENOSCOPY  02/05/13   Large Hiatal Hernia   HERNIA REPAIR     LAPAROSCOPIC NISSEN FUNDOPLICATION Bilateral 68/34/1962   Procedure: LAPAROSCOPIC NISSEN FUNDOPLICATION with hiatal hernia repair ;  Surgeon: Edward Jolly, MD;  Location: [facility];  Service: General;  Laterality: Bilateral;    Current Outpatient Medications  Medication Sig Dispense Refill   ALPRAZolam (XANAX) 0.25 MG tablet TAKE 1 TABLET BY MOUTH 2 TIMES DAILY AS NEEDED FOR ANXIETY. 30 tablet 5   aspirin EC 81 MG tablet Take 81 mg by mouth daily.     Calcium in Bone Mineral Cmplx 350 MG MISC Take 3 each by mouth at bedtime. Takes 1 in the morning and 2 at night     carvedilol (COREG) 6.25 MG tablet TAKE 1 TABLET BY MOUTH 2 TIMES DAILY WITH A MEAL. 180 tablet 3   Cholecalciferol (VITAMIN D3) 2000 UNITS TABS Take 1 tablet by mouth daily with lunch.     esomeprazole (NEXIUM) 40  MG capsule TAKE 1 CAPSULE BY MOUTH TWICE A DAY BEFORE A MEAL 180 capsule 0   ezetimibe-simvastatin (VYTORIN) 10-20 MG tablet TAKE 1 TABLET BY MOUTH EVERYDAY AT BEDTIME 90 tablet 2   famotidine (PEPCID) 40 MG tablet TAKE 1 TABLET BY MOUTH EVERYDAY AT BEDTIME 90 tablet 0   folic acid (FOLVITE) 381 MCG tablet Take 400 mcg by mouth every morning.     furosemide (LASIX) 40 MG tablet Take 1 tablet (40 mg total) by mouth daily. 90 tablet 3   gabapentin (NEURONTIN) 100 MG capsule Take 1 capsule (100 mg total) by mouth at bedtime. 90 capsule 3   KLOR-CON M20 20 MEQ tablet TAKE 1 TABLET (20 MEQ TOTAL) BY MOUTH DAILY AS NEEDED. (Patient taking differently: 20 mEq daily.) 90 tablet 1   losartan (COZAAR) 25 MG tablet TAKE 1 TABLET (25 MG TOTAL) BY  MOUTH DAILY. 90 tablet 3   Magnesium 400 MG CAPS Take 400 mg by mouth at bedtime.     Omega-3 Fatty Acids (FISH OIL) 500 MG CAPS Take 500 mg by mouth daily with lunch.     No current facility-administered medications for this visit.    Allergies  Allergen Reactions   Chlorthalidone     01/09/13 creatinine 2.2   Hydrochlorothiazide     Hypokalemia   Losartan Potassium-Hctz     Hypokalemia   Norvasc [Amlodipine Besylate]     Edema   Raloxifene     HTN   Rofecoxib     HTN      Review of Systems negative except from HPI and PMH  Physical Exam BP 109/63   Pulse 71   Ht '4\' 11"'$  (1.499 m)   Wt 156 lb 6.4 oz (70.9 kg)   SpO2 96%   BMI 31.59 kg/m  Well developed and well nourished in no acute distress HENT normal Neck supple with JVP-flat Clear Device pocket well healed; without hematoma or erythema.  There is no tethering  Regular rate and rhythm, no   gallop No  murmur Abd-soft with active BS No Clubbing cyanosis  edema Skin-warm and dry A & Oriented  Grossly normal sensory and motor function  ECG sinus P-synchronous/ AV  pacing    Assessment and  Plan  2:1 AVB>> Complete heart block   Pacer- His Medtronic approaching ERI 10/23  Syncope  Hypertension  Exercise intolerance  SCAF  hypokalemia-resolved  Grief  Blood pressure well controlled.  Continue carvedilol as well as losartan  No indication that I can find for aspirin; been on there since 2014.  We will stop it.  Continues with [facility].  With device approaching ERI, we will see her in 9 months      Current medicines are reviewed at length with the patient today .  The patient does not  have concerns regarding medicines.

## 2022-08-22 ENCOUNTER — Other Ambulatory Visit: Payer: Self-pay | Admitting: Internal Medicine

## 2022-08-27 ENCOUNTER — Encounter: Payer: Self-pay | Admitting: *Deleted

## 2022-08-27 ENCOUNTER — Telehealth: Payer: Self-pay | Admitting: *Deleted

## 2022-08-27 NOTE — Patient Outreach (Signed)
  Care Coordination   Initial Visit Note   08/27/2022 Name: Kylie Patrick MRN: [id number] DOB: 12-05-1935  Kylie Patrick is a 86 y.o. year old female who sees Yong Channel, Brayton Mars, MD for primary care. I spoke with  Mardella Layman by phone today.  What matters to the patients health and wellness today?  No needs    Goals Addressed               This Visit's Progress     COMPLETED: "No needs at this time" (pt-stated)        Care Coordination Interventions: Advised patient to scheduled her AWV with her provider for 2023 Reviewed medications with patient and discussed adherence to all medications with no needed refills Reviewed scheduled/upcoming provider appointments including pending appointments Screening for signs and symptoms of depression related to chronic disease state  Assessed social determinant of health barriers          SDOH assessments and interventions completed:  Yes  SDOH Interventions Today    Flowsheet Row Most Recent Value  SDOH Interventions   Food Insecurity Interventions Intervention Not Indicated  Housing Interventions Intervention Not Indicated  Transportation Interventions Intervention Not Indicated  Utilities Interventions Intervention Not Indicated        Care Coordination Interventions Activated:  Yes  Care Coordination Interventions:  Yes, provided   Follow up plan: No further intervention required.   Encounter Outcome:  Pt. Visit Completed   Raina Mina, RN Care Management Coordinator [place] [facility] [phone]

## 2022-08-27 NOTE — Patient Instructions (Signed)
Visit Information  Thank you for taking time to visit with me today. Please don't hesitate to contact me if I can be of assistance to you.   Following are the goals we discussed today:   Goals Addressed               This Visit's Progress     COMPLETED: "No needs at this time" (pt-stated)        Care Coordination Interventions: Advised patient to scheduled her AWV with her provider for 2023 Reviewed medications with patient and discussed adherence to all medications with no needed refills Reviewed scheduled/upcoming provider appointments including pending appointments Screening for signs and symptoms of depression related to chronic disease state  Assessed social determinant of health barriers          Please call the care guide team at [phone] if you need to cancel or reschedule your appointment.   If you are experiencing a Mental Health or [place] or need someone to talk to, please call the Suicide and Crisis Lifeline: 988  The patient verbalized understanding of instructions, educational materials, and care plan provided today and DECLINED offer to receive copy of patient instructions, educational materials, and care plan.   No further follow up required: No needs  Raina Mina, RN Care Management Coordinator [place] [place] [phone]

## 2022-08-31 ENCOUNTER — Other Ambulatory Visit: Payer: Self-pay | Admitting: Internal Medicine

## 2022-09-18 ENCOUNTER — Other Ambulatory Visit: Payer: Self-pay | Admitting: Family Medicine

## 2022-09-20 ENCOUNTER — Ambulatory Visit (INDEPENDENT_AMBULATORY_CARE_PROVIDER_SITE_OTHER): Payer: Medicare PPO

## 2022-09-20 VITALS — Wt 156.0 lb

## 2022-09-20 DIAGNOSIS — Z0000 Encounter for general adult medical examination without abnormal findings: Secondary | ICD-10-CM | POA: Diagnosis not present

## 2022-09-20 NOTE — Patient Instructions (Signed)
Kylie Patrick , Thank you for taking time to come for your Medicare Wellness Visit. I appreciate your ongoing commitment to your health goals. Please review the following plan we discussed and let me know if I can assist you in the future.   These are the goals we discussed:  Goals      Patient Stated     Lose a little more weight         This is a list of the screening recommended for you and due dates:  Health Maintenance  Topic Date Due   COVID-19 Vaccine (4 - Pfizer risk series) 10/13/2020   Medicare Annual Wellness Visit  09/21/2023   Tetanus Vaccine  11/07/2026   Pneumonia Vaccine  Completed   Flu Shot  Completed   DEXA scan (bone density measurement)  Completed   Zoster (Shingles) Vaccine  Completed   HPV Vaccine  Aged Out    Advanced directives: Please bring a copy of your health care power of attorney and living will to the office at your convenience.  Conditions/risks identified: lose a little more weight   Next appointment: Follow up in one year for your annual wellness visit    Preventive Care 65 Years and Older, Female Preventive care refers to lifestyle choices and visits with your health care provider that can promote health and wellness. What does preventive care include? A yearly physical exam. This is also called an annual well check. Dental exams once or twice a year. Routine eye exams. Ask your health care provider how often you should have your eyes checked. Personal lifestyle choices, including: Daily care of your teeth and gums. Regular physical activity. Eating a healthy diet. Avoiding tobacco and drug use. Limiting alcohol use. Practicing safe sex. Taking low-dose aspirin every day. Taking vitamin and mineral supplements as recommended by your health care provider. What happens during an annual well check? The services and screenings done by your health care provider during your annual well check will depend on your age, overall health, lifestyle  risk factors, and family history of disease. Counseling  Your health care provider may ask you questions about your: Alcohol use. Tobacco use. Drug use. Emotional well-being. Home and relationship well-being. Sexual activity. Eating habits. History of falls. Memory and ability to understand (cognition). Work and work Statistician. Reproductive health. Screening  You may have the following tests or measurements: Height, weight, and BMI. Blood pressure. Lipid and cholesterol levels. These may be checked every 5 years, or more frequently if you are over 42 years old. Skin check. Lung cancer screening. You may have this screening every year starting at age 106 if you have a 30-pack-year history of smoking and currently smoke or have quit within the past 15 years. Fecal occult blood test (FOBT) of the stool. You may have this test every year starting at age 60. Flexible sigmoidoscopy or colonoscopy. You may have a sigmoidoscopy every 5 years or a colonoscopy every 10 years starting at age 71. Hepatitis C blood test. Hepatitis B blood test. Sexually transmitted disease (STD) testing. Diabetes screening. This is done by checking your blood sugar (glucose) after you have not eaten for a while (fasting). You may have this done every 1-3 years. Bone density scan. This is done to screen for osteoporosis. You may have this done starting at age 80. Mammogram. This may be done every 1-2 years. Talk to your health care provider about how often you should have regular mammograms. Talk with your health care provider  about your test results, treatment options, and if necessary, the need for more tests. Vaccines  Your health care provider may recommend certain vaccines, such as: Influenza vaccine. This is recommended every year. Tetanus, diphtheria, and acellular pertussis (Tdap, Td) vaccine. You may need a Td booster every 10 years. Zoster vaccine. You may need this after age 26. Pneumococcal  13-valent conjugate (PCV13) vaccine. One dose is recommended after age 32. Pneumococcal polysaccharide (PPSV23) vaccine. One dose is recommended after age 24. Talk to your health care provider about which screenings and vaccines you need and how often you need them. This information is not intended to replace advice given to you by your health care provider. Make sure you discuss any questions you have with your health care provider. Document Released: 11/25/2015 Document Revised: 07/18/2016 Document Reviewed: 08/30/2015 Elsevier Interactive Patient Education  2017 [place] Prevention in the Home Falls can cause injuries. They can happen to people of all ages. There are many things you can do to make your home safe and to help prevent falls. What can I do on the outside of my home? Regularly fix the edges of walkways and driveways and fix any cracks. Remove anything that might make you trip as you walk through a door, such as a raised step or threshold. Trim any bushes or trees on the path to your home. Use bright outdoor lighting. Clear any walking paths of anything that might make someone trip, such as rocks or tools. Regularly check to see if handrails are loose or broken. Make sure that both sides of any steps have handrails. Any raised decks and porches should have guardrails on the edges. Have any leaves, snow, or ice cleared regularly. Use sand or salt on walking paths during winter. Clean up any spills in your garage right away. This includes oil or grease spills. What can I do in the bathroom? Use night lights. Install grab bars by the toilet and in the tub and shower. Do not use towel bars as grab bars. Use non-skid mats or decals in the tub or shower. If you need to sit down in the shower, use a plastic, non-slip stool. Keep the floor dry. Clean up any water that spills on the floor as soon as it happens. Remove soap buildup in the tub or shower regularly. Attach  bath mats securely with double-sided non-slip rug tape. Do not have throw rugs and other things on the floor that can make you trip. What can I do in the bedroom? Use night lights. Make sure that you have a light by your bed that is easy to reach. Do not use any sheets or blankets that are too big for your bed. They should not hang down onto the floor. Have a firm chair that has side arms. You can use this for support while you get dressed. Do not have throw rugs and other things on the floor that can make you trip. What can I do in the kitchen? Clean up any spills right away. Avoid walking on wet floors. Keep items that you use a lot in easy-to-reach places. If you need to reach something above you, use a strong step stool that has a grab bar. Keep electrical cords out of the way. Do not use floor polish or wax that makes floors slippery. If you must use wax, use non-skid floor wax. Do not have throw rugs and other things on the floor that can make you trip. What can I do  with my stairs? Do not leave any items on the stairs. Make sure that there are handrails on both sides of the stairs and use them. Fix handrails that are broken or loose. Make sure that handrails are as long as the stairways. Check any carpeting to make sure that it is firmly attached to the stairs. Fix any carpet that is loose or worn. Avoid having throw rugs at the top or bottom of the stairs. If you do have throw rugs, attach them to the floor with carpet tape. Make sure that you have a light switch at the top of the stairs and the bottom of the stairs. If you do not have them, ask someone to add them for you. What else can I do to help prevent falls? Wear shoes that: Do not have high heels. Have rubber bottoms. Are comfortable and fit you well. Are closed at the toe. Do not wear sandals. If you use a stepladder: Make sure that it is fully opened. Do not climb a closed stepladder. Make sure that both sides of the  stepladder are locked into place. Ask someone to hold it for you, if possible. Clearly mark and make sure that you can see: Any grab bars or handrails. First and last steps. Where the edge of each step is. Use tools that help you move around (mobility aids) if they are needed. These include: Canes. Walkers. Scooters. Crutches. Turn on the lights when you go into a dark area. Replace any light bulbs as soon as they burn out. Set up your furniture so you have a clear path. Avoid moving your furniture around. If any of your floors are uneven, fix them. If there are any pets around you, be aware of where they are. Review your medicines with your doctor. Some medicines can make you feel dizzy. This can increase your chance of falling. Ask your doctor what other things that you can do to help prevent falls. This information is not intended to replace advice given to you by your health care provider. Make sure you discuss any questions you have with your health care provider. Document Released: 08/25/2009 Document Revised: 04/05/2016 Document Reviewed: 12/03/2014 Elsevier Interactive Patient Education  2017 [employer].

## 2022-09-20 NOTE — Progress Notes (Addendum)
I connected with  Mardella Layman on 09/20/22 by a audio enabled telemedicine application and verified that I am speaking with the correct person using two identifiers.  Patient Location: Home  Provider Location: Office/Clinic  I discussed the limitations of evaluation and management by telemedicine. The patient expressed understanding and agreed to proceed.   Subjective:   Kylie Patrick is a 86 y.o. female who presents for Medicare Annual (Subsequent) preventive examination.  Review of Systems     Cardiac Risk Factors include: advanced age (>62mn, >>11women);hypertension;dyslipidemia;diabetes mellitus;obesity (BMI >30kg/m2)     Objective:    Today's Vitals   09/20/22 1358  Weight: 156 lb (70.8 kg)   Body mass index is 31.51 kg/m.     09/20/2022    2:05 PM 03/11/2020    9:04 AM 04/24/2017   10:57 AM 07/17/2016    6:00 PM 07/17/2016    2:06 PM 07/08/2016    3:29 PM 10/22/2013    5:29 PM  Advanced Directives  Does Patient Have a Medical Advance Directive? Yes Yes No No No No Patient does not have advance directive;Patient would not like information  Type of AScientist, forensicPower of ALa MiradaLiving will [place]      Does patient want to make changes to medical advance directive?  No - Patient declined       Copy of [place] Chart? No - copy requested No - copy requested       Would patient like information on creating a medical advance directive?    No - patient declined information No - patient declined information    Pre-existing out of facility DNR order (yellow form or pink MOST form)       No    Current Medications (verified) Outpatient Encounter Medications as of 09/20/2022  Medication Sig   ALPRAZolam (XANAX) 0.25 MG tablet TAKE 1 TABLET BY MOUTH 2 TIMES DAILY AS NEEDED FOR ANXIETY.   Calcium in Bone Mineral Cmplx 350 MG MISC Take 3 each by mouth at bedtime. Takes 1 in the morning and 2 at night   carvedilol  (COREG) 6.25 MG tablet TAKE 1 TABLET BY MOUTH 2 TIMES DAILY WITH A MEAL.   Cholecalciferol (VITAMIN D3) 2000 UNITS TABS Take 1 tablet by mouth daily with lunch.   esomeprazole (NEXIUM) 40 MG capsule TAKE 1 CAPSULE BY MOUTH TWICE A DAY BEFORE A MEAL   ezetimibe-simvastatin (VYTORIN) 10-20 MG tablet TAKE 1 TABLET BY MOUTH EVERYDAY AT BEDTIME   famotidine (PEPCID) 40 MG tablet TAKE 1 TABLET BY MOUTH EVERYDAY AT BEDTIME   folic acid (FOLVITE) 4481MCG tablet Take 400 mcg by mouth every morning.   furosemide (LASIX) 40 MG tablet Take 1 tablet (40 mg total) by mouth daily.   gabapentin (NEURONTIN) 100 MG capsule Take 1 capsule (100 mg total) by mouth at bedtime. (Patient taking differently: Take 100 mg by mouth as needed.)   KLOR-CON M20 20 MEQ tablet TAKE 1 TABLET (20 MEQ TOTAL) BY MOUTH DAILY AS NEEDED.   losartan (COZAAR) 25 MG tablet TAKE 1 TABLET (25 MG TOTAL) BY MOUTH DAILY.   Magnesium 400 MG CAPS Take 400 mg by mouth at bedtime.   Omega-3 Fatty Acids (FISH OIL) 500 MG CAPS Take 500 mg by mouth daily with lunch.   [DISCONTINUED] aspirin EC 81 MG tablet Take 81 mg by mouth daily.   No facility-administered encounter medications on file as of 09/20/2022.    Allergies (verified)  Chlorthalidone, Hydrochlorothiazide, Losartan potassium-hctz, Norvasc [amlodipine besylate], Raloxifene, and Rofecoxib   History: Past Medical History:  Diagnosis Date   Anxiety    Colon polyp 2458   Complication of anesthesia    slow to wake up one time   Gastroenteritis    w/ renal insufficiency in context of protracted nausea & vomitting 2006   GERD (gastroesophageal reflux disease)    H/O hiatal hernia    Headache(784.0)    occasional lifelong- gabapentin helps a   Hiatal hernia    Hyperlipidemia    Hypertension    w/ LVH on ECHO   Pacemaker 07/2016   Tonsillitis 2006   Past Surgical History:  Procedure Laterality Date   ABDOMINAL HYSTERECTOMY  1976   BSO for endometriosis and bengin tumor    APPENDECTOMY  1970   BREAST LUMPECTOMY Left 70's   X 2   BUNIONECTOMY Bilateral    CATARACT EXTRACTION W/PHACO Left 05/14/2013   Procedure: CATARACT EXTRACTION PHACO AND INTRAOCULAR LENS PLACEMENT ([place]);  Surgeon: Tonny Branch, MD;  Location: [facility];  Service: Ophthalmology;  Laterality: Left;  CDE:9.76   CATARACT EXTRACTION W/PHACO Right 06/08/2013   Procedure: CATARACT EXTRACTION PHACO AND INTRAOCULAR LENS PLACEMENT (IOC);  Surgeon: Tonny Branch, MD;  Location: [facility];  Service: Ophthalmology;  Laterality: Right;  CDE 13.65   CHOLECYSTECTOMY  1970   COLONOSCOPY W/ POLYPECTOMY  2003   Dr [name]   EP IMPLANTABLE DEVICE N/A 07/18/2016   Procedure: Pacemaker Implant;  Surgeon: Deboraha Sprang, MD;  Location: [facility];  Service: Cardiovascular;  Laterality: N/A;   ESOPHAGEAL MANOMETRY N/A 10/05/2013   Procedure: ESOPHAGEAL MANOMETRY (EM);  Surgeon: Gatha Mayer, MD;  Location: [facility];  Service: Endoscopy;  Laterality: N/A;   ESOPHAGOGASTRODUODENOSCOPY  02/05/13   Large Hiatal Hernia   HERNIA REPAIR     LAPAROSCOPIC NISSEN FUNDOPLICATION Bilateral 09/98/3382   Procedure: LAPAROSCOPIC NISSEN FUNDOPLICATION with hiatal hernia repair ;  Surgeon: Edward Jolly, MD;  Location: [facility];  Service: General;  Laterality: Bilateral;   Family History  Problem Relation Age of Onset   Heart attack Father 54       fatal MI @ 60   Coronary artery disease Mother        CABG; MI @ 57   Diabetes Mother    Stroke Paternal Grandfather        > 16   Stroke Maternal Grandmother        in 54s   Aneurysm Paternal Grandmother        cns ; in 45s   Diabetes Maternal Aunt    Stomach cancer Maternal Aunt    Breast cancer Maternal Aunt    Heart disease Sister    Coronary artery disease Maternal Grandfather    Colon cancer Neg Hx    Esophageal cancer Neg Hx    Social History   Socioeconomic History   Marital status: Widowed    Spouse name: Not on file   Number of children: 2   Years of  education: Not on file   Highest education level: Not on file  Occupational History   Occupation: Retired    Fish farm manager: RETIRED  Tobacco Use   Smoking status: Never   Smokeless tobacco: Never  Vaping Use   Vaping Use: Never used  Substance and Sexual Activity   Alcohol use: Yes    Comment: Wine-VERY RARELY   Drug use: No   Sexual activity: Not Currently  Other Topics Concern  Not on file  Social History Narrative   Lives alone, husband is in a nursing home   Right Handed   Drinks caffeine rarely   Social Determinants of Health   Financial Resource Strain: Low Risk  (09/20/2022)   Overall Financial Resource Strain (CARDIA)    Difficulty of Paying Living Expenses: Not hard at all  Food Insecurity: No Food Insecurity (09/20/2022)   Hunger Vital Sign    Worried About Running Out of Food in the Last Year: Never true    Ran Out of Food in the Last Year: Never true  Transportation Needs: No Transportation Needs (09/20/2022)   PRAPARE - Hydrologist (Medical): No    Lack of Transportation (Non-Medical): No  Physical Activity: Inactive (09/20/2022)   Exercise Vital Sign    Days of Exercise per Week: 0 days    Minutes of Exercise per Session: 0 min  Stress: No Stress Concern Present (09/20/2022)   [place]    Feeling of Stress : Not at all  Social Connections: Socially Isolated (09/20/2022)   Social Connection and Isolation Panel [NHANES]    Frequency of Communication with Friends and Family: More than three times a week    Frequency of Social Gatherings with Friends and Family: More than three times a week    Attends Religious Services: Never    Marine scientist or Organizations: No    Attends Archivist Meetings: Never    Marital Status: Widowed    Tobacco Counseling Counseling given: Not Answered   Clinical Intake:  Pre-visit preparation completed: Yes  Pain :  No/denies pain     BMI - recorded: 31.51 Nutritional Status: BMI > 30  Obese Nutritional Risks: None Diabetes: No  How often do you need to have someone help you when you read instructions, pamphlets, or other written materials from your doctor or pharmacy?: 1 - Never  Diabetic?no  Interpreter Needed?: No  Information entered by :: Charlott Rakes, LPN   Activities of Daily Living    09/20/2022    2:06 PM  In your present state of health, do you have any difficulty performing the following activities:  Hearing? 0  Vision? 0  Difficulty concentrating or making decisions? 0  Walking or climbing stairs? 0  Dressing or bathing? 0  Doing errands, shopping? 0  Preparing Food and eating ? N  Using the Toilet? N  In the past six months, have you accidently leaked urine? N  Do you have problems with loss of bowel control? N  Managing your Medications? N  Managing your Finances? N  Housekeeping or managing your Housekeeping? N    Patient Care Team: Marin Olp, MD as PCP - General (Family Medicine)  Indicate any recent Medical Services you may have received from other than [facility] providers in the past year (date may be approximate).     Assessment:   This is a routine wellness examination for [place].  Hearing/Vision screen Hearing Screening - Comments:: Pt denies any hearing issues Vision Screening - Comments:: Pt follows up with Dr Jorja Loa for eye exams   Dietary issues and exercise activities discussed: Current Exercise Habits: The patient does not participate in regular exercise at present   Goals Addressed             This Visit's Progress    Patient Stated       Lose a little more weight  Depression Screen    09/20/2022    2:02 PM 08/27/2022    2:42 PM 04/05/2022   11:00 AM 03/31/2021    8:39 AM 10/10/2020    1:21 PM 04/25/2020    3:00 PM 03/11/2020    9:05 AM  PHQ 2/9 Scores  PHQ - 2 Score 0 0 1 3 0 0 0  PHQ- 9 Score   4 8  0     Fall  Risk    09/20/2022    2:05 PM 06/19/2021    1:39 PM 03/31/2021    8:15 AM 10/10/2020    1:21 PM 03/11/2020    9:05 AM  [place] in the past year? 0 0 0 0 0  Number falls in past yr: 0 0 0 0 0  Injury with Fall? 0 0 0 0 0  Risk for fall due to : Impaired vision No Fall Risks   No Fall Risks  Follow up Falls prevention discussed Falls evaluation completed   Falls evaluation completed;Education provided    FALL RISK PREVENTION PERTAINING TO THE HOME:  Any stairs in or around the home? Yes  If so, are there any without handrails? Yes  Home free of loose throw rugs in walkways, pet beds, electrical cords, etc? Yes  Adequate lighting in your home to reduce risk of falls? Yes   ASSISTIVE DEVICES UTILIZED TO PREVENT FALLS:  Life alert? No  Use of a cane, walker or w/c? No  Grab bars in the bathroom? Yes  Shower chair or bench in shower? Yes  Elevated toilet seat or a handicapped toilet? No   TIMED UP AND GO:  Was the test performed? No .  Cognitive Function:        09/20/2022    2:06 PM 03/11/2020    9:07 AM  6CIT Screen  What Year? 0 points 0 points  What month? 0 points 0 points  What time? 0 points 0 points  Count back from 20 0 points 0 points  Months in reverse 0 points 0 points  Repeat phrase 0 points 0 points  Total Score 0 points 0 points    Immunizations Immunization History  Administered Date(s) Administered   Fluad Quad(high Dose 65+) 07/21/2019, 08/30/2020, 08/01/2022   Influenza Split 10/08/2011   Influenza Whole 09/06/2009, 10/02/2010   Influenza, High Dose Seasonal PF 09/10/2013, 10/10/2015, 10/10/2016, 10/24/2017, 08/18/2018, 07/28/2019, 08/15/2021   Influenza,inj,Quad PF,6+ Mos 10/04/2014   Influenza-Unspecified 08/18/2018   PFIZER(Purple Top)SARS-COV-2 Vaccination 12/17/2019, 01/07/2020, 08/18/2020   Pneumococcal Conjugate-13 10/25/2015   Pneumococcal Polysaccharide-23 10/10/2010   Tdap 10/10/2016   Zoster Recombinat (Shingrix) 07/21/2019,  09/13/2019, 10/16/2019   Zoster, Live 01/09/2013    TDAP status: Up to date  Flu Vaccine status: Up to date  Pneumococcal vaccine status: Up to date  Covid-19 vaccine status: Completed vaccines  Qualifies for Shingles Vaccine? Yes   Zostavax completed Yes   Shingrix Completed?: Yes  Screening Tests Health Maintenance  Topic Date Due   COVID-19 Vaccine (4 - Pfizer risk series) 10/13/2020   Medicare Annual Wellness (AWV)  09/21/2023   TETANUS/TDAP  11/07/2026   Pneumonia Vaccine 18+ Years old  Completed   INFLUENZA VACCINE  Completed   DEXA SCAN  Completed   Zoster Vaccines- Shingrix  Completed   HPV VACCINES  Aged Out    Health Maintenance  Health Maintenance Due  Topic Date Due   COVID-19 Vaccine (4 - Pfizer risk series) 10/13/2020    Colorectal cancer screening: No  longer required.   Mammogram status: No longer required due to age.  Bone Density status: Completed 04/21/20. Results reflect: Bone density results: OSTEOPOROSIS. Repeat every 2 years.   Additional Screening:   Vision Screening: Recommended annual ophthalmology exams for early detection of glaucoma and other disorders of the eye. Is the patient up to date with their annual eye exam?  Yes  Who is the provider or what is the name of the office in which the patient attends annual eye exams? Dr Jorja Loa  If pt is not established with a provider, would they like to be referred to a provider to establish care? No .   Dental Screening: Recommended annual dental exams for proper oral hygiene  Community Resource Referral / Chronic Care Management: CRR required this visit?  No   CCM required this visit?  No      Plan:     I have personally reviewed and noted the following in the patient's chart:   Medical and social history Use of alcohol, tobacco or illicit drugs  Current medications and supplements including opioid prescriptions. Patient is not currently taking opioid prescriptions. Functional  ability and status Nutritional status Physical activity Advanced directives List of other physicians Hospitalizations, surgeries, and ER visits in previous 12 months Vitals Screenings to include cognitive, depression, and falls Referrals and appointments  In addition, I have reviewed and discussed with patient certain preventive protocols, quality metrics, and best practice recommendations. A written personalized care plan for preventive services as well as general preventive health recommendations were provided to patient.     Willette Brace, LPN   19/05/5882   Nurse Notes: none

## 2022-10-01 ENCOUNTER — Encounter: Payer: Self-pay | Admitting: Family Medicine

## 2022-10-01 ENCOUNTER — Ambulatory Visit: Payer: Medicare PPO | Admitting: Family Medicine

## 2022-10-01 VITALS — BP 106/70 | HR 70 | Temp 98.1°F | Ht 59.0 in | Wt 154.2 lb

## 2022-10-01 DIAGNOSIS — M810 Age-related osteoporosis without current pathological fracture: Secondary | ICD-10-CM | POA: Diagnosis not present

## 2022-10-01 DIAGNOSIS — I10 Essential (primary) hypertension: Secondary | ICD-10-CM | POA: Diagnosis not present

## 2022-10-01 DIAGNOSIS — E782 Mixed hyperlipidemia: Secondary | ICD-10-CM

## 2022-10-01 DIAGNOSIS — N183 Chronic kidney disease, stage 3 unspecified: Secondary | ICD-10-CM

## 2022-10-01 DIAGNOSIS — R739 Hyperglycemia, unspecified: Secondary | ICD-10-CM

## 2022-10-01 LAB — COMPREHENSIVE METABOLIC PANEL
ALT: 14 U/L (ref 0–35)
AST: 23 U/L (ref 0–37)
Albumin: 4.2 g/dL (ref 3.5–5.2)
Alkaline Phosphatase: 53 U/L (ref 39–117)
BUN: 18 mg/dL (ref 6–23)
CO2: 29 mEq/L (ref 19–32)
Calcium: 10 mg/dL (ref 8.4–10.5)
Chloride: 101 mEq/L (ref 96–112)
Creatinine, Ser: 1.45 mg/dL — ABNORMAL HIGH (ref 0.40–1.20)
GFR: 33.15 mL/min — ABNORMAL LOW (ref >60.00)
Glucose, Bld: 103 mg/dL — ABNORMAL HIGH (ref 70–99)
Potassium: 3.9 mEq/L (ref 3.5–5.1)
Sodium: 140 mEq/L (ref 135–145)
Total Bilirubin: 0.7 mg/dL (ref 0.2–1.2)
Total Protein: 6.5 g/dL (ref 6.0–8.3)

## 2022-10-01 LAB — HEMOGLOBIN A1C: Hgb A1c MFr Bld: 6 % (ref 4.6–6.5)

## 2022-10-01 MED ORDER — ALPRAZOLAM 0.25 MG PO TABS
0.2500 mg | ORAL_TABLET | Freq: Two times a day (BID) | ORAL | 5 refills | Status: DC | PRN
Start: 2022-10-01 — End: 2023-08-06

## 2022-10-01 NOTE — Progress Notes (Addendum)
Phone [phone] In person visit   Subjective:   Kylie Patrick is a 86 y.o. year old very pleasant female patient who presents for/with See problem oriented charting Chief Complaint  Patient presents with   Follow-up   Hypertension    Past Medical History-  Patient Active Problem List   Diagnosis Date Noted   Pacemaker - MDT 08/11/2020    Priority: High   Complete heart block ([place]) 04/05/2020    Priority: High   Aortic atherosclerosis ([place]) 04/26/2020    Priority: Medium    Osteoporosis 04/25/2020    Priority: Medium    Carpal tunnel syndrome, right 05/02/2019    Priority: Medium    Diastolic dysfunction 09/81/1914    Priority: Medium    Hyperglycemia 10/09/2014    Priority: Medium    GERD (gastroesophageal reflux disease) 10/22/2013    Priority: Medium    CKD (chronic kidney disease) stage 3, GFR 30-59 ml/min ([place]) 05/05/2008    Priority: Medium    Anxiety state 04/20/2008    Priority: Medium    HYPERLIPIDEMIA 08/12/2007    Priority: Medium    Essential hypertension 08/12/2007    Priority: Medium    Rash 07/04/2017    Priority: Low   Hiatal hernia 08/03/2013    Priority: Low   Personal history of colonic polyps 01/09/2013    Priority: Low   NIGHT SWEATS 02/19/2008    Priority: Low   Exercise intolerance 08/14/2022   Atrial fibrillation ([place]) -SCAF 08/14/2022    Medications- reviewed and updated Current Outpatient Medications  Medication Sig Dispense Refill   Calcium in Bone Mineral Cmplx 350 MG MISC Take 3 each by mouth at bedtime. Takes 1 in the morning and 2 at night     carvedilol (COREG) 6.25 MG tablet TAKE 1 TABLET BY MOUTH 2 TIMES DAILY WITH A MEAL. 180 tablet 3   Cholecalciferol (VITAMIN D3) 2000 UNITS TABS Take 1 tablet by mouth daily with lunch.     esomeprazole (NEXIUM) 40 MG capsule TAKE 1 CAPSULE BY MOUTH TWICE A DAY BEFORE A MEAL 180 capsule 0   ezetimibe-simvastatin (VYTORIN) 10-20 MG tablet TAKE 1 TABLET BY MOUTH EVERYDAY AT BEDTIME 90  tablet 2   famotidine (PEPCID) 40 MG tablet TAKE 1 TABLET BY MOUTH EVERYDAY AT BEDTIME 90 tablet 0   folic acid (FOLVITE) 782 MCG tablet Take 400 mcg by mouth every morning.     furosemide (LASIX) 40 MG tablet Take 1 tablet (40 mg total) by mouth daily. 90 tablet 3   gabapentin (NEURONTIN) 100 MG capsule Take 1 capsule (100 mg total) by mouth at bedtime. (Patient taking differently: Take 100 mg by mouth as needed.) 90 capsule 3   KLOR-CON M20 20 MEQ tablet TAKE 1 TABLET (20 MEQ TOTAL) BY MOUTH DAILY AS NEEDED. 90 tablet 1   losartan (COZAAR) 25 MG tablet TAKE 1 TABLET (25 MG TOTAL) BY MOUTH DAILY. 90 tablet 3   Magnesium 400 MG CAPS Take 400 mg by mouth at bedtime.     Omega-3 Fatty Acids (FISH OIL) 500 MG CAPS Take 500 mg by mouth daily with lunch.     ALPRAZolam (XANAX) 0.25 MG tablet Take 1 tablet (0.25 mg total) by mouth 2 (two) times daily as needed for anxiety. 30 tablet 5   No current facility-administered medications for this visit.     Objective:  BP 106/70   Pulse 70   Temp 98.1 F (36.7 C)   Ht '4\' 11"'$  (1.499 m)   Wt  154 lb 3.2 oz (69.9 kg)   SpO2 98%   BMI 31.14 kg/m  Gen: NAD, resting comfortably CV: RRR no murmurs rubs or gallops Lungs: CTAB no crackles, wheeze, rhonchi Ext: no edema- on lasix Skin: warm, dry    Assessment and Plan   # Social update- lost husband Laverna Peace August 2023. Had covid then pneumonia and unable to recover.    % complete heart block- Led to pacemaker placement - follows with Dr. Caryl Comes yearly. q3 month checks at home .  She was recently seen on 08/15/2022-overall considered stable and aspirin was stopped as no clear indication noted per Dr. Caryl Comes. Will need replacement in 2024 usually  #hypertension S: medication:  Carvedilol 6.25 mg BID , Lasix '40mg'$  daily, losartan '25mg'$  (reduced by Dr. Moshe Cipro from '100mg'$  originally to 25 mg)  BP Readings from Last 3 Encounters:  10/01/22 106/70  08/15/22 109/63  04/05/22 120/80   A/P:Controlled.  Continue current medications.  #hyperlipidemia/aortic atherosclerosis-LDL goal at least under 100-ideally under 70 but not increasing medicine for primary prevention S: Medication:  fish oil , Vytorin 10-'20mg'$  -Prior aspirin '81mg'$ - no bleeding history and takes due to strong family history of coronary artery disease.  Dr. Caryl Comes stopped 08/15/2022 Lab Results  Component Value Date   CHOL 169 03/14/2022   HDL 49.50 03/14/2022   LDLCALC 91 03/14/2022   TRIG 144.0 03/14/2022   CHOLHDL 3 03/14/2022   A/P: Above ideal goal for aortic atherosclerosis with LDL goal 70 or less-at her age we have opted to continue current medication- she agrees  # Anxiety S:Medication:  Xanax once a month or so in past- very sparing and typically related to frustrations with her husband.  No falls or injuries reported  A/P: Still sparingly taking-we will refill today -discussed between osteoporosis, neuropathy, xanax risk discussed fall risk- she agrees to use cane as needed- she agrees to be intentional about this  #Neuropathy- has seen Dr. April Manson starting 08/10/21- taking gabapentin mainly as needed -Gabapentin '100mg'$  every 8 months perhaps- per neurology. Trying to wear supportive shoes  -she is considering having me prescribe and stopping neurology visits- I am willing to fill if needed  #severe headaches years ago- was given gabapentin in past by Dr. Linna Darner after multiple other medication trials and it did improve symptoms. Has had headaches her entire life. Gabapentin for neuropathy- also helps this   # Hyperglycemia/insulin resistance/prediabetes- peak a1c June 2020 at 6.4 S:  Medication: none Exercise and diet- primary treatment have been recommended lifestyle changes-weight down 1 pound from last visit Lab Results  Component Value Date   HGBA1C 5.8 03/14/2022   HGBA1C 5.8 10/02/2021   HGBA1C 5.8 03/31/2021  A/P: hopefully stable- update a1c today.   #Osteoporosis S: On April 21, 2020 patient had a bone  density in the left femoral neck of -2.4 T score.  Her 10-year hip fracture risk is 6% which places her in osteoporosis range.prior in 2015 had been -1.7.  Dr. Linna Darner also wanted her on magnesium -She agrees to take calcium 1200 mg a day and vitamin D 800 units/day -History of severe reflux requiring high-dose PPI twice a day -For this reason we have opted to pursue Reclast instead of Fosamax with team looking into this 04/25/20 but renal function would not tolerate -Saw [facility] endocrinology and recommendation was Reclast 2.5 mg or Prolia-we have attempted several times to get her set up with Prolia- she is not sure that she would be able to consistently make it to visits   -  Weightbearing exercise-encouraged her to exercise- has been hard for her A/P: she declines prolia for now but agrees to dexa and will reconsider if symptoms worsen   % Chronic kidney disease Stage III-follows with Dr. Moshe Cipro S: knows to avoid nsaids.  Last visit in the chart 09/01/2021 A/P: she is overdue for visit- update cmp and encouraged to schedule follow up   Recommended follow up: Return in about 6 months (around 04/01/2023) for physical or sooner if needed.Schedule b4 you leave. Future Appointments  Date Time Provider [place]  10/11/2022  8:40 AM [facility] DEVICE REMOTES [facility]  01/10/2023  8:40 AM [facility] DEVICE REMOTES [facility]  02/18/2023  9:15 AM Alric Ran, MD [facility] None  04/11/2023  8:40 AM [facility] DEVICE REMOTES [facility]   Lab/Order associations:   ICD-10-CM   1. HYPERLIPIDEMIA  E78.2     2. Hyperglycemia  R73.9 HgB A1c    3. Essential hypertension  I10 Comprehensive metabolic panel    4. Stage 3 chronic kidney disease, unspecified whether stage 3a or 3b CKD (HCC)  N18.30     5. Age-related osteoporosis without current pathological fracture  M81.0 DG Bone Density      Meds ordered this encounter  Medications   ALPRAZolam  (XANAX) 0.25 MG tablet    Sig: Take 1 tablet (0.25 mg total) by mouth 2 (two) times daily as needed for anxiety.    Dispense:  30 tablet    Refill:  5    This request is for a new prescription for a controlled substance as required by Federal/State law.    Return precautions advised.  Garret Reddish, MD

## 2022-10-01 NOTE — Patient Instructions (Addendum)
Schedule your bone density test at check out desk.  - located [address] across the street from [place] - in the basement - you DO NEED an appointment for the bone density tests.   Please stop by lab before you go If you have mychart- we will send your results within 3 business days of [place] receiving them.  If you do not have mychart- we will call you about results within 5 business days of [place] receiving them.  *please also note that you will see labs on mychart as soon as they post. I will later go in and write notes on them- will say "notes from Dr. Yong Channel"   Recommended follow up: Return in about 6 months (around 04/01/2023) for physical or sooner if needed.Schedule b4 you leave.

## 2022-10-03 ENCOUNTER — Ambulatory Visit (INDEPENDENT_AMBULATORY_CARE_PROVIDER_SITE_OTHER)
Admission: RE | Admit: 2022-10-03 | Discharge: 2022-10-03 | Disposition: A | Discharge status: Home / Self Care | Payer: Medicare PPO | Source: Ambulatory Visit | Attending: Family Medicine | Admitting: Family Medicine

## 2022-10-03 DIAGNOSIS — M810 Age-related osteoporosis without current pathological fracture: Secondary | ICD-10-CM

## 2022-10-11 ENCOUNTER — Ambulatory Visit (INDEPENDENT_AMBULATORY_CARE_PROVIDER_SITE_OTHER): Payer: Medicare PPO

## 2022-10-11 DIAGNOSIS — I442 Atrioventricular block, complete: Secondary | ICD-10-CM

## 2022-10-13 LAB — CUP PACEART REMOTE DEVICE CHECK
Battery Remaining Longevity: 16 mo
Battery Voltage: 2.91 V
Brady Statistic AP VP Percent: 25.64 %
Brady Statistic AP VS Percent: 0 %
Brady Statistic AS VP Percent: 74.36 %
Brady Statistic AS VS Percent: 0 %
Brady Statistic RA Percent Paced: 25.62 %
Brady Statistic RV Percent Paced: 99.99 %
Date Time Interrogation Session: 20231201142018
Implantable Lead Connection Status: 753985
Implantable Lead Connection Status: 753985
Implantable Lead Implant Date: 20170906
Implantable Lead Implant Date: 20170906
Implantable Lead Location: 753859
Implantable Lead Location: 753860
Implantable Lead Model: 3830
Implantable Lead Model: 5076
Implantable Pulse Generator Implant Date: 20170906
Lead Channel Impedance Value: 323 Ohm
Lead Channel Impedance Value: 399 Ohm
Lead Channel Impedance Value: 456 Ohm
Lead Channel Impedance Value: 532 Ohm
Lead Channel Pacing Threshold Amplitude: 0.5 V
Lead Channel Pacing Threshold Amplitude: 1.375 V
Lead Channel Pacing Threshold Pulse Width: 0.4 ms
Lead Channel Pacing Threshold Pulse Width: 0.4 ms
Lead Channel Sensing Intrinsic Amplitude: 1.875 mV
Lead Channel Sensing Intrinsic Amplitude: 1.875 mV
Lead Channel Sensing Intrinsic Amplitude: 5.125 mV
Lead Channel Sensing Intrinsic Amplitude: 5.125 mV
Lead Channel Setting Pacing Amplitude: 2 V
Lead Channel Setting Pacing Amplitude: 2.5 V
Lead Channel Setting Pacing Pulse Width: 1 ms
Lead Channel Setting Sensing Sensitivity: 0.9 mV
Zone Setting Status: 755011
Zone Setting Status: 755011

## 2022-10-22 DIAGNOSIS — H04123 Dry eye syndrome of bilateral lacrimal glands: Secondary | ICD-10-CM | POA: Diagnosis not present

## 2022-11-01 NOTE — Progress Notes (Signed)
Remote pacemaker transmission.   

## 2022-11-21 ENCOUNTER — Other Ambulatory Visit: Payer: Self-pay | Admitting: Internal Medicine

## 2022-11-26 ENCOUNTER — Other Ambulatory Visit: Payer: Self-pay | Admitting: Internal Medicine

## 2022-11-28 ENCOUNTER — Encounter: Payer: Self-pay | Admitting: Internal Medicine

## 2022-11-28 ENCOUNTER — Ambulatory Visit: Payer: Medicare PPO | Admitting: Internal Medicine

## 2022-11-28 VITALS — BP 118/86 | HR 65 | Ht 59.0 in | Wt 159.0 lb

## 2022-11-28 DIAGNOSIS — K449 Diaphragmatic hernia without obstruction or gangrene: Secondary | ICD-10-CM

## 2022-11-28 DIAGNOSIS — K219 Gastro-esophageal reflux disease without esophagitis: Secondary | ICD-10-CM | POA: Diagnosis not present

## 2022-11-28 MED ORDER — FAMOTIDINE 40 MG PO TABS: ORAL_TABLET | ORAL | 3 refills | Status: DC

## 2022-11-28 MED ORDER — ESOMEPRAZOLE MAGNESIUM 40 MG PO CPDR: DELAYED_RELEASE_CAPSULE | ORAL | 3 refills | Status: DC

## 2022-11-28 NOTE — Patient Instructions (Signed)
Your medicines have been refilled and you can get Dr Yong Channel to refill in the future for you.   I appreciate the opportunity to care for you. Silvano Rusk, MD, [facility]

## 2022-11-28 NOTE — Progress Notes (Signed)
Kylie Patrick 87 y.o. [birth year] [id number]  Assessment & Plan:   Encounter Diagnoses  Name Primary?   Gastroesophageal reflux disease, unspecified whether esophagitis present Yes   Hiatal hernia      Continue current regimen with twice daily PPI and bedtime H2 blocker.  I think she can get her prescription refilled through primary care if not I can see her back but that is what we are planning on and she can see me as needed for signs and symptoms that warrant GI evaluation.  Subjective:   Chief Complaint: Follow-up of GERD  HPI 87 year old white woman with history of GERD and hiatal hernia, status post fundoplication procedure with slipped repair here for follow-up.  Last EGD in June 2018, she was having dysphagia type issues and regurgitation problems.  In 2021 she followed up she was still having some problems and I recommended raising head of the bed and lifestyle precautions with avoiding late night meals and we added famotidine 40 mg at bedtime to twice daily generic Nexium.  Since that time she has done quite well with 1 episode of regurgitation a week.  No complaints of dysphagia.  She had some fecal incontinence for a few months back at that last visit but that has spontaneously resolved.  Lost her husband in August, she is grieving but coping well.  Wt Readings from Last 3 Encounters:  11/28/22 159 lb (72.1 kg)  10/01/22 154 lb 3.2 oz (69.9 kg)  09/20/22 156 lb (70.8 kg)    Allergies  Allergen Reactions   Chlorthalidone     01/09/13 creatinine 2.2   Hydrochlorothiazide     Hypokalemia   Losartan Potassium-Hctz     Hypokalemia   Norvasc [Amlodipine Besylate]     Edema   Raloxifene     HTN   Rofecoxib     HTN   Current Meds  Medication Sig   ALPRAZolam (XANAX) 0.25 MG tablet Take 1 tablet (0.25 mg total) by mouth 2 (two) times daily as needed for anxiety.   Calcium in Bone Mineral Cmplx 350 MG MISC Take 3 each by mouth at bedtime. Takes 1 in the morning and  2 at night   carvedilol (COREG) 6.25 MG tablet TAKE 1 TABLET BY MOUTH 2 TIMES DAILY WITH A MEAL.   Cholecalciferol (VITAMIN D3) 2000 UNITS TABS Take 1 tablet by mouth daily with lunch.   ezetimibe-simvastatin (VYTORIN) 10-20 MG tablet TAKE 1 TABLET BY MOUTH EVERYDAY AT BEDTIME   folic acid (FOLVITE) 381 MCG tablet Take 400 mcg by mouth every morning.   furosemide (LASIX) 40 MG tablet Take 1 tablet (40 mg total) by mouth daily.   gabapentin (NEURONTIN) 100 MG capsule Take 1 capsule (100 mg total) by mouth at bedtime. (Patient taking differently: Take 100 mg by mouth as needed.)   KLOR-CON M20 20 MEQ tablet TAKE 1 TABLET (20 MEQ TOTAL) BY MOUTH DAILY AS NEEDED.   losartan (COZAAR) 25 MG tablet TAKE 1 TABLET (25 MG TOTAL) BY MOUTH DAILY.   Magnesium 400 MG CAPS Take 400 mg by mouth at bedtime.   Omega-3 Fatty Acids (FISH OIL) 500 MG CAPS Take 500 mg by mouth daily with lunch.   [DISCONTINUED] esomeprazole (NEXIUM) 40 MG capsule TAKE 1 CAPSULE BY MOUTH TWICE A DAY BEFORE A MEAL   [DISCONTINUED] famotidine (PEPCID) 40 MG tablet TAKE 1 TABLET BY MOUTH EVERYDAY AT BEDTIME   Past Medical History:  Diagnosis Date   Anxiety    Colon polyp 2003  Complication of anesthesia    slow to wake up one time   Gastroenteritis    w/ renal insufficiency in context of protracted nausea & vomitting 2006   GERD (gastroesophageal reflux disease)    H/O hiatal hernia    Headache(784.0)    occasional lifelong- gabapentin helps a   Hiatal hernia    Hyperlipidemia    Hypertension    w/ LVH on ECHO   Pacemaker 07/2016   Tonsillitis 2006   Past Surgical History:  Procedure Laterality Date   ABDOMINAL HYSTERECTOMY  1976   BSO for endometriosis and bengin tumor   APPENDECTOMY  1970   BREAST LUMPECTOMY Left 70's   X 2   BUNIONECTOMY Bilateral    CATARACT EXTRACTION W/PHACO Left 05/14/2013   Procedure: CATARACT EXTRACTION PHACO AND INTRAOCULAR LENS PLACEMENT ([place]);  Surgeon: Tonny Branch, MD;  Location: [facility];   Service: Ophthalmology;  Laterality: Left;  CDE:9.76   CATARACT EXTRACTION W/PHACO Right 06/08/2013   Procedure: CATARACT EXTRACTION PHACO AND INTRAOCULAR LENS PLACEMENT ([facility]);  Surgeon: Tonny Branch, MD;  Location: [facility];  Service: Ophthalmology;  Laterality: Right;  CDE 13.65   CHOLECYSTECTOMY  1970   COLONOSCOPY W/ POLYPECTOMY  2003   Dr [name]   EP IMPLANTABLE DEVICE N/A 07/18/2016   Procedure: Pacemaker Implant;  Surgeon: Deboraha Sprang, MD;  Location: [place] [facility];  Service: Cardiovascular;  Laterality: N/A;   ESOPHAGEAL MANOMETRY N/A 10/05/2013   Procedure: ESOPHAGEAL MANOMETRY (EM);  Surgeon: Gatha Mayer, MD;  Location: [facility];  Service: Endoscopy;  Laterality: N/A;   ESOPHAGOGASTRODUODENOSCOPY  02/05/13   Large Hiatal Hernia   HERNIA REPAIR     LAPAROSCOPIC NISSEN FUNDOPLICATION Bilateral 40/06/6760   Procedure: LAPAROSCOPIC NISSEN FUNDOPLICATION with hiatal hernia repair ;  Surgeon: Edward Jolly, MD;  Location: [facility];  Service: General;  Laterality: Bilateral;   Social History   Social History Narrative   Lives alone, husband died July 21, 2023   Right Handed   Drinks caffeine rarely   family history includes Aneurysm in her paternal grandmother; Breast cancer in her maternal aunt; Coronary artery disease in her maternal grandfather and mother; Diabetes in her maternal aunt and mother; Heart attack (age of onset: 41) in her father; Heart disease in her sister; Stomach cancer in her maternal aunt; Stroke in her maternal grandmother and paternal grandfather.   Review of Systems As per HPI  Objective:   Physical Exam BP 118/86   Pulse 65   Ht '4\' 11"'$  (1.499 m)   Wt 159 lb (72.1 kg)   BMI 32.11 kg/m

## 2022-12-09 ENCOUNTER — Other Ambulatory Visit: Payer: Self-pay | Admitting: Family Medicine

## 2023-01-01 ENCOUNTER — Other Ambulatory Visit: Payer: Self-pay | Admitting: Family Medicine

## 2023-01-08 NOTE — Progress Notes (Unsigned)
   I, Peterson Lombard, LAT, ATC acting as a scribe for Lynne Leader, MD.  Kylie Patrick is a 87 y.o. female who presents to [facility] today for R wrist pain. Pt was last seen by Dr. Georgina Snell on 10/04/21 for R CTS and L index finger and L 2nd MCPJ pain. Today, pt reports R wrist pain x ***. Pt locates pain to **  Grip strength: Paresthesia: Aggravates: Treatments tried:   Pertinent review of systems: ***  Relevant historical information: ***   Exam:  There were no vitals taken for this visit. General: Well Developed, well nourished, and in no acute distress.   MSK: ***    Lab and Radiology Results No results found for this or any previous visit (from the past 72 hour(s)). No results found.     Assessment and Plan: 87 y.o. female with ***   PDMP not reviewed this encounter. No orders of the defined types were placed in this encounter.  No orders of the defined types were placed in this encounter.    Discussed warning signs or symptoms. Please see discharge instructions. Patient expresses understanding.   ***

## 2023-01-09 ENCOUNTER — Ambulatory Visit: Payer: Self-pay

## 2023-01-09 ENCOUNTER — Ambulatory Visit: Payer: Medicare PPO | Admitting: Family Medicine

## 2023-01-09 ENCOUNTER — Encounter: Payer: Self-pay | Admitting: Family Medicine

## 2023-01-09 VITALS — BP 110/80 | HR 74 | Ht 59.0 in | Wt 156.0 lb

## 2023-01-09 DIAGNOSIS — G5601 Carpal tunnel syndrome, right upper limb: Secondary | ICD-10-CM

## 2023-01-09 NOTE — Patient Instructions (Signed)
Thank you for coming in today.   Continue the night splint.   Ok to use some of the gabapentin at night as needed for nerve pain or annoying nerve symptoms.   Recheck in 3 months.   More tests to do.   If you are totally better in 3 months ok to cancel the visit.

## 2023-01-10 ENCOUNTER — Ambulatory Visit (INDEPENDENT_AMBULATORY_CARE_PROVIDER_SITE_OTHER): Payer: Medicare PPO

## 2023-01-10 DIAGNOSIS — I442 Atrioventricular block, complete: Secondary | ICD-10-CM

## 2023-01-14 ENCOUNTER — Encounter: Payer: Self-pay | Admitting: Family Medicine

## 2023-01-14 ENCOUNTER — Ambulatory Visit (INDEPENDENT_AMBULATORY_CARE_PROVIDER_SITE_OTHER): Payer: Medicare PPO | Admitting: Family Medicine

## 2023-01-14 VITALS — BP 112/78 | HR 84 | Temp 97.9°F | Ht 59.0 in | Wt 159.4 lb

## 2023-01-14 DIAGNOSIS — R059 Cough, unspecified: Secondary | ICD-10-CM | POA: Diagnosis not present

## 2023-01-14 DIAGNOSIS — I10 Essential (primary) hypertension: Secondary | ICD-10-CM

## 2023-01-14 DIAGNOSIS — R0981 Nasal congestion: Secondary | ICD-10-CM

## 2023-01-14 DIAGNOSIS — J3489 Other specified disorders of nose and nasal sinuses: Secondary | ICD-10-CM

## 2023-01-14 LAB — POC COVID19 BINAXNOW: SARS Coronavirus 2 Ag: NEGATIVE

## 2023-01-14 MED ORDER — BENZONATATE 100 MG PO CAPS: 100.0000 mg | ORAL_CAPSULE | Freq: Two times a day (BID) | ORAL | 0 refills | Status: DC | PRN

## 2023-01-14 MED ORDER — AMOXICILLIN-POT CLAVULANATE 875-125 MG PO TABS: 1.0000 | ORAL_TABLET | Freq: Two times a day (BID) | ORAL | 0 refills | Status: AC

## 2023-01-14 NOTE — Progress Notes (Signed)
Phone [phone] In person visit   Subjective:   Kylie Patrick is a 87 y.o. year old very pleasant female patient who presents for/with See problem oriented charting Chief Complaint  Patient presents with   Cough    (Masked) Pt c/o bad cough and nasal congestion that started Friday night and she is concerned about bronchitis.   Past Medical History-  Patient Active Problem List   Diagnosis Date Noted   Atrial fibrillation ([place]) -SCAF 08/14/2022    Priority: High   Pacemaker - MDT 08/11/2020    Priority: High   Complete heart block ([place]) 04/05/2020    Priority: High   Aortic atherosclerosis ([place]) 04/26/2020    Priority: Medium    Osteoporosis 04/25/2020    Priority: Medium    Carpal tunnel syndrome, right 05/02/2019    Priority: Medium    Diastolic dysfunction 123456    Priority: Medium    Hyperglycemia 10/09/2014    Priority: Medium    GERD (gastroesophageal reflux disease) 10/22/2013    Priority: Medium    CKD (chronic kidney disease) stage 3, GFR 30-59 ml/min ([place]) 05/05/2008    Priority: Medium    Anxiety state 04/20/2008    Priority: Medium    HYPERLIPIDEMIA 08/12/2007    Priority: Medium    Essential hypertension 08/12/2007    Priority: Medium    Rash 07/04/2017    Priority: Low   Hiatal hernia 08/03/2013    Priority: Low   Personal history of colonic polyps 01/09/2013    Priority: Low   NIGHT SWEATS 02/19/2008    Priority: Low   Exercise intolerance 08/14/2022    Medications- reviewed and updated Current Outpatient Medications  Medication Sig Dispense Refill   ALPRAZolam (XANAX) 0.25 MG tablet Take 1 tablet (0.25 mg total) by mouth 2 (two) times daily as needed for anxiety. 30 tablet 5   amoxicillin-clavulanate (AUGMENTIN) 875-125 MG tablet Take 1 tablet by mouth 2 (two) times daily for 7 days. If symptoms last 10 days or if worsen sooner than that 14 tablet 0   benzonatate (TESSALON) 100 MG capsule Take 1 capsule (100 mg total) by mouth 2 (two)  times daily as needed for cough. 20 capsule 0   Calcium in Bone Mineral Cmplx 350 MG MISC Take 3 each by mouth at bedtime. Takes 1 in the morning and 2 at night     carvedilol (COREG) 6.25 MG tablet TAKE 1 TABLET BY MOUTH 2 TIMES DAILY WITH A MEAL. 180 tablet 3   Cholecalciferol (VITAMIN D3) 2000 UNITS TABS Take 1 tablet by mouth daily with lunch.     esomeprazole (NEXIUM) 40 MG capsule TAKE 1 CAPSULE BY MOUTH TWICE A DAY BEFORE A MEAL 180 capsule 3   ezetimibe-simvastatin (VYTORIN) 10-20 MG tablet TAKE 1 TABLET BY MOUTH EVERYDAY AT BEDTIME 90 tablet 2   famotidine (PEPCID) 40 MG tablet TAKE 1 TABLET BY MOUTH EVERYDAY AT BEDTIME 90 tablet 3   folic acid (FOLVITE) A999333 MCG tablet Take 400 mcg by mouth every morning.     furosemide (LASIX) 40 MG tablet TAKE 1 TABLET BY MOUTH EVERY DAY 90 tablet 3   gabapentin (NEURONTIN) 100 MG capsule Take 1 capsule (100 mg total) by mouth at bedtime. (Patient taking differently: Take 100 mg by mouth as needed.) 90 capsule 3   KLOR-CON M20 20 MEQ tablet TAKE 1 TABLET (20 MEQ TOTAL) BY MOUTH DAILY AS NEEDED. 90 tablet 1   losartan (COZAAR) 25 MG tablet TAKE 1 TABLET (25 MG TOTAL)  BY MOUTH DAILY. 90 tablet 3   Magnesium 400 MG CAPS Take 400 mg by mouth at bedtime.     Omega-3 Fatty Acids (FISH OIL) 500 MG CAPS Take 500 mg by mouth daily with lunch.     No current facility-administered medications for this visit.     Objective:  BP 112/78   Pulse 84   Temp 97.9 F (36.6 C)   Ht '4\' 11"'$  (1.499 m)   Wt 159 lb 6.4 oz (72.3 kg)   SpO2 98%   BMI 32.19 kg/m  Gen: NAD, resting comfortably Edematous nasal turbinates without discharge, sinus pressure- maxillary and frontal, tympanic membrane normal bilaterally  CV: RRR no murmurs rubs or gallops Lungs: CTAB no crackles, wheeze, rhonchi Ext: no edema Skin: warm, dry  Results for orders placed or performed in visit on 01/14/23 (from the past 24 hour(s))  POC COVID-19     Status: None   Collection Time: 01/14/23   8:57 AM  Result Value Ref Range   SARS Coronavirus 2 Ag Negative Negative       Assessment and Plan   # cough/congestion S:patient with bad cough (sometimes coughs so long causes chest tenderness) and nasal congestion (stopped up but gets some clear drainage) that started Friday night 01/11/23 . Some runny eyes.  Has tried tylenol for sinus pressure. Denies body aches. No fever. Feels winded some. Has not tried cough medicine- does not do well with antihistamines  -reports had bronchitis a year ago or so and this feels similar A/P: Cough/sinus congestion and pressure -suspect sinusitis -if symptoms last over 10 days or if they worsen at this point- can go ahead and take augmentin- she wanted to have on hand Ipick up today) as would be harder to get out if feels worse as lives alone. These would be indications for bacterial sinusitis- though likely viral at this point -  tessalon for cough- if this is very expensive could try mucinex-D M over the counter. If can get tessalon but wants something to loose congestion- can get plain mucinex -covid test negative today -she knows to let us know if worsening symptoms or failure to improve despite treatment  #hypertension S: medication:  Carvedilol 6.25 mg BID , Lasix '40mg'$  daily, losartan '25mg'$  (reduced by Dr. Moshe Cipro from '100mg'$  originally to 25 mg) BP Readings from Last 3 Encounters:  01/14/23 112/78  01/09/23 110/80  11/28/22 118/86  A/P:stable- continue current medicines - at goal on repeat    Recommended follow up: Return for as needed for new, worsening, persistent symptoms. Future Appointments  Date Time Provider [place]  02/18/2023  9:15 AM Alric Ran, MD [facility] None  04/10/2023 11:30 AM Gregor Hams, MD [facility] None  04/11/2023  8:40 AM [facility] DEVICE REMOTES [facility]  04/12/2023  9:00 AM Marin Olp, MD [facility]  05/17/2023  1:45 PM Deboraha Sprang, MD [facility]     Lab/Order associations:   ICD-10-CM   1. Cough, unspecified type  R05.9 POC COVID-19    2. Nasal congestion  R09.81     3. Sinus pressure  J34.89     4. Essential hypertension  I10       Meds ordered this encounter  Medications   benzonatate (TESSALON) 100 MG capsule    Sig: Take 1 capsule (100 mg total) by mouth 2 (two) times daily as needed for cough.    Dispense:  20 capsule    Refill:  0   amoxicillin-clavulanate (AUGMENTIN) 875-125  MG tablet    Sig: Take 1 tablet by mouth 2 (two) times daily for 7 days. If symptoms last 10 days or if worsen sooner than that    Dispense:  14 tablet    Refill:  0    Return precautions advised.  Garret Reddish, MD

## 2023-01-14 NOTE — Patient Instructions (Addendum)
Cough/sinus congestion and pressure -suspect sinusitis -if symptoms last over 10 days or if they worsen at this point- can go ahead and take augmentin- she wanted to have on hand Ipick up today) as would be harder to get out if feels worse as lives alone -  tessalon for cough- if this is very expensive could try mucinex-D M over the counter. If can get tessalon but wants something to loose congestion- can get plain mucinex -covid test negative today -she knows to let us know if worsening symptoms or failure to improve despite treatment  Recommended follow up: Return for as needed for new, worsening, persistent symptoms.

## 2023-01-15 LAB — CUP PACEART REMOTE DEVICE CHECK
Battery Remaining Longevity: 13 mo
Battery Voltage: 2.9 V
Brady Statistic AP VP Percent: 20.47 %
Brady Statistic AP VS Percent: 0 %
Brady Statistic AS VP Percent: 79.53 %
Brady Statistic AS VS Percent: 0 %
Brady Statistic RA Percent Paced: 20.46 %
Brady Statistic RV Percent Paced: 99.99 %
Date Time Interrogation Session: 20240304142714
Implantable Lead Connection Status: 753985
Implantable Lead Connection Status: 753985
Implantable Lead Implant Date: 20170906
Implantable Lead Implant Date: 20170906
Implantable Lead Location: 753859
Implantable Lead Location: 753860
Implantable Lead Model: 3830
Implantable Lead Model: 5076
Implantable Pulse Generator Implant Date: 20170906
Lead Channel Impedance Value: 304 Ohm
Lead Channel Impedance Value: 418 Ohm
Lead Channel Impedance Value: 456 Ohm
Lead Channel Impedance Value: 494 Ohm
Lead Channel Pacing Threshold Amplitude: 0.5 V
Lead Channel Pacing Threshold Amplitude: 1.5 V
Lead Channel Pacing Threshold Pulse Width: 0.4 ms
Lead Channel Pacing Threshold Pulse Width: 0.4 ms
Lead Channel Sensing Intrinsic Amplitude: 1.5 mV
Lead Channel Sensing Intrinsic Amplitude: 1.5 mV
Lead Channel Sensing Intrinsic Amplitude: 4.625 mV
Lead Channel Sensing Intrinsic Amplitude: 4.625 mV
Lead Channel Setting Pacing Amplitude: 2 V
Lead Channel Setting Pacing Amplitude: 2.5 V
Lead Channel Setting Pacing Pulse Width: 1 ms
Lead Channel Setting Sensing Sensitivity: 0.9 mV
Zone Setting Status: 755011
Zone Setting Status: 755011

## 2023-02-08 NOTE — Progress Notes (Signed)
Remote pacemaker transmission.   

## 2023-02-18 ENCOUNTER — Ambulatory Visit: Payer: Medicare PPO | Admitting: Neurology

## 2023-04-10 ENCOUNTER — Ambulatory Visit: Payer: Medicare PPO | Admitting: Family Medicine

## 2023-04-11 ENCOUNTER — Ambulatory Visit (INDEPENDENT_AMBULATORY_CARE_PROVIDER_SITE_OTHER): Payer: Medicare PPO

## 2023-04-11 DIAGNOSIS — I442 Atrioventricular block, complete: Secondary | ICD-10-CM

## 2023-04-12 ENCOUNTER — Ambulatory Visit (INDEPENDENT_AMBULATORY_CARE_PROVIDER_SITE_OTHER): Payer: Medicare PPO | Admitting: Family Medicine

## 2023-04-12 ENCOUNTER — Encounter: Payer: Self-pay | Admitting: Family Medicine

## 2023-04-12 VITALS — BP 114/76 | HR 77 | Temp 98.2°F | Resp 16 | Ht 59.0 in | Wt 156.4 lb

## 2023-04-12 DIAGNOSIS — Z131 Encounter for screening for diabetes mellitus: Secondary | ICD-10-CM | POA: Diagnosis not present

## 2023-04-12 DIAGNOSIS — R739 Hyperglycemia, unspecified: Secondary | ICD-10-CM

## 2023-04-12 DIAGNOSIS — N183 Chronic kidney disease, stage 3 unspecified: Secondary | ICD-10-CM | POA: Diagnosis not present

## 2023-04-12 DIAGNOSIS — I10 Essential (primary) hypertension: Secondary | ICD-10-CM

## 2023-04-12 DIAGNOSIS — Z0000 Encounter for general adult medical examination without abnormal findings: Secondary | ICD-10-CM

## 2023-04-12 DIAGNOSIS — R200 Anesthesia of skin: Secondary | ICD-10-CM

## 2023-04-12 DIAGNOSIS — E782 Mixed hyperlipidemia: Secondary | ICD-10-CM | POA: Diagnosis not present

## 2023-04-12 DIAGNOSIS — R0609 Other forms of dyspnea: Secondary | ICD-10-CM | POA: Diagnosis not present

## 2023-04-12 DIAGNOSIS — I700 Atherosclerosis of aorta: Secondary | ICD-10-CM

## 2023-04-12 DIAGNOSIS — I442 Atrioventricular block, complete: Secondary | ICD-10-CM | POA: Diagnosis not present

## 2023-04-12 DIAGNOSIS — I4891 Unspecified atrial fibrillation: Secondary | ICD-10-CM | POA: Diagnosis not present

## 2023-04-12 LAB — COMPREHENSIVE METABOLIC PANEL
ALT: 13 U/L (ref 0–35)
AST: 22 U/L (ref 0–37)
Albumin: 4 g/dL (ref 3.5–5.2)
Alkaline Phosphatase: 50 U/L (ref 39–117)
BUN: 22 mg/dL (ref 6–23)
CO2: 31 mEq/L (ref 19–32)
Calcium: 9.5 mg/dL (ref 8.4–10.5)
Chloride: 103 mEq/L (ref 96–112)
Creatinine, Ser: 1.77 mg/dL — ABNORMAL HIGH (ref 0.40–1.20)
GFR: 26 mL/min — ABNORMAL LOW (ref >60.00)
Glucose, Bld: 105 mg/dL — ABNORMAL HIGH (ref 70–99)
Potassium: 3.4 mEq/L — ABNORMAL LOW (ref 3.5–5.1)
Sodium: 142 mEq/L (ref 135–145)
Total Bilirubin: 0.6 mg/dL (ref 0.2–1.2)
Total Protein: 6.8 g/dL (ref 6.0–8.3)

## 2023-04-12 LAB — CUP PACEART REMOTE DEVICE CHECK
Battery Remaining Longevity: 10 mo
Battery Voltage: 2.89 V
Brady Statistic AP VP Percent: 22.65 %
Brady Statistic AP VS Percent: 0 %
Brady Statistic AS VP Percent: 77.35 %
Brady Statistic AS VS Percent: 0 %
Brady Statistic RA Percent Paced: 22.63 %
Brady Statistic RV Percent Paced: 99.99 %
Date Time Interrogation Session: 20240531110035
Implantable Lead Connection Status: 753985
Implantable Lead Connection Status: 753985
Implantable Lead Implant Date: 20170906
Implantable Lead Implant Date: 20170906
Implantable Lead Location: 753859
Implantable Lead Location: 753860
Implantable Lead Model: 3830
Implantable Lead Model: 5076
Implantable Pulse Generator Implant Date: 20170906
Lead Channel Impedance Value: 323 Ohm
Lead Channel Impedance Value: 418 Ohm
Lead Channel Impedance Value: 456 Ohm
Lead Channel Impedance Value: 532 Ohm
Lead Channel Pacing Threshold Amplitude: 0.5 V
Lead Channel Pacing Threshold Amplitude: 1.5 V
Lead Channel Pacing Threshold Pulse Width: 0.4 ms
Lead Channel Pacing Threshold Pulse Width: 0.4 ms
Lead Channel Sensing Intrinsic Amplitude: 2.25 mV
Lead Channel Sensing Intrinsic Amplitude: 2.25 mV
Lead Channel Sensing Intrinsic Amplitude: 5.625 mV
Lead Channel Sensing Intrinsic Amplitude: 5.625 mV
Lead Channel Setting Pacing Amplitude: 2 V
Lead Channel Setting Pacing Amplitude: 2.5 V
Lead Channel Setting Pacing Pulse Width: 1 ms
Lead Channel Setting Sensing Sensitivity: 0.9 mV
Zone Setting Status: 755011
Zone Setting Status: 755011

## 2023-04-12 LAB — LIPID PANEL
Cholesterol: 155 mg/dL (ref 0–200)
HDL: 50.6 mg/dL (ref >39.00)
LDL Cholesterol: 81 mg/dL (ref 0–99)
NonHDL: 104.17
Total CHOL/HDL Ratio: 3
Triglycerides: 116 mg/dL (ref 0.0–149.0)
VLDL: 23.2 mg/dL (ref 0.0–40.0)

## 2023-04-12 LAB — CBC WITH DIFFERENTIAL/PLATELET
Basophils Absolute: 0.1 10*3/uL (ref 0.0–0.1)
Basophils Relative: 1.1 % (ref 0.0–3.0)
Eosinophils Absolute: 0.2 10*3/uL (ref 0.0–0.7)
Eosinophils Relative: 2.9 % (ref 0.0–5.0)
HCT: 39.9 % (ref 36.0–46.0)
Hemoglobin: 12.7 g/dL (ref 12.0–15.0)
Lymphocytes Relative: 26.4 % (ref 12.0–46.0)
Lymphs Abs: 1.9 10*3/uL (ref 0.7–4.0)
MCHC: 31.8 g/dL (ref 30.0–36.0)
MCV: 91.5 fl (ref 78.0–100.0)
Monocytes Absolute: 0.8 10*3/uL (ref 0.1–1.0)
Monocytes Relative: 11.2 % (ref 3.0–12.0)
Neutro Abs: 4.2 10*3/uL (ref 1.4–7.7)
Neutrophils Relative %: 58.4 % (ref 43.0–77.0)
Platelets: 239 10*3/uL (ref 150.0–400.0)
RBC: 4.36 Mil/uL (ref 3.87–5.11)
RDW: 14.8 % (ref 11.5–15.5)
WBC: 7.1 10*3/uL (ref 4.0–10.5)

## 2023-04-12 LAB — TSH: TSH: 1.8 u[IU]/mL (ref 0.35–5.50)

## 2023-04-12 LAB — HEMOGLOBIN A1C: Hgb A1c MFr Bld: 5.7 % (ref 4.6–6.5)

## 2023-04-12 LAB — VITAMIN B12: Vitamin B-12: 366 pg/mL (ref 211–911)

## 2023-04-12 MED ORDER — ESOMEPRAZOLE MAGNESIUM 40 MG PO CPDR: DELAYED_RELEASE_CAPSULE | ORAL | 3 refills | Status: DC

## 2023-04-12 MED ORDER — GABAPENTIN 100 MG PO CAPS: 100.0000 mg | ORAL_CAPSULE | Freq: Every day | ORAL | 3 refills | Status: DC

## 2023-04-12 NOTE — Progress Notes (Signed)
Phone [phone]   Subjective:  Patient presents today for their annual physical. Chief complaint-noted.   See problem oriented charting- ROS- full  review of systems was completed and negative except for: seasonal allergies, numbness in feet, stable shortness of breath over several years and fatigue  The following were reviewed and entered/updated in epic: Past Medical History:  Diagnosis Date   Anxiety    Colon polyp 2003   Complication of anesthesia    slow to wake up one time   Gastroenteritis    w/ renal insufficiency in context of protracted nausea & vomitting 2006   GERD (gastroesophageal reflux disease)    H/O hiatal hernia    Headache(784.0)    occasional lifelong- gabapentin helps a   Hiatal hernia    Hyperlipidemia    Hypertension    w/ LVH on ECHO   Pacemaker 07/2016   Tonsillitis 2006   Patient Active Problem List   Diagnosis Date Noted   Atrial fibrillation (HCC) -SCAF 08/14/2022    Priority: High   Pacemaker - MDT 08/11/2020    Priority: High   Complete heart block (HCC) 04/05/2020    Priority: High   Aortic atherosclerosis (HCC) 04/26/2020    Priority: Medium    Osteoporosis 04/25/2020    Priority: Medium    Carpal tunnel syndrome, right 05/02/2019    Priority: Medium    Diastolic dysfunction 07/12/2016    Priority: Medium    Hyperglycemia 10/09/2014    Priority: Medium    GERD (gastroesophageal reflux disease) 10/22/2013    Priority: Medium    CKD (chronic kidney disease) stage 3, GFR 30-59 ml/min (HCC) 05/05/2008    Priority: Medium    Anxiety state 04/20/2008    Priority: Medium    HYPERLIPIDEMIA 08/12/2007    Priority: Medium    Essential hypertension 08/12/2007    Priority: Medium    Rash 07/04/2017    Priority: Low   Hiatal hernia 08/03/2013    Priority: Low   Personal history of colonic polyps 01/09/2013    Priority: Low   NIGHT SWEATS 02/19/2008    Priority: Low   Exercise intolerance 08/14/2022   Past Surgical History:   Procedure Laterality Date   ABDOMINAL HYSTERECTOMY  1976   BSO for endometriosis and bengin tumor   APPENDECTOMY  1970   BREAST LUMPECTOMY Left 70's   X 2   BUNIONECTOMY Bilateral    CATARACT EXTRACTION W/PHACO Left 05/14/2013   Procedure: CATARACT EXTRACTION PHACO AND INTRAOCULAR LENS PLACEMENT (IOC);  Surgeon: Gemma Payor, MD;  Location: [facility];  Service: Ophthalmology;  Laterality: Left;  CDE:9.76   CATARACT EXTRACTION W/PHACO Right 06/08/2013   Procedure: CATARACT EXTRACTION PHACO AND INTRAOCULAR LENS PLACEMENT (IOC);  Surgeon: Gemma Payor, MD;  Location: [facility];  Service: Ophthalmology;  Laterality: Right;  CDE 13.65   CHOLECYSTECTOMY  1970   COLONOSCOPY W/ POLYPECTOMY  2003   Dr Leone Payor   EP IMPLANTABLE DEVICE N/A 07/18/2016   Procedure: Pacemaker Implant;  Surgeon: [facility] Salvia, MD;  Location: [facility] INVASIVE CV LAB;  Service: Cardiovascular;  Laterality: N/A;   ESOPHAGEAL MANOMETRY N/A 10/05/2013   Procedure: ESOPHAGEAL MANOMETRY (EM);  Surgeon: Iva Boop, MD;  Location: [facility];  Service: Endoscopy;  Laterality: N/A;   ESOPHAGOGASTRODUODENOSCOPY  02/05/13   Large Hiatal Hernia   HERNIA REPAIR     LAPAROSCOPIC NISSEN FUNDOPLICATION Bilateral 10/22/2013   Procedure: LAPAROSCOPIC NISSEN FUNDOPLICATION with hiatal hernia repair ;  Surgeon: Mariella Saa, MD;  Location: [facility];  Service: General;  Laterality: Bilateral;    Family History  Problem Relation Age of Onset   Heart attack Father 27       fatal MI @ 19   Coronary artery disease Mother        CABG; MI @ 53   Diabetes Mother    Stroke Paternal Grandfather        > 58   Stroke Maternal Grandmother        in 52s   Aneurysm Paternal Grandmother        cns ; in 70s   Diabetes Maternal Aunt    Stomach cancer Maternal Aunt    Breast cancer Maternal Aunt    Heart disease Sister    Coronary artery disease Maternal Grandfather    Colon cancer Neg Hx    Esophageal cancer Neg Hx     Medications- reviewed and  updated Current Outpatient Medications  Medication Sig Dispense Refill   ALPRAZolam (XANAX) 0.25 MG tablet Take 1 tablet (0.25 mg total) by mouth 2 (two) times daily as needed for anxiety. 30 tablet 5   Calcium in Bone Mineral Cmplx 350 MG MISC Take 3 each by mouth at bedtime. Takes 1 in the morning and 2 at night     carvedilol (COREG) 6.25 MG tablet TAKE 1 TABLET BY MOUTH 2 TIMES DAILY WITH A MEAL. 180 tablet 3   Cholecalciferol (VITAMIN D3) 2000 UNITS TABS Take 1 tablet by mouth daily with lunch.     ezetimibe-simvastatin (VYTORIN) 10-20 MG tablet TAKE 1 TABLET BY MOUTH EVERYDAY AT BEDTIME 90 tablet 2   famotidine (PEPCID) 40 MG tablet TAKE 1 TABLET BY MOUTH EVERYDAY AT BEDTIME 90 tablet 3   folic acid (FOLVITE) 400 MCG tablet Take 400 mcg by mouth every morning.     furosemide (LASIX) 40 MG tablet TAKE 1 TABLET BY MOUTH EVERY DAY 90 tablet 3   KLOR-CON M20 20 MEQ tablet TAKE 1 TABLET (20 MEQ TOTAL) BY MOUTH DAILY AS NEEDED. 90 tablet 1   losartan (COZAAR) 25 MG tablet TAKE 1 TABLET (25 MG TOTAL) BY MOUTH DAILY. 90 tablet 3   Magnesium 400 MG CAPS Take 400 mg by mouth at bedtime.     Omega-3 Fatty Acids (FISH OIL) 500 MG CAPS Take 500 mg by mouth daily with lunch.     esomeprazole (NEXIUM) 40 MG capsule TAKE 1 CAPSULE BY MOUTH TWICE A DAY BEFORE A MEAL 180 capsule 3   gabapentin (NEURONTIN) 100 MG capsule Take 1 capsule (100 mg total) by mouth at bedtime. 90 capsule 3   No current facility-administered medications for this visit.    Allergies-reviewed and updated Allergies  Allergen Reactions   Chlorthalidone     01/09/13 creatinine 2.2   Hydrochlorothiazide     Hypokalemia   Losartan Potassium-Hctz     Hypokalemia   Norvasc [Amlodipine Besylate]     Edema   Raloxifene     HTN   Rofecoxib     HTN    Social History   Social History Narrative   Lives alone, husband died Jul 11, 2023   Right Handed   Drinks caffeine rarely   Objective  Objective:  BP 114/76   Pulse 77   Temp  98.2 F (36.8 C) (Temporal)   Resp 16   Ht 4\' 11"  (1.499 m)   Wt 156 lb 6.4 oz (70.9 kg)   SpO2 96%   BMI 31.59 kg/m  Gen: NAD, resting comfortably HEENT: Mucous membranes are moist. Oropharynx  normal Neck: no thyromegaly CV: RRR no murmurs rubs or gallops Lungs: CTAB no crackles, wheeze, rhonchi Abdomen: soft/nontender/nondistended/normal bowel sounds. No rebound or guarding.  Ext: trace edema Skin: warm, dry Neuro: grossly normal, moves all extremities, PERRLA   Assessment and Plan   87 y.o. female presenting for annual physical.  Health Maintenance counseling: 1. Anticipatory guidance: Patient counseled regarding regular dental exams -q6 months, eye exams -yearly,  avoiding smoking and second hand smoke , limiting alcohol to 1 beverage per day- very sparing glass of wine- over a year , no illicit drugs .   2. Risk factor reduction:  Advised patient of need for regular exercise and diet rich and fruits and vegetables to reduce risk of heart attack and stroke.  Exercise- struggling to walk with feet pain- ideally would restart with osteoporosis- inactivity likely contributes to shortness of breath/dyspnea on exertion - does better with rest.  Diet/weight management-within 1 pounds sof last year- feels could improve food choices.  Wt Readings from Last 3 Encounters:  04/12/23 156 lb 6.4 oz (70.9 kg)  01/14/23 159 lb 6.4 oz (72.3 kg)  01/09/23 156 lb (70.8 kg)  3. Immunizations/screenings/ancillary studies- opts out of COVID shots- husband died after COVID even with all his shots so this has been hard on her Immunization History  Administered Date(s) Administered   Fluad Quad(high Dose 65+) 07/21/2019, 08/30/2020, 08/01/2022   Influenza Split 10/08/2011   Influenza Whole 09/06/2009, 10/02/2010   Influenza, High Dose Seasonal PF 09/10/2013, 10/10/2015, 10/10/2016, 10/24/2017, 08/18/2018, 07/28/2019, 08/15/2021   Influenza,inj,Quad PF,6+ Mos 10/04/2014   Influenza-Unspecified  08/18/2018   PFIZER(Purple Top)SARS-COV-2 Vaccination 12/17/2019, 01/07/2020, 08/18/2020   Pneumococcal Conjugate-13 10/25/2015   Pneumococcal Polysaccharide-23 10/10/2010   Tdap 10/10/2016   Zoster Recombinat (Shingrix) 07/21/2019, 09/13/2019, 10/16/2019   Zoster, Live 01/09/2013  4. Cervical cancer screening- past age based screening  recommendations  5. Breast cancer screening-  past age based screening recommendations 6. Colon cancer screening - past age based screening recommendations 7. Skin cancer screening- not recently. advised regular sunscreen use. Denies worrisome, changing, or new skin lesions.  8. Birth control/STD check- not active/not dating 20. Osteoporosis screening at 65- see below 10. Smoking associated screening - never smoker  Status of chronic or acute concerns   # Social update- lives by herself. lost husband Chanetta Marshall August 2023. Spending more time with son and daughter in law  (GBS) and finding that helpful- spending time with great granddaughter 69 as well  # mild shortness of breath - at least 2 years of issues- we do not have a CXR in that time frame. Dr. Turner Daniels basically noted exercise intolerance- she does better with rest- she has upcoming visit- we did opt to at least get a chest x-ray    % complete heart block- Led to pacemaker placement - follows with Dr. Graciela Husbands yearly. q3 month checks at home   #A fib- subclinical- cardiology monitoring- has not recommended anticoagulation but is on carvedilol. Appropriately rate controlled- continue current medications - no anticoagulation per cards  #hypertension S: medication:  Carvedilol 6.25 mg BID , Lasix 40mg  daily, losartan 25mg  (reduced by Dr. Kathrene Bongo from 100mg  originally to 25 mg) BP Readings from Last 3 Encounters:  04/12/23 114/76  01/14/23 112/78  01/09/23 110/80  A/P:stable- continue current medicines    #hyperlipidemia/aortic atherosclerosis-LDL goal at least under 100-ideally under 70 but not  increasing medicine for primary prevention S: Medication:  fish oil , Vytorin 10-20mg  -Prior aspirin 81mg - no bleeding history and takes due  to strong family history of coronary artery disease.  Dr. Graciela Husbands stopped aspirin  08/15/2022 Lab Results  Component Value Date   CHOL 169 03/14/2022   HDL 49.50 03/14/2022   LDLCALC 91 03/14/2022   TRIG 144.0 03/14/2022   CHOLHDL 3 03/14/2022  A/P: lipids slightly above ideal goal but at her age do not want to increase medications unless we have to. Aortic atherosclerosis (presumed stable)- LDL goal ideally <70 - slightly above goal- continue current medications and update lipids   # Anxiety S:Medication:  Xanax once a week or so- sometimes doesn't need  .-No falls or injuries reported  A/P: doing reasonably well- if has falls or injuries would need to stop   #Neuropathy- has seen Dr. Teresa Coombs starting 08/10/21- taking gabapentin mainly as needed -Gabapentin 100mg  every 8 months perhaps- per neurology in past but she prefers no tto return. In regards to chief complaint litsed- actually was seeing neurology not podiatry - I refilled today - Also notes right leg pain from her lateral hip down the side of her leg does not cross the knee for most part- occasionally into latreal calf. Gabapentin does help this.    # Hyperglycemia/insulin resistance/prediabetes- peak a1c June 2020 at 6.4 S:  Medication: none Lab Results  Component Value Date   HGBA1C 6.0 10/01/2022   HGBA1C 5.8 03/14/2022   HGBA1C 5.8 10/02/2021  A/P: hopefully stable- update a1c today. Continue without meds for now   #Osteoporosis S: On April 21, 2020 patient had a bone density in the left femoral neck of -2.4 T score.  Her 10-year hip fracture risk is 6% which places her in osteoporosis range.prior in 2015 had been -1.7.  Dr. Alwyn Ren also wanted her on magnesium -She agrees to take calcium 1200 mg a day and vitamin D 800 units/day -History of severe reflux requiring high-dose PPI twice a  day -For this reason we have opted to pursue Reclast instead of Fosamax with team looking into this 04/25/20 but renal function would not tolerate -Saw [facility] and recommendation was Reclast 2.5 mg or Prolia-we have attempted several times to get her set up with Prolia- she is not sure that she would be able to consistently make it to visits - November 2023 bone density stable to slightly improved- we opted to hold off on medication beyond otc supplements   -Weightbearing exercise-encouraged her to start at May 2021 visit and again today- she has fallen off with neuropathic pain A/P: osteoporosis stable last check- taking calcium and vitamin D- also encouraged weight bearing exercise- update DEXA likely November 2025   % Chronic kidney disease Stage III-follows with Dr. Kathrene Bongo typically S: knows to avoid nsaids.  A/P: hopefully stable- update cmp today. Continue without meds for now    % GERD-released from Dr. Leone Payor in January 2024 -s/p hiatal hernia surgery- bad experience S: Nexium 40mg  BID long-term plan with H2 blocker before bed.   -Patient takes B12 on a regular basis to avoid suppression with Nexium A/P: GERD stable- continue current medicines- I refilled these -takes b complex with long term PPI  Recommended follow up: Return in about 6 months (around 10/12/2023) for followup or sooner if needed.Schedule b4 you leave. Future Appointments  Date Time Provider Department Center  05/17/2023  1:45 PM [facility] Salvia, MD [id number]   Lab/Order associations: fasting   ICD-10-CM   1. Preventative health care  Z00.00     2. Aortic atherosclerosis (HCC) Chronic I70.0     3. Complete  heart block (HCC) Chronic I44.2     4. Stage 3 chronic kidney disease, unspecified whether stage 3a or 3b CKD (HCC) Chronic N18.30     5. Atrial fibrillation, unspecified type (HCC)  I48.91     6. HYPERLIPIDEMIA  E78.2 Comprehensive metabolic panel    CBC with  Differential/Platelet    Lipid panel    TSH    7. Essential hypertension  I10     8. Hyperglycemia  R73.9 Hemoglobin A1c    9. DOE (dyspnea on exertion)  R06.09 DG Chest 2 View    10. Screening for diabetes mellitus  Z13.1 Hemoglobin A1c    11. Numbness in feet  R20.0 TSH    Vitamin B12     Return precautions advised.  Tana Conch, MD

## 2023-04-12 NOTE — Patient Instructions (Addendum)
Please stop by lab before you go If you have mychart- we will send your results within 3 business days of [place] receiving them.  If you do not have mychart- we will call you about results within 5 business days of [place] receiving them.  *please also note that you will see labs on mychart as soon as they post. I will later go in and write notes on them- will say "notes from Dr. Durene Cal"   Glad you are doing so well!  Recommended follow up: Return in about 6 months (around 10/12/2023) for followup or sooner if needed.Schedule b4 you leave.

## 2023-05-01 NOTE — Progress Notes (Signed)
Remote pacemaker transmission.   

## 2023-05-06 ENCOUNTER — Other Ambulatory Visit: Payer: Self-pay | Admitting: Family Medicine

## 2023-05-14 ENCOUNTER — Ambulatory Visit: Payer: Medicare PPO | Attending: Internal Medicine | Admitting: Internal Medicine

## 2023-05-14 ENCOUNTER — Encounter: Payer: Self-pay | Admitting: Internal Medicine

## 2023-05-14 VITALS — BP 102/64 | HR 71 | Ht 59.0 in | Wt 156.0 lb

## 2023-05-14 DIAGNOSIS — I4891 Unspecified atrial fibrillation: Secondary | ICD-10-CM | POA: Diagnosis not present

## 2023-05-14 DIAGNOSIS — Z950 Presence of cardiac pacemaker: Secondary | ICD-10-CM | POA: Diagnosis not present

## 2023-05-14 DIAGNOSIS — R6889 Other general symptoms and signs: Secondary | ICD-10-CM | POA: Diagnosis not present

## 2023-05-14 DIAGNOSIS — I442 Atrioventricular block, complete: Secondary | ICD-10-CM

## 2023-05-14 DIAGNOSIS — Z79899 Other long term (current) drug therapy: Secondary | ICD-10-CM

## 2023-05-14 LAB — CUP PACEART INCLINIC DEVICE CHECK
Battery Remaining Longevity: 10 mo
Battery Voltage: 2.88 V
Brady Statistic AP VP Percent: 22.4 %
Brady Statistic AP VS Percent: 0 %
Brady Statistic AS VP Percent: 77.59 %
Brady Statistic AS VS Percent: 0 %
Brady Statistic RA Percent Paced: 22.39 %
Brady Statistic RV Percent Paced: 99.99 %
Date Time Interrogation Session: 20240702204903
Implantable Lead Connection Status: 753985
Implantable Lead Connection Status: 753985
Implantable Lead Implant Date: 20170906
Implantable Lead Implant Date: 20170906
Implantable Lead Location: 753859
Implantable Lead Location: 753860
Implantable Lead Model: 3830
Implantable Lead Model: 5076
Implantable Pulse Generator Implant Date: 20170906
Lead Channel Impedance Value: 342 Ohm
Lead Channel Impedance Value: 418 Ohm
Lead Channel Impedance Value: 437 Ohm
Lead Channel Impedance Value: 513 Ohm
Lead Channel Pacing Threshold Amplitude: 0.5 V
Lead Channel Pacing Threshold Amplitude: 1 V
Lead Channel Pacing Threshold Amplitude: 1.625 V
Lead Channel Pacing Threshold Pulse Width: 0.4 ms
Lead Channel Pacing Threshold Pulse Width: 0.4 ms
Lead Channel Pacing Threshold Pulse Width: 1 ms
Lead Channel Sensing Intrinsic Amplitude: 1.75 mV
Lead Channel Sensing Intrinsic Amplitude: 2 mV
Lead Channel Sensing Intrinsic Amplitude: 5.625 mV
Lead Channel Sensing Intrinsic Amplitude: 5.625 mV
Lead Channel Setting Pacing Amplitude: 2 V
Lead Channel Setting Pacing Amplitude: 2.5 V
Lead Channel Setting Pacing Pulse Width: 1 ms
Lead Channel Setting Sensing Sensitivity: 0.9 mV
Zone Setting Status: 755011
Zone Setting Status: 755011

## 2023-05-14 NOTE — Patient Instructions (Addendum)
Medication Instructions:  Your physician recommends that you continue on your current medications as directed. Please refer to the Current Medication list given to you today.  *If you need a refill on your cardiac medications before your next appointment, please call your pharmacy*   Lab Work:  BMET today  If you have labs (blood work) drawn today and your tests are completely normal, you will receive your results only by: MyChart Message (if you have MyChart) OR A paper copy in the mail If you have any lab test that is abnormal or we need to change your treatment, we will call you to review the results.   Testing/Procedures: Your physician has requested that you have an echocardiogram. Echocardiography is a painless test that uses sound waves to create images of your heart. It provides your doctor with information about the size and shape of your heart and how well your heart's chambers and valves are working. This procedure takes approximately one hour. There are no restrictions for this procedure. Please do NOT wear cologne, perfume, aftershave, or lotions (deodorant is allowed). Please arrive 15 minutes prior to your appointment time.    Follow-Up: At [facility], you and your health needs are our priority.  As part of our continuing mission to provide you with exceptional heart care, we have created designated Provider Care Teams.  These Care Teams include your primary Cardiologist (physician) and Advanced Practice Providers (APPs -  Physician Assistants and Nurse Practitioners) who all work together to provide you with the care you need, when you need it.  We recommend signing up for the patient portal called "MyChart".  Sign up information is provided on this After Visit Summary.  MyChart is used to connect with patients for Virtual Visits (Telemedicine).  Patients are able to view lab/test results, encounter notes, upcoming appointments, etc.  Non-urgent messages can be  sent to your provider as well.   To learn more about what you can do with MyChart, go to [email or web address].    Your next appointment:   10 months with Dr Odessa Fleming Elodia Florence or Luster Landsberg

## 2023-05-14 NOTE — Progress Notes (Signed)
Patient Care Team: Shelva Majestic, MD as PCP - General (Family Medicine)   HPI  Kylie Patrick is a 87 y.o. female Seen in followup for His Bundle pacemaker implanted 9/17 for symptomatic 2:1 block now with CHB She had had syncope and then weakness prior to pacing    The patient denies chest pain, nocturnal dyspnea, orthopnea or peripheral edema.  There have been no palpitations, lightheadedness or syncope.  Complains of shortness of breath with exertion.  Worse over the last year.  Less than 150 feet.Marland Kitchen   DATE TEST EF   12/16 Echo   55-60 %                 ;    Date Cr K Hgb  6/20 1.29 3.5  12.8  9/21  1.93 3.7 11.7  8/22 1.34 3.1 12.1 (5/22)  5/23 1.50 3.6 13.7  5/24 1 .77 3.4  12.7       She tells me that her husband died in early August, Jimmy.  Had had a stroke a year before and then developed aspiration pneumonia.  Her daughter in law with her Reyes Ivan continues to improve Past Medical History:  Diagnosis Date   Anxiety    Colon polyp 2003   Complication of anesthesia    slow to wake up one time   Gastroenteritis    w/ renal insufficiency in context of protracted nausea & vomitting 2006   GERD (gastroesophageal reflux disease)    H/O hiatal hernia    Headache(784.0)    occasional lifelong- gabapentin helps a   Hiatal hernia    Hyperlipidemia    Hypertension    w/ LVH on ECHO   Pacemaker 07/2016   Tonsillitis 2006    Past Surgical History:  Procedure Laterality Date   ABDOMINAL HYSTERECTOMY  1976   BSO for endometriosis and bengin tumor   APPENDECTOMY  1970   BREAST LUMPECTOMY Left 70's   X 2   BUNIONECTOMY Bilateral    CATARACT EXTRACTION W/PHACO Left 05/14/2013   Procedure: CATARACT EXTRACTION PHACO AND INTRAOCULAR LENS PLACEMENT (IOC);  Surgeon: Gemma Payor, MD;  Location: [facility];  Service: Ophthalmology;  Laterality: Left;  CDE:9.76   CATARACT EXTRACTION W/PHACO Right 06/08/2013   Procedure: CATARACT EXTRACTION PHACO AND  INTRAOCULAR LENS PLACEMENT (IOC);  Surgeon: Gemma Payor, MD;  Location: [facility];  Service: Ophthalmology;  Laterality: Right;  CDE 13.65   CHOLECYSTECTOMY  1970   COLONOSCOPY W/ POLYPECTOMY  2003   Dr Leone Payor   EP IMPLANTABLE DEVICE N/A 07/18/2016   Procedure: Pacemaker Implant;  Surgeon: [facility] Salvia, MD;  Location: [facility] INVASIVE CV LAB;  Service: Cardiovascular;  Laterality: N/A;   ESOPHAGEAL MANOMETRY N/A 10/05/2013   Procedure: ESOPHAGEAL MANOMETRY (EM);  Surgeon: Iva Boop, MD;  Location: [facility];  Service: Endoscopy;  Laterality: N/A;   ESOPHAGOGASTRODUODENOSCOPY  02/05/13   Large Hiatal Hernia   HERNIA REPAIR     LAPAROSCOPIC NISSEN FUNDOPLICATION Bilateral 10/22/2013   Procedure: LAPAROSCOPIC NISSEN FUNDOPLICATION with hiatal hernia repair ;  Surgeon: Mariella Saa, MD;  Location: [facility];  Service: General;  Laterality: Bilateral;    Current Outpatient Medications  Medication Sig Dispense Refill   ALPRAZolam (XANAX) 0.25 MG tablet Take 1 tablet (0.25 mg total) by mouth 2 (two) times daily as needed for anxiety. 30 tablet 5   Calcium in Bone Mineral Cmplx 350 MG MISC Take 3 each by mouth at bedtime. Takes 1 in the  morning and 2 at night     carvedilol (COREG) 6.25 MG tablet TAKE 1 TABLET BY MOUTH 2 TIMES DAILY WITH A MEAL. 180 tablet 3   Cholecalciferol (VITAMIN D3) 2000 UNITS TABS Take 1 tablet by mouth daily with lunch.     Cyanocobalamin (VITAMIN B-12 PO) Take 1 capsule by mouth daily at 2 am.     esomeprazole (NEXIUM) 40 MG capsule TAKE 1 CAPSULE BY MOUTH TWICE A DAY BEFORE A MEAL 180 capsule 3   ezetimibe-simvastatin (VYTORIN) 10-20 MG tablet TAKE 1 TABLET BY MOUTH EVERYDAY AT BEDTIME 90 tablet 2   famotidine (PEPCID) 40 MG tablet TAKE 1 TABLET BY MOUTH EVERYDAY AT BEDTIME 90 tablet 3   folic acid (FOLVITE) 400 MCG tablet Take 400 mcg by mouth every morning.     furosemide (LASIX) 40 MG tablet TAKE 1 TABLET BY MOUTH EVERY DAY 90 tablet 3   gabapentin (NEURONTIN) 100  MG capsule Take 1 capsule (100 mg total) by mouth at bedtime. 90 capsule 3   KLOR-CON M20 20 MEQ tablet TAKE 1 TABLET (20 MEQ TOTAL) BY MOUTH DAILY AS NEEDED. 90 tablet 1   losartan (COZAAR) 25 MG tablet TAKE 1 TABLET (25 MG TOTAL) BY MOUTH DAILY. 90 tablet 3   Magnesium 400 MG CAPS Take 400 mg by mouth at bedtime.     Omega-3 Fatty Acids (FISH OIL) 500 MG CAPS Take 500 mg by mouth daily with lunch.     No current facility-administered medications for this visit.    Allergies  Allergen Reactions   Chlorthalidone     01/09/13 creatinine 2.2   Hydrochlorothiazide     Hypokalemia   Losartan Potassium-Hctz     Hypokalemia   Norvasc [Amlodipine Besylate]     Edema   Raloxifene     HTN   Rofecoxib     HTN      Review of Systems negative except from HPI and PMH  Physical Exam BP 102/64   Pulse 71   Ht 4\' 11"  (1.499 m)   Wt 156 lb (70.8 kg)   SpO2 99%   BMI 31.51 kg/m  Well developed and well nourished in no acute distress HENT normal Neck supple with JVP-flat Clear Device pocket well healed; without hematoma or erythema.  There is no tethering  Regular rate and rhythm, no  gallop No  murmur Abd-soft with active BS No Clubbing cyanosis  edema Skin-warm and dry A & Oriented  Grossly normal sensory and motor function  ECG sinus rhythm with P synchronous pacing; QRSd 150 ms  Device function is normal. Programming changes none   See Paceart for details    Assessment and  Plan  2:1 AVB>> Complete heart block   Pacer- His Medtronic   HFpEF  Syncope  Hypertension  SCAF  hypokalemia-r recurrent    Patient has progressive dyspnea on exertion without orthopnea nocturnal dyspnea or edema.  Has some vague chest discomfort around her clavicle but not really associated.  Could be pacemaker cardiomyopathy.  Will check an echocardiogram and if this is unrevealing consider cardiac CTA.  With the aforementioned information we will make decisions regarding medications.   For right now we will continue carvedilol and losartan for her blood pressure and furosemide for her heart failure   Current medicines are reviewed at length with the patient today .  The patient does not  have concerns regarding medicines.

## 2023-05-15 LAB — BASIC METABOLIC PANEL
BUN/Creatinine Ratio: 10 — ABNORMAL LOW (ref 12–28)
BUN: 15 mg/dL (ref 8–27)
CO2: 24 mmol/L (ref 20–29)
Calcium: 9.8 mg/dL (ref 8.7–10.3)
Chloride: 103 mmol/L (ref 96–106)
Creatinine, Ser: 1.54 mg/dL — ABNORMAL HIGH (ref 0.57–1.00)
Glucose: 102 mg/dL — ABNORMAL HIGH (ref 70–99)
Potassium: 3.8 mmol/L (ref 3.5–5.2)
Sodium: 143 mmol/L (ref 134–144)
eGFR: 33 mL/min/{1.73_m2} — ABNORMAL LOW (ref >59)

## 2023-05-17 ENCOUNTER — Encounter: Payer: Medicare PPO | Admitting: Internal Medicine

## 2023-06-03 ENCOUNTER — Ambulatory Visit (HOSPITAL_COMMUNITY): Payer: Medicare PPO | Attending: Internal Medicine

## 2023-06-03 DIAGNOSIS — I442 Atrioventricular block, complete: Secondary | ICD-10-CM | POA: Insufficient documentation

## 2023-06-03 DIAGNOSIS — R6889 Other general symptoms and signs: Secondary | ICD-10-CM | POA: Diagnosis not present

## 2023-06-03 DIAGNOSIS — Z950 Presence of cardiac pacemaker: Secondary | ICD-10-CM | POA: Diagnosis not present

## 2023-06-03 DIAGNOSIS — I4891 Unspecified atrial fibrillation: Secondary | ICD-10-CM | POA: Diagnosis not present

## 2023-06-03 LAB — ECHOCARDIOGRAM COMPLETE
Area-P 1/2: 3.37 cm2
S' Lateral: 2.7 cm

## 2023-06-04 ENCOUNTER — Encounter (HOSPITAL_COMMUNITY): Payer: Self-pay

## 2023-06-04 ENCOUNTER — Ambulatory Visit (HOSPITAL_COMMUNITY)
Admission: RE | Admit: 2023-06-04 | Discharge: 2023-06-04 | Disposition: A | Discharge status: Home / Self Care | Payer: Medicare PPO | Source: Ambulatory Visit | Attending: Family Medicine | Admitting: Family Medicine

## 2023-06-04 ENCOUNTER — Ambulatory Visit: Payer: Medicare PPO | Admitting: Family Medicine

## 2023-06-04 ENCOUNTER — Encounter: Payer: Self-pay | Admitting: Family Medicine

## 2023-06-04 VITALS — BP 110/70 | HR 82 | Temp 98.1°F | Ht 59.0 in | Wt 163.8 lb

## 2023-06-04 DIAGNOSIS — N183 Chronic kidney disease, stage 3 unspecified: Secondary | ICD-10-CM

## 2023-06-04 DIAGNOSIS — K1379 Other lesions of oral mucosa: Secondary | ICD-10-CM | POA: Insufficient documentation

## 2023-06-04 DIAGNOSIS — I10 Essential (primary) hypertension: Secondary | ICD-10-CM

## 2023-06-04 DIAGNOSIS — R220 Localized swelling, mass and lump, head: Secondary | ICD-10-CM | POA: Diagnosis not present

## 2023-06-04 DIAGNOSIS — C760 Malignant neoplasm of head, face and neck: Secondary | ICD-10-CM | POA: Diagnosis not present

## 2023-06-04 LAB — POCT I-STAT CREATININE: Creatinine, Ser: 1.5 mg/dL — ABNORMAL HIGH (ref 0.44–1.00)

## 2023-06-04 MED ORDER — SODIUM CHLORIDE (PF) 0.9 % IJ SOLN
INTRAMUSCULAR | Status: AC
  Filled 2023-06-04: qty 50

## 2023-06-04 MED ORDER — IOHEXOL 300 MG/ML  SOLN
60.0000 mL | Freq: Once | INTRAMUSCULAR | Status: AC | PRN
  Administered 2023-06-04: 60 mL via INTRAVENOUS

## 2023-06-04 NOTE — Patient Instructions (Addendum)
We will call you within a week about your referral to Ct maxillofacial  through [facility].  Their phone number is [phone].  Please call them if you have not heard in a week -team I did order as stat  If you have new or worsening symptoms let us know immediately  Recommended follow up: Return for next already scheduled visit or sooner if needed.

## 2023-06-04 NOTE — Addendum Note (Signed)
Addended by: Gwenette Greet on: 06/04/2023 09:18 AM   Modules accepted: Orders

## 2023-06-04 NOTE — Progress Notes (Signed)
Phone [phone] In person visit   Subjective:   Kylie Patrick is a 87 y.o. year old very pleasant female patient who presents for/with See problem oriented charting Chief Complaint  Patient presents with   bump on face    Being there x 2wks, hard to touch, no pain.    Past Medical History-  Patient Active Problem List   Diagnosis Date Noted   Atrial fibrillation (HCC) -SCAF 08/14/2022    Priority: High   Pacemaker - MDT 08/11/2020    Priority: High   Complete heart block (HCC) 04/05/2020    Priority: High   Aortic atherosclerosis (HCC) 04/26/2020    Priority: Medium    Osteoporosis 04/25/2020    Priority: Medium    Carpal tunnel syndrome, right 05/02/2019    Priority: Medium    Diastolic dysfunction 07/12/2016    Priority: Medium    Hyperglycemia 10/09/2014    Priority: Medium    GERD (gastroesophageal reflux disease) 10/22/2013    Priority: Medium    CKD (chronic kidney disease) stage 3, GFR 30-59 ml/min (HCC) 05/05/2008    Priority: Medium    Anxiety state 04/20/2008    Priority: Medium    HYPERLIPIDEMIA 08/12/2007    Priority: Medium    Essential hypertension 08/12/2007    Priority: Medium    Rash 07/04/2017    Priority: Low   Hiatal hernia 08/03/2013    Priority: Low   Personal history of colonic polyps 01/09/2013    Priority: Low   NIGHT SWEATS 02/19/2008    Priority: Low   Exercise intolerance 08/14/2022    Medications- reviewed and updated Current Outpatient Medications  Medication Sig Dispense Refill   ALPRAZolam (XANAX) 0.25 MG tablet Take 1 tablet (0.25 mg total) by mouth 2 (two) times daily as needed for anxiety. 30 tablet 5   Calcium in Bone Mineral Cmplx 350 MG MISC Take 3 each by mouth at bedtime. Takes 1 in the morning and 2 at night     carvedilol (COREG) 6.25 MG tablet TAKE 1 TABLET BY MOUTH 2 TIMES DAILY WITH A MEAL. 180 tablet 3   Cholecalciferol (VITAMIN D3) 2000 UNITS TABS Take 1 tablet by mouth daily with lunch.     Cyanocobalamin  (VITAMIN B-12 PO) Take 1 capsule by mouth daily at 2 am.     esomeprazole (NEXIUM) 40 MG capsule TAKE 1 CAPSULE BY MOUTH TWICE A DAY BEFORE A MEAL 180 capsule 3   ezetimibe-simvastatin (VYTORIN) 10-20 MG tablet TAKE 1 TABLET BY MOUTH EVERYDAY AT BEDTIME 90 tablet 2   famotidine (PEPCID) 40 MG tablet TAKE 1 TABLET BY MOUTH EVERYDAY AT BEDTIME 90 tablet 3   folic acid (FOLVITE) 400 MCG tablet Take 400 mcg by mouth every morning.     furosemide (LASIX) 40 MG tablet TAKE 1 TABLET BY MOUTH EVERY DAY 90 tablet 3   gabapentin (NEURONTIN) 100 MG capsule Take 1 capsule (100 mg total) by mouth at bedtime. 90 capsule 3   KLOR-CON M20 20 MEQ tablet TAKE 1 TABLET (20 MEQ TOTAL) BY MOUTH DAILY AS NEEDED. 90 tablet 1   losartan (COZAAR) 25 MG tablet TAKE 1 TABLET (25 MG TOTAL) BY MOUTH DAILY. 90 tablet 3   Magnesium 400 MG CAPS Take 400 mg by mouth at bedtime.     Omega-3 Fatty Acids (FISH OIL) 500 MG CAPS Take 500 mg by mouth daily with lunch.     No current facility-administered medications for this visit.     Objective:  BP 110/70  Pulse 82   Temp 98.1 F (36.7 C)   Ht 4\' 11"  (1.499 m)   Wt 163 lb 12.8 oz (74.3 kg)   SpO2 97%   BMI 33.08 kg/m  Gen: NAD, resting comfortably No lymphadenopathy cervical  Below left lower teeth below mucosa and can be felt from outside of mouth 2-3 x 2-3 cm area probably < 1 cm in depth noted. nontender CV: RRR no murmurs rubs or gallops Lungs: CTAB no crackles, wheeze, rhonchi Ext: no edema, DP and PT pulses present bilaterally     Assessment and Plan   # Mouth mass S:noted 2 weeks ago. No pain but hard to touch.  Feels most from inside of mouth can feel with tongue but actually first noted when resting her face on her hand form the outside - at least 2 x 2 cm. No unintentional weight loss or fevers. Feels well overall.  A/P: Mouth mass about 2-3 x 2-3 cm in front of  left lower jaw. Somewhat firm- want to rule out malignancy. Could be cyst. Does not seem to  be in position to be associated with salivary glands. Consider ENT consult. Start with Ct maxillofacial with contrast ordered today  #hypertension S: medication:  Carvedilol 6.25 mg BID , Lasix 40mg  daily, losartan 25mg  (reduced by Dr. Kathrene Bongo from 100mg  originally to 25 mg) BP Readings from Last 3 Encounters:  06/04/23 110/70  05/14/23 102/64  04/12/23 114/76  A/P:stable- continue current medicines    #she wanted me to check pulses in her feet- gets some tingling- DP and PT pulses both noted today. Can further revaluate at another time  % Chronic kidney disease Stage III-follows with Dr. Kathrene Bongo S: knows to avoid nsaids.  A/P: will try for CT with contrast- had labs 3 weeks ago so do not think we need udpate. Will need to adjust contrast load likely- included that in detail to radiology  Recommended follow up: Return for next already scheduled visit or sooner if needed. Future Appointments  Date Time Provider Department Center  07/12/2023  7:05 AM [facility] DEVICE REMOTES [facility]  10/09/2023  9:20 AM Shelva Majestic, MD [facility]  10/11/2023  7:10 AM [facility] DEVICE REMOTES [facility]  01/10/2024  7:05 AM [facility] DEVICE REMOTES [facility]  04/10/2024  7:05 AM [facility] DEVICE REMOTES [facility]  07/10/2024  7:05 AM [facility] DEVICE REMOTES [facility]   Lab/Order associations:   ICD-10-CM   1. Mass of mouth  K13.79 CT MAXILLOFACIAL W CONTRAST    2. Essential hypertension  I10     3. Stage 3 chronic kidney disease, unspecified whether stage 3a or 3b CKD (HCC)  N18.30      No orders of the defined types were placed in this encounter.  Return precautions advised.  Tana Conch, MD

## 2023-06-05 ENCOUNTER — Other Ambulatory Visit: Payer: Self-pay

## 2023-06-05 DIAGNOSIS — K1379 Other lesions of oral mucosa: Secondary | ICD-10-CM

## 2023-06-10 ENCOUNTER — Telehealth: Payer: Self-pay

## 2023-06-10 MED ORDER — CARVEDILOL 6.25 MG PO TABS: 9.3750 mg | ORAL_TABLET | Freq: Two times a day (BID) | ORAL | 3 refills | Status: DC

## 2023-06-10 NOTE — Telephone Encounter (Signed)
Spoke with pt and advised of normal, stable heart muscle function per Dr Graciela Husbands.  Pt advised of recommendations per Dr Graciela Husbands for dyspnea.  Pt would like to try increasing her Carvedilol first.  Medication list updated.  Pt verbalizes understanding and thanked Charity fundraiser for the call.

## 2023-06-10 NOTE — Telephone Encounter (Signed)
-----   Message from Sherryl Manges sent at 06/07/2023  2:23 PM EDT -----  Please Inform Patient Echo showed  normal and stable heart muscle function  Three thoughts related to dyspnea We should try one and see how she does  From most expensive 1) farxiga/jardiance 10 mg daily 2) increase carvedilol to 9.375 3) increase lasix to 40 bid x 3 days and see how she does and then increase to 60  No strong preference but probably lean to the 3rd-- tehe two qustoins if we increase her diuretics would b 1) does she get less SOB 2) does she get LH ( as BP runs low)      Thanks

## 2023-06-17 ENCOUNTER — Other Ambulatory Visit: Payer: Self-pay | Admitting: Family Medicine

## 2023-07-02 DIAGNOSIS — K099 Cyst of oral region, unspecified: Secondary | ICD-10-CM | POA: Diagnosis not present

## 2023-07-12 ENCOUNTER — Ambulatory Visit (INDEPENDENT_AMBULATORY_CARE_PROVIDER_SITE_OTHER): Payer: Medicare PPO

## 2023-07-12 DIAGNOSIS — I442 Atrioventricular block, complete: Secondary | ICD-10-CM | POA: Diagnosis not present

## 2023-07-13 LAB — CUP PACEART REMOTE DEVICE CHECK
Battery Remaining Longevity: 7 mo
Battery Voltage: 2.87 V
Brady Statistic AP VP Percent: 20.78 %
Brady Statistic AP VS Percent: 0 %
Brady Statistic AS VP Percent: 79.21 %
Brady Statistic AS VS Percent: 0 %
Brady Statistic RA Percent Paced: 20.75 %
Brady Statistic RV Percent Paced: 99.97 %
Date Time Interrogation Session: 20240830081659
Implantable Lead Connection Status: 753985
Implantable Lead Connection Status: 753985
Implantable Lead Implant Date: 20170906
Implantable Lead Implant Date: 20170906
Implantable Lead Location: 753859
Implantable Lead Location: 753860
Implantable Lead Model: 3830
Implantable Lead Model: 5076
Implantable Pulse Generator Implant Date: 20170906
Lead Channel Impedance Value: 304 Ohm
Lead Channel Impedance Value: 418 Ohm
Lead Channel Impedance Value: 437 Ohm
Lead Channel Impedance Value: 513 Ohm
Lead Channel Pacing Threshold Amplitude: 0.5 V
Lead Channel Pacing Threshold Amplitude: 1.75 V
Lead Channel Pacing Threshold Pulse Width: 0.4 ms
Lead Channel Pacing Threshold Pulse Width: 0.4 ms
Lead Channel Sensing Intrinsic Amplitude: 1.875 mV
Lead Channel Sensing Intrinsic Amplitude: 1.875 mV
Lead Channel Sensing Intrinsic Amplitude: 5.625 mV
Lead Channel Sensing Intrinsic Amplitude: 5.625 mV
Lead Channel Setting Pacing Amplitude: 2 V
Lead Channel Setting Pacing Amplitude: 2.5 V
Lead Channel Setting Pacing Pulse Width: 1 ms
Lead Channel Setting Sensing Sensitivity: 0.9 mV
Zone Setting Status: 755011
Zone Setting Status: 755011

## 2023-07-16 NOTE — Progress Notes (Signed)
Remote pacemaker transmission.   

## 2023-07-19 DIAGNOSIS — C8299 Follicular lymphoma, unspecified, extranodal and solid organ sites: Secondary | ICD-10-CM | POA: Diagnosis not present

## 2023-07-19 DIAGNOSIS — C8519 Unspecified B-cell lymphoma, extranodal and solid organ sites: Secondary | ICD-10-CM | POA: Diagnosis not present

## 2023-07-19 DIAGNOSIS — C8291 Follicular lymphoma, unspecified, lymph nodes of head, face, and neck: Secondary | ICD-10-CM | POA: Diagnosis not present

## 2023-07-19 DIAGNOSIS — K1379 Other lesions of oral mucosa: Secondary | ICD-10-CM | POA: Diagnosis not present

## 2023-08-02 ENCOUNTER — Encounter: Payer: Self-pay | Admitting: Family Medicine

## 2023-08-02 DIAGNOSIS — C8301 Small cell B-cell lymphoma, lymph nodes of head, face, and neck: Secondary | ICD-10-CM

## 2023-08-02 NOTE — Telephone Encounter (Signed)
[facility]. Would like a call back from Dr Durene Cal about the pt. Please call them back at [phone].

## 2023-08-02 NOTE — Telephone Encounter (Signed)
I received call from oral surgeon-unfortunately biopsy showed B-cell lymphoma for oral mass-urgent referral to oncology was placed just now.  Team they report pathology was sent to [place] about an hour ago-we will need a copy of this to go with the urgent referral

## 2023-08-02 NOTE — Telephone Encounter (Signed)
See below

## 2023-08-06 ENCOUNTER — Other Ambulatory Visit: Payer: Self-pay | Admitting: Family Medicine

## 2023-08-06 NOTE — Telephone Encounter (Signed)
Still awaiting records

## 2023-08-08 ENCOUNTER — Inpatient Hospital Stay: Payer: Medicare PPO | Attending: Hematology | Admitting: Hematology

## 2023-08-08 ENCOUNTER — Inpatient Hospital Stay: Payer: Medicare PPO

## 2023-08-08 VITALS — BP 139/60 | HR 69 | Temp 97.9°F | Resp 18 | Wt 159.5 lb

## 2023-08-08 DIAGNOSIS — K219 Gastro-esophageal reflux disease without esophagitis: Secondary | ICD-10-CM | POA: Diagnosis not present

## 2023-08-08 DIAGNOSIS — Z8249 Family history of ischemic heart disease and other diseases of the circulatory system: Secondary | ICD-10-CM | POA: Diagnosis not present

## 2023-08-08 DIAGNOSIS — Z90710 Acquired absence of both cervix and uterus: Secondary | ICD-10-CM | POA: Insufficient documentation

## 2023-08-08 DIAGNOSIS — E785 Hyperlipidemia, unspecified: Secondary | ICD-10-CM | POA: Insufficient documentation

## 2023-08-08 DIAGNOSIS — C8291 Follicular lymphoma, unspecified, lymph nodes of head, face, and neck: Secondary | ICD-10-CM

## 2023-08-08 DIAGNOSIS — Z6832 Body mass index (BMI) 32.0-32.9, adult: Secondary | ICD-10-CM | POA: Diagnosis not present

## 2023-08-08 DIAGNOSIS — Z803 Family history of malignant neoplasm of breast: Secondary | ICD-10-CM | POA: Diagnosis not present

## 2023-08-08 DIAGNOSIS — R634 Abnormal weight loss: Secondary | ICD-10-CM | POA: Diagnosis not present

## 2023-08-08 DIAGNOSIS — D171 Benign lipomatous neoplasm of skin and subcutaneous tissue of trunk: Secondary | ICD-10-CM | POA: Diagnosis not present

## 2023-08-08 DIAGNOSIS — R220 Localized swelling, mass and lump, head: Secondary | ICD-10-CM | POA: Insufficient documentation

## 2023-08-08 DIAGNOSIS — Z90722 Acquired absence of ovaries, bilateral: Secondary | ICD-10-CM | POA: Diagnosis not present

## 2023-08-08 DIAGNOSIS — I10 Essential (primary) hypertension: Secondary | ICD-10-CM | POA: Insufficient documentation

## 2023-08-08 DIAGNOSIS — F419 Anxiety disorder, unspecified: Secondary | ICD-10-CM | POA: Insufficient documentation

## 2023-08-08 DIAGNOSIS — Z823 Family history of stroke: Secondary | ICD-10-CM | POA: Insufficient documentation

## 2023-08-08 DIAGNOSIS — W19XXXA Unspecified fall, initial encounter: Secondary | ICD-10-CM | POA: Insufficient documentation

## 2023-08-08 DIAGNOSIS — Z833 Family history of diabetes mellitus: Secondary | ICD-10-CM | POA: Diagnosis not present

## 2023-08-08 DIAGNOSIS — Z8719 Personal history of other diseases of the digestive system: Secondary | ICD-10-CM | POA: Diagnosis not present

## 2023-08-08 DIAGNOSIS — R5383 Other fatigue: Secondary | ICD-10-CM | POA: Diagnosis not present

## 2023-08-08 DIAGNOSIS — G629 Polyneuropathy, unspecified: Secondary | ICD-10-CM | POA: Insufficient documentation

## 2023-08-08 DIAGNOSIS — Z9049 Acquired absence of other specified parts of digestive tract: Secondary | ICD-10-CM | POA: Diagnosis not present

## 2023-08-08 DIAGNOSIS — Z79899 Other long term (current) drug therapy: Secondary | ICD-10-CM | POA: Diagnosis not present

## 2023-08-08 DIAGNOSIS — Z800 Family history of malignant neoplasm of digestive organs: Secondary | ICD-10-CM | POA: Insufficient documentation

## 2023-08-08 LAB — CBC WITH DIFFERENTIAL (CANCER CENTER ONLY)
Abs Immature Granulocytes: 0.01 10*3/uL (ref 0.00–0.07)
Basophils Absolute: 0.1 10*3/uL (ref 0.0–0.1)
Basophils Relative: 1 %
Eosinophils Absolute: 0.3 10*3/uL (ref 0.0–0.5)
Eosinophils Relative: 4 %
HCT: 38.3 % (ref 36.0–46.0)
Hemoglobin: 12.5 g/dL (ref 12.0–15.0)
Immature Granulocytes: 0 %
Lymphocytes Relative: 31 %
Lymphs Abs: 1.8 10*3/uL (ref 0.7–4.0)
MCH: 29.7 pg (ref 26.0–34.0)
MCHC: 32.6 g/dL (ref 30.0–36.0)
MCV: 91 fL (ref 80.0–100.0)
Monocytes Absolute: 0.6 10*3/uL (ref 0.1–1.0)
Monocytes Relative: 10 %
Neutro Abs: 3.1 10*3/uL (ref 1.7–7.7)
Neutrophils Relative %: 54 %
Platelet Count: 272 10*3/uL (ref 150–400)
RBC: 4.21 MIL/uL (ref 3.87–5.11)
RDW: 13.6 % (ref 11.5–15.5)
WBC Count: 5.9 10*3/uL (ref 4.0–10.5)
nRBC: 0 % (ref 0.0–0.2)

## 2023-08-08 LAB — CMP (CANCER CENTER ONLY)
ALT: 13 U/L (ref 0–44)
AST: 19 U/L (ref 15–41)
Albumin: 4.1 g/dL (ref 3.5–5.0)
Alkaline Phosphatase: 54 U/L (ref 38–126)
Anion gap: 8 (ref 5–15)
BUN: 25 mg/dL — ABNORMAL HIGH (ref 8–23)
CO2: 27 mmol/L (ref 22–32)
Calcium: 9.7 mg/dL (ref 8.9–10.3)
Chloride: 107 mmol/L (ref 98–111)
Creatinine: 1.58 mg/dL — ABNORMAL HIGH (ref 0.44–1.00)
GFR, Estimated: 32 mL/min — ABNORMAL LOW (ref >60)
Glucose, Bld: 94 mg/dL (ref 70–99)
Potassium: 3.7 mmol/L (ref 3.5–5.1)
Sodium: 142 mmol/L (ref 135–145)
Total Bilirubin: 0.6 mg/dL (ref 0.3–1.2)
Total Protein: 7 g/dL (ref 6.5–8.1)

## 2023-08-08 LAB — LACTATE DEHYDROGENASE: LDH: 177 U/L (ref 98–192)

## 2023-08-08 LAB — HEPATITIS B SURFACE ANTIGEN: Hepatitis B Surface Ag: NONREACTIVE

## 2023-08-08 LAB — HEPATITIS C ANTIBODY: HCV Ab: NONREACTIVE

## 2023-08-08 LAB — HIV ANTIBODY (ROUTINE TESTING W REFLEX): HIV Screen 4th Generation wRfx: NONREACTIVE

## 2023-08-08 LAB — HEPATITIS B CORE ANTIBODY, TOTAL: Hep B Core Total Ab: NONREACTIVE

## 2023-08-08 NOTE — Progress Notes (Signed)
HEMATOLOGY/ONCOLOGY CONSULTATION NOTE  Date of Service: 08/08/2023  Patient Care Team: Shelva Majestic, MD as PCP - General (Family Medicine)  CHIEF COMPLAINTS/PURPOSE OF CONSULTATION:  Evaluation and management of Newly diagnosed low-grade follicular lymphoma   HISTORY OF PRESENTING ILLNESS:   Kylie Patrick is a wonderful 87 y.o. female who has been referred to [place] by Paulita Fujita, MD for evaluation and management of low-grade follicular lymphoma.   She was seen by Paulita Fujita, MD on 06/04/2023 and reported a mouth mass at least 2 x 2 cm in front of left lower jaw.   Today, she is accompanied by her daughter-in-law and niece. She reports that she noticed the small nodule in her left cheek after resting her cheek in her palm. The nodule appeared relatively quickly and did not change over time. She has not had any significantly issues with the nodule. Patient also reports noticing a small nodule in her inner right cheek. She denies any other lumps/bumps in any other areas.   Patient denies any  major vision issues, chest pain, abdominal pain, change bowel habits, change in urination, new leg swelling, unexplained fever, chills, night sweats, weight loss, or significant new fatigue. Patient does sometimes endorse swallowing issues only at night.   She reports having neuropathy in her bilateral LEs. Patient denies any history of DM. Her symptoms began with nerve damage between her toes. Her nerve damage is stable at this time. However, her neuropathy has radiated to ankles. She manages her neuropathy with gabapentin.   Patient regularly takes her blood pressure medication and her BP is well controlled.   She reports a 20-pound weight loss attributed to stress over the last two years related to the loss of close family members including her son from lung cancer and her husband over the last couple of years. Patient reports having a lack of appetite likely related to anxiety and  stressful factors.   She denies any family history of lupus, RA, Sjogren's or other autoimmune disorders. Patient denies any issues with dry mouth or dry eyes. Patient denies any thyroid issues or history of stroke.   Patient reports that she had mouth surgery several years ago after a fall causing teeth injury. Patient does have acid reflux which is managed by medication. Patient denies any new medications.   Patient does have a lipoma in her back. She notes mild scoliosis on previous imaging. Her sister and aunt have similar scoliosis issues.   She regularly takes vitamin B complex, fish oil, calcium, magnesium. She takes vitamin b12 regularly due to having low levels on recent labs. She previously took folic acid supplements, but has not recently due to running out.   She reports being very easily fatigued, which is a recent change over the last 3 months. Patient reports that she is more easily SOB recently, especially on exertion. She was seen by an EP on 07/12/2023 and reports that she requires continuous pacing and her pacemaker will have to be changed soon.   Patient is planning to travel to the beach from 08/12/2023 to 09/16/2023.   MEDICAL HISTORY:  Past Medical History:  Diagnosis Date   Anxiety    Colon polyp 2003   Complication of anesthesia    slow to wake up one time   Gastroenteritis    w/ renal insufficiency in context of protracted nausea & vomitting 2006   GERD (gastroesophageal reflux disease)    H/O hiatal hernia    Headache(784.0)  occasional lifelong- gabapentin helps a   Hiatal hernia    Hyperlipidemia    Hypertension    w/ LVH on ECHO   Pacemaker 07/2016   Tonsillitis 2006    SURGICAL HISTORY: Past Surgical History:  Procedure Laterality Date   ABDOMINAL HYSTERECTOMY  1976   BSO for endometriosis and bengin tumor   APPENDECTOMY  1970   BREAST LUMPECTOMY Left 70's   X 2   BUNIONECTOMY Bilateral    CATARACT EXTRACTION W/PHACO Left 05/14/2013    Procedure: CATARACT EXTRACTION PHACO AND INTRAOCULAR LENS PLACEMENT (IOC);  Surgeon: Gemma Payor, MD;  Location: [facility];  Service: Ophthalmology;  Laterality: Left;  CDE:9.76   CATARACT EXTRACTION W/PHACO Right 06/08/2013   Procedure: CATARACT EXTRACTION PHACO AND INTRAOCULAR LENS PLACEMENT (IOC);  Surgeon: Gemma Payor, MD;  Location: [facility];  Service: Ophthalmology;  Laterality: Right;  CDE 13.65   CHOLECYSTECTOMY  1970   COLONOSCOPY W/ POLYPECTOMY  2003   Dr Leone Payor   EP IMPLANTABLE DEVICE N/A 07/18/2016   Procedure: Pacemaker Implant;  Surgeon: Duke Salvia, MD;  Location: [facility] INVASIVE CV LAB;  Service: Cardiovascular;  Laterality: N/A;   ESOPHAGEAL MANOMETRY N/A 10/05/2013   Procedure: ESOPHAGEAL MANOMETRY (EM);  Surgeon: Iva Boop, MD;  Location: [facility];  Service: Endoscopy;  Laterality: N/A;   ESOPHAGOGASTRODUODENOSCOPY  02/05/13   Large Hiatal Hernia   HERNIA REPAIR     LAPAROSCOPIC NISSEN FUNDOPLICATION Bilateral 10/22/2013   Procedure: LAPAROSCOPIC NISSEN FUNDOPLICATION with hiatal hernia repair ;  Surgeon: Mariella Saa, MD;  Location: [facility];  Service: General;  Laterality: Bilateral;    SOCIAL HISTORY: Social History   Socioeconomic History   Marital status: Widowed    Spouse name: Not on file   Number of children: 2   Years of education: Not on file   Highest education level: Not on file  Occupational History   Occupation: Retired    Associate Professor: RETIRED  Tobacco Use   Smoking status: Never   Smokeless tobacco: Never  Vaping Use   Vaping status: Never Used  Substance and Sexual Activity   Alcohol use: Yes    Comment: Wine-VERY RARELY   Drug use: No   Sexual activity: Not Currently  Other Topics Concern   Not on file  Social History Narrative   Lives alone, husband died 2023/08/01   Right Handed   Drinks caffeine rarely   Social Determinants of Health   Financial Resource Strain: Low Risk  (09/20/2022)   Overall Financial Resource Strain (CARDIA)     Difficulty of Paying Living Expenses: Not hard at all  Food Insecurity: No Food Insecurity (09/20/2022)   Hunger Vital Sign    Worried About Running Out of Food in the Last Year: Never true    Ran Out of Food in the Last Year: Never true  Transportation Needs: No Transportation Needs (09/20/2022)   PRAPARE - Administrator, Civil Service (Medical): No    Lack of Transportation (Non-Medical): No  Physical Activity: Inactive (09/20/2022)   Exercise Vital Sign    Days of Exercise per Week: 0 days    Minutes of Exercise per Session: 0 min  Stress: No Stress Concern Present (09/20/2022)   [employer] of Occupational Health - Occupational Stress Questionnaire    Feeling of Stress : Not at all  Social Connections: Socially Isolated (09/20/2022)   Social Connection and Isolation Panel [NHANES]    Frequency of Communication with Friends and Family: More than three times a  week    Frequency of Social Gatherings with Friends and Family: More than three times a week    Attends Religious Services: Never    Database administrator or Organizations: No    Attends Banker Meetings: Never    Marital Status: Widowed  Intimate Partner Violence: Not At Risk (09/20/2022)   Humiliation, Afraid, Rape, and Kick questionnaire    Fear of Current or Ex-Partner: No    Emotionally Abused: No    Physically Abused: No    Sexually Abused: No    FAMILY HISTORY: Family History  Problem Relation Age of Onset   Heart attack Father 81       fatal MI @ 27   Coronary artery disease Mother        CABG; MI @ 16   Diabetes Mother    Stroke Paternal Grandfather        > 52   Stroke Maternal Grandmother        in 36s   Aneurysm Paternal Grandmother        cns ; in 55s   Diabetes Maternal Aunt    Stomach cancer Maternal Aunt    Breast cancer Maternal Aunt    Heart disease Sister    Coronary artery disease Maternal Grandfather    Colon cancer Neg Hx    Esophageal cancer Neg Hx      ALLERGIES:  is allergic to chlorthalidone, hydrochlorothiazide, losartan potassium-hctz, norvasc [amlodipine besylate], raloxifene, and rofecoxib.  MEDICATIONS:  Current Outpatient Medications  Medication Sig Dispense Refill   ALPRAZolam (XANAX) 0.25 MG tablet Take 1 tablet (0.25 mg total) by mouth 2 (two) times daily as needed for anxiety (Do not drive for 8 hours after taking.  If any falls on medicine stop immediately and let us know). 30 tablet 0   Calcium in Bone Mineral Cmplx 350 MG MISC Take 3 each by mouth at bedtime. Takes 1 in the morning and 2 at night     carvedilol (COREG) 6.25 MG tablet Take 1.5 tablets (9.375 mg total) by mouth 2 (two) times daily with a meal. 270 tablet 3   Cholecalciferol (VITAMIN D3) 2000 UNITS TABS Take 1 tablet by mouth daily with lunch.     Cyanocobalamin (VITAMIN B-12 PO) Take 1 capsule by mouth daily at 2 am.     esomeprazole (NEXIUM) 40 MG capsule TAKE 1 CAPSULE BY MOUTH TWICE A DAY BEFORE A MEAL 180 capsule 3   ezetimibe-simvastatin (VYTORIN) 10-20 MG tablet TAKE 1 TABLET BY MOUTH EVERYDAY AT BEDTIME 90 tablet 2   famotidine (PEPCID) 40 MG tablet TAKE 1 TABLET BY MOUTH EVERYDAY AT BEDTIME 90 tablet 3   folic acid (FOLVITE) 400 MCG tablet Take 400 mcg by mouth every morning.     furosemide (LASIX) 40 MG tablet TAKE 1 TABLET BY MOUTH EVERY DAY 90 tablet 3   gabapentin (NEURONTIN) 100 MG capsule Take 1 capsule (100 mg total) by mouth at bedtime. 90 capsule 3   KLOR-CON M20 20 MEQ tablet TAKE 1 TABLET (20 MEQ TOTAL) BY MOUTH DAILY AS NEEDED. 90 tablet 1   losartan (COZAAR) 25 MG tablet TAKE 1 TABLET (25 MG TOTAL) BY MOUTH DAILY. 90 tablet 3   Magnesium 400 MG CAPS Take 400 mg by mouth at bedtime.     Omega-3 Fatty Acids (FISH OIL) 500 MG CAPS Take 500 mg by mouth daily with lunch.     No current facility-administered medications for this visit.    REVIEW OF  SYSTEMS:    10 Point review of Systems was done is negative except as noted  above.  PHYSICAL EXAMINATION: ECOG PERFORMANCE STATUS: 2 - Symptomatic, <50% confined to bed  . Vitals:   08/08/23 1000  BP: 139/60  Pulse: 69  Resp: 18  Temp: 97.9 F (36.6 C)  SpO2: 99%   Filed Weights   08/08/23 1000  Weight: 159 lb 8 oz (72.3 kg)   .Body mass index is 32.22 kg/m.  GENERAL:alert, in no acute distress and comfortable SKIN: no acute rashes, no significant lesions EYES: conjunctiva are pink and non-injected, sclera anicteric OROPHARYNX: MMM, no exudates, no oropharyngeal erythema or ulceration NECK: supple, no JVD LYMPH:  no palpable lymphadenopathy in the cervical, axillary or inguinal regions LUNGS: clear to auscultation b/l with normal respiratory effort HEART: regular rate & rhythm ABDOMEN:  normoactive bowel sounds , non tender, not distended. Noo palpable hepatosplenomegaly Extremity: no pedal edema PSYCH: alert & oriented x 3 with fluent speech NEURO: no focal motor/sensory deficits  LABORATORY DATA:  I have reviewed the data as listed  .    Latest Ref Rng & Units 08/08/2023   10:57 AM 04/12/2023    9:56 AM 03/14/2022   12:31 PM  CBC  WBC 4.0 - 10.5 K/uL 5.9  7.1  7.0   Hemoglobin 12.0 - 15.0 g/dL 40.9  81.1  91.4   Hematocrit 36.0 - 46.0 % 38.3  39.9  42.5   Platelets 150 - 400 K/uL 272  239.0  279.0     .    Latest Ref Rng & Units 08/08/2023   10:57 AM 06/04/2023    3:35 PM 05/14/2023    3:12 PM  CMP  Glucose 70 - 99 mg/dL 94   782   BUN 8 - 23 mg/dL 25   15   Creatinine 9.56 - 1.00 mg/dL 2.13  0.86  5.78   Sodium 135 - 145 mmol/L 142   143   Potassium 3.5 - 5.1 mmol/L 3.7   3.8   Chloride 98 - 111 mmol/L 107   103   CO2 22 - 32 mmol/L 27   24   Calcium 8.9 - 10.3 mg/dL 9.7   9.8   Total Protein 6.5 - 8.1 g/dL 7.0     Total Bilirubin 0.3 - 1.2 mg/dL 0.6     Alkaline Phos 38 - 126 U/L 54     AST 15 - 41 U/L 19     ALT 0 - 44 U/L 13       08/05/2023 [facility] pathology:    07/31/2023 pathology:    RADIOGRAPHIC STUDIES: I have  personally reviewed the radiological images as listed and agreed with the findings in the report. CUP PACEART REMOTE DEVICE CHECK  Result Date: 07/13/2023 Scheduled remote reviewed. Normal device function.  1 AHR detection consistent with AF lasting 1 min 18 sec.  Known history of AF, no OAC per Epic. Next remote 91 days. - CS, CVRS   ASSESSMENT & PLAN:  87 y.o. female with   Newly diagnosed low-grade follicular lymphoma  Presented as a nodule over the cheek PLAN:  -patient received an additional opinion at [facility]. Pathology did show consistent findings with low-grade folicular lymphoma -educated patient on details of lymphomas, and particularly low-grade follicular lymphoma -discussed details of staging of lymphomas. Discussed that even with stage 4 diease, the condition would be very treatable.  -would plan to treat only if the disease is bothersome. Factors to consider when considering whether  to initiate treatment would include whether there is any bulky disease pushing on important organs, any effects on blood counts, disease is extensive, or production of cytokines producing constitutional symptoms -there may be a role to only monitor her condition without any treatment depending on PET scan results -educated patient that acid suppressants may cause low B12 levels -educated patient that scoliosis may cause mild breathing restrictions -educated patient that acid reflux is a common cause of chronic dry cough -educated patient that 50% of the time, neropathy may be idiopathic -educated patient that optimizing nutrition may improve energy levels. Continue to monitor diet. Would recommend avoiding excess liquid intake with meals. Would recommend for patient to drink water between meals.  -continue vitamin B complex supplements -recommend PET scan after her vacation to evaluate extent of disease -if PET scan shows that there is significant disease remaining which is bothersome and localized,  local radiation may be considered. If patient has more disease and is symptomatic, there may be a role for targeted antibody treatment.  -will order blood tests today, including CBC to evaluate if her condition is affecting other blood counts, LDH to evaluate extent of disease, and hepatitis testing -will plan to reconnect in 3 weeks to discuss results of PET scan  . Orders Placed This Encounter  Procedures   NM PET Image Initial (PI) Skull Base To Thigh    Standing Status:   Future    Standing Expiration Date:   08/07/2024    Order Specific Question:   If indicated for the ordered procedure, I authorize the administration of a radiopharmaceutical per Radiology protocol    Answer:   Yes    Order Specific Question:   Preferred imaging location?    Answer:   [place]   CBC with Differential (Cancer Center Only)    Standing Status:   Future    Number of Occurrences:   1    Standing Expiration Date:   08/07/2024   CMP (Cancer Center only)    Standing Status:   Future    Number of Occurrences:   1    Standing Expiration Date:   08/07/2024   Lactate dehydrogenase    Standing Status:   Future    Number of Occurrences:   1    Standing Expiration Date:   08/07/2024   Hepatitis C antibody    Standing Status:   Future    Number of Occurrences:   1    Standing Expiration Date:   08/07/2024   Hepatitis B core antibody, total    Standing Status:   Future    Number of Occurrences:   1    Standing Expiration Date:   08/07/2024   Hepatitis B surface antigen    Standing Status:   Future    Number of Occurrences:   1    Standing Expiration Date:   08/07/2024   HIV antibody (with reflex)    Standing Status:   Future    Number of Occurrences:   1    Standing Expiration Date:   08/07/2024    FOLLOW-UP: Labs today PET/CT in 7-10 days RTC with Dr Candise Che in 3 weeks  The total time spent in the appointment was 60 minutes* .  All of the patient's questions were answered with apparent satisfaction.  The patient knows to call the clinic with any problems, questions or concerns.   Wyvonnia Lora MD MS AAHIVMS [facility] Hematology/Oncology Physician [facility]  .*Total Encounter Time as  defined by the Centers for Medicare and Medicaid Services includes, in addition to the face-to-face time of a patient visit (documented in the note above) non-face-to-face time: obtaining and reviewing outside history, ordering and reviewing medications, tests or procedures, care coordination (communications with other health care professionals or caregivers) and documentation in the medical record.    I,Mitra Faeizi,acting as a Neurosurgeon for Wyvonnia Lora, MD.,have documented all relevant documentation on the behalf of Wyvonnia Lora, MD,as directed by  Wyvonnia Lora, MD while in the presence of Wyvonnia Lora, MD.  .I have reviewed the above documentation for accuracy and completeness, and I agree with the above. Johney Maine MD

## 2023-08-20 ENCOUNTER — Telehealth: Payer: Self-pay | Admitting: Internal Medicine

## 2023-08-20 NOTE — Telephone Encounter (Signed)
Error

## 2023-08-20 NOTE — Telephone Encounter (Signed)
Patient is requesting call back from Dr. Odessa Fleming nurse. Did not want to give further information.

## 2023-08-20 NOTE — Telephone Encounter (Signed)
Spoke with pt who states she was under the impression that she would be receiving monthly remote checks after her last OV with Dr Graciela Husbands in July 2024.  Pt states she is not scheduled for another one until November.  Pt advised will forward to our device nurses for review and will follow up with her.  Pt reassured with last remote check her battery had 7 months of longevity.  Pt verbalizes understanding and thanked Charity fundraiser for the call.

## 2023-08-21 NOTE — Telephone Encounter (Signed)
Returned call to Pt.  Scheduled Pt for monthly battery checks.  Scheduled follow up April 2025 with Mardelle Matte for gen change.

## 2023-08-22 ENCOUNTER — Ambulatory Visit (HOSPITAL_COMMUNITY)
Admission: RE | Admit: 2023-08-22 | Discharge: 2023-08-22 | Disposition: A | Discharge status: Home / Self Care | Payer: Medicare PPO | Source: Ambulatory Visit | Attending: Hematology | Admitting: Hematology

## 2023-08-22 DIAGNOSIS — C8291 Follicular lymphoma, unspecified, lymph nodes of head, face, and neck: Secondary | ICD-10-CM | POA: Insufficient documentation

## 2023-08-22 LAB — GLUCOSE, CAPILLARY: Glucose-Capillary: 100 mg/dL — ABNORMAL HIGH (ref 70–99)

## 2023-08-22 MED ORDER — FLUDEOXYGLUCOSE F - 18 (FDG) INJECTION
7.9000 | Freq: Once | INTRAVENOUS | Status: AC
  Administered 2023-08-22: 8.2 via INTRAVENOUS

## 2023-08-28 NOTE — Progress Notes (Signed)
HEMATOLOGY/ONCOLOGY CLINIC NOTE  Date of Service: 08/29/2023  Patient Care Team: Shelva Majestic, MD as PCP - General (Family Medicine)  CHIEF COMPLAINTS/PURPOSE OF CONSULTATION:  Evaluation and management of Newly diagnosed low-grade follicular lymphoma   HISTORY OF PRESENTING ILLNESS:   Kylie Patrick is a wonderful 87 y.o. female who has been referred to [place] by Paulita Fujita, MD for evaluation and management of low-grade follicular lymphoma.   She was seen by Paulita Fujita, MD on 06/04/2023 and reported a mouth mass at least 2 x 2 cm in front of left lower jaw.   Today, she is accompanied by her daughter-in-law and niece. She reports that she noticed the small nodule in her left cheek after resting her cheek in her palm. The nodule appeared relatively quickly and did not change over time. She has not had any significantly issues with the nodule. Patient also reports noticing a small nodule in her inner right cheek. She denies any other lumps/bumps in any other areas.   Patient denies any  major vision issues, chest pain, abdominal pain, change bowel habits, change in urination, new leg swelling, unexplained fever, chills, night sweats, weight loss, or significant new fatigue. Patient does sometimes endorse swallowing issues only at night.   She reports having neuropathy in her bilateral LEs. Patient denies any history of DM. Her symptoms began with nerve damage between her toes. Her nerve damage is stable at this time. However, her neuropathy has radiated to ankles. She manages her neuropathy with gabapentin.   Patient regularly takes her blood pressure medication and her BP is well controlled.   She reports a 20-pound weight loss attributed to stress over the last two years related to the loss of close family members including her son from lung cancer and her husband over the last couple of years. Patient reports having a lack of appetite likely related to anxiety and  stressful factors.   She denies any family history of lupus, RA, Sjogren's or other autoimmune disorders. Patient denies any issues with dry mouth or dry eyes. Patient denies any thyroid issues or history of stroke.   Patient reports that she had mouth surgery several years ago after a fall causing teeth injury. Patient does have acid reflux which is managed by medication. Patient denies any new medications.   Patient does have a lipoma in her back. She notes mild scoliosis on previous imaging. Her sister and aunt have similar scoliosis issues.   She regularly takes vitamin B complex, fish oil, calcium, magnesium. She takes vitamin b12 regularly due to having low levels on recent labs. She previously took folic acid supplements, but has not recently due to running out.   She reports being very easily fatigued, which is a recent change over the last 3 months. Patient reports that she is more easily SOB recently, especially on exertion. She was seen by an EP on 07/12/2023 and reports that she requires continuous pacing and her pacemaker will have to be changed soon.   Patient is planning to travel to the beach from 08/12/2023 to 09/16/2023.   INTERVAL HISTORY:  Kylie Patrick is a 87 y.o. female here for continued evaluation and management of newly diagnosed low-grade follicular lymphoma. She was last seen by me on 08/08/2023 and reported noticing a small nodule in her left cheek and inner right cheek. Patient complained of swallowing issues some nights, neuropathy in bilateral lower extremities, 20-pound weight loss in two years attributed to stress,  lack of appetite, lipoma in back, fatigue, and SOB.  Today, she is accompanied by an additional family member. Patient reports no new symptoms since her last clinical visit.   She continues to feel a small nodule at this time. Patient reports some soreness in the side of her mouth. She was seen by dentist recently and reports findings of redness in her  inner cheek. Patient denies any abdominal pain or leg swelling.   MEDICAL HISTORY:  Past Medical History:  Diagnosis Date   Anxiety    Colon polyp 2003   Complication of anesthesia    slow to wake up one time   Gastroenteritis    w/ renal insufficiency in context of protracted nausea & vomitting 2006   GERD (gastroesophageal reflux disease)    H/O hiatal hernia    Headache(784.0)    occasional lifelong- gabapentin helps a   Hiatal hernia    Hyperlipidemia    Hypertension    w/ LVH on ECHO   Pacemaker 07/2016   Tonsillitis 2006    SURGICAL HISTORY: Past Surgical History:  Procedure Laterality Date   ABDOMINAL HYSTERECTOMY  1976   BSO for endometriosis and bengin tumor   APPENDECTOMY  1970   BREAST LUMPECTOMY Left 70's   X 2   BUNIONECTOMY Bilateral    CATARACT EXTRACTION W/PHACO Left 05/14/2013   Procedure: CATARACT EXTRACTION PHACO AND INTRAOCULAR LENS PLACEMENT (IOC);  Surgeon: Gemma Payor, MD;  Location: [facility];  Service: Ophthalmology;  Laterality: Left;  CDE:9.76   CATARACT EXTRACTION W/PHACO Right 06/08/2013   Procedure: CATARACT EXTRACTION PHACO AND INTRAOCULAR LENS PLACEMENT (IOC);  Surgeon: Gemma Payor, MD;  Location: [facility];  Service: Ophthalmology;  Laterality: Right;  CDE 13.65   CHOLECYSTECTOMY  1970   COLONOSCOPY W/ POLYPECTOMY  2003   Dr Leone Payor   EP IMPLANTABLE DEVICE N/A 07/18/2016   Procedure: Pacemaker Implant;  Surgeon: Duke Salvia, MD;  Location: [facility] INVASIVE CV LAB;  Service: Cardiovascular;  Laterality: N/A;   ESOPHAGEAL MANOMETRY N/A 10/05/2013   Procedure: ESOPHAGEAL MANOMETRY (EM);  Surgeon: Iva Boop, MD;  Location: [facility];  Service: Endoscopy;  Laterality: N/A;   ESOPHAGOGASTRODUODENOSCOPY  02/05/13   Large Hiatal Hernia   HERNIA REPAIR     LAPAROSCOPIC NISSEN FUNDOPLICATION Bilateral 10/22/2013   Procedure: LAPAROSCOPIC NISSEN FUNDOPLICATION with hiatal hernia repair ;  Surgeon: Mariella Saa, MD;  Location: [facility];  Service:  General;  Laterality: Bilateral;    SOCIAL HISTORY: Social History   Socioeconomic History   Marital status: Widowed    Spouse name: Not on file   Number of children: 2   Years of education: Not on file   Highest education level: Not on file  Occupational History   Occupation: Retired    Associate Professor: RETIRED  Tobacco Use   Smoking status: Never   Smokeless tobacco: Never  Vaping Use   Vaping status: Never Used  Substance and Sexual Activity   Alcohol use: Yes    Comment: Wine-VERY RARELY   Drug use: No   Sexual activity: Not Currently  Other Topics Concern   Not on file  Social History Narrative   Lives alone, husband died 07/27/2023   Right Handed   Drinks caffeine rarely   Social Determinants of Health   Financial Resource Strain: Low Risk  (09/20/2022)   Overall Financial Resource Strain (CARDIA)    Difficulty of Paying Living Expenses: Not hard at all  Food Insecurity: No Food Insecurity (09/20/2022)   Hunger Vital  Sign    Worried About Programme researcher, broadcasting/film/video in the Last Year: Never true    Ran Out of Food in the Last Year: Never true  Transportation Needs: No Transportation Needs (09/20/2022)   PRAPARE - Administrator, Civil Service (Medical): No    Lack of Transportation (Non-Medical): No  Physical Activity: Inactive (09/20/2022)   Exercise Vital Sign    Days of Exercise per Week: 0 days    Minutes of Exercise per Session: 0 min  Stress: No Stress Concern Present (09/20/2022)   [employer] of Occupational Health - Occupational Stress Questionnaire    Feeling of Stress : Not at all  Social Connections: Socially Isolated (09/20/2022)   Social Connection and Isolation Panel [NHANES]    Frequency of Communication with Friends and Family: More than three times a week    Frequency of Social Gatherings with Friends and Family: More than three times a week    Attends Religious Services: Never    Database administrator or Organizations: No    Attends Tax inspector Meetings: Never    Marital Status: Widowed  Intimate Partner Violence: Not At Risk (09/20/2022)   Humiliation, Afraid, Rape, and Kick questionnaire    Fear of Current or Ex-Partner: No    Emotionally Abused: No    Physically Abused: No    Sexually Abused: No    FAMILY HISTORY: Family History  Problem Relation Age of Onset   Heart attack Father 15       fatal MI @ 32   Coronary artery disease Mother        CABG; MI @ 20   Diabetes Mother    Stroke Paternal Grandfather        > 57   Stroke Maternal Grandmother        in 52s   Aneurysm Paternal Grandmother        cns ; in 22s   Diabetes Maternal Aunt    Stomach cancer Maternal Aunt    Breast cancer Maternal Aunt    Heart disease Sister    Coronary artery disease Maternal Grandfather    Colon cancer Neg Hx    Esophageal cancer Neg Hx     ALLERGIES:  is allergic to chlorthalidone, hydrochlorothiazide, losartan potassium-hctz, norvasc [amlodipine besylate], raloxifene, and rofecoxib.  MEDICATIONS:  Current Outpatient Medications  Medication Sig Dispense Refill   ALPRAZolam (XANAX) 0.25 MG tablet Take 1 tablet (0.25 mg total) by mouth 2 (two) times daily as needed for anxiety (Do not drive for 8 hours after taking.  If any falls on medicine stop immediately and let us know). 30 tablet 0   Calcium in Bone Mineral Cmplx 350 MG MISC Take 3 each by mouth at bedtime. Takes 1 in the morning and 2 at night     carvedilol (COREG) 6.25 MG tablet Take 1.5 tablets (9.375 mg total) by mouth 2 (two) times daily with a meal. 270 tablet 3   Cholecalciferol (VITAMIN D3) 2000 UNITS TABS Take 1 tablet by mouth daily with lunch.     Cyanocobalamin (VITAMIN B-12 PO) Take 1 capsule by mouth daily at 2 am.     esomeprazole (NEXIUM) 40 MG capsule TAKE 1 CAPSULE BY MOUTH TWICE A DAY BEFORE A MEAL 180 capsule 3   ezetimibe-simvastatin (VYTORIN) 10-20 MG tablet TAKE 1 TABLET BY MOUTH EVERYDAY AT BEDTIME 90 tablet 2   famotidine (PEPCID) 40 MG  tablet TAKE 1 TABLET BY MOUTH EVERYDAY AT BEDTIME 90  tablet 3   folic acid (FOLVITE) 400 MCG tablet Take 400 mcg by mouth every morning.     furosemide (LASIX) 40 MG tablet TAKE 1 TABLET BY MOUTH EVERY DAY 90 tablet 3   gabapentin (NEURONTIN) 100 MG capsule Take 1 capsule (100 mg total) by mouth at bedtime. 90 capsule 3   KLOR-CON M20 20 MEQ tablet TAKE 1 TABLET (20 MEQ TOTAL) BY MOUTH DAILY AS NEEDED. 90 tablet 1   losartan (COZAAR) 25 MG tablet TAKE 1 TABLET (25 MG TOTAL) BY MOUTH DAILY. 90 tablet 3   Magnesium 400 MG CAPS Take 400 mg by mouth at bedtime.     Omega-3 Fatty Acids (FISH OIL) 500 MG CAPS Take 500 mg by mouth daily with lunch.     No current facility-administered medications for this visit.    REVIEW OF SYSTEMS:    10 Point review of Systems was done is negative except as noted above.   PHYSICAL EXAMINATION: ECOG PERFORMANCE STATUS: 2 - Symptomatic, <50% confined to bed  . Vitals:   08/29/23 1251  BP: (!) 127/55  Pulse: 63  Resp: 18  SpO2: 100%    Filed Weights   08/29/23 1251  Weight: 159 lb 9.6 oz (72.4 kg)    .Body mass index is 32.24 kg/m.  GENERAL:alert, in no acute distress and comfortable SKIN: no acute rashes, no significant lesions EYES: conjunctiva are pink and non-injected, sclera anicteric OROPHARYNX: MMM, no exudates, no oropharyngeal erythema or ulceration NECK: supple, no JVD LYMPH:  no palpable lymphadenopathy in the cervical, axillary or inguinal regions LUNGS: clear to auscultation b/l with normal respiratory effort HEART: regular rate & rhythm ABDOMEN:  normoactive bowel sounds , non tender, not distended. Extremity: no pedal edema PSYCH: alert & oriented x 3 with fluent speech NEURO: no focal motor/sensory deficits    LABORATORY DATA:  I have reviewed the data as listed  .    Latest Ref Rng & Units 08/08/2023   10:57 AM 04/12/2023    9:56 AM 03/14/2022   12:31 PM  CBC  WBC 4.0 - 10.5 K/uL 5.9  7.1  7.0   Hemoglobin 12.0 - 15.0  g/dL 57.8  46.9  62.9   Hematocrit 36.0 - 46.0 % 38.3  39.9  42.5   Platelets 150 - 400 K/uL 272  239.0  279.0     .    Latest Ref Rng & Units 08/08/2023   10:57 AM 06/04/2023    3:35 PM 05/14/2023    3:12 PM  CMP  Glucose 70 - 99 mg/dL 94   528   BUN 8 - 23 mg/dL 25   15   Creatinine 4.13 - 1.00 mg/dL 2.44  0.10  2.72   Sodium 135 - 145 mmol/L 142   143   Potassium 3.5 - 5.1 mmol/L 3.7   3.8   Chloride 98 - 111 mmol/L 107   103   CO2 22 - 32 mmol/L 27   24   Calcium 8.9 - 10.3 mg/dL 9.7   9.8   Total Protein 6.5 - 8.1 g/dL 7.0     Total Bilirubin 0.3 - 1.2 mg/dL 0.6     Alkaline Phos 38 - 126 U/L 54     AST 15 - 41 U/L 19     ALT 0 - 44 U/L 13       08/05/2023 [facility] pathology:    07/31/2023 pathology:    RADIOGRAPHIC STUDIES: I have personally reviewed the radiological images as listed  and agreed with the findings in the report. No results found.  ASSESSMENT & PLAN:  87 y.o. female with   Newly diagnosed low-grade follicular lymphoma  Presented as a nodule over the left cheek  PLAN:  -Discussed lab results from 08/08/2023 in detail with patient. CBC normal, showed WBC of 5.9K, hemoglobin of 12.5, and platelets of 272K. -Viral workup was negative -LDH normal -CMP shows stable CKD  -CT scan on June 04, 2023 showed a 1.6 x 1.1 cm soft tissue mass along the left mandible  -Discussed results of PET scan from 08/22/2023, which showed no visible disease on. "1. No abnormal hypermetabolic activity in the LEFT buccal space to correspond to the mass lesion described on comparison CT. 2. No hypermetabolic lymph nodes in the neck. 3. No evidence of visceral metastasis or skeletal metastasis." -discussed that her left cheek fleshy tissue is likely remaining scar tissue at this time -discussed that her initial scan and pathology sample measured about the same size, which suggests that a good sample was obtained, and I am unsure how much is remaining from that particular nodule. We  shall continue to monitor.  -Discussed potential proceeding options including: Localized radiation therapy by radiation oncologist if there is some lymphoma remaining Monitoring regularly at this time and considering radiation therapy down the line if needed, which would be reasonable. Discussed that her condition is slow-growing.  -will hold off on radiation therapy at this time -advised patient to continue to monitor cheek nodule and let us know if she notices any changes -discussed that there may be a role for a repeat biopsy if there are any changes -patient shall return to clinic in 4-6 months. If findings are stable, there may be a role to extend visits to once a year -continue to follow with dentist on a regular basis. Her next dental visit is reportedly in January 2025  FOLLOW-UP: RTC with Dr Candise Che with labs in 4 months  The total time spent in the appointment was 40 minutes* .  All of the patient's questions were answered with apparent satisfaction. The patient knows to call the clinic with any problems, questions or concerns.   Wyvonnia Lora MD MS AAHIVMS [facility] Hematology/Oncology Physician [facility]  .*Total Encounter Time as defined by the Centers for Medicare and Medicaid Services includes, in addition to the face-to-face time of a patient visit (documented in the note above) non-face-to-face time: obtaining and reviewing outside history, ordering and reviewing medications, tests or procedures, care coordination (communications with other health care professionals or caregivers) and documentation in the medical record.    I,Mitra Faeizi,acting as a Neurosurgeon for Wyvonnia Lora, MD.,have documented all relevant documentation on the behalf of Wyvonnia Lora, MD,as directed by  Wyvonnia Lora, MD while in the presence of Wyvonnia Lora, MD.  .I have reviewed the above documentation for accuracy and completeness, and I agree with the above. Johney Maine MD

## 2023-08-29 ENCOUNTER — Other Ambulatory Visit: Payer: Self-pay

## 2023-08-29 ENCOUNTER — Inpatient Hospital Stay: Payer: Medicare PPO | Attending: Hematology | Admitting: Hematology

## 2023-08-29 VITALS — BP 127/55 | HR 63 | Resp 18 | Wt 159.6 lb

## 2023-08-29 DIAGNOSIS — Z8249 Family history of ischemic heart disease and other diseases of the circulatory system: Secondary | ICD-10-CM | POA: Diagnosis not present

## 2023-08-29 DIAGNOSIS — Z823 Family history of stroke: Secondary | ICD-10-CM | POA: Insufficient documentation

## 2023-08-29 DIAGNOSIS — G629 Polyneuropathy, unspecified: Secondary | ICD-10-CM | POA: Insufficient documentation

## 2023-08-29 DIAGNOSIS — Z800 Family history of malignant neoplasm of digestive organs: Secondary | ICD-10-CM | POA: Diagnosis not present

## 2023-08-29 DIAGNOSIS — I129 Hypertensive chronic kidney disease with stage 1 through stage 4 chronic kidney disease, or unspecified chronic kidney disease: Secondary | ICD-10-CM | POA: Diagnosis not present

## 2023-08-29 DIAGNOSIS — E785 Hyperlipidemia, unspecified: Secondary | ICD-10-CM | POA: Insufficient documentation

## 2023-08-29 DIAGNOSIS — Z7189 Other specified counseling: Secondary | ICD-10-CM

## 2023-08-29 DIAGNOSIS — R634 Abnormal weight loss: Secondary | ICD-10-CM | POA: Diagnosis not present

## 2023-08-29 DIAGNOSIS — C8291 Follicular lymphoma, unspecified, lymph nodes of head, face, and neck: Secondary | ICD-10-CM | POA: Diagnosis not present

## 2023-08-29 DIAGNOSIS — Z803 Family history of malignant neoplasm of breast: Secondary | ICD-10-CM | POA: Diagnosis not present

## 2023-08-29 DIAGNOSIS — R5383 Other fatigue: Secondary | ICD-10-CM | POA: Diagnosis not present

## 2023-08-29 DIAGNOSIS — N189 Chronic kidney disease, unspecified: Secondary | ICD-10-CM | POA: Insufficient documentation

## 2023-08-29 DIAGNOSIS — Z9049 Acquired absence of other specified parts of digestive tract: Secondary | ICD-10-CM | POA: Insufficient documentation

## 2023-08-29 DIAGNOSIS — D171 Benign lipomatous neoplasm of skin and subcutaneous tissue of trunk: Secondary | ICD-10-CM | POA: Diagnosis not present

## 2023-08-29 DIAGNOSIS — M419 Scoliosis, unspecified: Secondary | ICD-10-CM | POA: Diagnosis not present

## 2023-08-29 DIAGNOSIS — Z79899 Other long term (current) drug therapy: Secondary | ICD-10-CM | POA: Insufficient documentation

## 2023-08-29 DIAGNOSIS — Z6832 Body mass index (BMI) 32.0-32.9, adult: Secondary | ICD-10-CM | POA: Diagnosis not present

## 2023-08-29 DIAGNOSIS — F419 Anxiety disorder, unspecified: Secondary | ICD-10-CM | POA: Diagnosis not present

## 2023-08-29 DIAGNOSIS — Z90710 Acquired absence of both cervix and uterus: Secondary | ICD-10-CM | POA: Diagnosis not present

## 2023-08-29 DIAGNOSIS — R0602 Shortness of breath: Secondary | ICD-10-CM | POA: Diagnosis not present

## 2023-08-29 DIAGNOSIS — Z860100 Personal history of colon polyps, unspecified: Secondary | ICD-10-CM | POA: Diagnosis not present

## 2023-08-29 DIAGNOSIS — Z90722 Acquired absence of ovaries, bilateral: Secondary | ICD-10-CM | POA: Insufficient documentation

## 2023-08-29 DIAGNOSIS — K219 Gastro-esophageal reflux disease without esophagitis: Secondary | ICD-10-CM | POA: Diagnosis not present

## 2023-08-29 DIAGNOSIS — Z833 Family history of diabetes mellitus: Secondary | ICD-10-CM | POA: Insufficient documentation

## 2023-09-17 ENCOUNTER — Telehealth: Payer: Self-pay | Admitting: Hematology

## 2023-09-17 NOTE — Telephone Encounter (Signed)
Patient is aware of scheduled appointment times/dates for follow up

## 2023-09-24 ENCOUNTER — Ambulatory Visit: Payer: Medicare PPO | Admitting: Family Medicine

## 2023-09-24 ENCOUNTER — Other Ambulatory Visit: Payer: Self-pay

## 2023-09-24 VITALS — BP 112/66 | HR 63 | Ht 59.0 in | Wt 159.0 lb

## 2023-09-24 DIAGNOSIS — G5601 Carpal tunnel syndrome, right upper limb: Secondary | ICD-10-CM | POA: Diagnosis not present

## 2023-09-24 NOTE — Patient Instructions (Addendum)
Thank you for coming in today.   You received an injection today. Seek immediate medical attention if the joint becomes red, extremely painful, or is oozing fluid.   We can do that shot again if you need me to in 3 months.   Recheck as needed.   Consider filling out that advanced directive and getting it back to Dr Durene Cal.

## 2023-09-24 NOTE — Progress Notes (Signed)
Rubin Payor, PhD, LAT, ATC acting as a scribe for [name], MD.  Kylie Patrick is a 87 y.o. female who presents to [facility] at [facility] today for cont'd R wrist pain. Pt was last seen by Dr. Denyse Amass on 01/09/23 and was given a R carpal tunnel steroid injection and advised to wear her night splints and to take 100mg  Gabapentin at bedtime prn.  Today, pt reports she canceled her f/u visit in May because she was doing pretty good. R wrist pain has flared up again over the last several months. Numbness/tingling present. She reports really wanting an injection today and isn't quite ready to consider surgery.  Social determinants of health: In the interval since I last saw Kylie Patrick her husband had a series of strokes that required nursing home care and ultimately led to his death.  She is currently living alone at home but is doing pretty well.  Pertinent review of systems: No fevers or chills  Relevant historical information: Heart block with pacemaker.   Exam:  BP 112/66   Pulse 63   Ht 4\' 11"  (1.499 m)   Wt 159 lb (72.1 kg)   SpO2 98%   BMI 32.11 kg/m  General: Well Developed, well nourished, and in no acute distress.   MSK: Right hand normal-appearing Positive Tinel's at carpal tunnel.  Normal grip strength.    Lab and Radiology Results  Procedure: Real-time Ultrasound Guided median nerve hydrodissection right carpal tunnel Device: Philips Affiniti 50G/GE Logiq Images permanently stored and available for review in PACS Verbal informed consent obtained.  Discussed risks and benefits of procedure. Warned about infection, bleeding, hyperglycemia damage to structures among others. Patient expresses understanding and agreement Time-out conducted.   Noted no overlying erythema, induration, or other signs of local infection.   Skin prepped in a sterile fashion.   Local anesthesia: Topical Ethyl chloride.   With sterile technique and under real time  ultrasound guidance: 40 Milligrams of Kenalog and 1 mL of lidocaine injected into carpal tunnel around the median nerve. Fluid seen entering the carpal tunnel.   Completed without difficulty   Pain immediately resolved suggesting accurate placement of the medication.   Advised to call if fevers/chills, erythema, induration, drainage, or persistent bleeding.   Images permanently stored and available for review in the ultrasound unit.  Impression: Technically successful ultrasound guided injection.        Assessment and Plan: 87 y.o. female with right carpal tunnel syndrome.  This is a acute exacerbation of a chronic problem.  Plan for repeat injection today.  Previous injection was February 2024.  Consider nerve conduction study if this injection does not work very well.  Check back as needed.  We also talked about her husband's passing since her last visit.  This naturally led to a discussion about advanced directives for [place].  She does not want aggressive measures if she would not benefit from them.  Her son knows this to but she does not have a formal advanced directive.  I did print out the [place] advance directive form that she can fill out with her son and get back to her PCP.   PDMP not reviewed this encounter. Orders Placed This Encounter  Procedures   [place] LIMITED JOINT SPACE STRUCTURES UP RIGHT(NO LINKED CHARGES)    Order Specific Question:   Reason for Exam (SYMPTOM  OR DIAGNOSIS REQUIRED)    Answer:   right wrist pain    Order Specific Question:  Preferred imaging location?    Answer:    [facility]   No orders of the defined types were placed in this encounter.    Discussed warning signs or symptoms. Please see discharge instructions. Patient expresses understanding.   The above documentation has been reviewed and is accurate and complete [name], M.D.

## 2023-09-25 ENCOUNTER — Ambulatory Visit: Payer: Medicare PPO

## 2023-09-25 VITALS — Wt 159.0 lb

## 2023-09-25 DIAGNOSIS — Z0000 Encounter for general adult medical examination without abnormal findings: Secondary | ICD-10-CM

## 2023-09-25 NOTE — Progress Notes (Signed)
Subjective:   Kylie Patrick is a 87 y.o. female who presents for Medicare Annual (Subsequent) preventive examination.  Visit Complete: Virtual I connected with  Kylie Patrick on 09/25/23 by a audio enabled telemedicine application and verified that I am speaking with the correct person using two identifiers.  Patient Location: Home  Provider Location: Home Office  I discussed the limitations of evaluation and management by telemedicine. The patient expressed understanding and agreed to proceed.  Vital Signs: Because this visit was a virtual/telehealth visit, some criteria may be missing or patient reported. Any vitals not documented were not able to be obtained and vitals that have been documented are patient reported.   Cardiac Risk Factors include: advanced age (>73men, >71 women);dyslipidemia;hypertension;obesity (BMI >30kg/m2)     Objective:    Today's Vitals   09/25/23 1056  Weight: 159 lb (72.1 kg)   Body mass index is 32.11 kg/m.     09/25/2023   11:00 AM 09/20/2022    2:05 PM 03/11/2020    9:04 AM 04/24/2017   10:57 AM 07/17/2016    6:00 PM 07/17/2016    2:06 PM 07/08/2016    3:29 PM  Advanced Directives  Does Patient Have a Medical Advance Directive? Yes Yes Yes No No No No  Type of Estate agent of [place];Living will Healthcare Power of [place];Living will Healthcare Power of Attorney      Does patient want to make changes to medical advance directive?   No - Patient declined      Copy of Healthcare Power of Attorney in Chart? No - copy requested No - copy requested No - copy requested      Would patient like information on creating a medical advance directive?     No - patient declined information No - patient declined information     Current Medications (verified) Outpatient Encounter Medications as of 09/25/2023  Medication Sig   ALPRAZolam (XANAX) 0.25 MG tablet Take 1 tablet (0.25 mg total) by mouth 2 (two) times daily as needed for  anxiety (Do not drive for 8 hours after taking.  If any falls on medicine stop immediately and let us know).   Calcium in Bone Mineral Cmplx 350 MG MISC Take 3 each by mouth at bedtime. Takes 1 in the morning and 2 at night   carvedilol (COREG) 6.25 MG tablet Take 1.5 tablets (9.375 mg total) by mouth 2 (two) times daily with a meal.   Cholecalciferol (VITAMIN D3) 2000 UNITS TABS Take 1 tablet by mouth daily with lunch.   Cyanocobalamin (VITAMIN B-12 PO) Take 1 capsule by mouth daily at 2 am.   esomeprazole (NEXIUM) 40 MG capsule TAKE 1 CAPSULE BY MOUTH TWICE A DAY BEFORE A MEAL   ezetimibe-simvastatin (VYTORIN) 10-20 MG tablet TAKE 1 TABLET BY MOUTH EVERYDAY AT BEDTIME   famotidine (PEPCID) 40 MG tablet TAKE 1 TABLET BY MOUTH EVERYDAY AT BEDTIME   folic acid (FOLVITE) 400 MCG tablet Take 400 mcg by mouth every morning.   furosemide (LASIX) 40 MG tablet TAKE 1 TABLET BY MOUTH EVERY DAY   gabapentin (NEURONTIN) 100 MG capsule Take 1 capsule (100 mg total) by mouth at bedtime.   KLOR-CON M20 20 MEQ tablet TAKE 1 TABLET (20 MEQ TOTAL) BY MOUTH DAILY AS NEEDED.   losartan (COZAAR) 25 MG tablet TAKE 1 TABLET (25 MG TOTAL) BY MOUTH DAILY.   Magnesium 400 MG CAPS Take 400 mg by mouth at bedtime.   Omega-3 Fatty Acids (FISH  OIL) 500 MG CAPS Take 500 mg by mouth daily with lunch.   No facility-administered encounter medications on file as of 09/25/2023.    Allergies (verified) Chlorthalidone, Hydrochlorothiazide, Losartan potassium-hctz, Norvasc [amlodipine besylate], Raloxifene, and Rofecoxib   History: Past Medical History:  Diagnosis Date   Anxiety    Colon polyp 2003   Complication of anesthesia    slow to wake up one time   Gastroenteritis    w/ renal insufficiency in context of protracted nausea & vomitting 2006   GERD (gastroesophageal reflux disease)    H/O hiatal hernia    Headache(784.0)    occasional lifelong- gabapentin helps a   Hiatal hernia    Hyperlipidemia    Hypertension     w/ LVH on ECHO   Pacemaker 07/2016   Tonsillitis 2006   Past Surgical History:  Procedure Laterality Date   ABDOMINAL HYSTERECTOMY  1976   BSO for endometriosis and bengin tumor   APPENDECTOMY  1970   BREAST LUMPECTOMY Left 70's   X 2   BUNIONECTOMY Bilateral    CATARACT EXTRACTION W/PHACO Left 05/14/2013   Procedure: CATARACT EXTRACTION PHACO AND INTRAOCULAR LENS PLACEMENT (IOC);  Surgeon: Gemma Payor, MD;  Location: [facility];  Service: Ophthalmology;  Laterality: Left;  CDE:9.76   CATARACT EXTRACTION W/PHACO Right 06/08/2013   Procedure: CATARACT EXTRACTION PHACO AND INTRAOCULAR LENS PLACEMENT (IOC);  Surgeon: Gemma Payor, MD;  Location: [facility];  Service: Ophthalmology;  Laterality: Right;  CDE 13.65   CHOLECYSTECTOMY  1970   COLONOSCOPY W/ POLYPECTOMY  2003   Dr Leone Payor   EP IMPLANTABLE DEVICE N/A 07/18/2016   Procedure: Pacemaker Implant;  Surgeon: Duke Salvia, MD;  Location: [facility] CV [facility];  Service: Cardiovascular;  Laterality: N/A;   ESOPHAGEAL MANOMETRY N/A 10/05/2013   Procedure: ESOPHAGEAL MANOMETRY (EM);  Surgeon: Iva Boop, MD;  Location: [facility];  Service: Endoscopy;  Laterality: N/A;   ESOPHAGOGASTRODUODENOSCOPY  02/05/13   Large Hiatal Hernia   HERNIA REPAIR     LAPAROSCOPIC NISSEN FUNDOPLICATION Bilateral 10/22/2013   Procedure: LAPAROSCOPIC NISSEN FUNDOPLICATION with hiatal hernia repair ;  Surgeon: Mariella Saa, MD;  Location: [facility];  Service: General;  Laterality: Bilateral;   Family History  Problem Relation Age of Onset   Heart attack Father 3       fatal MI @ 87   Coronary artery disease Mother        CABG; MI @ 107   Diabetes Mother    Stroke Paternal Grandfather        > 73   Stroke Maternal Grandmother        in 56s   Aneurysm Paternal Grandmother        cns ; in 74s   Diabetes Maternal Aunt    Stomach cancer Maternal Aunt    Breast cancer Maternal Aunt    Heart disease Sister    Coronary artery disease Maternal Grandfather     Colon cancer Neg Hx    Esophageal cancer Neg Hx    Social History   Socioeconomic History   Marital status: Widowed    Spouse name: Not on file   Number of children: 2   Years of education: Not on file   Highest education level: Not on file  Occupational History   Occupation: Retired    Associate Professor: RETIRED  Tobacco Use   Smoking status: Never   Smokeless tobacco: Never  Vaping Use   Vaping status: Never Used  Substance and Sexual Activity  Alcohol use: Yes    Comment: Wine-VERY RARELY   Drug use: No   Sexual activity: Not Currently  Other Topics Concern   Not on file  Social History Narrative   Lives alone, husband died 2023-07-18   Right Handed   Drinks caffeine rarely   Social Determinants of Health   Financial Resource Strain: Low Risk  (09/25/2023)   Overall Financial Resource Strain (CARDIA)    Difficulty of Paying Living Expenses: Not hard at all  Food Insecurity: No Food Insecurity (09/25/2023)   Hunger Vital Sign    Worried About Running Out of Food in the Last Year: Never true    Ran Out of Food in the Last Year: Never true  Transportation Needs: No Transportation Needs (09/25/2023)   PRAPARE - Administrator, Civil Service (Medical): No    Lack of Transportation (Non-Medical): No  Physical Activity: Inactive (09/25/2023)   Exercise Vital Sign    Days of Exercise per Week: 0 days    Minutes of Exercise per Session: 0 min  Stress: No Stress Concern Present (09/25/2023)   [employer] of Occupational Health - Occupational Stress Questionnaire    Feeling of Stress : Not at all  Social Connections: Moderately Isolated (09/25/2023)   Social Connection and Isolation Panel [NHANES]    Frequency of Communication with Friends and Family: More than three times a week    Frequency of Social Gatherings with Friends and Family: More than three times a week    Attends Religious Services: 1 to 4 times per year    Active Member of [employer] or Organizations: No     Attends Banker Meetings: Never    Marital Status: Widowed    Tobacco Counseling Counseling given: Not Answered   Clinical Intake:  Pre-visit preparation completed: Yes  Pain : No/denies pain     BMI - recorded: 32.11 Nutritional Status: BMI > 30  Obese Nutritional Risks: None Diabetes: No  How often do you need to have someone help you when you read instructions, pamphlets, or other written materials from your doctor or pharmacy?: 1 - Never  Interpreter Needed?: No  Information entered by :: Lanier Ensign, LPN   Activities of Daily Living    09/25/2023   10:57 AM  In your present state of health, do you have any difficulty performing the following activities:  Hearing? 0  Vision? 0  Difficulty concentrating or making decisions? 0  Walking or climbing stairs? 0  Dressing or bathing? 0  Doing errands, shopping? 0  Preparing Food and eating ? N  Using the Toilet? N  In the past six months, have you accidently leaked urine? N  Do you have problems with loss of bowel control? N  Managing your Medications? N  Managing your Finances? N  Housekeeping or managing your Housekeeping? N    Patient Care Team: Shelva Majestic, MD as PCP - General (Family Medicine)  Indicate any recent Medical Services you may have received from other than [facility] providers in the past year (date may be approximate).     Assessment:   This is a routine wellness examination for [place].  Hearing/Vision screen Hearing Screening - Comments:: Pt denies any hearing issues  Vision Screening - Comments:: Pt follows up with Dr Charise Killian for annual eye exams    Goals Addressed             This Visit's Progress    Patient Stated  Lose weight        Depression Screen    09/25/2023   10:59 AM 04/12/2023    9:01 AM 09/20/2022    2:02 PM 08/27/2022    2:42 PM 04/05/2022   11:00 AM 03/31/2021    8:39 AM 10/10/2020    1:21 PM  PHQ 2/9 Scores  PHQ - 2 Score 0 1 0 0  1 3 0  PHQ- 9 Score  2   4 8      Fall Risk    09/25/2023   11:01 AM 04/12/2023    9:01 AM 09/20/2022    2:05 PM 06/19/2021    1:39 PM 03/31/2021    8:15 AM  Fall Risk   Falls in the past year? 0 0 0 0 0  Number falls in past yr: 0 0 0 0 0  Injury with Fall? 0 0 0 0 0  Risk for fall due to : Impaired balance/gait No Fall Risks Impaired vision No Fall Risks   Follow up Falls prevention discussed Falls prevention discussed Falls prevention discussed Falls evaluation completed     MEDICARE RISK AT HOME: Medicare Risk at Home Any stairs in or around the home?: No If so, are there any without handrails?: No Home free of loose throw rugs in walkways, pet beds, electrical cords, etc?: Yes Adequate lighting in your home to reduce risk of falls?: Yes Life alert?: No Use of a cane, walker or w/c?: Yes Grab bars in the bathroom?: Yes Shower chair or bench in shower?: Yes Elevated toilet seat or a handicapped toilet?: No  TIMED UP AND GO:  Was the test performed?  No    Cognitive Function:        09/20/2022    2:06 PM 03/11/2020    9:07 AM  6CIT Screen  What Year? 0 points 0 points  What month? 0 points 0 points  What time? 0 points 0 points  Count back from 20 0 points 0 points  Months in reverse 0 points 0 points  Repeat phrase 0 points 0 points  Total Score 0 points 0 points    Immunizations Immunization History  Administered Date(s) Administered   Fluad Quad(high Dose 65+) 07/21/2019, 08/30/2020, 08/01/2022   Influenza Split 10/08/2011   Influenza Whole 09/06/2009, 10/02/2010   Influenza, High Dose Seasonal PF 09/10/2013, 10/10/2015, 10/10/2016, 10/24/2017, 08/18/2018, 07/28/2019, 08/15/2021, 07/27/2023   Influenza,inj,Quad PF,6+ Mos 10/04/2014   Influenza-Unspecified 08/18/2018   PFIZER(Purple Top)SARS-COV-2 Vaccination 12/17/2019, 01/07/2020, 08/18/2020   Pneumococcal Conjugate-13 10/25/2015   Pneumococcal Polysaccharide-23 10/10/2010   Tdap 10/10/2016   Zoster  Recombinant(Shingrix) 07/21/2019, 09/13/2019, 10/16/2019   Zoster, Live 01/09/2013    TDAP status: Up to date  Flu Vaccine status: Up to date  Pneumococcal vaccine status: Up to date  Covid-19 vaccine status: Information provided on how to obtain vaccines.   Qualifies for Shingles Vaccine? Yes   Zostavax completed Yes   Shingrix Completed?: Yes  Screening Tests Health Maintenance  Topic Date Due   COVID-19 Vaccine (4 - 2023-24 season) 07/14/2023   Medicare Annual Wellness (AWV)  09/24/2024   DTaP/Tdap/Td (2 - Td or Tdap) 10/10/2026   Pneumonia Vaccine 49+ Years old  Completed   INFLUENZA VACCINE  Completed   DEXA SCAN  Completed   Zoster Vaccines- Shingrix  Completed   HPV VACCINES  Aged Out    Health Maintenance  Health Maintenance Due  Topic Date Due   COVID-19 Vaccine (4 - 2023-24 season) 07/14/2023    Colorectal cancer  screening: No longer required.   Mammogram status: No longer required due to age .  Bone Density status: Completed 10/03/22. Results reflect: Bone density results: OSTEOPOROSIS. Repeat every 2 years.  Additional Screening:   Vision Screening: Recommended annual ophthalmology exams for early detection of glaucoma and other disorders of the eye. Is the patient up to date with their annual eye exam?  Yes  Who is the provider or what is the name of the office in which the patient attends annual eye exams? Dr Charise Killian  If pt is not established with a provider, would they like to be referred to a provider to establish care? No .   Dental Screening: Recommended annual dental exams for proper oral hygiene   Community Resource Referral / Chronic Care Management: CRR required this visit?  No   CCM required this visit?  No     Plan:     I have personally reviewed and noted the following in the patient's chart:   Medical and social history Use of alcohol, tobacco or illicit drugs  Current medications and supplements including opioid  prescriptions. Patient is not currently taking opioid prescriptions. Functional ability and status Nutritional status Physical activity Advanced directives List of other physicians Hospitalizations, surgeries, and ER visits in previous 12 months Vitals Screenings to include cognitive, depression, and falls Referrals and appointments  In addition, I have reviewed and discussed with patient certain preventive protocols, quality metrics, and best practice recommendations. A written personalized care plan for preventive services as well as general preventive health recommendations were provided to patient.     Marzella Schlein, LPN   [id number]/08/[id number]   After Visit Summary: (Declined) Due to this being a telephonic visit, with patients personalized plan was offered to patient but patient Declined AVS at this time   Nurse Notes: none

## 2023-09-25 NOTE — Patient Instructions (Signed)
Ms. Aceves , Thank you for taking time to come for your Medicare Wellness Visit. I appreciate your ongoing commitment to your health goals. Please review the following plan we discussed and let me know if I can assist you in the future.   Referrals/Orders/Follow-Ups/Clinician Recommendations: lose weight  Each day, aim for 6 glasses of water, plenty of protein in your diet and try to get up and walk/ stretch every hour for 5-10 minutes at a time.   Continue to lose weight    This is a list of the screening recommended for you and due dates:  Health Maintenance  Topic Date Due   COVID-19 Vaccine (4 - 2023-24 season) 07/14/2023   Medicare Annual Wellness Visit  09/24/2024   DTaP/Tdap/Td vaccine (2 - Td or Tdap) 10/10/2026   Pneumonia Vaccine  Completed   Flu Shot  Completed   DEXA scan (bone density measurement)  Completed   Zoster (Shingles) Vaccine  Completed   HPV Vaccine  Aged Out    Advanced directives: (Copy Requested) Please bring a copy of your health care power of attorney and living will to the office to be added to your chart at your convenience.  Next Medicare Annual Wellness Visit scheduled for next year: Yes

## 2023-09-30 NOTE — Progress Notes (Signed)
Subjective:   Kylie Patrick is a 87 y.o. female who presents for Medicare Annual (Subsequent) preventive examination.  Visit Complete: Virtual I connected with  [name] on 09/30/23 by a audio enabled telemedicine application and verified that I am speaking with the correct person using two identifiers.  Patient Location: Home  Provider Location: Home Office  I discussed the limitations of evaluation and management by telemedicine. The patient expressed understanding and agreed to proceed.  Vital Signs: Because this visit was a virtual/telehealth visit, some criteria may be missing or patient reported. Any vitals not documented were not able to be obtained and vitals that have been documented are patient reported.   Cardiac Risk Factors include: advanced age (>69men, >6 women);dyslipidemia;hypertension;obesity (BMI >30kg/m2)     Objective:    Today's Vitals   09/25/23 1056  Weight: 159 lb (72.1 kg)   Body mass index is 32.11 kg/m.     09/25/2023   11:00 AM 09/20/2022    2:05 PM 03/11/2020    9:04 AM 04/24/2017   10:57 AM 07/17/2016    6:00 PM 07/17/2016    2:06 PM 07/08/2016    3:29 PM  Advanced Directives  Does Patient Have a Medical Advance Directive? Yes Yes Yes No No No No  Type of Estate agent of [place];Living will Healthcare Power of [place];Living will Healthcare Power of Attorney      Does patient want to make changes to medical advance directive?   No - Patient declined      Copy of Healthcare Power of Attorney in Chart? No - copy requested No - copy requested No - copy requested      Would patient like information on creating a medical advance directive?     No - patient declined information No - patient declined information     Current Medications (verified) Outpatient Encounter Medications as of 09/25/2023  Medication Sig   ALPRAZolam (XANAX) 0.25 MG tablet Take 1 tablet (0.25 mg total) by mouth 2 (two) times daily as needed for  anxiety (Do not drive for 8 hours after taking.  If any falls on medicine stop immediately and let us know).   Calcium in Bone Mineral Cmplx 350 MG MISC Take 3 each by mouth at bedtime. Takes 1 in the morning and 2 at night   carvedilol (COREG) 6.25 MG tablet Take 1.5 tablets (9.375 mg total) by mouth 2 (two) times daily with a meal.   Cholecalciferol (VITAMIN D3) 2000 UNITS TABS Take 1 tablet by mouth daily with lunch.   Cyanocobalamin (VITAMIN B-12 PO) Take 1 capsule by mouth daily at 2 am.   esomeprazole (NEXIUM) 40 MG capsule TAKE 1 CAPSULE BY MOUTH TWICE A DAY BEFORE A MEAL   ezetimibe-simvastatin (VYTORIN) 10-20 MG tablet TAKE 1 TABLET BY MOUTH EVERYDAY AT BEDTIME   famotidine (PEPCID) 40 MG tablet TAKE 1 TABLET BY MOUTH EVERYDAY AT BEDTIME   folic acid (FOLVITE) 400 MCG tablet Take 400 mcg by mouth every morning.   furosemide (LASIX) 40 MG tablet TAKE 1 TABLET BY MOUTH EVERY DAY   gabapentin (NEURONTIN) 100 MG capsule Take 1 capsule (100 mg total) by mouth at bedtime.   KLOR-CON M20 20 MEQ tablet TAKE 1 TABLET (20 MEQ TOTAL) BY MOUTH DAILY AS NEEDED.   losartan (COZAAR) 25 MG tablet TAKE 1 TABLET (25 MG TOTAL) BY MOUTH DAILY.   Magnesium 400 MG CAPS Take 400 mg by mouth at bedtime.   Omega-3 Fatty Acids (FISH  OIL) 500 MG CAPS Take 500 mg by mouth daily with lunch.   No facility-administered encounter medications on file as of 09/25/2023.    Allergies (verified) Chlorthalidone, Hydrochlorothiazide, Losartan potassium-hctz, Norvasc [amlodipine besylate], Raloxifene, and Rofecoxib   History: Past Medical History:  Diagnosis Date   Anxiety    Colon polyp 2003   Complication of anesthesia    slow to wake up one time   Gastroenteritis    w/ renal insufficiency in context of protracted nausea & vomitting 2006   GERD (gastroesophageal reflux disease)    H/O hiatal hernia    Headache(784.0)    occasional lifelong- gabapentin helps a   Hiatal hernia    Hyperlipidemia    Hypertension     w/ LVH on ECHO   Pacemaker 07/2016   Tonsillitis 2006   Past Surgical History:  Procedure Laterality Date   ABDOMINAL HYSTERECTOMY  1976   BSO for endometriosis and bengin tumor   APPENDECTOMY  1970   BREAST LUMPECTOMY Left 70's   X 2   BUNIONECTOMY Bilateral    CATARACT EXTRACTION W/PHACO Left 05/14/2013   Procedure: CATARACT EXTRACTION PHACO AND INTRAOCULAR LENS PLACEMENT (IOC);  Surgeon: Gemma Payor, MD;  Location: [facility];  Service: Ophthalmology;  Laterality: Left;  CDE:9.76   CATARACT EXTRACTION W/PHACO Right 06/08/2013   Procedure: CATARACT EXTRACTION PHACO AND INTRAOCULAR LENS PLACEMENT (IOC);  Surgeon: Gemma Payor, MD;  Location: [facility];  Service: Ophthalmology;  Laterality: Right;  CDE 13.65   CHOLECYSTECTOMY  1970   COLONOSCOPY W/ POLYPECTOMY  2003   Dr Leone Payor   EP IMPLANTABLE DEVICE N/A 07/18/2016   Procedure: Pacemaker Implant;  Surgeon: Duke Salvia, MD;  Location: [facility] INVASIVE CV LAB;  Service: Cardiovascular;  Laterality: N/A;   ESOPHAGEAL MANOMETRY N/A 10/05/2013   Procedure: ESOPHAGEAL MANOMETRY (EM);  Surgeon: Iva Boop, MD;  Location: [facility];  Service: Endoscopy;  Laterality: N/A;   ESOPHAGOGASTRODUODENOSCOPY  02/05/13   Large Hiatal Hernia   HERNIA REPAIR     LAPAROSCOPIC NISSEN FUNDOPLICATION Bilateral 10/22/2013   Procedure: LAPAROSCOPIC NISSEN FUNDOPLICATION with hiatal hernia repair ;  Surgeon: Mariella Saa, MD;  Location: [facility];  Service: General;  Laterality: Bilateral;   Family History  Problem Relation Age of Onset   Heart attack Father 55       fatal MI @ 63   Coronary artery disease Mother        CABG; MI @ 74   Diabetes Mother    Stroke Paternal Grandfather        > 74   Stroke Maternal Grandmother        in 57s   Aneurysm Paternal Grandmother        cns ; in 35s   Diabetes Maternal Aunt    Stomach cancer Maternal Aunt    Breast cancer Maternal Aunt    Heart disease Sister    Coronary artery disease Maternal Grandfather     Colon cancer Neg Hx    Esophageal cancer Neg Hx    Social History   Socioeconomic History   Marital status: Widowed    Spouse name: Not on file   Number of children: 2   Years of education: Not on file   Highest education level: Not on file  Occupational History   Occupation: Retired    Associate Professor: RETIRED  Tobacco Use   Smoking status: Never   Smokeless tobacco: Never  Vaping Use   Vaping status: Never Used  Substance and Sexual Activity  Alcohol use: Yes    Comment: Wine-VERY RARELY   Drug use: No   Sexual activity: Not Currently  Other Topics Concern   Not on file  Social History Narrative   Lives alone, husband died 2023/07/16   Right Handed   Drinks caffeine rarely   Social Determinants of Health   Financial Resource Strain: Low Risk  (09/25/2023)   Overall Financial Resource Strain (CARDIA)    Difficulty of Paying Living Expenses: Not hard at all  Food Insecurity: No Food Insecurity (09/25/2023)   Hunger Vital Sign    Worried About Running Out of Food in the Last Year: Never true    Ran Out of Food in the Last Year: Never true  Transportation Needs: No Transportation Needs (09/25/2023)   PRAPARE - Administrator, Civil Service (Medical): No    Lack of Transportation (Non-Medical): No  Physical Activity: Inactive (09/25/2023)   Exercise Vital Sign    Days of Exercise per Week: 0 days    Minutes of Exercise per Session: 0 min  Stress: No Stress Concern Present (09/25/2023)   [employer] of Occupational Health - Occupational Stress Questionnaire    Feeling of Stress : Not at all  Social Connections: Moderately Isolated (09/25/2023)   Social Connection and Isolation Panel [NHANES]    Frequency of Communication with Friends and Family: More than three times a week    Frequency of Social Gatherings with Friends and Family: More than three times a week    Attends Religious Services: 1 to 4 times per year    Active Member of [employer] or Organizations: No     Attends Banker Meetings: Never    Marital Status: Widowed    Tobacco Counseling Counseling given: Not Answered   Clinical Intake:  Pre-visit preparation completed: Yes  Pain : No/denies pain     BMI - recorded: 32.11 Nutritional Status: BMI > 30  Obese Nutritional Risks: None Diabetes: No  How often do you need to have someone help you when you read instructions, pamphlets, or other written materials from your doctor or pharmacy?: 1 - Never  Interpreter Needed?: No  Information entered by :: Lanier Ensign, LPN   Activities of Daily Living    09/25/2023   10:57 AM  In your present state of health, do you have any difficulty performing the following activities:  Hearing? 0  Vision? 0  Difficulty concentrating or making decisions? 0  Walking or climbing stairs? 0  Dressing or bathing? 0  Doing errands, shopping? 0  Preparing Food and eating ? N  Using the Toilet? N  In the past six months, have you accidently leaked urine? N  Do you have problems with loss of bowel control? N  Managing your Medications? N  Managing your Finances? N  Housekeeping or managing your Housekeeping? N    Patient Care Team: Shelva Majestic, MD as PCP - General (Family Medicine)  Indicate any recent Medical Services you may have received from other than [facility] providers in the past year (date may be approximate).     Assessment:   This is a routine wellness examination for [place].  Hearing/Vision screen Hearing Screening - Comments:: Pt denies any hearing issues  Vision Screening - Comments:: Pt follows up with Dr Charise Killian for annual eye exams    Goals Addressed             This Visit's Progress    Patient Stated  Lose weight        Depression Screen    09/25/2023   10:59 AM 04/12/2023    9:01 AM 09/20/2022    2:02 PM 08/27/2022    2:42 PM 04/05/2022   11:00 AM 03/31/2021    8:39 AM 10/10/2020    1:21 PM  PHQ 2/9 Scores  PHQ - 2 Score 0 1 0 0  1 3 0  PHQ- 9 Score  2   4 8      Fall Risk    09/25/2023   11:01 AM 04/12/2023    9:01 AM 09/20/2022    2:05 PM 06/19/2021    1:39 PM 03/31/2021    8:15 AM  Fall Risk   Falls in the past year? 0 0 0 0 0  Number falls in past yr: 0 0 0 0 0  Injury with Fall? 0 0 0 0 0  Risk for fall due to : Impaired balance/gait No Fall Risks Impaired vision No Fall Risks   Follow up Falls prevention discussed Falls prevention discussed Falls prevention discussed Falls evaluation completed     MEDICARE RISK AT HOME: Medicare Risk at Home Any stairs in or around the home?: No If so, are there any without handrails?: No Home free of loose throw rugs in walkways, pet beds, electrical cords, etc?: Yes Adequate lighting in your home to reduce risk of falls?: Yes Life alert?: No Use of a cane, walker or w/c?: Yes Grab bars in the bathroom?: Yes Shower chair or bench in shower?: Yes Elevated toilet seat or a handicapped toilet?: No  TIMED UP AND GO:  Was the test performed?  No    Cognitive Function:        09/25/2023   11:56 AM 09/20/2022    2:06 PM 03/11/2020    9:07 AM  6CIT Screen  What Year? 0 points 0 points 0 points  What month? 0 points 0 points 0 points  What time? 0 points 0 points 0 points  Count back from 20 0 points 0 points 0 points  Months in reverse 0 points 0 points 0 points  Repeat phrase 0 points 0 points 0 points  Total Score 0 points 0 points 0 points    Immunizations Immunization History  Administered Date(s) Administered   Fluad Quad(high Dose 65+) 07/21/2019, 08/30/2020, 08/01/2022   Influenza Split 10/08/2011   Influenza Whole 09/06/2009, 10/02/2010   Influenza, High Dose Seasonal PF 09/10/2013, 10/10/2015, 10/10/2016, 10/24/2017, 08/18/2018, 07/28/2019, 08/15/2021, 07/27/2023   Influenza,inj,Quad PF,6+ Mos 10/04/2014   Influenza-Unspecified 08/18/2018   PFIZER(Purple Top)SARS-COV-2 Vaccination 12/17/2019, 01/07/2020, 08/18/2020   Pneumococcal Conjugate-13  10/25/2015   Pneumococcal Polysaccharide-23 10/10/2010   Tdap 10/10/2016   Zoster Recombinant(Shingrix) 07/21/2019, 09/13/2019, 10/16/2019   Zoster, Live 01/09/2013    TDAP status: Up to date  Flu Vaccine status: Up to date  Pneumococcal vaccine status: Up to date  Covid-19 vaccine status: Information provided on how to obtain vaccines.   Qualifies for Shingles Vaccine? Yes   Zostavax completed Yes   Shingrix Completed?: Yes  Screening Tests Health Maintenance  Topic Date Due   COVID-19 Vaccine (4 - 2023-24 season) 07/14/2023   Medicare Annual Wellness (AWV)  09/24/2024   DTaP/Tdap/Td (2 - Td or Tdap) 10/10/2026   Pneumonia Vaccine 25+ Years old  Completed   INFLUENZA VACCINE  Completed   DEXA SCAN  Completed   Zoster Vaccines- Shingrix  Completed   HPV VACCINES  Aged Out    Health Maintenance  Health Maintenance  Due  Topic Date Due   COVID-19 Vaccine (4 - 2023-24 season) 07/14/2023    Colorectal cancer screening: No longer required.   Mammogram status: No longer required due to age .  Bone Density status: Completed 10/03/22. Results reflect: Bone density results: OSTEOPOROSIS. Repeat every 2 years.  Additional Screening:   Vision Screening: Recommended annual ophthalmology exams for early detection of glaucoma and other disorders of the eye. Is the patient up to date with their annual eye exam?  Yes  Who is the provider or what is the name of the office in which the patient attends annual eye exams? Dr Charise Killian  If pt is not established with a provider, would they like to be referred to a provider to establish care? No .   Dental Screening: Recommended annual dental exams for proper oral hygiene   Community Resource Referral / Chronic Care Management: CRR required this visit?  No   CCM required this visit?  No     Plan:     I have personally reviewed and noted the following in the patient's chart:   Medical and social history Use of alcohol, tobacco  or illicit drugs  Current medications and supplements including opioid prescriptions. Patient is not currently taking opioid prescriptions. Functional ability and status Nutritional status Physical activity Advanced directives List of other physicians Hospitalizations, surgeries, and ER visits in previous 12 months Vitals Screenings to include cognitive, depression, and falls Referrals and appointments  In addition, I have reviewed and discussed with patient certain preventive protocols, quality metrics, and best practice recommendations. A written personalized care plan for preventive services as well as general preventive health recommendations were provided to patient.     Marzella Schlein, LPN   [id number]   After Visit Summary: (Declined) Due to this being a telephonic visit, with patients personalized plan was offered to patient but patient Declined AVS at this time   Nurse Notes: none

## 2023-10-07 ENCOUNTER — Telehealth: Payer: Self-pay | Admitting: Internal Medicine

## 2023-10-07 NOTE — Telephone Encounter (Signed)
Patient states she received a letter from [place] on 11/19 stating we did not get her 10/29 transmission??  I am not sure why the letter was sent because we did receive her transmission on 10/30.   Patient states that she is sending them on the 30th of the month.   I will forward to our CMA team to see why she may have gotten the letter.  Patient reassured all is well and we will continue monitoring her monthly.  She thanks me for the call.

## 2023-10-07 NOTE — Telephone Encounter (Signed)
Patient is requesting to speak with our device team. Please advise.

## 2023-10-09 ENCOUNTER — Ambulatory Visit: Payer: Medicare PPO | Admitting: Family Medicine

## 2023-10-09 ENCOUNTER — Encounter: Payer: Self-pay | Admitting: Family Medicine

## 2023-10-09 VITALS — BP 109/71 | HR 62 | Temp 97.2°F | Ht 59.0 in | Wt 157.0 lb

## 2023-10-09 DIAGNOSIS — E782 Mixed hyperlipidemia: Secondary | ICD-10-CM | POA: Diagnosis not present

## 2023-10-09 DIAGNOSIS — R739 Hyperglycemia, unspecified: Secondary | ICD-10-CM | POA: Diagnosis not present

## 2023-10-09 DIAGNOSIS — G629 Polyneuropathy, unspecified: Secondary | ICD-10-CM

## 2023-10-09 DIAGNOSIS — Z131 Encounter for screening for diabetes mellitus: Secondary | ICD-10-CM

## 2023-10-09 DIAGNOSIS — I10 Essential (primary) hypertension: Secondary | ICD-10-CM | POA: Diagnosis not present

## 2023-10-09 LAB — CBC WITH DIFFERENTIAL/PLATELET
Basophils Absolute: 0 10*3/uL (ref 0.0–0.1)
Basophils Relative: 0.8 % (ref 0.0–3.0)
Eosinophils Absolute: 0.3 10*3/uL (ref 0.0–0.7)
Eosinophils Relative: 4.7 % (ref 0.0–5.0)
HCT: 40.7 % (ref 36.0–46.0)
Hemoglobin: 13.2 g/dL (ref 12.0–15.0)
Lymphocytes Relative: 28 % (ref 12.0–46.0)
Lymphs Abs: 1.7 10*3/uL (ref 0.7–4.0)
MCHC: 32.3 g/dL (ref 30.0–36.0)
MCV: 91.7 fL (ref 78.0–100.0)
Monocytes Absolute: 0.6 10*3/uL (ref 0.1–1.0)
Monocytes Relative: 9.5 % (ref 3.0–12.0)
Neutro Abs: 3.5 10*3/uL (ref 1.4–7.7)
Neutrophils Relative %: 57 % (ref 43.0–77.0)
Platelets: 237 10*3/uL (ref 150.0–400.0)
RBC: 4.44 Mil/uL (ref 3.87–5.11)
RDW: 15.3 % (ref 11.5–15.5)
WBC: 6.2 10*3/uL (ref 4.0–10.5)

## 2023-10-09 LAB — COMPREHENSIVE METABOLIC PANEL
ALT: 12 U/L (ref 0–35)
AST: 18 U/L (ref 0–37)
Albumin: 4.3 g/dL (ref 3.5–5.2)
Alkaline Phosphatase: 59 U/L (ref 39–117)
BUN: 22 mg/dL (ref 6–23)
CO2: 30 meq/L (ref 19–32)
Calcium: 10.3 mg/dL (ref 8.4–10.5)
Chloride: 103 meq/L (ref 96–112)
Creatinine, Ser: 1.55 mg/dL — ABNORMAL HIGH (ref 0.40–1.20)
GFR: 30.39 mL/min — ABNORMAL LOW (ref >60.00)
Glucose, Bld: 100 mg/dL — ABNORMAL HIGH (ref 70–99)
Potassium: 3.4 meq/L — ABNORMAL LOW (ref 3.5–5.1)
Sodium: 142 meq/L (ref 135–145)
Total Bilirubin: 0.8 mg/dL (ref 0.2–1.2)
Total Protein: 6.9 g/dL (ref 6.0–8.3)

## 2023-10-09 LAB — HEMOGLOBIN A1C: Hgb A1c MFr Bld: 5.8 % (ref 4.6–6.5)

## 2023-10-09 MED ORDER — GABAPENTIN 100 MG PO CAPS: 200.0000 mg | ORAL_CAPSULE | Freq: Every day | ORAL | 3 refills | Status: DC

## 2023-10-09 NOTE — Assessment & Plan Note (Signed)
#  Neuropathy- has seen Dr. Teresa Coombs starting 08/10/21- taking gabapentin mainly as needed -Gabapentin 100mg  every 8 months perhaps- per neurology but per [place] has just been once a night  -ongoing chronic pain worse at night -we opted to trial gabapentin 200 mg at bedtime

## 2023-10-09 NOTE — Progress Notes (Signed)
Phone [phone] In person visit   Subjective:   Kylie Patrick is a 87 y.o. year old very pleasant female patient who presents for/with See problem oriented charting Chief Complaint  Patient presents with   Anxiety    Pt here for 6 mth f/u    Gastroesophageal Reflux   Hyperlipidemia    Past Medical History-  Patient Active Problem List   Diagnosis Date Noted   Atrial fibrillation (HCC) -[facility] 08/14/2022    Priority: High   Pacemaker - MDT 08/11/2020    Priority: High   Complete heart block (HCC) 04/05/2020    Priority: High   Neuropathy 10/09/2023    Priority: Medium    Aortic atherosclerosis (HCC) 04/26/2020    Priority: Medium    Osteoporosis 04/25/2020    Priority: Medium    Carpal tunnel syndrome, right 05/02/2019    Priority: Medium    Diastolic dysfunction 07/12/2016    Priority: Medium    Hyperglycemia 10/09/2014    Priority: Medium    GERD (gastroesophageal reflux disease) 10/22/2013    Priority: Medium    CKD (chronic kidney disease) stage 3, GFR 30-59 ml/min (HCC) 05/05/2008    Priority: Medium    Anxiety state 04/20/2008    Priority: Medium    HYPERLIPIDEMIA 08/12/2007    Priority: Medium    Essential hypertension 08/12/2007    Priority: Medium    Rash 07/04/2017    Priority: Low   Hiatal hernia 08/03/2013    Priority: Low   History of colonic polyps 01/09/2013    Priority: Low   NIGHT SWEATS 02/19/2008    Priority: Low   Exercise intolerance 08/14/2022    Medications- reviewed and updated Current Outpatient Medications  Medication Sig Dispense Refill   ALPRAZolam (XANAX) 0.25 MG tablet Take 1 tablet (0.25 mg total) by mouth 2 (two) times daily as needed for anxiety (Do not drive for 8 hours after taking.  If any falls on medicine stop immediately and let us know). 30 tablet 0   Calcium in Bone Mineral Cmplx 350 MG MISC Take 3 each by mouth at bedtime. Takes 1 in the morning and 2 at night     carvedilol (COREG) 6.25 MG tablet Take 1.5  tablets (9.375 mg total) by mouth 2 (two) times daily with a meal. 270 tablet 3   Cholecalciferol (VITAMIN D3) 2000 UNITS TABS Take 1 tablet by mouth daily with lunch.     Cyanocobalamin (VITAMIN B-12 PO) Take 1 capsule by mouth daily at 2 am.     esomeprazole (NEXIUM) 40 MG capsule TAKE 1 CAPSULE BY MOUTH TWICE A DAY BEFORE A MEAL 180 capsule 3   ezetimibe-simvastatin (VYTORIN) 10-20 MG tablet TAKE 1 TABLET BY MOUTH EVERYDAY AT BEDTIME 90 tablet 2   famotidine (PEPCID) 40 MG tablet TAKE 1 TABLET BY MOUTH EVERYDAY AT BEDTIME 90 tablet 3   furosemide (LASIX) 40 MG tablet TAKE 1 TABLET BY MOUTH EVERY DAY 90 tablet 3   gabapentin (NEURONTIN) 100 MG capsule Take 1 capsule (100 mg total) by mouth at bedtime. 90 capsule 3   KLOR-CON M20 20 MEQ tablet TAKE 1 TABLET (20 MEQ TOTAL) BY MOUTH DAILY AS NEEDED. 90 tablet 1   losartan (COZAAR) 25 MG tablet TAKE 1 TABLET (25 MG TOTAL) BY MOUTH DAILY. 90 tablet 3   Magnesium 400 MG CAPS Take 400 mg by mouth at bedtime.     Omega-3 Fatty Acids (FISH OIL) 500 MG CAPS Take 500 mg by mouth daily with lunch.  folic acid (FOLVITE) 400 MCG tablet Take 400 mcg by mouth every morning. (Patient not taking: Reported on 10/09/2023)     No current facility-administered medications for this visit.     Objective:  BP 109/71   Pulse 62   Temp (!) 97.2 F (36.2 C)   Ht 4\' 11"  (1.499 m)   Wt 157 lb (71.2 kg)   SpO2 99%   BMI 31.71 kg/m  Gen: NAD, resting comfortably CV: RRR no murmurs rubs or gallops Lungs: CTAB no crackles, wheeze, rhonchi Ext: minimal edema Skin: warm, dry     Assessment and Plan    # Carpal tunnel injection recently 09/24/2023- finds helpful  # Follicular lymphoma-followed by Dr. Manuela Schwartz removed and PET/CT reassuring-option of radiation but she is wanted to hold and recheck every 4 to 6 months - on exam only appears to be scar tissue- no changes- check cbc  % complete heart block- Led to pacemaker placement - follows with Dr.  Graciela Husbands yearly. q3 month checks at home  previously now to monthly. No palpitations or chest pain or shortness of breath. Battery running down so wil need to bre replaced #A fib- subclinical- cardiology monitoring- has not recommended anticoagulation. Continue to monitor   #hypertension S: medication:  Carvedilol 9.375 mg BID , Lasix 40mg  daily, losartan 25mg  (reduced by Dr. Kathrene Bongo from 100mg  originally to 25 mg) Home readings #s:112- 124  BP Readings from Last 3 Encounters:  10/09/23 109/71  09/24/23 112/66  08/29/23 (!) 127/55  A/P:stable- continue current medicines - discussed possibly cutting losartan in half  but likely too small  #hyperlipidemia/aortic atherosclerosis-LDL goal at least under 100-ideally under 70 but not increasing medicine for primary prevention S: Medication:  fish oil , Vytorin 10-20mg  Lab Results  Component Value Date   CHOL 155 04/12/2023   HDL 50.60 04/12/2023   LDLCALC 81 04/12/2023   TRIG 116.0 04/12/2023   CHOLHDL 3 04/12/2023  A/P:  lipids very close to ideal goal- continue current medications  Aortic atherosclerosis (presumed stable)- LDL goal ideally <70 - slightly above goal but at her age hold off on adjustments  # Anxiety-  Xanax once a week or so in past down to once a month   #Neuropathy- has seen Dr. Teresa Coombs starting 08/10/21- taking gabapentin mainly as needed -Gabapentin 100mg  every 8 months perhaps- per neurology but per [place] has just been once a night  -ongoing chronic pain worse at night -we opted to trial gabapentin 200 mg at bedtime  # Hyperglycemia/insulin resistance/prediabetes- peak a1c June 2020 at 6.4 S:  Medication: none  Lab Results  Component Value Date   HGBA1C 5.7 04/12/2023   HGBA1C 6.0 10/01/2022   HGBA1C 5.8 03/14/2022  A/P: hopefully stable- update a1c today. Continue without meds for now   #Osteoporosis- had improved last check- wants to hold off on medications- recheck 2025  % Chronic kidney disease Stage  III-follows with Dr. Kathrene Bongo S: knows to avoid nsaids.  A/P: update CMP with labs- suspect stable   % GERD-released from Dr. Leone Payor in January 2024 -s/p hiatal hernia surgery- bad experience S: Nexium 40mg  BID long-term plan with H2 blocker before bed.  A/P: reasonable control lately- continue current medications    #MUSCULOSKELETAL- works with Dr. Denyse Amass on Dr. Denyse Amass  #advanced directives- working on papers for this- given by Dr. Denyse Amass- DNR/DNI she reports   Recommended follow up: Return in about 27 weeks (around 04/15/2024) for physical or sooner if needed.Schedule b4 you leave. Future Appointments  Date  Time Provider Department Center  10/11/2023  7:10 AM [facility] DEVICE REMOTES [facility]  11/11/2023  7:55 AM [facility] DEVICE REMOTES [facility]  12/11/2023  7:45 AM [facility] DEVICE REMOTES [facility]  12/24/2023 11:30 AM [facility] None  12/24/2023 12:00 PM Johney Maine, MD [facility] None  01/10/2024  7:05 AM [facility] DEVICE REMOTES [facility]  02/07/2024  7:40 AM [facility] DEVICE REMOTES [facility]  02/11/2024  9:20 AM Graciella Freer, PA-C [facility]  03/09/2024  8:10 AM [facility] DEVICE REMOTES [facility]  04/10/2024  7:05 AM [facility] DEVICE REMOTES [facility]  05/11/2024  7:25 AM [facility] DEVICE REMOTES [facility]  07/10/2024  7:05 AM [facility] DEVICE REMOTES [facility]  10/01/2024 10:40 AM [facility] ANNUAL WELLNESS VISIT 1 [facility] PEC    Lab/Order associations:   ICD-10-CM   1. Essential hypertension  I10     2. HYPERLIPIDEMIA  E78.2     3. Neuropathy  G62.9     4. Hyperglycemia  R73.9     5. Screening for diabetes mellitus  Z13.1       No orders of the defined types were placed in this encounter.   Return precautions advised.  Tana Conch, MD

## 2023-10-09 NOTE — Patient Instructions (Addendum)
Increase gabapentin to 200 mg which is two of the 100 mg tablets  Please stop by lab before you go If you have mychart- we will send your results within 3 business days of [place] receiving them.  If you do not have mychart- we will call you about results within 5 business days of [place] receiving them.  *please also note that you will see labs on mychart as soon as they post. I will later go in and write notes on them- will say "notes from Dr. Durene Cal"   Recommended follow up: Return in about 27 weeks (around 04/15/2024) for physical or sooner if needed.Schedule b4 you leave.

## 2023-10-11 ENCOUNTER — Ambulatory Visit (INDEPENDENT_AMBULATORY_CARE_PROVIDER_SITE_OTHER): Payer: Medicare PPO

## 2023-10-11 DIAGNOSIS — I442 Atrioventricular block, complete: Secondary | ICD-10-CM

## 2023-10-12 LAB — CUP PACEART REMOTE DEVICE CHECK
Battery Remaining Longevity: 5 mo
Battery Voltage: 2.86 V
Brady Statistic AP VP Percent: 34.41 %
Brady Statistic AP VS Percent: 0 %
Brady Statistic AS VP Percent: 65.54 %
Brady Statistic AS VS Percent: 0.05 %
Brady Statistic RA Percent Paced: 34.35 %
Brady Statistic RV Percent Paced: 99.93 %
Date Time Interrogation Session: 20241129133712
Implantable Lead Connection Status: 753985
Implantable Lead Connection Status: 753985
Implantable Lead Implant Date: 20170906
Implantable Lead Implant Date: 20170906
Implantable Lead Location: 753859
Implantable Lead Location: 753860
Implantable Lead Model: 3830
Implantable Lead Model: 5076
Implantable Pulse Generator Implant Date: 20170906
Lead Channel Impedance Value: 323 Ohm
Lead Channel Impedance Value: 418 Ohm
Lead Channel Impedance Value: 418 Ohm
Lead Channel Impedance Value: 494 Ohm
Lead Channel Pacing Threshold Amplitude: 0.5 V
Lead Channel Pacing Threshold Amplitude: 1.5 V
Lead Channel Pacing Threshold Pulse Width: 0.4 ms
Lead Channel Pacing Threshold Pulse Width: 0.4 ms
Lead Channel Sensing Intrinsic Amplitude: 2 mV
Lead Channel Sensing Intrinsic Amplitude: 2 mV
Lead Channel Sensing Intrinsic Amplitude: 6.25 mV
Lead Channel Sensing Intrinsic Amplitude: 6.25 mV
Lead Channel Setting Pacing Amplitude: 2 V
Lead Channel Setting Pacing Amplitude: 2.5 V
Lead Channel Setting Pacing Pulse Width: 1 ms
Lead Channel Setting Sensing Sensitivity: 0.9 mV
Zone Setting Status: 755011
Zone Setting Status: 755011

## 2023-10-25 ENCOUNTER — Other Ambulatory Visit: Payer: Self-pay | Admitting: Family Medicine

## 2023-10-28 DIAGNOSIS — H04123 Dry eye syndrome of bilateral lacrimal glands: Secondary | ICD-10-CM | POA: Diagnosis not present

## 2023-11-01 ENCOUNTER — Other Ambulatory Visit: Payer: Self-pay | Admitting: Family Medicine

## 2023-11-11 ENCOUNTER — Ambulatory Visit (INDEPENDENT_AMBULATORY_CARE_PROVIDER_SITE_OTHER): Payer: Medicare PPO

## 2023-11-11 DIAGNOSIS — I442 Atrioventricular block, complete: Secondary | ICD-10-CM

## 2023-11-11 LAB — CUP PACEART REMOTE DEVICE CHECK
Battery Remaining Longevity: 3 mo
Battery Voltage: 2.85 V
Brady Statistic AP VP Percent: 24.34 %
Brady Statistic AP VS Percent: 0 %
Brady Statistic AS VP Percent: 75.64 %
Brady Statistic AS VS Percent: 0.02 %
Brady Statistic RA Percent Paced: 24.29 %
Brady Statistic RV Percent Paced: 99.97 %
Date Time Interrogation Session: 20241230102108
Implantable Lead Connection Status: 753985
Implantable Lead Connection Status: 753985
Implantable Lead Implant Date: 20170906
Implantable Lead Implant Date: 20170906
Implantable Lead Location: 753859
Implantable Lead Location: 753860
Implantable Lead Model: 3830
Implantable Lead Model: 5076
Implantable Pulse Generator Implant Date: 20170906
Lead Channel Impedance Value: 323 Ohm
Lead Channel Impedance Value: 399 Ohm
Lead Channel Impedance Value: 418 Ohm
Lead Channel Impedance Value: 475 Ohm
Lead Channel Pacing Threshold Amplitude: 0.5 V
Lead Channel Pacing Threshold Amplitude: 1.5 V
Lead Channel Pacing Threshold Pulse Width: 0.4 ms
Lead Channel Pacing Threshold Pulse Width: 0.4 ms
Lead Channel Sensing Intrinsic Amplitude: 2.25 mV
Lead Channel Sensing Intrinsic Amplitude: 2.25 mV
Lead Channel Sensing Intrinsic Amplitude: 6.375 mV
Lead Channel Sensing Intrinsic Amplitude: 6.375 mV
Lead Channel Setting Pacing Amplitude: 2 V
Lead Channel Setting Pacing Amplitude: 2.5 V
Lead Channel Setting Pacing Pulse Width: 1 ms
Lead Channel Setting Sensing Sensitivity: 0.9 mV
Zone Setting Status: 755011
Zone Setting Status: 755011

## 2023-12-11 ENCOUNTER — Ambulatory Visit (INDEPENDENT_AMBULATORY_CARE_PROVIDER_SITE_OTHER): Payer: Medicare PPO

## 2023-12-11 DIAGNOSIS — I442 Atrioventricular block, complete: Secondary | ICD-10-CM

## 2023-12-11 LAB — CUP PACEART REMOTE DEVICE CHECK
Battery Remaining Longevity: 2 mo
Battery Voltage: 2.85 V
Brady Statistic AP VP Percent: 27.99 %
Brady Statistic AP VS Percent: 0 %
Brady Statistic AS VP Percent: 72.01 %
Brady Statistic AS VS Percent: 0 %
Brady Statistic RA Percent Paced: 27.92 %
Brady Statistic RV Percent Paced: 99.99 %
Date Time Interrogation Session: 20250129094451
Implantable Lead Connection Status: 753985
Implantable Lead Connection Status: 753985
Implantable Lead Implant Date: 20170906
Implantable Lead Implant Date: 20170906
Implantable Lead Location: 753859
Implantable Lead Location: 753860
Implantable Lead Model: 3830
Implantable Lead Model: 5076
Implantable Pulse Generator Implant Date: 20170906
Lead Channel Impedance Value: 342 Ohm
Lead Channel Impedance Value: 437 Ohm
Lead Channel Impedance Value: 437 Ohm
Lead Channel Impedance Value: 513 Ohm
Lead Channel Pacing Threshold Amplitude: 0.5 V
Lead Channel Pacing Threshold Amplitude: 1.75 V
Lead Channel Pacing Threshold Pulse Width: 0.4 ms
Lead Channel Pacing Threshold Pulse Width: 0.4 ms
Lead Channel Sensing Intrinsic Amplitude: 2 mV
Lead Channel Sensing Intrinsic Amplitude: 2 mV
Lead Channel Sensing Intrinsic Amplitude: 5.625 mV
Lead Channel Sensing Intrinsic Amplitude: 5.625 mV
Lead Channel Setting Pacing Amplitude: 2 V
Lead Channel Setting Pacing Amplitude: 2.5 V
Lead Channel Setting Pacing Pulse Width: 1 ms
Lead Channel Setting Sensing Sensitivity: 0.9 mV
Zone Setting Status: 755011
Zone Setting Status: 755011

## 2023-12-17 ENCOUNTER — Other Ambulatory Visit: Payer: Self-pay | Admitting: Family Medicine

## 2023-12-23 ENCOUNTER — Other Ambulatory Visit: Payer: Self-pay

## 2023-12-23 DIAGNOSIS — C8291 Follicular lymphoma, unspecified, lymph nodes of head, face, and neck: Secondary | ICD-10-CM

## 2023-12-24 ENCOUNTER — Inpatient Hospital Stay: Payer: Medicare PPO | Attending: Hematology

## 2023-12-24 ENCOUNTER — Inpatient Hospital Stay: Payer: Medicare PPO | Admitting: Hematology

## 2023-12-24 ENCOUNTER — Telehealth: Payer: Self-pay | Admitting: Hematology

## 2023-12-24 VITALS — BP 134/59 | HR 84 | Temp 97.7°F | Resp 19 | Wt 155.7 lb

## 2023-12-24 DIAGNOSIS — Z6831 Body mass index (BMI) 31.0-31.9, adult: Secondary | ICD-10-CM | POA: Diagnosis not present

## 2023-12-24 DIAGNOSIS — Z823 Family history of stroke: Secondary | ICD-10-CM | POA: Diagnosis not present

## 2023-12-24 DIAGNOSIS — Z79899 Other long term (current) drug therapy: Secondary | ICD-10-CM | POA: Insufficient documentation

## 2023-12-24 DIAGNOSIS — D171 Benign lipomatous neoplasm of skin and subcutaneous tissue of trunk: Secondary | ICD-10-CM | POA: Insufficient documentation

## 2023-12-24 DIAGNOSIS — Z9049 Acquired absence of other specified parts of digestive tract: Secondary | ICD-10-CM | POA: Insufficient documentation

## 2023-12-24 DIAGNOSIS — G629 Polyneuropathy, unspecified: Secondary | ICD-10-CM | POA: Insufficient documentation

## 2023-12-24 DIAGNOSIS — Z800 Family history of malignant neoplasm of digestive organs: Secondary | ICD-10-CM | POA: Diagnosis not present

## 2023-12-24 DIAGNOSIS — Z803 Family history of malignant neoplasm of breast: Secondary | ICD-10-CM | POA: Diagnosis not present

## 2023-12-24 DIAGNOSIS — M419 Scoliosis, unspecified: Secondary | ICD-10-CM | POA: Insufficient documentation

## 2023-12-24 DIAGNOSIS — Z860100 Personal history of colon polyps, unspecified: Secondary | ICD-10-CM | POA: Insufficient documentation

## 2023-12-24 DIAGNOSIS — Z833 Family history of diabetes mellitus: Secondary | ICD-10-CM | POA: Diagnosis not present

## 2023-12-24 DIAGNOSIS — R630 Anorexia: Secondary | ICD-10-CM | POA: Insufficient documentation

## 2023-12-24 DIAGNOSIS — Z90710 Acquired absence of both cervix and uterus: Secondary | ICD-10-CM | POA: Insufficient documentation

## 2023-12-24 DIAGNOSIS — Z90722 Acquired absence of ovaries, bilateral: Secondary | ICD-10-CM | POA: Insufficient documentation

## 2023-12-24 DIAGNOSIS — Z8249 Family history of ischemic heart disease and other diseases of the circulatory system: Secondary | ICD-10-CM | POA: Diagnosis not present

## 2023-12-24 DIAGNOSIS — R634 Abnormal weight loss: Secondary | ICD-10-CM | POA: Diagnosis not present

## 2023-12-24 DIAGNOSIS — I10 Essential (primary) hypertension: Secondary | ICD-10-CM | POA: Diagnosis not present

## 2023-12-24 DIAGNOSIS — C8291 Follicular lymphoma, unspecified, lymph nodes of head, face, and neck: Secondary | ICD-10-CM | POA: Insufficient documentation

## 2023-12-24 DIAGNOSIS — E785 Hyperlipidemia, unspecified: Secondary | ICD-10-CM | POA: Diagnosis not present

## 2023-12-24 DIAGNOSIS — R5383 Other fatigue: Secondary | ICD-10-CM | POA: Insufficient documentation

## 2023-12-24 DIAGNOSIS — K219 Gastro-esophageal reflux disease without esophagitis: Secondary | ICD-10-CM | POA: Diagnosis not present

## 2023-12-24 LAB — CMP (CANCER CENTER ONLY)
ALT: 14 U/L (ref 0–44)
AST: 22 U/L (ref 15–41)
Albumin: 4.2 g/dL (ref 3.5–5.0)
Alkaline Phosphatase: 47 U/L (ref 38–126)
Anion gap: 9 (ref 5–15)
BUN: 14 mg/dL (ref 8–23)
CO2: 31 mmol/L (ref 22–32)
Calcium: 9.8 mg/dL (ref 8.9–10.3)
Chloride: 104 mmol/L (ref 98–111)
Creatinine: 1.48 mg/dL — ABNORMAL HIGH (ref 0.44–1.00)
GFR, Estimated: 34 mL/min — ABNORMAL LOW (ref >60)
Glucose, Bld: 99 mg/dL (ref 70–99)
Potassium: 3.1 mmol/L — ABNORMAL LOW (ref 3.5–5.1)
Sodium: 144 mmol/L (ref 135–145)
Total Bilirubin: 0.7 mg/dL (ref 0.0–1.2)
Total Protein: 7 g/dL (ref 6.5–8.1)

## 2023-12-24 LAB — CBC WITH DIFFERENTIAL (CANCER CENTER ONLY)
Abs Immature Granulocytes: 0.01 10*3/uL (ref 0.00–0.07)
Basophils Absolute: 0.1 10*3/uL (ref 0.0–0.1)
Basophils Relative: 1 %
Eosinophils Absolute: 0.2 10*3/uL (ref 0.0–0.5)
Eosinophils Relative: 3 %
HCT: 40.7 % (ref 36.0–46.0)
Hemoglobin: 13.1 g/dL (ref 12.0–15.0)
Immature Granulocytes: 0 %
Lymphocytes Relative: 28 %
Lymphs Abs: 2.1 10*3/uL (ref 0.7–4.0)
MCH: 29.3 pg (ref 26.0–34.0)
MCHC: 32.2 g/dL (ref 30.0–36.0)
MCV: 91.1 fL (ref 80.0–100.0)
Monocytes Absolute: 0.7 10*3/uL (ref 0.1–1.0)
Monocytes Relative: 9 %
Neutro Abs: 4.4 10*3/uL (ref 1.7–7.7)
Neutrophils Relative %: 59 %
Platelet Count: 241 10*3/uL (ref 150–400)
RBC: 4.47 MIL/uL (ref 3.87–5.11)
RDW: 14 % (ref 11.5–15.5)
WBC Count: 7.5 10*3/uL (ref 4.0–10.5)
nRBC: 0 % (ref 0.0–0.2)

## 2023-12-24 LAB — LACTATE DEHYDROGENASE: LDH: 210 U/L — ABNORMAL HIGH (ref 98–192)

## 2023-12-24 NOTE — Telephone Encounter (Signed)
Per 2/11 staff message, called the patient to confirm wanting to reschedule appointments. She stated she wanted to keep today's appointments.

## 2023-12-24 NOTE — Progress Notes (Signed)
HEMATOLOGY/ONCOLOGY CLINIC NOTE  Date of Service: 12/24/23   Patient Care Team: Shelva Majestic, MD as PCP - General (Family Medicine)  CHIEF COMPLAINTS/PURPOSE OF CONSULTATION:  Evaluation and management of Newly diagnosed low-grade follicular lymphoma   HISTORY OF PRESENTING ILLNESS:   Kylie Patrick is a wonderful 88 y.o. female who has been referred to [place] by Paulita Fujita, MD for evaluation and management of low-grade follicular lymphoma.   She was seen by Paulita Fujita, MD on 06/04/2023 and reported a mouth mass at least 2 x 2 cm in front of left lower jaw.   Today, she is accompanied by her daughter-in-law and niece. She reports that she noticed the small nodule in her left cheek after resting her cheek in her palm. The nodule appeared relatively quickly and did not change over time. She has not had any significantly issues with the nodule. Patient also reports noticing a small nodule in her inner right cheek. She denies any other lumps/bumps in any other areas.   Patient denies any  major vision issues, chest pain, abdominal pain, change bowel habits, change in urination, new leg swelling, unexplained fever, chills, night sweats, weight loss, or significant new fatigue. Patient does sometimes endorse swallowing issues only at night.   She reports having neuropathy in her bilateral LEs. Patient denies any history of DM. Her symptoms began with nerve damage between her toes. Her nerve damage is stable at this time. However, her neuropathy has radiated to ankles. She manages her neuropathy with gabapentin.   Patient regularly takes her blood pressure medication and her BP is well controlled.   She reports a 20-pound weight loss attributed to stress over the last two years related to the loss of close family members including her son from lung cancer and her husband over the last couple of years. Patient reports having a lack of appetite likely related to anxiety and  stressful factors.   She denies any family history of lupus, RA, Sjogren's or other autoimmune disorders. Patient denies any issues with dry mouth or dry eyes. Patient denies any thyroid issues or history of stroke.   Patient reports that she had mouth surgery several years ago after a fall causing teeth injury. Patient does have acid reflux which is managed by medication. Patient denies any new medications.   Patient does have a lipoma in her back. She notes mild scoliosis on previous imaging. Her sister and aunt have similar scoliosis issues.   She regularly takes vitamin B complex, fish oil, calcium, magnesium. She takes vitamin b12 regularly due to having low levels on recent labs. She previously took folic acid supplements, but has not recently due to running out.   She reports being very easily fatigued, which is a recent change over the last 3 months. Patient reports that she is more easily SOB recently, especially on exertion. She was seen by an EP on 07/12/2023 and reports that she requires continuous pacing and her pacemaker will have to be changed soon.   Patient is planning to travel to the beach from 08/12/2023 to 09/16/2023.   INTERVAL HISTORY:  Kylie Patrick is a 88 y.o. female here for continued evaluation and management of low-grade follicular lymphoma.   Patient was last seen by me on 08/29/2023 and she was doing well overall. She reported a small nodule in her mouth, which was removed by her Designer, industrial/product.   Patient notes she has been doing well overall since our last  visit. She notes that her lips occasionally feel "strange" after her dental surgery. She denies any new enlarged nodules in her mouth or anywhere else. She does complain of small scar tissue near inside of her mouth (lower left), which has not increased in size.    She denies any new infection issues, fever, chills, night sweats, unexpected weight loss, back pain, chest pain, abdominal pain, SOB, or leg  swelling. She does complain of bilateral leg neuropathy.  Patient notes she recently had a stomach virus around 3-4 weeks ago. Her symptoms included grade 2-3 diarrhea.   She is complaint with all of her medications. She takes potassium supplement.    MEDICAL HISTORY:  Past Medical History:  Diagnosis Date   Anxiety    Colon polyp 2003   Complication of anesthesia    slow to wake up one time   Gastroenteritis    w/ renal insufficiency in context of protracted nausea & vomitting 2006   GERD (gastroesophageal reflux disease)    H/O hiatal hernia    Headache(784.0)    occasional lifelong- gabapentin helps a   Hiatal hernia    Hyperlipidemia    Hypertension    w/ LVH on ECHO   Pacemaker 07/2016   Tonsillitis 2006    SURGICAL HISTORY: Past Surgical History:  Procedure Laterality Date   ABDOMINAL HYSTERECTOMY  1976   BSO for endometriosis and bengin tumor   APPENDECTOMY  1970   BREAST LUMPECTOMY Left 70's   X 2   BUNIONECTOMY Bilateral    CATARACT EXTRACTION W/PHACO Left 05/14/2013   Procedure: CATARACT EXTRACTION PHACO AND INTRAOCULAR LENS PLACEMENT (IOC);  Surgeon: Gemma Payor, MD;  Location: [facility];  Service: Ophthalmology;  Laterality: Left;  CDE:9.76   CATARACT EXTRACTION W/PHACO Right 06/08/2013   Procedure: CATARACT EXTRACTION PHACO AND INTRAOCULAR LENS PLACEMENT (IOC);  Surgeon: Gemma Payor, MD;  Location: [facility];  Service: Ophthalmology;  Laterality: Right;  CDE 13.65   CHOLECYSTECTOMY  1970   COLONOSCOPY W/ POLYPECTOMY  2003   Dr Leone Payor   EP IMPLANTABLE DEVICE N/A 07/18/2016   Procedure: Pacemaker Implant;  Surgeon: Duke Salvia, MD;  Location: [facility] CV [facility];  Service: Cardiovascular;  Laterality: N/A;   ESOPHAGEAL MANOMETRY N/A 10/05/2013   Procedure: ESOPHAGEAL MANOMETRY (EM);  Surgeon: Iva Boop, MD;  Location: [facility];  Service: Endoscopy;  Laterality: N/A;   ESOPHAGOGASTRODUODENOSCOPY  02/05/13   Large Hiatal Hernia   HERNIA REPAIR      LAPAROSCOPIC NISSEN FUNDOPLICATION Bilateral 10/22/2013   Procedure: LAPAROSCOPIC NISSEN FUNDOPLICATION with hiatal hernia repair ;  Surgeon: Mariella Saa, MD;  Location: [facility];  Service: General;  Laterality: Bilateral;    SOCIAL HISTORY: Social History   Socioeconomic History   Marital status: Widowed    Spouse name: Not on file   Number of children: 2   Years of education: Not on file   Highest education level: Not on file  Occupational History   Occupation: Retired    Associate Professor: RETIRED  Tobacco Use   Smoking status: Never   Smokeless tobacco: Never  Vaping Use   Vaping status: Never Used  Substance and Sexual Activity   Alcohol use: Yes    Comment: Wine-VERY RARELY   Drug use: No   Sexual activity: Not Currently  Other Topics Concern   Not on file  Social History Narrative   Lives alone, husband died 2024/07/20   Right Handed   Drinks caffeine rarely   Social Drivers of Health  Financial Resource Strain: Low Risk  (09/25/2023)   Overall Financial Resource Strain (CARDIA)    Difficulty of Paying Living Expenses: Not hard at all  Food Insecurity: No Food Insecurity (09/25/2023)   Hunger Vital Sign    Worried About Running Out of Food in the Last Year: Never true    Ran Out of Food in the Last Year: Never true  Transportation Needs: No Transportation Needs (09/25/2023)   PRAPARE - Administrator, Civil Service (Medical): No    Lack of Transportation (Non-Medical): No  Physical Activity: Inactive (09/25/2023)   Exercise Vital Sign    Days of Exercise per Week: 0 days    Minutes of Exercise per Session: 0 min  Stress: No Stress Concern Present (09/25/2023)   [employer] of Occupational Health - Occupational Stress Questionnaire    Feeling of Stress : Not at all  Social Connections: Moderately Isolated (09/25/2023)   Social Connection and Isolation Panel [NHANES]    Frequency of Communication with Friends and Family: More than three times a  week    Frequency of Social Gatherings with Friends and Family: More than three times a week    Attends Religious Services: 1 to 4 times per year    Active Member of [employer] or Organizations: No    Attends Banker Meetings: Never    Marital Status: Widowed  Intimate Partner Violence: Not At Risk (09/25/2023)   Humiliation, Afraid, Rape, and Kick questionnaire    Fear of Current or Ex-Partner: No    Emotionally Abused: No    Physically Abused: No    Sexually Abused: No    FAMILY HISTORY: Family History  Problem Relation Age of Onset   Heart attack Father 34       fatal MI @ 56   Coronary artery disease Mother        CABG; MI @ 52   Diabetes Mother    Stroke Paternal Grandfather        > 31   Stroke Maternal Grandmother        in 14s   Aneurysm Paternal Grandmother        cns ; in 51s   Diabetes Maternal Aunt    Stomach cancer Maternal Aunt    Breast cancer Maternal Aunt    Heart disease Sister    Coronary artery disease Maternal Grandfather    Colon cancer Neg Hx    Esophageal cancer Neg Hx     ALLERGIES:  is allergic to chlorthalidone, hydrochlorothiazide, losartan potassium-hctz, norvasc [amlodipine besylate], raloxifene, and rofecoxib.  MEDICATIONS:  Current Outpatient Medications  Medication Sig Dispense Refill   ALPRAZolam (XANAX) 0.25 MG tablet Take 1 tablet (0.25 mg total) by mouth 2 (two) times daily as needed for anxiety (Do not drive for 8 hours after taking.  If any falls on medicine stop immediately and let us know). 30 tablet 0   Calcium in Bone Mineral Cmplx 350 MG MISC Take 3 each by mouth at bedtime. Takes 1 in the morning and 2 at night     carvedilol (COREG) 6.25 MG tablet Take 1.5 tablets (9.375 mg total) by mouth 2 (two) times daily with a meal. 270 tablet 3   Cholecalciferol (VITAMIN D3) 2000 UNITS TABS Take 1 tablet by mouth daily with lunch.     Cyanocobalamin (VITAMIN B-12 PO) Take 1 capsule by mouth daily at 2 am.     esomeprazole  (NEXIUM) 40 MG capsule TAKE 1 CAPSULE BY MOUTH TWICE  A DAY BEFORE A MEAL 180 capsule 3   ezetimibe-simvastatin (VYTORIN) 10-20 MG tablet TAKE 1 TABLET BY MOUTH EVERYDAY AT BEDTIME 90 tablet 2   famotidine (PEPCID) 40 MG tablet TAKE 1 TABLET BY MOUTH EVERYDAY AT BEDTIME 90 tablet 3   folic acid (FOLVITE) 400 MCG tablet Take 400 mcg by mouth every morning. (Patient not taking: Reported on 10/09/2023)     furosemide (LASIX) 40 MG tablet TAKE 1 TABLET BY MOUTH EVERY DAY 90 tablet 3   gabapentin (NEURONTIN) 100 MG capsule Take 2 capsules (200 mg total) by mouth at bedtime. 180 capsule 3   KLOR-CON M20 20 MEQ tablet TAKE 1 TABLET (20 MEQ TOTAL) BY MOUTH DAILY AS NEEDED. 90 tablet 1   losartan (COZAAR) 25 MG tablet TAKE 1 TABLET (25 MG TOTAL) BY MOUTH DAILY. 90 tablet 3   Magnesium 400 MG CAPS Take 400 mg by mouth at bedtime.     Omega-3 Fatty Acids (FISH OIL) 500 MG CAPS Take 500 mg by mouth daily with lunch.     No current facility-administered medications for this visit.    REVIEW OF SYSTEMS:    10 Point review of Systems was done is negative except as noted above.   PHYSICAL EXAMINATION: ECOG PERFORMANCE STATUS: 2 - Symptomatic, <50% confined to bed  . Vitals:   12/24/23 1216  BP: (!) 134/59  Pulse: 84  Resp: 19  Temp: 97.7 F (36.5 C)  SpO2: 100%   Filed Weights   12/24/23 1216  Weight: 155 lb 11.2 oz (70.6 kg)   .Body mass index is 31.45 kg/m.  GENERAL:alert, in no acute distress and comfortable SKIN: no acute rashes, no significant lesions EYES: conjunctiva are pink and non-injected, sclera anicteric OROPHARYNX: MMM, no exudates, no oropharyngeal erythema or ulceration NECK: supple, no JVD LYMPH:  no palpable lymphadenopathy in the cervical, axillary or inguinal regions LUNGS: clear to auscultation b/l with normal respiratory effort HEART: regular rate & rhythm ABDOMEN:  normoactive bowel sounds , non tender, not distended. Extremity: no pedal edema PSYCH: alert &  oriented x 3 with fluent speech NEURO: no focal motor/sensory deficits    LABORATORY DATA:  I have reviewed the data as listed  .    Latest Ref Rng & Units 12/24/2023   11:31 AM 10/09/2023   10:37 AM 08/08/2023   10:57 AM  CBC  WBC 4.0 - 10.5 K/uL 7.5  6.2  5.9   Hemoglobin 12.0 - 15.0 g/dL 40.9  81.1  91.4   Hematocrit 36.0 - 46.0 % 40.7  40.7  38.3   Platelets 150 - 400 K/uL 241  237.0  272     .    Latest Ref Rng & Units 12/24/2023   11:31 AM 10/09/2023   10:37 AM 08/08/2023   10:57 AM  CMP  Glucose 70 - 99 mg/dL 99  782  94   BUN 8 - 23 mg/dL 14  22  25    Creatinine 0.44 - 1.00 mg/dL 9.56  2.13  0.86   Sodium 135 - 145 mmol/L 144  142  142   Potassium 3.5 - 5.1 mmol/L 3.1  3.4  3.7   Chloride 98 - 111 mmol/L 104  103  107   CO2 22 - 32 mmol/L 31  30  27    Calcium 8.9 - 10.3 mg/dL 9.8  57.8  9.7   Total Protein 6.5 - 8.1 g/dL 7.0  6.9  7.0   Total Bilirubin 0.0 - 1.2 mg/dL 0.7  0.8  0.6   Alkaline Phos 38 - 126 U/L 47  59  54   AST 15 - 41 U/L 22  18  19    ALT 0 - 44 U/L 14  12  13      08/05/2023 [facility] pathology:    07/31/2023 pathology:    RADIOGRAPHIC STUDIES: I have personally reviewed the radiological images as listed and agreed with the findings in the report. CUP PACEART REMOTE DEVICE CHECK Result Date: 12/11/2023 Monthly battery check.  Estimated 2 months. Normal device function. No new alerts. Follow up as scheduled monthly. [facility], [facility]   ASSESSMENT & PLAN:  88 y.o. female with   Recently diagnosed low-grade follicular lymphoma  Presented as a nodule over the left cheek  PLAN:  -Discussed lab results from today, 12/24/2023, in detail with the patient. CBC stable. CMP shows elevated creatinine of 1.48, but stable overall. Low potassium at 3.1.  -Recommend to take hr potassium medication 20 meq twice daily next couple of weeks. Recommend to eat potassium-rich food.  -Answered all of patient's questions.  -We do not need radiation treatment right now.   -Recommend to the patient to let us know if the scar tissue increases in size.   FOLLOW-UP: RTC with Dr Candise Che with labs in 6 months   The total time spent in the appointment was 20 minutes* .  All of the patient's questions were answered with apparent satisfaction. The patient knows to call the clinic with any problems, questions or concerns.   Wyvonnia Lora MD MS AAHIVMS [facility] [address] Hematology/Oncology Physician [facility]  .*Total Encounter Time as defined by the Centers for Medicare and Medicaid Services includes, in addition to the face-to-face time of a patient visit (documented in the note above) non-face-to-face time: obtaining and reviewing outside history, ordering and reviewing medications, tests or procedures, care coordination (communications with other health care professionals or caregivers) and documentation in the medical record.   I,Param Shah,acting as a Neurosurgeon for Wyvonnia Lora, MD.,have documented all relevant documentation on the behalf of Wyvonnia Lora, MD,as directed by  Wyvonnia Lora, MD while in the presence of Wyvonnia Lora, MD.  .I have reviewed the above documentation for accuracy and completeness, and I agree with the above. Johney Maine MD

## 2024-01-10 ENCOUNTER — Ambulatory Visit (INDEPENDENT_AMBULATORY_CARE_PROVIDER_SITE_OTHER): Payer: Medicare PPO

## 2024-01-10 DIAGNOSIS — I442 Atrioventricular block, complete: Secondary | ICD-10-CM | POA: Diagnosis not present

## 2024-01-13 LAB — CUP PACEART REMOTE DEVICE CHECK
Battery Remaining Longevity: 2 mo
Battery Voltage: 2.84 V
Brady Statistic AP VP Percent: 25.91 %
Brady Statistic AP VS Percent: 0 %
Brady Statistic AS VP Percent: 74.09 %
Brady Statistic AS VS Percent: 0.01 %
Brady Statistic RA Percent Paced: 25.86 %
Brady Statistic RV Percent Paced: 99.96 %
Date Time Interrogation Session: 20250228092631
Implantable Lead Connection Status: 753985
Implantable Lead Connection Status: 753985
Implantable Lead Implant Date: 20170906
Implantable Lead Implant Date: 20170906
Implantable Lead Location: 753859
Implantable Lead Location: 753860
Implantable Lead Model: 3830
Implantable Lead Model: 5076
Implantable Pulse Generator Implant Date: 20170906
Lead Channel Impedance Value: 323 Ohm
Lead Channel Impedance Value: 418 Ohm
Lead Channel Impedance Value: 456 Ohm
Lead Channel Impedance Value: 513 Ohm
Lead Channel Pacing Threshold Amplitude: 0.5 V
Lead Channel Pacing Threshold Amplitude: 1.625 V
Lead Channel Pacing Threshold Pulse Width: 0.4 ms
Lead Channel Pacing Threshold Pulse Width: 0.4 ms
Lead Channel Sensing Intrinsic Amplitude: 2 mV
Lead Channel Sensing Intrinsic Amplitude: 2 mV
Lead Channel Sensing Intrinsic Amplitude: 4.625 mV
Lead Channel Sensing Intrinsic Amplitude: 4.625 mV
Lead Channel Setting Pacing Amplitude: 2 V
Lead Channel Setting Pacing Amplitude: 2.5 V
Lead Channel Setting Pacing Pulse Width: 1 ms
Lead Channel Setting Sensing Sensitivity: 0.9 mV
Zone Setting Status: 755011
Zone Setting Status: 755011

## 2024-01-21 NOTE — Progress Notes (Signed)
 Remote pacemaker transmission.

## 2024-02-07 ENCOUNTER — Ambulatory Visit (INDEPENDENT_AMBULATORY_CARE_PROVIDER_SITE_OTHER): Payer: Medicare PPO

## 2024-02-07 DIAGNOSIS — I442 Atrioventricular block, complete: Secondary | ICD-10-CM

## 2024-02-10 LAB — CUP PACEART REMOTE DEVICE CHECK
Battery Remaining Longevity: 1 mo
Battery Voltage: 2.83 V
Brady Statistic AP VP Percent: 30.5 %
Brady Statistic AP VS Percent: 0 %
Brady Statistic AS VP Percent: 69.49 %
Brady Statistic AS VS Percent: 0.01 %
Brady Statistic RA Percent Paced: 30.44 %
Brady Statistic RV Percent Paced: 99.97 %
Date Time Interrogation Session: 20250328091045
Implantable Lead Connection Status: 753985
Implantable Lead Connection Status: 753985
Implantable Lead Implant Date: 20170906
Implantable Lead Implant Date: 20170906
Implantable Lead Location: 753859
Implantable Lead Location: 753860
Implantable Lead Model: 3830
Implantable Lead Model: 5076
Implantable Pulse Generator Implant Date: 20170906
Lead Channel Impedance Value: 323 Ohm
Lead Channel Impedance Value: 437 Ohm
Lead Channel Impedance Value: 437 Ohm
Lead Channel Impedance Value: 532 Ohm
Lead Channel Pacing Threshold Amplitude: 0.5 V
Lead Channel Pacing Threshold Amplitude: 1.875 V
Lead Channel Pacing Threshold Pulse Width: 0.4 ms
Lead Channel Pacing Threshold Pulse Width: 0.4 ms
Lead Channel Sensing Intrinsic Amplitude: 2.375 mV
Lead Channel Sensing Intrinsic Amplitude: 2.375 mV
Lead Channel Sensing Intrinsic Amplitude: 4 mV
Lead Channel Sensing Intrinsic Amplitude: 4 mV
Lead Channel Setting Pacing Amplitude: 2 V
Lead Channel Setting Pacing Amplitude: 2.5 V
Lead Channel Setting Pacing Pulse Width: 1 ms
Lead Channel Setting Sensing Sensitivity: 0.9 mV
Zone Setting Status: 755011
Zone Setting Status: 755011

## 2024-02-10 NOTE — Addendum Note (Signed)
 Addended by: Elease Etienne A on: 02/10/2024 12:19 PM   Modules accepted: Orders

## 2024-02-10 NOTE — Progress Notes (Unsigned)
  Electrophysiology Office Note:   ID:  Kylie Patrick, DOB 10-13-1936, MRN [id number]  Primary Cardiologist: None Electrophysiologist: Sherryl Manges, MD  {Click to update primary MD,subspecialty MD or APP then REFRESH:1}    History of Present Illness:   Kylie Patrick is a 88 y.o. female with h/o CHB s/p PPM, h/o syncope, HTN, and subclinical AF seen today for routine electrophysiology followup.   Since last being seen in our clinic the patient reports doing ***.  she denies chest pain, palpitations, dyspnea, PND, orthopnea, nausea, vomiting, dizziness, syncope, edema, weight gain, or early satiety.   Review of systems complete and found to be negative unless listed in HPI.   EP Information / Studies Reviewed:    EKG is ordered today. Personal review as below.       PPM Interrogation-  reviewed in detail today,  See PACEART report.  Arrhythmia/Device History Dual Chamber MDT PPM 2017 for CHB   Physical Exam:   VS:  There were no vitals taken for this visit.   Wt Readings from Last 3 Encounters:  12/24/23 155 lb 11.2 oz (70.6 kg)  10/09/23 157 lb (71.2 kg)  09/25/23 159 lb (72.1 kg)     GEN: No acute distress  NECK: No JVD; No carotid bruits CARDIAC: {EPRHYTHM:28826}, no murmurs, rubs, gallops RESPIRATORY:  Clear to auscultation without rales, wheezing or rhonchi  ABDOMEN: Soft, non-tender, non-distended EXTREMITIES:  {EDEMA LEVEL:28147::"No"} edema; No deformity   ASSESSMENT AND PLAN:    CHB s/p Medtronic PPM  Normal PPM function See Pace Art report No changes today  HFpEF Volume status *** EF 55-60% 05/2023  HTN Stable on current regimen   Subclinical AF ***  {Click here to Review PMH, Prob List, Meds, Allergies, SHx, FHx  :1}   Disposition:   Follow up with {EPPROVIDERS:28135} {EPFOLLOW UP:28173}  Signed, Graciella Freer, PA-C

## 2024-02-10 NOTE — Progress Notes (Signed)
 Remote pacemaker transmission.

## 2024-02-11 ENCOUNTER — Encounter: Payer: Self-pay | Admitting: Student

## 2024-02-11 ENCOUNTER — Encounter: Payer: Self-pay | Admitting: *Deleted

## 2024-02-11 ENCOUNTER — Ambulatory Visit: Payer: Medicare PPO | Attending: Student | Admitting: Student

## 2024-02-11 VITALS — BP 132/82 | HR 78 | Ht 59.0 in | Wt 157.0 lb

## 2024-02-11 DIAGNOSIS — I442 Atrioventricular block, complete: Secondary | ICD-10-CM

## 2024-02-11 DIAGNOSIS — Z79899 Other long term (current) drug therapy: Secondary | ICD-10-CM | POA: Diagnosis not present

## 2024-02-11 DIAGNOSIS — Z950 Presence of cardiac pacemaker: Secondary | ICD-10-CM

## 2024-02-11 DIAGNOSIS — I4891 Unspecified atrial fibrillation: Secondary | ICD-10-CM

## 2024-02-11 LAB — CUP PACEART INCLINIC DEVICE CHECK
Battery Remaining Longevity: 1 mo
Battery Voltage: 2.83 V
Brady Statistic AP VP Percent: 25.53 %
Brady Statistic AP VS Percent: 0 %
Brady Statistic AS VP Percent: 74.45 %
Brady Statistic AS VS Percent: 0.01 %
Brady Statistic RA Percent Paced: 25.48 %
Brady Statistic RV Percent Paced: 99.95 %
Date Time Interrogation Session: 20250401094136
Implantable Lead Connection Status: 753985
Implantable Lead Connection Status: 753985
Implantable Lead Implant Date: 20170906
Implantable Lead Implant Date: 20170906
Implantable Lead Location: 753859
Implantable Lead Location: 753860
Implantable Lead Model: 3830
Implantable Lead Model: 5076
Implantable Pulse Generator Implant Date: 20170906
Lead Channel Impedance Value: 342 Ohm
Lead Channel Impedance Value: 399 Ohm
Lead Channel Impedance Value: 456 Ohm
Lead Channel Impedance Value: 494 Ohm
Lead Channel Pacing Threshold Amplitude: 0.5 V
Lead Channel Pacing Threshold Amplitude: 1.75 V
Lead Channel Pacing Threshold Pulse Width: 0.4 ms
Lead Channel Pacing Threshold Pulse Width: 0.4 ms
Lead Channel Sensing Intrinsic Amplitude: 1.5 mV
Lead Channel Sensing Intrinsic Amplitude: 2.125 mV
Lead Channel Sensing Intrinsic Amplitude: 4 mV
Lead Channel Sensing Intrinsic Amplitude: 4 mV
Lead Channel Setting Pacing Amplitude: 2 V
Lead Channel Setting Pacing Amplitude: 2.5 V
Lead Channel Setting Pacing Pulse Width: 1 ms
Lead Channel Setting Sensing Sensitivity: 0.9 mV
Zone Setting Status: 755011
Zone Setting Status: 755011

## 2024-02-11 NOTE — Patient Instructions (Addendum)
 Medication Instructions:  Your physician recommends that you continue on your current medications as directed. Please refer to the Current Medication list given to you today.  *If you need a refill on your cardiac medications before your next appointment, please call your pharmacy*  Lab Work: [facility], [facility] You will need to go to any [facility] for these labs. There is a [facility] in our building on the 1st floor or you can call [phone] or visit [email or web address] to find a lab near you. - You do NOT need an appointment for this. You do NOT need to be fasting. We NO LONGER have a lab located in our office.  If you have labs (blood work) drawn today and your tests are completely normal, you will receive your results only by: MyChart Message (if you have MyChart) OR A paper copy in the mail If you have any lab test that is abnormal or we need to change your treatment, we will call you to review the results.  Testing/Procedures: See letter  Follow-Up: At [facility], you and your health needs are our priority.  As part of our continuing mission to provide you with exceptional heart care, our providers are all part of one team.  This team includes your primary Cardiologist (physician) and Advanced Practice Providers or APPs (Physician Assistants and Nurse Practitioners) who all work together to provide you with the care you need, when you need it.  Your next appointment:   Follow up appointments will be arranged for you and print out on your discharge summary after your procedure.  We recommend signing up for the patient portal called "MyChart".  Sign up information is provided on this After Visit Summary.  MyChart is used to connect with patients for Virtual Visits (Telemedicine).  Patients are able to view lab/test results, encounter notes, upcoming appointments, etc.  Non-urgent messages can be sent to your provider as well.   To learn more about what you can do with MyChart, go  to [email or web address].      1st Floor: - Lobby - Registration  - Pharmacy  - Lab - Cafe  2nd Floor: - PV Lab - Diagnostic Testing (echo, CT, nuclear med)  3rd Floor: - Vacant  4th Floor: - TCTS (cardiothoracic surgery) - AFib Clinic - Structural Heart Clinic - Vascular Surgery  - Vascular Ultrasound  5th Floor: - HeartCare Cardiology (general and EP) - Clinical Pharmacy for coumadin, hypertension, lipid, weight-loss medications, and med management appointments    Valet parking services will be available as well.

## 2024-03-05 ENCOUNTER — Telehealth (HOSPITAL_COMMUNITY): Payer: Self-pay

## 2024-03-05 NOTE — Telephone Encounter (Signed)
 Attempted to reach patient to discuss upcoming procedure, no answer. Left VM for patient to return call.

## 2024-03-06 NOTE — Telephone Encounter (Signed)
 Spoke with patient to complete pre-procedure call.     New medical conditions? No Recent hospitalizations or surgeries? No Started any new medications? No Patient made aware to contact office to inform of any new medications started. Any changes in activities of daily living? No  Pre-procedure testing scheduled: lab work ordered  Confirmed patient is scheduled for PPM generator change on Tuesday, May 13 with Dr. Richardo Chandler. Instructed patient to arrive at the Main Entrance A at [facility]: [address] [place], [place] [address] and check in at Admitting at 7:30 AM.  Advised of plan to go home the same day and will only stay overnight if medically necessary. You MUST have a responsible adult to drive you home and MUST be with you the first 24 hours after you arrive home or your procedure could be cancelled.  Patient verbalized understanding to information provided and is agreeable to proceed with procedure.

## 2024-03-10 ENCOUNTER — Telehealth: Payer: Self-pay

## 2024-03-10 NOTE — Telephone Encounter (Signed)
 Spoke with pt and advised pt's pacemaker generator change date will need to be moved to Apr 07, 2024 with Dr Marven Slimmer due to Dr Rodolfo Clan being out on leave.  She will keep her labs as currently scheduled. Letter will be updated and mailed to pt.   Pt verbalizes understanding and agrees with current plan.

## 2024-03-18 NOTE — Addendum Note (Signed)
 Addended by: Lott Rouleau A on: 03/18/2024 09:23 AM   Modules accepted: Orders, Level of Service

## 2024-03-18 NOTE — Progress Notes (Signed)
 Remote pacemaker transmission.

## 2024-03-19 DIAGNOSIS — Z950 Presence of cardiac pacemaker: Secondary | ICD-10-CM | POA: Diagnosis not present

## 2024-03-19 DIAGNOSIS — I442 Atrioventricular block, complete: Secondary | ICD-10-CM | POA: Diagnosis not present

## 2024-03-19 DIAGNOSIS — I4891 Unspecified atrial fibrillation: Secondary | ICD-10-CM | POA: Diagnosis not present

## 2024-03-19 DIAGNOSIS — Z79899 Other long term (current) drug therapy: Secondary | ICD-10-CM | POA: Diagnosis not present

## 2024-03-19 LAB — CBC

## 2024-03-20 ENCOUNTER — Ambulatory Visit (INDEPENDENT_AMBULATORY_CARE_PROVIDER_SITE_OTHER)

## 2024-03-20 DIAGNOSIS — I442 Atrioventricular block, complete: Secondary | ICD-10-CM

## 2024-03-20 LAB — CBC
Hematocrit: 42 % (ref 34.0–46.6)
Hemoglobin: 13.3 g/dL (ref 11.1–15.9)
MCH: 29.1 pg (ref 26.6–33.0)
MCHC: 31.7 g/dL (ref 31.5–35.7)
MCV: 92 fL (ref 79–97)
Platelets: 294 10*3/uL (ref 150–450)
RBC: 4.57 x10E6/uL (ref 3.77–5.28)
RDW: 13.1 % (ref 11.7–15.4)
WBC: 6.6 10*3/uL (ref 3.4–10.8)

## 2024-03-20 LAB — BASIC METABOLIC PANEL WITH GFR
BUN/Creatinine Ratio: 15 (ref 12–28)
BUN: 22 mg/dL (ref 8–27)
CO2: 23 mmol/L (ref 20–29)
Calcium: 9.8 mg/dL (ref 8.7–10.3)
Chloride: 98 mmol/L (ref 96–106)
Creatinine, Ser: 1.45 mg/dL — ABNORMAL HIGH (ref 0.57–1.00)
Glucose: 89 mg/dL (ref 70–99)
Potassium: 3.9 mmol/L (ref 3.5–5.2)
Sodium: 140 mmol/L (ref 134–144)
eGFR: 35 mL/min/{1.73_m2} — ABNORMAL LOW (ref >59)

## 2024-03-23 LAB — CUP PACEART REMOTE DEVICE CHECK
Battery Remaining Longevity: 1 mo
Battery Voltage: 2.82 V
Brady Statistic AP VP Percent: 24.11 %
Brady Statistic AP VS Percent: 0 %
Brady Statistic AS VP Percent: 75.88 %
Brady Statistic AS VS Percent: 0.01 %
Brady Statistic RA Percent Paced: 23.99 %
Brady Statistic RV Percent Paced: 99.95 %
Date Time Interrogation Session: 20250509110406
Implantable Lead Connection Status: 753985
Implantable Lead Connection Status: 753985
Implantable Lead Implant Date: 20170906
Implantable Lead Implant Date: 20170906
Implantable Lead Location: 753859
Implantable Lead Location: 753860
Implantable Lead Model: 3830
Implantable Lead Model: 5076
Implantable Pulse Generator Implant Date: 20170906
Lead Channel Impedance Value: 342 Ohm
Lead Channel Impedance Value: 437 Ohm
Lead Channel Impedance Value: 437 Ohm
Lead Channel Impedance Value: 513 Ohm
Lead Channel Pacing Threshold Amplitude: 0.5 V
Lead Channel Pacing Threshold Amplitude: 1.875 V
Lead Channel Pacing Threshold Pulse Width: 0.4 ms
Lead Channel Pacing Threshold Pulse Width: 0.4 ms
Lead Channel Sensing Intrinsic Amplitude: 1.875 mV
Lead Channel Sensing Intrinsic Amplitude: 1.875 mV
Lead Channel Sensing Intrinsic Amplitude: 4.5 mV
Lead Channel Sensing Intrinsic Amplitude: 4.5 mV
Lead Channel Setting Pacing Amplitude: 2 V
Lead Channel Setting Pacing Amplitude: 2.5 V
Lead Channel Setting Pacing Pulse Width: 1 ms
Lead Channel Setting Sensing Sensitivity: 0.9 mV
Zone Setting Status: 755011
Zone Setting Status: 755011

## 2024-03-24 DIAGNOSIS — Z950 Presence of cardiac pacemaker: Secondary | ICD-10-CM

## 2024-03-24 DIAGNOSIS — I442 Atrioventricular block, complete: Secondary | ICD-10-CM

## 2024-03-27 ENCOUNTER — Other Ambulatory Visit: Payer: Self-pay | Admitting: Family Medicine

## 2024-03-27 MED ORDER — FAMOTIDINE 40 MG PO TABS: ORAL_TABLET | ORAL | 3 refills | Status: AC

## 2024-03-27 NOTE — Telephone Encounter (Signed)
 Copied from CRM [id number]. Topic: Clinical - Medication Refill >> Mar 27, 2024 10:15 AM Janise Melia C wrote: Medication: famotidine    Has the patient contacted their pharmacy? Yes They stated that Dr. Arlene Ben opted out for refilling the medication. She explained that Dr. Arlene Ben had agreed to take over all her medications.  This is the patient's preferred pharmacy:  [id number] [place], [place] - [address] [place] [address] Phone: [phone] Fax: [phone]  Is this the correct pharmacy for this prescription? Yes If no, delete pharmacy and type the correct one.   Has the prescription been filled recently? No  Is the patient out of the medication? Yes  Has the patient been seen for an appointment in the last year OR does the patient have an upcoming appointment? No  Can we respond through MyChart? No  Agent: Please be advised that Rx refills may take up to 3 business days. We ask that you follow-up with your pharmacy.

## 2024-03-31 ENCOUNTER — Telehealth (HOSPITAL_COMMUNITY): Payer: Self-pay

## 2024-03-31 NOTE — Telephone Encounter (Signed)
 Spoke with patient to discuss upcoming procedure.   Confirmed patient is scheduled for a PPM generator change on Tuesday, May 27 with Dr. Harvie Liner. Instructed patient to arrive at the Main Entrance A at [facility]: [address] [place], [place] [address] and check in at Admitting at 1:00 PM.   Labs completed  Any recent signs of acute illness or been started on antibiotics? No Any new medications started? No Any medications to hold?  Medication instructions:  On the morning of your procedure, you may take your morning medications except do not take Lasix  (Furosemide ).  No eating or drinking after midnight prior to procedure.   The night before your procedure and the morning of your procedure, wash thoroughly with the CHG surgical soap from the neck down, paying special attention to the area where your procedure will be performed.  Advised of plan to go home the same day and will only stay overnight if medically necessary. You MUST have a responsible adult to drive you home and MUST be with you the first 24 hours after you arrive home.  Patient verbalized understanding to all instructions provided and agreed to proceed with procedure.

## 2024-04-03 NOTE — Pre-Procedure Instructions (Signed)
Instructed patient on the following items: Arrival time 1:00 Nothing to eat or drink after midnight No meds AM of procedure Responsible person to drive you home and stay with you for 24 hrs Wash with special soap night before and morning of procedure

## 2024-04-07 ENCOUNTER — Other Ambulatory Visit: Payer: Self-pay

## 2024-04-07 ENCOUNTER — Ambulatory Visit (HOSPITAL_COMMUNITY)
Admission: RE | Admit: 2024-04-07 | Discharge: 2024-04-07 | Disposition: A | Discharge status: Home / Self Care | Attending: Cardiology | Admitting: Cardiology

## 2024-04-07 ENCOUNTER — Encounter (HOSPITAL_COMMUNITY)
Admission: RE | Disposition: A | Discharge status: Home / Self Care | Payer: Self-pay | Source: Home / Self Care | Attending: Cardiology

## 2024-04-07 DIAGNOSIS — I10 Essential (primary) hypertension: Secondary | ICD-10-CM | POA: Insufficient documentation

## 2024-04-07 DIAGNOSIS — I442 Atrioventricular block, complete: Secondary | ICD-10-CM | POA: Insufficient documentation

## 2024-04-07 DIAGNOSIS — Z950 Presence of cardiac pacemaker: Secondary | ICD-10-CM

## 2024-04-07 DIAGNOSIS — Z45010 Encounter for checking and testing of cardiac pacemaker pulse generator [battery]: Secondary | ICD-10-CM | POA: Insufficient documentation

## 2024-04-07 HISTORY — PX: PPM GENERATOR CHANGEOUT: EP1233

## 2024-04-07 SURGERY — PPM GENERATOR CHANGEOUT

## 2024-04-07 MED ORDER — LIDOCAINE HCL (PF) 1 % IJ SOLN
INTRAMUSCULAR | Status: AC
Start: 2024-04-07 — End: ?
  Filled 2024-04-07: qty 60

## 2024-04-07 MED ORDER — SODIUM CHLORIDE 0.9 % IV SOLN: 80.0000 mg | INTRAVENOUS | Status: AC

## 2024-04-07 MED ORDER — SODIUM CHLORIDE 0.9 % IV SOLN
INTRAVENOUS | Status: AC
  Administered 2024-04-07: 80 mg
  Filled 2024-04-07: qty 2

## 2024-04-07 MED ORDER — CEFAZOLIN SODIUM-DEXTROSE 2-4 GM/100ML-% IV SOLN: 2.0000 g | INTRAVENOUS | Status: AC

## 2024-04-07 MED ORDER — ONDANSETRON HCL 4 MG/2ML IJ SOLN: 4.0000 mg | Freq: Four times a day (QID) | INTRAMUSCULAR | Status: DC | PRN

## 2024-04-07 MED ORDER — ACETAMINOPHEN 325 MG PO TABS: 325.0000 mg | ORAL_TABLET | ORAL | Status: DC | PRN

## 2024-04-07 MED ORDER — CEFAZOLIN SODIUM-DEXTROSE 2-4 GM/100ML-% IV SOLN
INTRAVENOUS | Status: AC
  Administered 2024-04-07: 2 g via INTRAVENOUS
  Filled 2024-04-07: qty 100

## 2024-04-07 MED ORDER — LIDOCAINE HCL (PF) 1 % IJ SOLN
INTRAMUSCULAR | Status: DC | PRN
  Administered 2024-04-07: 50 mL

## 2024-04-07 MED ORDER — SODIUM CHLORIDE 0.9 % IV SOLN: INTRAVENOUS | Status: DC

## 2024-04-07 SURGICAL SUPPLY — 7 items
CABLE SURGICAL S-101-97-12 (CABLE) ×1 IMPLANT
IPG PACE AZUR XT DR MRI W1DR01 (Pacemaker) IMPLANT
KIT WRENCH (KITS) IMPLANT
PAD DEFIB RADIO PHYSIO CONN (PAD) ×1 IMPLANT
POUCH AIGIS-R ANTIBACT PPM (Mesh General) ×1 IMPLANT
POUCH AIGIS-R ANTIBACT PPM MED (Mesh General) IMPLANT
TRAY PACEMAKER INSERTION (PACKS) ×1 IMPLANT

## 2024-04-07 NOTE — Discharge Instructions (Addendum)
 Implantable Cardiac Device Battery Change, Care After  What can I expect after the procedure? After your procedure, it is common to have: Pain or soreness at the site where the cardiac device was inserted. Swelling at the site where the cardiac device was inserted. You should received an information card for your new device in 4-8 weeks. Follow these instructions at home: Incision care  Keep the incision clean and dry. Do not take baths, swim, or use a hot tub until after your wound check.  Do not shower for at least 7 days, or as directed by your health care provider. Pat the area dry with a clean towel. Do not rub the area. This may cause bleeding. Follow instructions from your health care provider about how to take care of your incision. Make sure you: If your incision is sealed with Steri-strips or staples, you may shower 7 days after your procedure or when told by your provider. Do not remove the steri-strips or let the shower hit directly on your site. You may wash around your site with soap and water.  Check your incision area every day for signs of infection. Check for: More redness, swelling, or pain. More fluid or blood. Warmth. Pus or a bad smell. Activity Do not lift anything that is heavier than 10 lb (4.5 kg) for 1 week. For the first week, or as long as told by your health care provider: Avoid lifting your affected arm higher than your shoulder. After 1 week, Be gentle when you move your arms over your head. It is okay to raise your arm to comb your hair. Avoid strenuous exercise. Ask your health care provider when it is okay to: Resume your normal activities. Return to work or school. Resume sexual activity. Eating and drinking Eat a heart-healthy diet. This should include plenty of fresh fruits and vegetables, whole grains, low-fat dairy products, and lean protein like chicken and fish. Limit alcohol intake to no more than 1 drink a day for non-pregnant women and 2  drinks a day for men. One drink equals 12 oz of beer, 5 oz of wine, or 1 oz of hard liquor. Check ingredients and nutrition facts on packaged foods and beverages. Avoid the following types of food: Food that is high in salt (sodium). Food that is high in saturated fat, like full-fat dairy or red meat. Food that is high in trans fat, like fried food. Food and drinks that are high in sugar. Lifestyle Do not use any products that contain nicotine or tobacco, such as cigarettes and e-cigarettes. If you need help quitting, ask your health care provider. Take steps to manage and control your weight. Once cleared, get regular exercise. Aim for 150 minutes of moderate-intensity exercise (such as walking or yoga) or 75 minutes of vigorous exercise (such as running or swimming) each week. Manage other health problems, such as diabetes or high blood pressure. Ask your health care provider how you can manage these conditions. General instructions Do not drive for 24 hours after your procedure if you were given a medicine to help you relax (sedative). Take over-the-counter and prescription medicines only as told by your health care provider. Avoid putting pressure on the area where the cardiac device was placed. If you need an MRI after your cardiac device has been placed, be sure to tell the health care provider who orders the MRI that you have a cardiac device. Keep all follow-up visits as directed by your health care provider. This is important.  Contact a health care provider if: You have pain at the incision site that is not relieved by over-the-counter or prescription medicines. You have any of these around your incision site or coming from it: More redness, swelling, or pain. Fluid or blood. Warmth to the touch. Pus or a bad smell. You have a fever. You feel brief, occasional palpitations, light-headedness, or any symptoms that you think might be related to your heart. Get help right away  if: You experience chest pain that is different from the pain at the cardiac device site. You develop a red streak that extends above or below the incision site. You experience shortness of breath. You have palpitations or an irregular heartbeat. You have light-headedness that does not go away quickly. You faint or have dizzy spells. Your pulse suddenly drops or increases rapidly and does not return to normal. You begin to gain weight and your legs and ankles swell. Summary After your procedure, it is common to have pain, soreness, and some swelling where the cardiac device was inserted. Make sure to keep your incision clean and dry. Follow instructions from your health care provider about how to take care of your incision. Check your incision every day for signs of infection, such as more pain or swelling, pus or a bad smell, warmth, or leaking fluid and blood. Avoid strenuous exercise and lifting your left arm higher than your shoulder for 2 weeks, or as long as told by your health care provider. This information is not intended to replace advice given to you by your health care provider. Make sure you discuss any questions you have with your health care provider.

## 2024-04-07 NOTE — H&P (Signed)
   H&P   Patient ID: Kylie Patrick MRN: [id number]; DOB: Jun 28, 1936  Admit date: 04/07/2024 Date of Consult: 04/07/2024  PCP:  Almira Jaeger, MD   History of Present Illness:   Kylie Patrick is a 88 y.o. female with h/o CHB s/p PPM, h/o syncope, HTN, and subclinical AF who presents for generator change. She has been feeling well. Not on OAC.    Past medical, surgical, social and family history reviewed.  ROS:  Please see the history of present illness.  All other ROS reviewed and negative.     Physical Exam/Data:   Vitals:   04/07/24 1309  BP: (!) 121/58  Pulse: 68  Resp: 18  Temp: 97.9 F (36.6 C)  TempSrc: Oral  SpO2: 98%    General:  Well nourished, well developed, in no acute distress Cardiac:  CIED pocket well healed Lungs:  clear to auscultation bilaterally, no wheezing, rhonchi or rales  Psych:  Normal affect    Assessment and Plan:  #CHB #PPM in situ Presents for generator change today due to the device being at [facility] [address]. Procedure reviewed and she wishes to proceed.  Risks, benefits, and alternatives to pulse generator replacement were discussed in detail today.  The patient understands that risks include but are not limited to bleeding, infection, pneumothorax, perforation, tamponade, vascular damage, renal failure, MI, stroke, death, inappropriate shocks, damage to his existing leads, and lead dislodgement and wishes to proceed.  We will therefore schedule the procedure at the next available time.   Donelda Fujita T. Marven Slimmer, MD, [facility], [facility] Cardiac Electrophysiology

## 2024-04-08 ENCOUNTER — Encounter (HOSPITAL_COMMUNITY): Payer: Self-pay | Admitting: Cardiology

## 2024-04-09 ENCOUNTER — Encounter: Payer: Self-pay | Admitting: Emergency Medicine

## 2024-04-21 ENCOUNTER — Ambulatory Visit: Attending: Cardiology

## 2024-04-21 DIAGNOSIS — I442 Atrioventricular block, complete: Secondary | ICD-10-CM

## 2024-04-21 NOTE — Progress Notes (Signed)
 Normal dual chamber pacemaker wound check. Presenting rhythm: AS/VP 30. Wound well healed. Routine testing performed. Thresholds, sensing, and impedances consistent. No episodes. Pt enrolled in remote follow-up.

## 2024-04-21 NOTE — Patient Instructions (Signed)

## 2024-04-22 ENCOUNTER — Encounter: Payer: Self-pay | Admitting: Family Medicine

## 2024-04-22 ENCOUNTER — Ambulatory Visit (INDEPENDENT_AMBULATORY_CARE_PROVIDER_SITE_OTHER): Payer: Medicare PPO | Admitting: Family Medicine

## 2024-04-22 VITALS — BP 102/64 | HR 64 | Temp 97.9°F | Ht 59.0 in | Wt 156.0 lb

## 2024-04-22 DIAGNOSIS — Z131 Encounter for screening for diabetes mellitus: Secondary | ICD-10-CM | POA: Diagnosis not present

## 2024-04-22 DIAGNOSIS — R739 Hyperglycemia, unspecified: Secondary | ICD-10-CM

## 2024-04-22 DIAGNOSIS — M810 Age-related osteoporosis without current pathological fracture: Secondary | ICD-10-CM

## 2024-04-22 DIAGNOSIS — I4891 Unspecified atrial fibrillation: Secondary | ICD-10-CM

## 2024-04-22 DIAGNOSIS — N183 Chronic kidney disease, stage 3 unspecified: Secondary | ICD-10-CM

## 2024-04-22 DIAGNOSIS — E782 Mixed hyperlipidemia: Secondary | ICD-10-CM

## 2024-04-22 DIAGNOSIS — I700 Atherosclerosis of aorta: Secondary | ICD-10-CM | POA: Diagnosis not present

## 2024-04-22 DIAGNOSIS — Z0000 Encounter for general adult medical examination without abnormal findings: Secondary | ICD-10-CM | POA: Diagnosis not present

## 2024-04-22 DIAGNOSIS — I442 Atrioventricular block, complete: Secondary | ICD-10-CM

## 2024-04-22 DIAGNOSIS — I10 Essential (primary) hypertension: Secondary | ICD-10-CM

## 2024-04-22 NOTE — Patient Instructions (Addendum)
 Please stop by lab before you go If you have mychart- we will send your results within 3 business days of us  receiving them.  If you do not have mychart- we will call you about results within 5 business days of us  receiving them.  *please also note that you will see labs on mychart as soon as they post. I will later go in and write notes on them- will say notes from Dr. Arlene Ben    if this foot continues to worsen she needs to let me know  she is going to try to cut the losartan  to 12.5 mg   Recommended follow up: Return in 6 months (on 10/22/2024) for followup or sooner if needed.Schedule b4 you leave.

## 2024-04-22 NOTE — Progress Notes (Addendum)
 Phone [phone]   Subjective:  Patient presents today for their annual physical. Chief complaint-noted.   See problem oriented charting- ROS- full  review of systems was completed and negative Per full ROS sheet completed by patient except for topics noted under acute/chronic concerns  The following were reviewed and entered/updated in epic: Past Medical History:  Diagnosis Date   Anxiety    Colon polyp 2003   Complication of anesthesia    slow to wake up one time   Gastroenteritis    w/ renal insufficiency in context of protracted nausea & vomitting 2006   GERD (gastroesophageal reflux disease)    H/O hiatal hernia    Headache(784.0)    occasional lifelong- gabapentin  helps a   Hiatal hernia    Hyperlipidemia    Hypertension    w/ LVH on ECHO   Pacemaker 07/2016   Tonsillitis 2006   Patient Active Problem List   Diagnosis Date Noted   Atrial fibrillation (HCC) -SCAF 08/14/2022    Priority: High   Pacemaker - MDT 08/11/2020    Priority: High   Complete heart block (HCC) 04/05/2020    Priority: High   Neuropathy 10/09/2023    Priority: Medium    Aortic atherosclerosis (HCC) 04/26/2020    Priority: Medium    Osteoporosis 04/25/2020    Priority: Medium    Carpal tunnel syndrome, right 05/02/2019    Priority: Medium    Diastolic dysfunction 07/12/2016    Priority: Medium    Hyperglycemia 10/09/2014    Priority: Medium    GERD (gastroesophageal reflux disease) 10/22/2013    Priority: Medium    CKD (chronic kidney disease) stage 3, GFR 30-59 ml/min (HCC) 05/05/2008    Priority: Medium    Anxiety state 04/20/2008    Priority: Medium    HYPERLIPIDEMIA 08/12/2007    Priority: Medium    Essential hypertension 08/12/2007    Priority: Medium    Rash 07/04/2017    Priority: Low   Hiatal hernia 08/03/2013    Priority: Low   History of colonic polyps 01/09/2013    Priority: Low   NIGHT SWEATS 02/19/2008    Priority: Low   Exercise intolerance 08/14/2022    Past Surgical History:  Procedure Laterality Date   ABDOMINAL HYSTERECTOMY  1976   BSO for endometriosis and bengin tumor   APPENDECTOMY  1970   BREAST LUMPECTOMY Left 70's   X 2   BUNIONECTOMY Bilateral    CATARACT EXTRACTION W/PHACO Left 05/14/2013   Procedure: CATARACT EXTRACTION PHACO AND INTRAOCULAR LENS PLACEMENT (IOC);  Surgeon: Anner Kill, MD;  Location: [facility];  Service: Ophthalmology;  Laterality: Left;  CDE:9.Kylie   CATARACT EXTRACTION W/PHACO Right 06/08/2013   Procedure: CATARACT EXTRACTION PHACO AND INTRAOCULAR LENS PLACEMENT (IOC);  Surgeon: Anner Kill, MD;  Location: [facility];  Service: Ophthalmology;  Laterality: Right;  CDE 13.65   CHOLECYSTECTOMY  1970   COLONOSCOPY W/ POLYPECTOMY  2003   Dr Willy Harvest   EP IMPLANTABLE DEVICE N/A 07/18/2016   Procedure: Pacemaker Implant;  Surgeon: Verona Goodwill, MD;  Location: [facility] INVASIVE CV LAB;  Service: Cardiovascular;  Laterality: N/A;   ESOPHAGEAL MANOMETRY N/A 10/05/2013   Procedure: ESOPHAGEAL MANOMETRY (EM);  Surgeon: Kenney Peacemaker, MD;  Location: [facility];  Service: Endoscopy;  Laterality: N/A;   ESOPHAGOGASTRODUODENOSCOPY  02/05/13   Large Hiatal Hernia   HERNIA REPAIR     LAPAROSCOPIC NISSEN FUNDOPLICATION Bilateral 10/22/2013   Procedure: LAPAROSCOPIC NISSEN FUNDOPLICATION with hiatal hernia repair ;  Surgeon: Fermin Hove  Hoxworth, MD;  Location: [facility];  Service: General;  Laterality: Bilateral;   PPM GENERATOR CHANGEOUT N/A 04/07/2024   Procedure: PPM GENERATOR CHANGEOUT;  Surgeon: Boyce Byes, MD;  Location: [facility] INVASIVE CV LAB;  Service: Cardiovascular;  Laterality: N/A;    Family History  Problem Relation Age of Onset   Heart attack Father 19       fatal MI @ 54   Coronary artery disease Mother        CABG; MI @ 53   Diabetes Mother    Stroke Paternal Grandfather        > 41   Stroke Maternal Grandmother        in 48s   Aneurysm Paternal Grandmother        cns ; in 15s   Diabetes Maternal Aunt    Stomach  cancer Maternal Aunt    Breast cancer Maternal Aunt    Heart disease Sister    Coronary artery disease Maternal Grandfather    Colon cancer Neg Hx    Esophageal cancer Neg Hx     Medications- reviewed and updated Current Outpatient Medications  Medication Sig Dispense Refill   Ascorbic Acid (VITAMIN C PO) Take 1 tablet by mouth daily.     Calcium in Bone Mineral Cmplx 350 MG MISC Take 1 each by mouth in the morning, at noon, and at bedtime.     carvedilol  (COREG ) 6.25 MG tablet Take 1.5 tablets (9.375 mg total) by mouth 2 (two) times daily with a meal. 270 tablet 3   Cholecalciferol (VITAMIN D3) 2000 UNITS TABS Take 1 tablet by mouth daily with lunch.     Cyanocobalamin  (VITAMIN B-12 PO) Take 1 capsule by mouth daily at 2 am.     esomeprazole  (NEXIUM ) 40 MG capsule TAKE 1 CAPSULE BY MOUTH TWICE A DAY BEFORE A MEAL (Patient taking differently: Take 40 mg by mouth 2 (two) times daily as needed (acid reflux).) 180 capsule 3   ezetimibe -simvastatin  (VYTORIN ) 10-20 MG tablet TAKE 1 TABLET BY MOUTH EVERYDAY AT BEDTIME 90 tablet 2   famotidine  (PEPCID ) 40 MG tablet TAKE 1 TABLET BY MOUTH EVERYDAY AT BEDTIME 90 tablet 3   furosemide  (LASIX ) 40 MG tablet TAKE 1 TABLET BY MOUTH EVERY DAY 90 tablet 3   gabapentin  (NEURONTIN ) 100 MG capsule Take 2 capsules (200 mg total) by mouth at bedtime. (Patient taking differently: Take 100-200 mg by mouth at bedtime.) 180 capsule 3   KLOR-CON  M20 20 MEQ tablet TAKE 1 TABLET (20 MEQ TOTAL) BY MOUTH DAILY AS NEEDED. (Patient taking differently: Take 20 mEq by mouth daily.) 90 tablet 1   losartan  (COZAAR ) 25 MG tablet TAKE 1 TABLET (25 MG TOTAL) BY MOUTH DAILY. 90 tablet 3   Magnesium  400 MG CAPS Take 400 mg by mouth at bedtime.     Omega-3 Fatty Acids (FISH OIL) 500 MG CAPS Take 500 mg by mouth daily with lunch.     ALPRAZolam  (XANAX ) 0.25 MG tablet Take 1 tablet (0.25 mg total) by mouth 2 (two) times daily as needed for anxiety (Do not drive for 8 hours after  taking.  If any falls on medicine stop immediately and let us  know). (Patient not taking: Reported on 04/22/2024) 30 tablet 0   No current facility-administered medications for this visit.    Allergies-reviewed and updated Allergies  Allergen Reactions   Chlorthalidone      01/09/13 creatinine 2.2   Hydrochlorothiazide     Hypokalemia   Losartan  Potassium-Hctz  Hypokalemia   Norvasc [Amlodipine Besylate]     Edema   Raloxifene     HTN   Rofecoxib     HTN    Social History   Social History Narrative   Lives alone, husband died July 21, 2024   Right Handed   Drinks caffeine rarely   Objective  Objective:  BP 102/64   Pulse 64   Temp 97.9 F (36.6 C)   Ht 4' 11 (1.499 m)   Wt 156 lb (70.8 kg)   SpO2 95%   BMI 31.51 kg/m  Gen: NAD, resting comfortably HEENT: Mucous membranes are moist. Oropharynx normal Neck: no thyromegaly CV: RRR no murmurs rubs or gallops Lungs: CTAB no crackles, wheeze, rhonchi Abdomen: soft/nontender/nondistended/normal bowel sounds. No rebound or guarding.  Ext: trace edema Skin: warm, dry, over left foot- faint erythema and mild warmth without tenderness to palpation- reports concern about recent bite Neuro: grossly normal, moves all extremities, PERRLA   Assessment and Plan   88 y.o. Patrick presenting for annual physical.  Health Maintenance counseling: 1. Anticipatory guidance: Patient counseled regarding regular dental exams -q6 months, eye exams - yearly,  avoiding smoking and second hand smoke , limiting alcohol to 1 beverage per day- very sparing glass of wine- about a year ago , no illicit drugs .   2. Risk factor reduction:  Advised patient of need for regular exercise and diet rich and fruits and vegetables to reduce risk of heart attack and stroke.  Exercise- trying to walk to mailbox at least each day- ideally would do weight bearing exercise- wonders if strained foot with less supportive shoes.  Diet/weight management-weight stable-  feels could improve food choices- if she buys is more careful but when son buys her food- sometimes does not monitor sugar content as well.  Wt Readings from Last 3 Encounters:  04/22/24 156 lb (70.8 kg)  02/11/24 157 lb (71.2 kg)  12/24/23 155 lb 11.2 oz (70.6 kg)  3. Immunizations/screenings/ancillary studies- has opted out of COVID shot.  Immunization History  Administered Date(s) Administered   Fluad Quad(high Dose 65+) 07/21/2019, 08/30/2020, 08/01/2022   Influenza Split 10/08/2011   Influenza Whole 09/06/2009, 10/02/2010   Influenza, High Dose Seasonal PF 09/10/2013, 10/10/2015, 10/10/2016, 10/24/2017, 08/18/2018, 07/28/2019, 08/15/2021, 07/27/2023   Influenza,inj,Quad PF,6+ Mos 10/04/2014   Influenza-Unspecified 08/18/2018   PFIZER(Purple Top)SARS-COV-2 Vaccination 12/17/2019, 01/07/2020, 08/18/2020   Pneumococcal Conjugate-13 10/25/2015   Pneumococcal Polysaccharide-23 10/10/2010   Tdap 10/10/2016   Zoster Recombinant(Shingrix) 07/21/2019, 09/13/2019, 10/16/2019   Zoster, Live 01/09/2013  4. Cervical cancer screening- past age based screening  recommendations  5. Breast cancer screening-  past age based screening recommendations 6. Colon cancer screening - past age based screening recommendations 7. Skin cancer screening- not recently. advised regular sunscreen use. Denies worrisome, changing, or new skin lesions.  8. Birth control/STD check- not active/not dating 52. Osteoporosis screening at 65- see below 10. Smoking associated screening - never smoker  Status of chronic or acute concerns   #redness/warmth/pain on top of left foot- started on Monday- wonders if had a bite- slightly more warm and tender today and red - has had some fluctuations though- if this continues to worsen she needs to let me know. Does not itch much  # Follicular lymphoma-followed by Dr. Armanda Bern removed and PET/CT reassuring-option of radiation but she is wanted to hold and recheck every 6  months- next visit should be around August with most recent in february  % complete heart block- Led to pacemaker placement -  follows with Dr. Rodolfo Clan yearly- now Dr. Alphonso Jean generator Apr 07, 2024 and doing well  #A fib- subclinical- cardiology monitoring- has not recommended anticoagulation including on most recent visit  #hypertension S: medication:  Carvedilol  9.375 mg BID , Lasix  40mg  daily, losartan  25mg  (reduced by Dr. Ansel Kingdom from 100mg  originally to 25 mg) BP Readings from Last 3 Encounters:  04/22/24 102/64  04/07/24 111/63  02/11/24 132/82  A/P:blood pressure well controlled continue current medications- honestly could try to reduce medications- she is going to try to cut the losartan  to 12.5 mg  - taking potassium now on lasix   #hyperlipidemia/aortic atherosclerosis-LDL goal at least under 100-ideally under 70 S: Medication:  fish oil , Vytorin  10-20mg  Lab Results  Component Value Date   CHOL 155 04/12/2023   HDL 50.60 04/12/2023   LDLCALC 81 04/12/2023   TRIG 116.0 04/12/2023   CHOLHDL 3 04/12/2023  A/P: close to idal goal even for aortic atherosclerosis - continue current medications and update today   # Anxiety S:Medication:  Xanax  once a month or so- only with very high stress  .-No falls or injuries reported  A/P: doing well- continue current medications    #Neuropathy- has seen Dr. Samara Crest starting 08/10/21- taking gabapentin  mainly as needed -Gabapentin  100 mg more frequently lately- helpful- continue current medications    # Hyperglycemia/insulin resistance/prediabetes- peak a1c June 2020 at 6.4 S:  Medication: none Lab Results  Component Value Date   HGBA1C 5.8 10/09/2023   HGBA1C 5.7 04/12/2023   HGBA1C 6.0 10/01/2022  A/P: hopefully stable- update a1c today. Continue without meds for now   #Osteoporosis- we have not had success getting her on Prolia. Renal functin was below what we could use reclast for. November 2023 bone density stable to  slightly improve-D wants to recheck next visit. Continue calcium and vitamin D - ill check vitamin D  today  % Chronic kidney disease Stage III-follows with Dr. Ansel Kingdom S: knows to avoid nsaids.  A/P: no recent visits- GFR stable in 30s- she may schedule follow up    % GERD-released from Dr. Willy Harvest in January 2024 -s/p hiatal hernia surgery- bad experience S: Nexium  40mg  BID long-term plan with H2 blocker before bed.   A/P: reasonable control for most part- continue to monitor - still occasionally with breakthrough    Recommended follow up: Return in 6 months (on 10/22/2024) for followup or sooner if needed.Schedule b4 you leave. Future Appointments  Date Time Provider Department Center  06/22/2024  9:00 AM [facility] None  06/22/2024  9:30 AM Frankie Israel, MD [facility] None  07/09/2024 11:20 AM Debbie Fails, PA-C [facility]V  07/13/2024  7:00 AM [facility] DEVICE REMOTES [facility]  10/01/2024 10:40 AM [facility] ANNUAL WELLNESS VISIT 1 [facility] PEC  10/12/2024  7:10 AM [facility] DEVICE REMOTES [facility]  01/11/2025  7:00 AM [facility] DEVICE REMOTES [facility]  04/12/2025  7:00 AM [facility] DEVICE REMOTES [facility]  07/12/2025  7:00 AM [facility] DEVICE REMOTES [facility]  10/11/2025  7:00 AM [facility] DEVICE REMOTES [facility]  01/10/2026  7:00 AM [facility] DEVICE REMOTES [facility]  04/12/2026  7:00 AM [facility] DEVICE REMOTES [facility]   Lab/Order associations: fasting   ICD-10-CM   1. Routine general medical examination at a health care facility  Z00.00     2. Atrial fibrillation, unspecified type (HCC)  I48.91     3. Complete heart block (HCC)  I44.2     4.  Aortic atherosclerosis (HCC)  I70.0     5. Essential hypertension  I10 Comprehensive metabolic panel with GFR    CBC with Differential/Platelet    Lipid panel    6. Hyperglycemia  R73.9 Hemoglobin A1c    7. HYPERLIPIDEMIA  E78.2 Comprehensive metabolic panel with GFR    CBC with  Differential/Platelet    Lipid panel    8. Age-related osteoporosis without current pathological fracture  M81.0 VITAMIN D  25 Hydroxy (Vit-D Deficiency, Fractures)    9. Screening for diabetes mellitus  Z13.1 Hemoglobin A1c    10. Stage 3 chronic kidney disease, unspecified whether stage 3a or 3b CKD (HCC) Chronic N18.30       No orders of the defined types were placed in this encounter.   Return precautions advised.  Clarisa Crooked, MD

## 2024-04-23 ENCOUNTER — Ambulatory Visit: Payer: Self-pay | Admitting: Family Medicine

## 2024-04-24 NOTE — Addendum Note (Signed)
 Addended by: Lott Rouleau A on: 04/24/2024 11:25 AM   Modules accepted: Orders, Level of Service

## 2024-04-24 NOTE — Progress Notes (Signed)
 Remote pacemaker transmission.

## 2024-04-29 ENCOUNTER — Other Ambulatory Visit: Payer: Self-pay | Admitting: Family Medicine

## 2024-04-29 LAB — COMPREHENSIVE METABOLIC PANEL WITH GFR
AG Ratio: 1.5 (calc) (ref 1.0–2.5)
ALT: 10 U/L (ref 6–29)
AST: 16 U/L (ref 10–35)
Albumin: 4 g/dL (ref 3.6–5.1)
Alkaline phosphatase (APISO): 50 U/L (ref 37–153)
BUN/Creatinine Ratio: 12 (calc) (ref 6–22)
BUN: 15 mg/dL (ref 7–25)
CO2: 30 mmol/L (ref 20–32)
Calcium: 9.9 mg/dL (ref 8.6–10.4)
Chloride: 102 mmol/L (ref 98–110)
Creat: 1.3 mg/dL — ABNORMAL HIGH (ref 0.60–0.95)
Globulin: 2.6 g/dL (ref 1.9–3.7)
Glucose, Bld: 90 mg/dL (ref 65–99)
Potassium: 3.5 mmol/L (ref 3.5–5.3)
Sodium: 142 mmol/L (ref 135–146)
Total Bilirubin: 0.6 mg/dL (ref 0.2–1.2)
Total Protein: 6.6 g/dL (ref 6.1–8.1)
eGFR: 40 mL/min/{1.73_m2} — ABNORMAL LOW (ref >=60)

## 2024-04-29 LAB — LIPID PANEL
Cholesterol: 151 mg/dL (ref <200)
HDL: 56 mg/dL (ref >=50)
LDL Cholesterol (Calc): 76 mg/dL
Non-HDL Cholesterol (Calc): 95 mg/dL (ref <130)
Total CHOL/HDL Ratio: 2.7 (calc) (ref <5.0)
Triglycerides: 109 mg/dL (ref <150)

## 2024-04-29 LAB — CBC WITH DIFFERENTIAL/PLATELET
Absolute Lymphocytes: 1993 {cells}/uL (ref 850–3900)
Absolute Monocytes: 802 {cells}/uL (ref 200–950)
Basophils Absolute: 73 {cells}/uL (ref 0–200)
Basophils Relative: 0.9 %
Eosinophils Absolute: 259 {cells}/uL (ref 15–500)
Eosinophils Relative: 3.2 %
HCT: 38.6 % (ref 35.0–45.0)
Hemoglobin: 12.2 g/dL (ref 11.7–15.5)
MCH: 29 pg (ref 27.0–33.0)
MCHC: 31.6 g/dL — ABNORMAL LOW (ref 32.0–36.0)
MCV: 91.9 fL (ref 80.0–100.0)
MPV: 11 fL (ref 7.5–12.5)
Monocytes Relative: 9.9 %
Neutro Abs: 4973 {cells}/uL (ref 1500–7800)
Neutrophils Relative %: 61.4 %
Platelets: 255 10*3/uL (ref 140–400)
RBC: 4.2 10*6/uL (ref 3.80–5.10)
RDW: 13.1 % (ref 11.0–15.0)
Total Lymphocyte: 24.6 %
WBC: 8.1 10*3/uL (ref 3.8–10.8)

## 2024-04-29 LAB — VITAMIN D 25 HYDROXY (VIT D DEFICIENCY, FRACTURES): Vit D, 25-Hydroxy: 44 ng/mL (ref 30–100)

## 2024-04-29 LAB — HEMOGLOBIN A1C
Hgb A1c MFr Bld: 5.7 % — ABNORMAL HIGH (ref <5.7)
Mean Plasma Glucose: 117 mg/dL
eAG (mmol/L): 6.5 mmol/L

## 2024-04-29 LAB — TEST AUTHORIZATION

## 2024-05-29 ENCOUNTER — Telehealth: Payer: Self-pay

## 2024-05-29 NOTE — Telephone Encounter (Signed)
 Alert received from [employer]:  Alert remote transmission:  AT/AF Daily Burden > Threshold AF in progress from 7/17 @ 21:01, controlled rate, burden 0.2% Pt with hx of SCAF, no OAC per EPIC - route to triage high alert for ongoing arrhythmia

## 2024-05-29 NOTE — Telephone Encounter (Signed)
 Outreach made to Pt.  Requested Pt send a manual transmission d/t alert received for AF in progress.  Per Pt she did not feel well last night and went to  bed early, about 9:30 pm.    Transmission received and reviewed.  Pt currently in NSR.  AF episode lasted 1 hour 23 minutes.  Pt with history of SCAF.  No action needed.  Pt has scheduled follow up.

## 2024-06-09 ENCOUNTER — Other Ambulatory Visit: Payer: Self-pay | Admitting: Family Medicine

## 2024-06-19 ENCOUNTER — Other Ambulatory Visit: Payer: Self-pay

## 2024-06-19 DIAGNOSIS — C8291 Follicular lymphoma, unspecified, lymph nodes of head, face, and neck: Secondary | ICD-10-CM

## 2024-06-21 ENCOUNTER — Other Ambulatory Visit: Payer: Self-pay | Admitting: Family Medicine

## 2024-06-22 ENCOUNTER — Inpatient Hospital Stay: Payer: Medicare PPO | Admitting: Hematology

## 2024-06-22 ENCOUNTER — Inpatient Hospital Stay: Payer: Medicare PPO | Attending: Hematology

## 2024-06-22 VITALS — BP 145/61 | HR 83 | Temp 97.7°F | Resp 18 | Wt 152.7 lb

## 2024-06-22 DIAGNOSIS — D171 Benign lipomatous neoplasm of skin and subcutaneous tissue of trunk: Secondary | ICD-10-CM | POA: Insufficient documentation

## 2024-06-22 DIAGNOSIS — Z803 Family history of malignant neoplasm of breast: Secondary | ICD-10-CM | POA: Insufficient documentation

## 2024-06-22 DIAGNOSIS — Z8249 Family history of ischemic heart disease and other diseases of the circulatory system: Secondary | ICD-10-CM | POA: Diagnosis not present

## 2024-06-22 DIAGNOSIS — R5383 Other fatigue: Secondary | ICD-10-CM | POA: Insufficient documentation

## 2024-06-22 DIAGNOSIS — Z90710 Acquired absence of both cervix and uterus: Secondary | ICD-10-CM | POA: Insufficient documentation

## 2024-06-22 DIAGNOSIS — M419 Scoliosis, unspecified: Secondary | ICD-10-CM | POA: Insufficient documentation

## 2024-06-22 DIAGNOSIS — C8291 Follicular lymphoma, unspecified, lymph nodes of head, face, and neck: Secondary | ICD-10-CM | POA: Diagnosis not present

## 2024-06-22 DIAGNOSIS — Z9049 Acquired absence of other specified parts of digestive tract: Secondary | ICD-10-CM | POA: Diagnosis not present

## 2024-06-22 DIAGNOSIS — I129 Hypertensive chronic kidney disease with stage 1 through stage 4 chronic kidney disease, or unspecified chronic kidney disease: Secondary | ICD-10-CM | POA: Insufficient documentation

## 2024-06-22 DIAGNOSIS — Z79899 Other long term (current) drug therapy: Secondary | ICD-10-CM | POA: Insufficient documentation

## 2024-06-22 DIAGNOSIS — Z833 Family history of diabetes mellitus: Secondary | ICD-10-CM | POA: Insufficient documentation

## 2024-06-22 DIAGNOSIS — Z90722 Acquired absence of ovaries, bilateral: Secondary | ICD-10-CM | POA: Diagnosis not present

## 2024-06-22 DIAGNOSIS — Z860100 Personal history of colon polyps, unspecified: Secondary | ICD-10-CM | POA: Diagnosis not present

## 2024-06-22 DIAGNOSIS — K219 Gastro-esophageal reflux disease without esophagitis: Secondary | ICD-10-CM | POA: Insufficient documentation

## 2024-06-22 DIAGNOSIS — Z6830 Body mass index (BMI) 30.0-30.9, adult: Secondary | ICD-10-CM | POA: Insufficient documentation

## 2024-06-22 DIAGNOSIS — N183 Chronic kidney disease, stage 3 unspecified: Secondary | ICD-10-CM | POA: Insufficient documentation

## 2024-06-22 DIAGNOSIS — Z823 Family history of stroke: Secondary | ICD-10-CM | POA: Insufficient documentation

## 2024-06-22 DIAGNOSIS — G629 Polyneuropathy, unspecified: Secondary | ICD-10-CM | POA: Insufficient documentation

## 2024-06-22 DIAGNOSIS — E785 Hyperlipidemia, unspecified: Secondary | ICD-10-CM | POA: Diagnosis not present

## 2024-06-22 DIAGNOSIS — Z800 Family history of malignant neoplasm of digestive organs: Secondary | ICD-10-CM | POA: Diagnosis not present

## 2024-06-22 DIAGNOSIS — C8201 Follicular lymphoma grade I, lymph nodes of head, face, and neck: Secondary | ICD-10-CM | POA: Diagnosis not present

## 2024-06-22 DIAGNOSIS — R634 Abnormal weight loss: Secondary | ICD-10-CM | POA: Diagnosis not present

## 2024-06-22 DIAGNOSIS — K1379 Other lesions of oral mucosa: Secondary | ICD-10-CM

## 2024-06-22 LAB — CMP (CANCER CENTER ONLY)
ALT: 12 U/L (ref 0–44)
AST: 19 U/L (ref 15–41)
Albumin: 4.2 g/dL (ref 3.5–5.0)
Alkaline Phosphatase: 51 U/L (ref 38–126)
Anion gap: 7 (ref 5–15)
BUN: 22 mg/dL (ref 8–23)
CO2: 32 mmol/L (ref 22–32)
Calcium: 9.7 mg/dL (ref 8.9–10.3)
Chloride: 103 mmol/L (ref 98–111)
Creatinine: 1.58 mg/dL — ABNORMAL HIGH (ref 0.44–1.00)
GFR, Estimated: 32 mL/min — ABNORMAL LOW (ref >60)
Glucose, Bld: 92 mg/dL (ref 70–99)
Potassium: 3.5 mmol/L (ref 3.5–5.1)
Sodium: 142 mmol/L (ref 135–145)
Total Bilirubin: 0.7 mg/dL (ref 0.0–1.2)
Total Protein: 6.9 g/dL (ref 6.5–8.1)

## 2024-06-22 LAB — CBC WITH DIFFERENTIAL (CANCER CENTER ONLY)
Abs Immature Granulocytes: 0.01 K/uL (ref 0.00–0.07)
Basophils Absolute: 0.1 K/uL (ref 0.0–0.1)
Basophils Relative: 1 %
Eosinophils Absolute: 0.2 K/uL (ref 0.0–0.5)
Eosinophils Relative: 3 %
HCT: 40.2 % (ref 36.0–46.0)
Hemoglobin: 13 g/dL (ref 12.0–15.0)
Immature Granulocytes: 0 %
Lymphocytes Relative: 30 %
Lymphs Abs: 2 K/uL (ref 0.7–4.0)
MCH: 28.9 pg (ref 26.0–34.0)
MCHC: 32.3 g/dL (ref 30.0–36.0)
MCV: 89.3 fL (ref 80.0–100.0)
Monocytes Absolute: 0.7 K/uL (ref 0.1–1.0)
Monocytes Relative: 10 %
Neutro Abs: 3.8 K/uL (ref 1.7–7.7)
Neutrophils Relative %: 56 %
Platelet Count: 240 K/uL (ref 150–400)
RBC: 4.5 MIL/uL (ref 3.87–5.11)
RDW: 14.6 % (ref 11.5–15.5)
WBC Count: 6.7 K/uL (ref 4.0–10.5)
nRBC: 0 % (ref 0.0–0.2)

## 2024-06-22 LAB — LACTATE DEHYDROGENASE: LDH: 180 U/L (ref 98–192)

## 2024-06-22 NOTE — Progress Notes (Signed)
 HEMATOLOGY/ONCOLOGY CLINIC NOTE  Date of Service: 06/22/24   Patient Care Team: Katrinka Garnette KIDD, MD as PCP - General (Family Medicine) Fernande Elspeth BROCKS, MD as PCP - Electrophysiology (Cardiology)  CHIEF COMPLAINTS/PURPOSE OF CONSULTATION:  Evaluation and management of  low-grade follicular lymphoma   HISTORY OF PRESENTING ILLNESS:   Kylie Patrick is a wonderful 88 y.o. female who has been referred to us  by Katrinka Elia KIDD, MD for evaluation and management of low-grade follicular lymphoma.   She was seen by Katrinka Elia KIDD, MD on 06/04/2023 and reported a mouth mass at least 2 x 2 cm in front of left lower jaw.   Today, she is accompanied by her daughter-in-law and niece. She reports that she noticed the small nodule in her left cheek after resting her cheek in her palm. The nodule appeared relatively quickly and did not change over time. She has not had any significantly issues with the nodule. Patient also reports noticing a small nodule in her inner right cheek. She denies any other lumps/bumps in any other areas.   Patient denies any  major vision issues, chest pain, abdominal pain, change bowel habits, change in urination, new leg swelling, unexplained fever, chills, night sweats, weight loss, or significant new fatigue. Patient does sometimes endorse swallowing issues only at night.   She reports having neuropathy in her bilateral LEs. Patient denies any history of DM. Her symptoms began with nerve damage between her toes. Her nerve damage is stable at this time. However, her neuropathy has radiated to ankles. She manages her neuropathy with gabapentin .   Patient regularly takes her blood pressure medication and her BP is well controlled.   She reports a 20-pound weight loss attributed to stress over the last two years related to the loss of close family members including her son from lung cancer and her husband over the last couple of years. Patient reports having a lack of  appetite likely related to anxiety and stressful factors.   She denies any family history of lupus, RA, Sjogren's or other autoimmune disorders. Patient denies any issues with dry mouth or dry eyes. Patient denies any thyroid  issues or history of stroke.   Patient reports that she had mouth surgery several years ago after a fall causing teeth injury. Patient does have acid reflux which is managed by medication. Patient denies any new medications.   Patient does have a lipoma in her back. She notes mild scoliosis on previous imaging. Her sister and aunt have similar scoliosis issues.   She regularly takes vitamin B complex, fish oil, calcium, magnesium . She takes vitamin b12 regularly due to having low levels on recent labs. She previously took folic acid supplements, but has not recently due to running out.   She reports being very easily fatigued, which is a recent change over the last 3 months. Patient reports that she is more easily SOB recently, especially on exertion. She was seen by an EP on 07/12/2023 and reports that she requires continuous pacing and her pacemaker will have to be changed soon.   INTERVAL HISTORY:  Kylie Patrick is a 88 y.o. female here for continued evaluation and management of low-grade follicular lymphoma.   Patient was last seen by me on 12/24/2023.  Patient notes that since her last visit she feels that the nodule inside the left cheek has gradually grown in size. She notes no other acute new symptoms.  No fevers no chills no night sweats.  No other  new lumps or bumps. We discussed options of monitoring the nodule versus a more definitive approach by seeing the ENT surgeon to review biopsy/remove this nodule for evaluation/mx of possible recurrent follicular lymphoma.  MEDICAL HISTORY:  Past Medical History:  Diagnosis Date   Anxiety    Colon polyp 2003   Complication of anesthesia    slow to wake up one time   Gastroenteritis    w/ renal insufficiency in  context of protracted nausea & vomitting 2006   GERD (gastroesophageal reflux disease)    H/O hiatal hernia    Headache(784.0)    occasional lifelong- gabapentin  helps a   Hiatal hernia    Hyperlipidemia    Hypertension    w/ LVH on ECHO   Pacemaker 07/2016   Tonsillitis 2006    SURGICAL HISTORY: Past Surgical History:  Procedure Laterality Date   ABDOMINAL HYSTERECTOMY  1976   BSO for endometriosis and bengin tumor   APPENDECTOMY  1970   BREAST LUMPECTOMY Left 70's   X 2   BUNIONECTOMY Bilateral    CATARACT EXTRACTION W/PHACO Left 05/14/2013   Procedure: CATARACT EXTRACTION PHACO AND INTRAOCULAR LENS PLACEMENT (IOC);  Surgeon: Cherene Mania, MD;  Location: [facility];  Service: Ophthalmology;  Laterality: Left;  CDE:9.76   CATARACT EXTRACTION W/PHACO Right 06/08/2013   Procedure: CATARACT EXTRACTION PHACO AND INTRAOCULAR LENS PLACEMENT (IOC);  Surgeon: Cherene Mania, MD;  Location: [facility];  Service: Ophthalmology;  Laterality: Right;  CDE 13.65   CHOLECYSTECTOMY  1970   COLONOSCOPY W/ POLYPECTOMY  2003   Dr Avram   EP IMPLANTABLE DEVICE N/A 07/18/2016   Procedure: Pacemaker Implant;  Surgeon: Elspeth JAYSON Sage, MD;  Location: [facility] INVASIVE CV LAB;  Service: Cardiovascular;  Laterality: N/A;   ESOPHAGEAL MANOMETRY N/A 10/05/2013   Procedure: ESOPHAGEAL MANOMETRY (EM);  Surgeon: Lupita FORBES Avram, MD;  Location: [facility];  Service: Endoscopy;  Laterality: N/A;   ESOPHAGOGASTRODUODENOSCOPY  02/05/13   Large Hiatal Hernia   HERNIA REPAIR     LAPAROSCOPIC NISSEN FUNDOPLICATION Bilateral 10/22/2013   Procedure: LAPAROSCOPIC NISSEN FUNDOPLICATION with hiatal hernia repair ;  Surgeon: Morene ONEIDA Olives, MD;  Location: [facility];  Service: General;  Laterality: Bilateral;   PPM GENERATOR CHANGEOUT N/A 04/07/2024   Procedure: PPM GENERATOR CHANGEOUT;  Surgeon: Cindie Ole ONEIDA, MD;  Location: [facility] INVASIVE CV LAB;  Service: Cardiovascular;  Laterality: N/A;    SOCIAL HISTORY: Social History    Socioeconomic History   Marital status: Widowed    Spouse name: Not on file   Number of children: 2   Years of education: Not on file   Highest education level: Not on file  Occupational History   Occupation: Retired    Associate Professor: RETIRED  Tobacco Use   Smoking status: Never   Smokeless tobacco: Never  Vaping Use   Vaping status: Never Used  Substance and Sexual Activity   Alcohol use: Yes    Comment: Wine-VERY RARELY   Drug use: No   Sexual activity: Not Currently  Other Topics Concern   Not on file  Social History Narrative   Lives alone, husband died Jul 19, 2024   Right Handed   Drinks caffeine rarely   Social Drivers of Health   Financial Resource Strain: Low Risk  (09/25/2023)   Overall Financial Resource Strain (CARDIA)    Difficulty of Paying Living Expenses: Not hard at all  Food Insecurity: No Food Insecurity (09/25/2023)   Hunger Vital Sign    Worried About Programme researcher, broadcasting/film/video in  the Last Year: Never true    Ran Out of Food in the Last Year: Never true  Transportation Needs: No Transportation Needs (09/25/2023)   PRAPARE - Administrator, Civil Service (Medical): No    Lack of Transportation (Non-Medical): No  Physical Activity: Inactive (09/25/2023)   Exercise Vital Sign    Days of Exercise per Week: 0 days    Minutes of Exercise per Session: 0 min  Stress: No Stress Concern Present (09/25/2023)   [employer] of Occupational Health - Occupational Stress Questionnaire    Feeling of Stress : Not at all  Social Connections: Moderately Isolated (09/25/2023)   Social Connection and Isolation Panel    Frequency of Communication with Friends and Family: More than three times a week    Frequency of Social Gatherings with Friends and Family: More than three times a week    Attends Religious Services: 1 to 4 times per year    Active Member of [employer] or Organizations: No    Attends Banker Meetings: Never    Marital Status: Widowed   Intimate Partner Violence: Not At Risk (09/25/2023)   Humiliation, Afraid, Rape, and Kick questionnaire    Fear of Current or Ex-Partner: No    Emotionally Abused: No    Physically Abused: No    Sexually Abused: No    FAMILY HISTORY: Family History  Problem Relation Age of Onset   Heart attack Father 64       fatal MI @ 21   Coronary artery disease Mother        CABG; MI @ 68   Diabetes Mother    Stroke Paternal Grandfather        > 49   Stroke Maternal Grandmother        in 21s   Aneurysm Paternal Grandmother        cns ; in 51s   Diabetes Maternal Aunt    Stomach cancer Maternal Aunt    Breast cancer Maternal Aunt    Heart disease Sister    Coronary artery disease Maternal Grandfather    Colon cancer Neg Hx    Esophageal cancer Neg Hx     ALLERGIES:  is allergic to chlorthalidone , hydrochlorothiazide, losartan  potassium-hctz, norvasc [amlodipine besylate], raloxifene, and rofecoxib.  MEDICATIONS:  Current Outpatient Medications  Medication Sig Dispense Refill   ALPRAZolam  (XANAX ) 0.25 MG tablet Take 1 tablet (0.25 mg total) by mouth 2 (two) times daily as needed for anxiety (Do not drive for 8 hours after taking.  If any falls on medicine stop immediately and let us  know). (Patient not taking: Reported on 04/22/2024) 30 tablet 0   Ascorbic Acid (VITAMIN C PO) Take 1 tablet by mouth daily.     Calcium in Bone Mineral Cmplx 350 MG MISC Take 1 each by mouth in the morning, at noon, and at bedtime.     carvedilol  (COREG ) 6.25 MG tablet Take 1.5 tablets (9.375 mg total) by mouth 2 (two) times daily with a meal. 270 tablet 3   Cholecalciferol (VITAMIN D3) 2000 UNITS TABS Take 1 tablet by mouth daily with lunch.     Cyanocobalamin  (VITAMIN B-12 PO) Take 1 capsule by mouth daily at 2 am.     esomeprazole  (NEXIUM ) 40 MG capsule TAKE 1 CAPSULE BY MOUTH TWICE A DAY BEFORE A MEAL 180 capsule 3   ezetimibe -simvastatin  (VYTORIN ) 10-20 MG tablet TAKE 1 TABLET BY MOUTH EVERYDAY AT  BEDTIME 90 tablet 2   famotidine  (PEPCID ) 40  MG tablet TAKE 1 TABLET BY MOUTH EVERYDAY AT BEDTIME 90 tablet 3   furosemide  (LASIX ) 40 MG tablet TAKE 1 TABLET BY MOUTH EVERY DAY 90 tablet 3   gabapentin  (NEURONTIN ) 100 MG capsule Take 2 capsules (200 mg total) by mouth at bedtime. (Patient taking differently: Take 100-200 mg by mouth at bedtime.) 180 capsule 3   losartan  (COZAAR ) 25 MG tablet TAKE 1 TABLET (25 MG TOTAL) BY MOUTH DAILY. 90 tablet 3   Magnesium  400 MG CAPS Take 400 mg by mouth at bedtime.     Omega-3 Fatty Acids (FISH OIL) 500 MG CAPS Take 500 mg by mouth daily with lunch.     potassium chloride  SA (KLOR-CON  M) 20 MEQ tablet Take 1 tablet (20 mEq total) by mouth daily. 90 tablet 1   No current facility-administered medications for this visit.    REVIEW OF SYSTEMS:   .10 Point review of Systems was done is negative except as noted above.  PHYSICAL EXAMINATION: ECOG PERFORMANCE STATUS: 2 - Symptomatic, <50% confined to bed  . Vitals:   06/22/24 0947 06/22/24 0955  BP: (!) 146/61 (!) 145/61  Pulse: 83   Resp: 18   Temp: 97.7 F (36.5 C)   SpO2: 99%    Filed Weights   06/22/24 0947  Weight: 152 lb 11.2 oz (69.3 kg)   .Body mass index is 30.84 kg/m.  GENERAL:alert, in no acute distress and comfortable SKIN: no acute rashes, no significant lesions EYES: conjunctiva are pink and non-injected, sclera anicteric OROPHARYNX: MMM, 1-2 cms soft tissue nodule in the soft tissue adjacent to the gums in the left lower jaw. NECK: supple, no JVD LYMPH:  no palpable lymphadenopathy in the cervical, axillary or inguinal regions LUNGS: clear to auscultation b/l with normal respiratory effort HEART: regular rate & rhythm ABDOMEN:  normoactive bowel sounds , non tender, not distended. Extremity: no pedal edema PSYCH: alert & oriented x 3 with fluent speech NEURO: no focal motor/sensory deficits    LABORATORY DATA:  I have reviewed the data as listed  .    Latest Ref Rng &  Units 06/22/2024    9:14 AM 04/22/2024    9:38 AM 03/19/2024    8:17 AM  CBC  WBC 4.0 - 10.5 K/uL 6.7  8.1  6.6   Hemoglobin 12.0 - 15.0 g/dL 86.9  87.7  86.6   Hematocrit 36.0 - 46.0 % 40.2  38.6  42.0   Platelets 150 - 400 K/uL 240  255  294     .    Latest Ref Rng & Units 06/22/2024    9:14 AM 04/22/2024    9:38 AM 03/19/2024    8:17 AM  CMP  Glucose 70 - 99 mg/dL 92  90  89   BUN 8 - 23 mg/dL 22  15  22    Creatinine 0.44 - 1.00 mg/dL 8.41  8.69  8.54   Sodium 135 - 145 mmol/L 142  142  140   Potassium 3.5 - 5.1 mmol/L 3.5  3.5  3.9   Chloride 98 - 111 mmol/L 103  102  98   CO2 22 - 32 mmol/L 32  30  23   Calcium 8.9 - 10.3 mg/dL 9.7  9.9  9.8   Total Protein 6.5 - 8.1 g/dL 6.9  6.6    Total Bilirubin 0.0 - 1.2 mg/dL 0.7  0.6    Alkaline Phos 38 - 126 U/L 51     AST 15 - 41 U/L 19  16    ALT 0 - 44 U/L 12  10     . Lab Results  Component Value Date   LDH 180 06/22/2024     08/05/2023 [facility] pathology:    07/31/2023 pathology:    RADIOGRAPHIC STUDIES: I have personally reviewed the radiological images as listed and agreed with the findings in the report. No results found.   ASSESSMENT & PLAN:  88 y.o. female with   Stage IE low-grade follicular lymphoma  Presented as a nodule over the left cheek likely in a minor salivary gland.  Status post excision 07/31/2023. Now with a recurrent nodule in the same area.   PLAN: - Discussed lab results from today CBC is within normal limits CMP shows stable chronic kidney disease with a creatinine of 1.58 LDH has improved from 210 to 180. Evaluated and examined the patient's left cheek nodule that is adjacent to her gums in the left lower jaw. We discussed that this could be inflamed scar tissue from her previous surgical excision or could be locally recurrent low-grade follicular lymphoma. We discussed that the options would be 1.  Conservative management with continued monitoring of the nodule 2.  Resampling the nodule to  confirm whether this is just residual scar tissue versus recurrent low-grade follicular lymphoma.  - Patient notes that though she realizes she is 88 years old she would like to definitively know if this is recurrent lymphoma and is agreeable with referral to ENT surgeon Dr. Jesus. - We discussed that it would be up to the surgeon to determine if a needle biopsy for tissue diagnosis or removing the palpable nodule would be the best way for tissue diagnosis. - We will see her back after sampling of the nodule and determine next steps in treatment. - If this is recurrent low-grade follicular lymphoma and the entire nodule was removed then we will continue to just monitor her clinically and with intermittent imaging. - If the nodule can only be sampled with a needle biopsy and shows recurrent follicular lymphoma she would have the option of monitoring versus local radiation therapy.  FOLLOW-UP: Referral to ENT Dr. Jesus for left cheek nodule. Return to clinic with Dr. Onesimo with labs in 6 weeks  The total time spent in the appointment was 30 minutes*.  All of the patient's questions were answered with apparent satisfaction. The patient knows to call the clinic with any problems, questions or concerns.   Emaline Onesimo MD MS AAHIVMS [facility] Hematology/Oncology Physician [facility]  .*Total Encounter Time as defined by the Centers for Medicare and Medicaid Services includes, in addition to the face-to-face time of a patient visit (documented in the note above) non-face-to-face time: obtaining and reviewing outside history, ordering and reviewing medications, tests or procedures, care coordination (communications with other health care professionals or caregivers) and documentation in the medical record.

## 2024-06-23 ENCOUNTER — Telehealth: Payer: Self-pay | Admitting: Hematology

## 2024-06-23 NOTE — Telephone Encounter (Signed)
 Kylie Patrick has been scheduled and notified of her 6 week follow up appointment.

## 2024-07-08 ENCOUNTER — Other Ambulatory Visit: Payer: Self-pay | Admitting: Family Medicine

## 2024-07-09 ENCOUNTER — Ambulatory Visit: Admitting: Physician Assistant

## 2024-07-09 DIAGNOSIS — C8290 Follicular lymphoma, unspecified, unspecified site: Secondary | ICD-10-CM | POA: Diagnosis not present

## 2024-07-13 ENCOUNTER — Ambulatory Visit (INDEPENDENT_AMBULATORY_CARE_PROVIDER_SITE_OTHER)

## 2024-07-13 DIAGNOSIS — I442 Atrioventricular block, complete: Secondary | ICD-10-CM | POA: Diagnosis not present

## 2024-07-15 ENCOUNTER — Ambulatory Visit (INDEPENDENT_AMBULATORY_CARE_PROVIDER_SITE_OTHER): Admitting: Family Medicine

## 2024-07-15 ENCOUNTER — Encounter: Payer: Self-pay | Admitting: Family Medicine

## 2024-07-15 VITALS — BP 122/80 | HR 67 | Temp 98.1°F | Ht 59.0 in | Wt 152.2 lb

## 2024-07-15 DIAGNOSIS — Z23 Encounter for immunization: Secondary | ICD-10-CM | POA: Diagnosis not present

## 2024-07-15 DIAGNOSIS — M47816 Spondylosis without myelopathy or radiculopathy, lumbar region: Secondary | ICD-10-CM | POA: Diagnosis not present

## 2024-07-15 DIAGNOSIS — Z950 Presence of cardiac pacemaker: Secondary | ICD-10-CM | POA: Diagnosis not present

## 2024-07-15 DIAGNOSIS — I10 Essential (primary) hypertension: Secondary | ICD-10-CM | POA: Diagnosis not present

## 2024-07-15 DIAGNOSIS — M7918 Myalgia, other site: Secondary | ICD-10-CM | POA: Diagnosis not present

## 2024-07-15 DIAGNOSIS — M51361 Other intervertebral disc degeneration, lumbar region with lower extremity pain only: Secondary | ICD-10-CM | POA: Diagnosis not present

## 2024-07-15 LAB — CUP PACEART REMOTE DEVICE CHECK
Battery Remaining Longevity: 99 mo
Battery Voltage: 3.17 V
Brady Statistic AP VP Percent: 54.31 %
Brady Statistic AP VS Percent: 0 %
Brady Statistic AS VP Percent: 45.55 %
Brady Statistic AS VS Percent: 0.14 %
Brady Statistic RA Percent Paced: 54.28 %
Brady Statistic RV Percent Paced: 99.86 %
Date Time Interrogation Session: 20250901014921
Implantable Lead Connection Status: 753985
Implantable Lead Connection Status: 753985
Implantable Lead Implant Date: 20170906
Implantable Lead Implant Date: 20170906
Implantable Lead Location: 753859
Implantable Lead Location: 753860
Implantable Lead Model: 3830
Implantable Lead Model: 5076
Implantable Pulse Generator Implant Date: 20250527
Lead Channel Impedance Value: 304 Ohm
Lead Channel Impedance Value: 418 Ohm
Lead Channel Impedance Value: 418 Ohm
Lead Channel Impedance Value: 513 Ohm
Lead Channel Pacing Threshold Amplitude: 0.5 V
Lead Channel Pacing Threshold Amplitude: 1.625 V
Lead Channel Pacing Threshold Pulse Width: 0.4 ms
Lead Channel Pacing Threshold Pulse Width: 0.4 ms
Lead Channel Sensing Intrinsic Amplitude: 1.625 mV
Lead Channel Sensing Intrinsic Amplitude: 1.625 mV
Lead Channel Setting Pacing Amplitude: 1.5 V
Lead Channel Setting Pacing Amplitude: 2.5 V
Lead Channel Setting Pacing Pulse Width: 1 ms
Lead Channel Setting Sensing Sensitivity: 1.2 mV
Zone Setting Status: 755011

## 2024-07-15 MED ORDER — GABAPENTIN 100 MG PO CAPS: ORAL_CAPSULE | ORAL | 3 refills | Status: AC

## 2024-07-15 MED ORDER — LOSARTAN POTASSIUM 25 MG PO TABS: 12.5000 mg | ORAL_TABLET | Freq: Every day | ORAL | 3 refills | Status: AC

## 2024-07-15 NOTE — Progress Notes (Signed)
 Phone [phone] In person visit   Subjective:   Kylie Patrick is a 88 y.o. year old very pleasant female patient who presents for/with See problem oriented charting Chief Complaint  Patient presents with   Leg Pain    Bilateral pain x3 months getting worse; feels like she's sitting on lumps;    Discussed the use of AI scribe software for clinical note transcription with the patient, who gave verbal consent to proceed.  Past Medical History-  Patient Active Problem List   Diagnosis Date Noted   Atrial fibrillation (HCC) -SCAF 08/14/2022    Priority: High   Pacemaker - MDT 08/11/2020    Priority: High   Complete heart block (HCC) 04/05/2020    Priority: High   Neuropathy 10/09/2023    Priority: Medium    Aortic atherosclerosis (HCC) 04/26/2020    Priority: Medium    Osteoporosis 04/25/2020    Priority: Medium    Carpal tunnel syndrome, right 05/02/2019    Priority: Medium    Diastolic dysfunction 07/12/2016    Priority: Medium    Hyperglycemia 10/09/2014    Priority: Medium    GERD (gastroesophageal reflux disease) 10/22/2013    Priority: Medium    CKD (chronic kidney disease) stage 3, GFR 30-59 ml/min (HCC) 05/05/2008    Priority: Medium    Anxiety state 04/20/2008    Priority: Medium    HYPERLIPIDEMIA 08/12/2007    Priority: Medium    Essential hypertension 08/12/2007    Priority: Medium    Rash 07/04/2017    Priority: Low   Hiatal hernia 08/03/2013    Priority: Low   History of colonic polyps 01/09/2013    Priority: Low   NIGHT SWEATS 02/19/2008    Priority: Low   Exercise intolerance 08/14/2022     Medications- reviewed and updated Current Outpatient Medications  Medication Sig Dispense Refill   ALPRAZolam  (XANAX ) 0.25 MG tablet Take 1 tablet (0.25 mg total) by mouth 2 (two) times daily as needed for anxiety (Do not drive for 8 hours after taking.  If any falls on medicine stop immediately and let us  know). 30 tablet 0   Ascorbic Acid (VITAMIN C  PO) Take 1 tablet by mouth daily.     Calcium in Bone Mineral Cmplx 350 MG MISC Take 1 each by mouth in the morning, at noon, and at bedtime.     carvedilol  (COREG ) 6.25 MG tablet Take 1.5 tablets (9.375 mg total) by mouth 2 (two) times daily with a meal. 270 tablet 3   Cholecalciferol (VITAMIN D3) 2000 UNITS TABS Take 1 tablet by mouth daily with lunch.     Cyanocobalamin  (VITAMIN B-12 PO) Take 1 capsule by mouth daily at 2 am.     esomeprazole  (NEXIUM ) 40 MG capsule TAKE 1 CAPSULE BY MOUTH TWICE A DAY BEFORE A MEAL 180 capsule 3   ezetimibe -simvastatin  (VYTORIN ) 10-20 MG tablet TAKE 1 TABLET BY MOUTH EVERYDAY AT BEDTIME 90 tablet 2   famotidine  (PEPCID ) 40 MG tablet TAKE 1 TABLET BY MOUTH EVERYDAY AT BEDTIME 90 tablet 3   furosemide  (LASIX ) 40 MG tablet TAKE 1 TABLET BY MOUTH EVERY DAY 90 tablet 3   Magnesium  400 MG CAPS Take 400 mg by mouth at bedtime.     Omega-3 Fatty Acids (FISH OIL) 500 MG CAPS Take 500 mg by mouth daily with lunch.     potassium chloride  SA (KLOR-CON  M) 20 MEQ tablet Take 1 tablet (20 mEq total) by mouth daily. 90 tablet 1   gabapentin  (  NEURONTIN ) 100 MG capsule Take 1 pill in the morning and 2 before bed 270 capsule 3   losartan  (COZAAR ) 25 MG tablet Take 0.5 tablets (12.5 mg total) by mouth daily. 45 tablet 3   No current facility-administered medications for this visit.     Objective:  BP 122/80 (BP Location: Left Arm, Patient Position: Sitting, Cuff Size: Normal)   Pulse 67   Temp 98.1 F (36.7 C) (Temporal)   Ht 4' 11 (1.499 m)   Wt 152 lb 3.2 oz (69 kg)   SpO2 95%   BMI 30.74 kg/m  Gen: NAD, resting comfortably CV: RRR no murmurs rubs or gallops Lungs: CTAB no crackles, wheeze, rhonchi Ext: no edema Skin: warm, dry  Back - Normal skin, Spine with normal alignment and no deformity.  No tenderness to vertebral process palpation other than mild pain with palpation approximately L1.  Paraspinous muscles are not tender and without spasm.   Range of motion  is full at neck . Negative Straight leg raise.  Neuro- no saddle anesthesia, 5/5 strength lower extremities    Assessment and Plan    # Bilateral buttocks pain radiating down into bilateral legs for 3 months patient with known history of neuropathy previously seen by neurology S: She has a known history of neuropathy and has been under the care of neurology since August 10, 2021. Her last neurology visit was on February 12, 2022, where she reported bilateral feet numbness, initially starting in the left foot. At that time, there was a discussion about adjusting her gabapentin  dosage to 200 mg or 100 mg twice daily. She has preferred not to return to neurology though.  at that time reported right leg pain originating from her lateral hip, extending down the side of her leg, but not crossing the knee, and occasionally into the calf.   Most recently over last 3 months she has noted pain into her bilateral buttocks that radiates down back of leg at times and even into calves. She rates her pain as 5 to 6 out of 10, describing it as 'pretty bad'. She experiences weakness in her legs on some days, particularly when the pain intensifies, and occasionally feels a burning sensation that can extend down her legs.   She has been on gabapentin  200 mg in  the evening. However, she reports that while gabapentin  helps, it does not provide much daytime relief.  She has a history of severe multilevel degenerative disc disease as shown in lumbar spine films from April 26, 2020. She has not had an MRI due to her pacemaker.  No new incontinence, numbness, or tingling in the groin, and she has not experienced any falls. She uses a cane for mobility even at home. No Back pain reported A/P: Peripheral neuropathy with lumbar degenerative disc disease for years- but with new onset bilateral buttocks pain for 3 months intensifying Chronic peripheral neuropathy with ongoing numbness and tingling in the feet. Pain rated at 5 to  6 out of 10, Gabapentin  provides some relief but not sufficient daytime relief. Lumbar spine films from April 26, 2020, showed severe multilevel degenerative disc disease. MRI is not advisable due to pacemaker- she was never told that pacemaker was compatible with MRI. Concern for lumbar stenosis - Add gabapentin  100 mg in the morning. - Refer to Dr. Joane for further evaluation due to inability to perform MRI. -she prefers not to return to neurology -considered CT scan or CT myelography but wanted Dr. Virgilio opinion with my  limited experience with those for radicular type pain patterns   # Follicular lymphoma-followed by Dr. Waunita removed and PET/CT reassuring- but has some swelling in area again and strongly considering excisional biopsy with DrRONITA Rosen  % complete heart block- Led to pacemaker placement - follows with Dr. Cindie (prior Dr. Fernande) yearly. As far as she is aware was told not to have MRI -Updated generator Apr 07, 2024  #hypertension S: medication:  Carvedilol  9.375 mg BID , Lasix  40mg  daily, losartan  12.5mg  (reduced by Dr. Prescilla from 100mg  originally to 25 mg)  A/P:well controlled continue current medications    Recommended follow up: Return for next already scheduled visit or sooner if needed. Future Appointments  Date Time Provider Department Center  07/16/2024 11:20 AM Leverne Charlies Helling, PA-C [facility]  08/05/2024 11:30 AM [facility] None  08/05/2024 12:00 PM Onesimo Emaline Brink, MD [facility] None  10/01/2024 10:40 AM [facility] ANNUAL WELLNESS VISIT 1 [facility] Kylie Patrick  10/12/2024  7:10 AM [facility] DEVICE REMOTES [facility]  10/22/2024 10:20 AM Katrinka Garnette KIDD, MD [facility]  01/11/2025  7:00 AM [facility] DEVICE REMOTES [facility]  04/12/2025  7:00 AM [facility] DEVICE REMOTES [facility]  07/12/2025  7:00 AM [facility] DEVICE REMOTES [facility]  10/11/2025  7:00 AM [facility] DEVICE REMOTES [facility]  01/10/2026  7:00  AM [facility] DEVICE REMOTES [facility]  04/12/2026  7:00 AM [facility] DEVICE REMOTES [facility]    Lab/Order associations:   ICD-10-CM   1. Bilateral buttock pain  M79.18 Ambulatory referral to Sports Medicine    2. Arthritis of lumbar spine  M47.816 Ambulatory referral to Sports Medicine    3. Pacemaker - MDT  Z95.0 Ambulatory referral to Sports Medicine    4. Essential hypertension  I10     5. Degeneration of intervertebral disc of lumbar region with lower extremity pain  M51.361     6. Need for influenza vaccination  Z23 Flu vaccine HIGH DOSE PF(Fluzone Trivalent)    CANCELED: Flu vaccine HIGH DOSE PF(Fluzone Trivalent)      Meds ordered this encounter  Medications   gabapentin  (NEURONTIN ) 100 MG capsule    Sig: Take 1 pill in the morning and 2 before bed    Dispense:  270 capsule    Refill:  3   losartan  (COZAAR ) 25 MG tablet    Sig: Take 0.5 tablets (12.5 mg total) by mouth daily.    Dispense:  45 tablet    Refill:  3    Return precautions advised.  Garnette Katrinka, MD

## 2024-07-15 NOTE — Patient Instructions (Addendum)
 We have placed a referral for you today to Dr. Joane with sports medicine- please call their # if you do not hear within a week (may be listed below or you may see mychart message within a few days with #).   Continue 200 mg gabapentin  before bed but trial 100 mg in the morning to see if that helps daytimes symptoms while waiting on Dr. Joane and if too dizzy or fatigued just stop  Recommended follow up: Return for next already scheduled visit or sooner if needed.

## 2024-07-15 NOTE — Progress Notes (Unsigned)
  Cardiology Office Note:  .   Date:  07/15/2024  ID:  [name], DOB 11/09/1936, MRN [id number] PCP: Katrinka Garnette KIDD, MD  [facility] Providers Cardiologist:  None Electrophysiologist:  Elspeth Sage, MD >> Dr. Cindie {  History of Present Illness: .   Kylie Patrick is a 88 y.o. female w/PMHx of  HTN, HFpEF CHB w/PPM SCAF  She saw Dr. Sage last July 2024 She saw Jodie 02/11/24, mild DOE with exertion, ankle edema L>R towards end of day Device was ERI and planned for gen change  Gen change 04/07/24 (Dr. Cindie)  + wound check visit, well healed, dependent   Today's visit is scheduled as her post gen change visit ROS:   She feels very well No site concerns No CP, palpitations or cardiac awareness No rest SOB, no DOE with her ADLs Will get winded with longer distances. No near syncope or syncope  Feels well today despite BP, manual recheck by myself is  R: 96/60 L: 98/60  Yesterday at her PMD was 122/80 At home generally 110's-120's byt her cuff   Device information MDT dual chamber PPM implanted 07/18/2016, gen change 04/07/24    Studies Reviewed: SABRA    EKG not done today  DEVICE interrogation done today and reviewed by myself Battery and lead measurements are good No HVR episodes AMS is <0.1% + AFib longest 1hr  06/03/23: TTE 1. Left ventricular ejection fraction, by estimation, is 55 to 60%. The  left ventricle has normal function. The left ventricle demonstrates  regional wall motion abnormalities (abnormal septal motion). There is mild  concentric left ventricular  hypertrophy. Left ventricular diastolic parameters are consistent with  Grade I diastolic dysfunction is indeterminate.   2. Right ventricular systolic function is hyperdynamic. The right  ventricular size is normal. There is normal pulmonary artery systolic  pressure. The estimated right ventricular systolic pressure is 20.5 mmHg.   3. The mitral valve is grossly normal.  Trivial mitral valve  regurgitation. No evidence of mitral stenosis.   4. The aortic valve was not well visualized. Aortic valve regurgitation  is trivial. No aortic stenosis is present.    Risk Assessment/Calculations:    Physical Exam:   VS:  There were no vitals taken for this visit.   Wt Readings from Last 3 Encounters:  07/15/24 152 lb 3.2 oz (69 kg)  06/22/24 152 lb 11.2 oz (69.3 kg)  04/22/24 156 lb (70.8 kg)    GEN: Well nourished, well developed in no acute distress NECK: No JVD; No carotid bruits CARDIAC: RRR, no murmurs, rubs, gallops RESPIRATORY:  CTA b/l without rales, wheezing or rhonchi  ABDOMEN: Soft, non-tender, non-distended EXTREMITIES: No edema; No deformity   PPM site: is stable, no thinning, fluctuation, tethering  ASSESSMENT AND PLAN: .    PPM intact function no programming changes made  HTN Hypotension today, no symptoms Will reduce her coreg  to 6.25mg  BID  HFpEF No symptoms or exam findings of volume OL  SCAF <0.1% Discussed today Continue to follow via her PPM     Dispo: remotes as usual, back in clinic in a year, sooner if needed  Signed, Charlies Macario Arthur, PA-C

## 2024-07-16 ENCOUNTER — Encounter: Payer: Self-pay | Admitting: Physician Assistant

## 2024-07-16 ENCOUNTER — Ambulatory Visit: Attending: Physician Assistant | Admitting: Physician Assistant

## 2024-07-16 VITALS — BP 85/56 | HR 71 | Ht 59.0 in | Wt 152.6 lb

## 2024-07-16 DIAGNOSIS — Z950 Presence of cardiac pacemaker: Secondary | ICD-10-CM

## 2024-07-16 DIAGNOSIS — I10 Essential (primary) hypertension: Secondary | ICD-10-CM | POA: Diagnosis not present

## 2024-07-16 DIAGNOSIS — I5032 Chronic diastolic (congestive) heart failure: Secondary | ICD-10-CM

## 2024-07-16 LAB — CUP PACEART INCLINIC DEVICE CHECK
Battery Remaining Longevity: 99 mo
Battery Voltage: 3.16 V
Brady Statistic AP VP Percent: 47.1 %
Brady Statistic AP VS Percent: 0 %
Brady Statistic AS VP Percent: 52.82 %
Brady Statistic AS VS Percent: 0.08 %
Brady Statistic RA Percent Paced: 47.01 %
Brady Statistic RV Percent Paced: 99.92 %
Date Time Interrogation Session: 20250904115542
Implantable Lead Connection Status: 753985
Implantable Lead Connection Status: 753985
Implantable Lead Implant Date: 20170906
Implantable Lead Implant Date: 20170906
Implantable Lead Location: 753859
Implantable Lead Location: 753860
Implantable Lead Model: 3830
Implantable Lead Model: 5076
Implantable Pulse Generator Implant Date: 20250527
Lead Channel Impedance Value: 304 Ohm
Lead Channel Impedance Value: 380 Ohm
Lead Channel Impedance Value: 418 Ohm
Lead Channel Impedance Value: 494 Ohm
Lead Channel Pacing Threshold Amplitude: 0.625 V
Lead Channel Pacing Threshold Amplitude: 1.625 V
Lead Channel Pacing Threshold Pulse Width: 0.4 ms
Lead Channel Pacing Threshold Pulse Width: 0.4 ms
Lead Channel Sensing Intrinsic Amplitude: 1.25 mV
Lead Channel Sensing Intrinsic Amplitude: 1.875 mV
Lead Channel Setting Pacing Amplitude: 1.5 V
Lead Channel Setting Pacing Amplitude: 2.5 V
Lead Channel Setting Pacing Pulse Width: 1 ms
Lead Channel Setting Sensing Sensitivity: 1.2 mV
Zone Setting Status: 755011

## 2024-07-16 MED ORDER — CARVEDILOL 6.25 MG PO TABS: 6.2500 mg | ORAL_TABLET | Freq: Two times a day (BID) | ORAL | Status: DC

## 2024-07-16 NOTE — Patient Instructions (Addendum)
 Medication Instructions:   START TAKING: COREG  6.25  TWICE A DAY    *If you need a refill on your cardiac medications before your next appointment, please call your pharmacy*   Lab Work:  NONE ORDERED  TODAY     If you have labs (blood work) drawn today and your tests are completely normal, you will receive your results only by: MyChart Message (if you have MyChart) OR A paper copy in the mail If you have any lab test that is abnormal or we need to change your treatment, we will call you to review the results.   Testing/Procedures: NONE ORDERED  TODAY      Follow-Up: At [facility], you and your health needs are our priority.  As part of our continuing mission to provide you with exceptional heart care, our providers are all part of one team.  This team includes your primary Cardiologist (physician) and Advanced Practice Providers or APPs (Physician Assistants and Nurse Practitioners) who all work together to provide you with the care you need, when you need it.  Your next appointment:    1 year(s)   Provider:    Ole Holts, MD or Charlies Arthur, PA-C     We recommend signing up for the patient portal called MyChart.  Sign up information is provided on this After Visit Summary.  MyChart is used to connect with patients for Virtual Visits (Telemedicine).  Patients are able to view lab/test results, encounter notes, upcoming appointments, etc.  Non-urgent messages can be sent to your provider as well.   To learn more about what you can do with MyChart, go to [email or web address].   Other Instructions

## 2024-07-18 ENCOUNTER — Ambulatory Visit: Payer: Self-pay | Admitting: Cardiology

## 2024-07-21 NOTE — Progress Notes (Signed)
 Remote PPM Transmission

## 2024-07-23 ENCOUNTER — Ambulatory Visit: Admitting: Family Medicine

## 2024-07-31 ENCOUNTER — Other Ambulatory Visit (HOSPITAL_COMMUNITY)
Admission: RE | Admit: 2024-07-31 | Discharge: 2024-07-31 | Disposition: A | Discharge status: Home / Self Care | Source: Ambulatory Visit | Attending: Otolaryngology | Admitting: Otolaryngology

## 2024-07-31 ENCOUNTER — Other Ambulatory Visit: Payer: Self-pay | Admitting: Otolaryngology

## 2024-07-31 DIAGNOSIS — C8219 Follicular lymphoma grade II, extranodal and solid organ sites: Secondary | ICD-10-CM | POA: Diagnosis not present

## 2024-07-31 DIAGNOSIS — C8290 Follicular lymphoma, unspecified, unspecified site: Secondary | ICD-10-CM | POA: Diagnosis not present

## 2024-08-04 ENCOUNTER — Other Ambulatory Visit: Payer: Self-pay

## 2024-08-04 DIAGNOSIS — C8291 Follicular lymphoma, unspecified, lymph nodes of head, face, and neck: Secondary | ICD-10-CM

## 2024-08-05 ENCOUNTER — Inpatient Hospital Stay

## 2024-08-05 ENCOUNTER — Inpatient Hospital Stay: Attending: Hematology | Admitting: Hematology

## 2024-08-05 VITALS — BP 117/77 | HR 98 | Temp 97.9°F | Resp 18 | Ht 63.0 in | Wt 149.2 lb

## 2024-08-05 DIAGNOSIS — C8291 Follicular lymphoma, unspecified, lymph nodes of head, face, and neck: Secondary | ICD-10-CM

## 2024-08-05 DIAGNOSIS — C8201 Follicular lymphoma grade I, lymph nodes of head, face, and neck: Secondary | ICD-10-CM | POA: Insufficient documentation

## 2024-08-05 LAB — CMP (CANCER CENTER ONLY)
ALT: 11 U/L (ref 0–44)
AST: 17 U/L (ref 15–41)
Albumin: 4.3 g/dL (ref 3.5–5.0)
Alkaline Phosphatase: 51 U/L (ref 38–126)
Anion gap: 10 (ref 5–15)
BUN: 18 mg/dL (ref 8–23)
CO2: 30 mmol/L (ref 22–32)
Calcium: 10.1 mg/dL (ref 8.9–10.3)
Chloride: 103 mmol/L (ref 98–111)
Creatinine: 1.48 mg/dL — ABNORMAL HIGH (ref 0.44–1.00)
GFR, Estimated: 34 mL/min — ABNORMAL LOW (ref >60)
Glucose, Bld: 92 mg/dL (ref 70–99)
Potassium: 3.7 mmol/L (ref 3.5–5.1)
Sodium: 143 mmol/L (ref 135–145)
Total Bilirubin: 0.7 mg/dL (ref 0.0–1.2)
Total Protein: 7.2 g/dL (ref 6.5–8.1)

## 2024-08-05 LAB — CBC WITH DIFFERENTIAL (CANCER CENTER ONLY)
Abs Immature Granulocytes: 0.02 K/uL (ref 0.00–0.07)
Basophils Absolute: 0.1 K/uL (ref 0.0–0.1)
Basophils Relative: 1 %
Eosinophils Absolute: 0.2 K/uL (ref 0.0–0.5)
Eosinophils Relative: 2 %
HCT: 40.4 % (ref 36.0–46.0)
Hemoglobin: 13.2 g/dL (ref 12.0–15.0)
Immature Granulocytes: 0 %
Lymphocytes Relative: 28 %
Lymphs Abs: 2.2 K/uL (ref 0.7–4.0)
MCH: 29.1 pg (ref 26.0–34.0)
MCHC: 32.7 g/dL (ref 30.0–36.0)
MCV: 89.2 fL (ref 80.0–100.0)
Monocytes Absolute: 0.8 K/uL (ref 0.1–1.0)
Monocytes Relative: 10 %
Neutro Abs: 4.7 K/uL (ref 1.7–7.7)
Neutrophils Relative %: 59 %
Platelet Count: 270 K/uL (ref 150–400)
RBC: 4.53 MIL/uL (ref 3.87–5.11)
RDW: 14.3 % (ref 11.5–15.5)
WBC Count: 7.9 K/uL (ref 4.0–10.5)
nRBC: 0 % (ref 0.0–0.2)

## 2024-08-05 LAB — LACTATE DEHYDROGENASE: LDH: 182 U/L (ref 98–192)

## 2024-08-05 LAB — SURGICAL PATHOLOGY

## 2024-08-05 NOTE — Progress Notes (Signed)
 HEMATOLOGY ONCOLOGY PROGRESS NOTE  Date of service: 08/05/2024  Patient Care Team: Kylie Garnette KIDD, MD as PCP - General (Family Medicine) Kylie Elspeth BROCKS, MD as PCP - Electrophysiology (Cardiology)  CHIEF COMPLAINTS/PURPOSE OF CONSULTATION:  Evaluation and management of  low-grade follicular lymphoma   HISTORY OF PRESENTING ILLNESS:    Kylie Patrick is a wonderful 88 y.o. female who has been referred to us  by Kylie Elia KIDD, MD for evaluation and management of low-grade follicular lymphoma.    She was seen by Kylie Elia KIDD, MD on 06/04/2023 and reported a mouth mass at least 2 x 2 cm in front of left lower jaw.    Today, she is accompanied by her daughter-in-law and niece. She reports that she noticed the small nodule in her left cheek after resting her cheek in her palm. The nodule appeared relatively quickly and did not change over time. She has not had any significantly issues with the nodule. Patient also reports noticing a small nodule in her inner right cheek. She denies any other lumps/bumps in any other areas.    Patient denies any  major vision issues, chest pain, abdominal pain, change bowel habits, change in urination, new leg swelling, unexplained fever, chills, night sweats, weight loss, or significant new fatigue. Patient does sometimes endorse swallowing issues only at night.    She reports having neuropathy in her bilateral LEs. Patient denies any history of DM. Her symptoms began with nerve damage between her toes. Her nerve damage is stable at this time. However, her neuropathy has radiated to ankles. She manages her neuropathy with gabapentin .    Patient regularly takes her blood pressure medication and her BP is well controlled.    She reports a 20-pound weight loss attributed to stress over the last two years related to the loss of close family members including her son from lung cancer and her husband over the last couple of years. Patient reports having a  lack of appetite likely related to anxiety and stressful factors.    She denies any family history of lupus, RA, Sjogren's or other autoimmune disorders. Patient denies any issues with dry mouth or dry eyes. Patient denies any thyroid  issues or history of stroke.    Patient reports that she had mouth surgery several years ago after a fall causing teeth injury. Patient does have acid reflux which is managed by medication. Patient denies any new medications.    Patient does have a lipoma in her back. She notes mild scoliosis on previous imaging. Her sister and aunt have similar scoliosis issues.    She regularly takes vitamin B complex, fish oil, calcium, magnesium . She takes vitamin b12 regularly due to having low levels on recent labs. She previously took folic acid supplements, but has not recently due to running out.    She reports being very easily fatigued, which is a recent change over the last 3 months. Patient reports that she is more easily SOB recently, especially on exertion. She was seen by an EP on 07/12/2023 and reports that she requires continuous pacing and her pacemaker will have to be changed soon.  SUMMARY OF ONCOLOGIC HISTORY: Oncology History   No history exists.    INTERVAL HISTORY: Kylie Patrick is a 88 y.o. female here for continued evaluation and management of low-grade follicular lymphoma. Ambulating with a cane.  Last seen by me on 06/22/2024, where she'd mentioned growth of the interior left cheek nodule and we mutually agreed she'd undergo a  biopsy and last Friday she had this done by Dr. Ida Loader, ENT - awaiting pathology results.   She does note pain shortly following her biopsy, but this has since improved along with swelling and bruising that also occurred afterwards. Denies no acute symptoms - new lumps/bumps, stomach pain, or change in bowel habits.  REVIEW OF SYSTEMS:    10 Point review of systems of done and is negative except as noted above.  . Past  Medical History:  Diagnosis Date   Anxiety    Colon polyp 2003   Complication of anesthesia    slow to wake up one time   Gastroenteritis    w/ renal insufficiency in context of protracted nausea & vomitting 2006   GERD (gastroesophageal reflux disease)    H/O hiatal hernia    Headache(784.0)    occasional lifelong- gabapentin  helps a   Hiatal hernia    Hyperlipidemia    Hypertension    w/ LVH on ECHO   Pacemaker 07/2016   Tonsillitis 2006    . Past Surgical History:  Procedure Laterality Date   ABDOMINAL HYSTERECTOMY  1976   BSO for endometriosis and bengin tumor   APPENDECTOMY  1970   BREAST LUMPECTOMY Left 70's   X 2   BUNIONECTOMY Bilateral    CATARACT EXTRACTION W/PHACO Left 05/14/2013   Procedure: CATARACT EXTRACTION PHACO AND INTRAOCULAR LENS PLACEMENT (IOC);  Surgeon: Cherene Mania, MD;  Location: [facility];  Service: Ophthalmology;  Laterality: Left;  CDE:9.76   CATARACT EXTRACTION W/PHACO Right 06/08/2013   Procedure: CATARACT EXTRACTION PHACO AND INTRAOCULAR LENS PLACEMENT (IOC);  Surgeon: Cherene Mania, MD;  Location: [facility];  Service: Ophthalmology;  Laterality: Right;  CDE 13.65   CHOLECYSTECTOMY  1970   COLONOSCOPY W/ POLYPECTOMY  2003   Dr Avram   EP IMPLANTABLE DEVICE N/A 07/18/2016   Procedure: Pacemaker Implant;  Surgeon: Elspeth JAYSON Sage, MD;  Location: [facility] INVASIVE CV LAB;  Service: Cardiovascular;  Laterality: N/A;   ESOPHAGEAL MANOMETRY N/A 10/05/2013   Procedure: ESOPHAGEAL MANOMETRY (EM);  Surgeon: Lupita FORBES Avram, MD;  Location: [facility];  Service: Endoscopy;  Laterality: N/A;   ESOPHAGOGASTRODUODENOSCOPY  02/05/13   Large Hiatal Hernia   HERNIA REPAIR     LAPAROSCOPIC NISSEN FUNDOPLICATION Bilateral 10/22/2013   Procedure: LAPAROSCOPIC NISSEN FUNDOPLICATION with hiatal hernia repair ;  Surgeon: Morene ONEIDA Olives, MD;  Location: [facility];  Service: General;  Laterality: Bilateral;   PPM GENERATOR CHANGEOUT N/A 04/07/2024   Procedure: PPM GENERATOR CHANGEOUT;   Surgeon: Cindie Ole ONEIDA, MD;  Location: [facility] INVASIVE CV LAB;  Service: Cardiovascular;  Laterality: N/A;    . Social History   Tobacco Use   Smoking status: Never   Smokeless tobacco: Never  Vaping Use   Vaping status: Never Used  Substance Use Topics   Alcohol use: Yes    Comment: Wine-VERY RARELY   Drug use: No    ALLERGIES:  is allergic to chlorthalidone , hydrochlorothiazide, losartan  potassium-hctz, norvasc [amlodipine besylate], raloxifene, and rofecoxib.  MEDICATIONS:  Current Outpatient Medications  Medication Sig Dispense Refill   ALPRAZolam  (XANAX ) 0.25 MG tablet Take 1 tablet (0.25 mg total) by mouth 2 (two) times daily as needed for anxiety (Do not drive for 8 hours after taking.  If any falls on medicine stop immediately and let us  know). 30 tablet 0   Ascorbic Acid (VITAMIN C PO) Take 1 tablet by mouth daily.     Calcium in Bone Mineral Cmplx 350 MG MISC Take 1 each  by mouth in the morning, at noon, and at bedtime.     carvedilol  (COREG ) 6.25 MG tablet Take 1 tablet (6.25 mg total) by mouth 2 (two) times daily with a meal.     Cholecalciferol (VITAMIN D3) 2000 UNITS TABS Take 1 tablet by mouth daily with lunch.     Cyanocobalamin  (VITAMIN B-12 PO) Take 1 capsule by mouth daily at 2 am.     esomeprazole  (NEXIUM ) 40 MG capsule TAKE 1 CAPSULE BY MOUTH TWICE A DAY BEFORE A MEAL 180 capsule 3   ezetimibe -simvastatin  (VYTORIN ) 10-20 MG tablet TAKE 1 TABLET BY MOUTH EVERYDAY AT BEDTIME 90 tablet 2   famotidine  (PEPCID ) 40 MG tablet TAKE 1 TABLET BY MOUTH EVERYDAY AT BEDTIME 90 tablet 3   furosemide  (LASIX ) 40 MG tablet TAKE 1 TABLET BY MOUTH EVERY DAY 90 tablet 3   gabapentin  (NEURONTIN ) 100 MG capsule Take 1 pill in the morning and 2 before bed 270 capsule 3   losartan  (COZAAR ) 25 MG tablet Take 0.5 tablets (12.5 mg total) by mouth daily. 45 tablet 3   Magnesium  400 MG CAPS Take 400 mg by mouth at bedtime.     Omega-3 Fatty Acids (FISH OIL) 500 MG CAPS Take 500 mg by  mouth daily with lunch.     potassium chloride  SA (KLOR-CON  M) 20 MEQ tablet Take 1 tablet (20 mEq total) by mouth daily. 90 tablet 1   No current facility-administered medications for this visit.    PHYSICAL EXAMINATION: ECOG PERFORMANCE STATUS: 1 - Symptomatic but completely ambulatory  . Vitals:   08/05/24 1158  BP: 117/77  Pulse: 98  Resp: 18  Temp: 97.9 F (36.6 C)  SpO2: 97%    Filed Weights   08/05/24 1158  Weight: 149 lb 3.2 oz (67.7 kg)   .Body mass index is 30.13 kg/m.  GENERAL:alert, in no acute distress and comfortable SKIN: no acute rashes, no significant lesions EYES: conjunctiva are pink and non-injected, sclera anicteric OROPHARYNX: MMM, no exudates, no oropharyngeal erythema or ulceration NECK: supple, no JVD LYMPH:  no palpable lymphadenopathy in the cervical, axillary or inguinal regions LUNGS: clear to auscultation b/l with normal respiratory effort HEART: regular rate & rhythm ABDOMEN:  normoactive bowel sounds , non tender, not distended. Extremity: no pedal edema PSYCH: alert & oriented x 3 with fluent speech NEURO: no focal motor/sensory deficits  LABORATORY DATA:   I have reviewed the data as listed     Latest Ref Rng & Units 08/05/2024   11:37 AM 06/22/2024    9:14 AM 04/22/2024    9:38 AM  CBC  WBC 4.0 - 10.5 K/uL 7.9  6.7  8.1   Hemoglobin 12.0 - 15.0 g/dL 86.7  86.9  87.7   Hematocrit 36.0 - 46.0 % 40.4  40.2  38.6   Platelets 150 - 400 K/uL 270  240  255    LDH     98 - 192 U/L 06/22/2024 08/05/2024   180  182       Latest Ref Rng & Units 08/05/2024   11:37 AM 06/22/2024    9:14 AM 04/22/2024    9:38 AM  CMP  Glucose 70 - 99 mg/dL 92  92  90   BUN 8 - 23 mg/dL 18  22  15    Creatinine 0.44 - 1.00 mg/dL 8.51  8.41  8.69   Sodium 135 - 145 mmol/L 143  142  142   Potassium 3.5 - 5.1 mmol/L 3.7  3.5  3.5  Chloride 98 - 111 mmol/L 103  103  102   CO2 22 - 32 mmol/L 30  32  30   Calcium 8.9 - 10.3 mg/dL 89.8  9.7  9.9   Total  Protein 6.5 - 8.1 g/dL 7.2  6.9  6.6   Total Bilirubin 0.0 - 1.2 mg/dL 0.7  0.7  0.6   Alkaline Phos 38 - 126 U/L 51  51    AST 15 - 41 U/L 17  19  16    ALT 0 - 44 U/L 11  12  10       RADIOGRAPHIC STUDIES: I have personally reviewed the radiological images as listed and agreed with the findings in the report. CUP PACEART INCLINIC DEVICE CHECK Result Date: 07/16/2024 Normal in-clinic _dual__ chamber pacemaker check. Presenting Rhythm: _AS/VP__ . Routine testing of thresholds, sensing, and impedance demonstrate stable parameters and no programming changes needed at this time. No episodes. Estimated longevity __8.1 years__ . Pt enrolled in remote follow-up. Normal in-clinic _dual__ chamber pacemaker check. Presenting Rhythm: _AS/VP__ . Routine testing of thresholds, sensing, and impedance demonstrate stable parameters and no programming changes needed at this time. No episodes. Estimated longevity __8.1 years__ . Pt enrolled in remote follow-up. SCAF, burden <0.1%  CUP PACEART REMOTE DEVICE CHECK Result Date: 07/15/2024 Pacemaker: Scheduled remote reviewed. Normal device function.  Presenting rhythm: AP/VP Next remote 91 days. ML, CVRS   ASSESSMENT & PLAN:  88 y.o. female with   Stage IE low-grade follicular lymphoma  Presented as a nodule over the left cheek likely in a minor salivary gland.  Status post excision 07/31/2023. Now with a recurrent nodule in the same area. Seen by Dr. Ida Loader 9/19 for evaluation and biopsy - results pending     PLAN: - Discussed lab results from today, 08/05/2024: CBC is within normal limits, CMP shows stable CKD with Creatinine 1.48 << 1.58, and LDH has slightly increased from 180 >> 182. - Evaluated and examined the patient's left cheek since biopsy, which was still mildly swollen. - For residual swelling and soreness, recommended icing the area for relief - Awaiting biopsy results, will contact patient with once determined via pathology and will  subsequently determine next steps in treatment.  FOLLOW-UP: Follow up with Dr. Loader per appointment already scheduled for 08/20/2024. Return to clinic with Dr. Onesimo with labs in 4 months   The total time spent in the appointment was 30 minutes*.  All of the patient's questions were answered with apparent satisfaction. The patient knows to call the clinic with any problems, questions or concerns.   Emaline Onesimo MD MS AAHIVMS [facility] Hematology/Oncology Physician [facility]  .*Total Encounter Time as defined by the Centers for Medicare and Medicaid Services includes, in addition to the face-to-face time of a patient visit (documented in the note above) non-face-to-face time: obtaining and reviewing outside history, ordering and reviewing medications, tests or procedures, care coordination (communications with other health care professionals or caregivers) and documentation in the medical record.   I,Emily Lagle,acting as a Neurosurgeon for Emaline Onesimo, MD.,have documented all relevant documentation on the behalf of Emaline Onesimo, MD,as directed by  Emaline Onesimo, MD while in the presence of Emaline Onesimo, MD.  .I have reviewed the above documentation for accuracy and completeness, and I agree with the above. .Konner Saiz Kishore Marciana Uplinger MD  Addendum    important suggestion  Newer results are available. Click to view them now.      Component Ref Range & Units (hover)  12 d ago  SURGICAL PATHOLOGY SURGICAL PATHOLOGY [facility] [address] [place], [place] [address] Telephone [phone] or [phone] Fax [phone]  REPORT OF SURGICAL PATHOLOGY   Accession #: [id number] Patient Name: Kylie Patrick, Kylie Patrick Visit # :  MRN: [id number] Physician: Jesus Oliphant DOB/Age Sep 25, 1936 (Age: 29) Gender: F Collected Date: 07/31/2024 Received Date: 07/31/2024  FINAL DIAGNOSIS       1. Soft tissue, biopsy, left buccal mass :      -  FOLLICULAR LYMPHOMA,  SEE NOTE.      NOTE: THE SPECIMEN CONSISTS OF NODULAR LYMPHOID TISSUE PERCOLATING THROUGH      FIBROUS SOFT TISSUE, ADIPOSE TISSUE AND SKELETAL MUSCLE.  A DEFINITIVE LYMPH      NODE IS NOT IDENTIFIED.  THE CELLS OF INTEREST ARE CD20 POSITIVE B CELLS THAT      EXPRESS THE FOLLICLE/GERMINAL CENTER MARKERS CD10/BCL6 WITH ABERRANT STRONG      EXPRESSION OF BCL-2.  THERE IS OVERALL A RETAINED BUT HIGHLY DISRUPTED CD23      POSITIVE FOLLICULAR DENDRITIC CELL [place].  THE B CELLS OF CELLS ARE ALSO      POSITIVE FOR CD23.  CD3/CD5/CD43 HIGHLIGHT BACKGROUND SMALL T CELLS.  CYCLIN D1      IS APPROPRIATELY NEGATIVE.  THE PROLIFERATION RATE FOCALLY HAS AREAS APPROACHING      50% BUT ON AVERAGE IS BETWEEN 15 AND 20% OVERALL.  MORPHOLOGICALLY THE MAJORITY      OF THE CELLS ARE SMALL AND MATURE IN APPEARANCE WITH A CLEAVED NUCLEAR MEMBRANE.      CENTROBLASTS, IN GENERAL, ARE NOT GREATER THAN 15 (AVERAGE APPROXIMATELY 10/HIGH      POWER FIELD ); HOWEVER, THERE APPEARS TO BE A HIGH APOPTOTIC/MITOTIC INDEX.  IN      ADDITION AS PREVIOUSLY MENTIONED THE PROLIFERATION RATE IS FOCALLY HIGH WHICH IS      SLIGHTLY DISCORDANT.  OVERALL, FINDINGS ARE CONSISTENT WITH A FOLLICULAR      LYMPHOMA, GRADE 2 OF 3; HOWEVER, SAMPLING AND SLIGHT KI-67 DISCORDANCE CANNOT      EXCLUDE A HIGHER GRADE LESI     -Visible lesion has been removed for low-grade follicular lymphoma. - Initial PET CT scan in October 2024 showed no other evidence of disease. - No indication for additional treatment at this time. - If there is recurrent local lesion might need to consider involved field radiation therapy.  Patient wants to hold off at this time. -

## 2024-08-06 ENCOUNTER — Ambulatory Visit: Admitting: Family Medicine

## 2024-08-06 ENCOUNTER — Ambulatory Visit (INDEPENDENT_AMBULATORY_CARE_PROVIDER_SITE_OTHER)

## 2024-08-06 VITALS — BP 128/84 | HR 88 | Ht 59.0 in

## 2024-08-06 DIAGNOSIS — M47816 Spondylosis without myelopathy or radiculopathy, lumbar region: Secondary | ICD-10-CM | POA: Diagnosis not present

## 2024-08-06 DIAGNOSIS — M5442 Lumbago with sciatica, left side: Secondary | ICD-10-CM

## 2024-08-06 DIAGNOSIS — G8929 Other chronic pain: Secondary | ICD-10-CM

## 2024-08-06 DIAGNOSIS — M5441 Lumbago with sciatica, right side: Secondary | ICD-10-CM

## 2024-08-06 MED ORDER — PREDNISONE 50 MG PO TABS: ORAL_TABLET | ORAL | 0 refills | Status: DC

## 2024-08-06 NOTE — Progress Notes (Signed)
   LILLETTE Ileana Collet, PhD, LAT, ATC acting as a scribe for Artist Lloyd, MD.  Kylie Patrick is a 88 y.o. female who presents to [facility] today for LBP. Pt was previously seen by Dr. Lloyd in 2024 for CTS.  Today, pt c/o LBP x 4 months. Pt locates pain to deep in the buttock, bilaterally. Some time the pain is a burning pain. No falls  Radiating pain: yes-bilat posterior thigh LE numbness/tingling: no LE weakness: yes Aggravates: walking Treatments tried: Tylenol   Dx testing: 10/03/22 DEXA  Pertinent review of systems: No fevers or chills  Relevant historical information: Follicular lymphoma   Exam:  BP 128/84   Pulse 88   Ht 4' 11 (1.499 m)   SpO2 98%   BMI 30.13 kg/m  General: Well Developed, well nourished, and in no acute distress.   MSK: L-spine nontender palpation midline decreased unemotional extremity strength is intact.    Lab and Radiology Results  X-ray images lumbar spine obtained today personally and independently interpreted. Degenerative scoliosis.  No acute fractures.  Not significant change from x-ray lumbar spine 2021 Await formal radiology review     Assessment and Plan: 88 y.o. female with worsening chronic low back pain with some radiation into the buttocks and thighs concerning for spinal stenosis.  Fortunately lumbar spine x-ray does not show acute fracture per my interpretation today.  Radiology over read is still pending.  Plan for MRI lumbar spine.  She does have a pacemaker that she says is MRI conditional.  She does not have the pacemaker card with her today but should be able to provide it to radiology scheduling.  If MRI is not feasible backup plan would be CT myelogram lumbar spine.  Prednisone  course prescribed today.   PDMP not reviewed this encounter. Orders Placed This Encounter  Procedures   DG Lumbar Spine 2-3 Views    Standing Status:   Future    Number of Occurrences:   1    Expiration  Date:   09/05/2024    Reason for Exam (SYMPTOM  OR DIAGNOSIS REQUIRED):   low back pain    Preferred imaging location?:   [facility]   MR Lumbar Spine Wo Contrast    Standing Status:   Future    Expiration Date:   08/06/2025    What is the patient's sedation requirement?:   No Sedation    Does the patient have a pacemaker or implanted devices?:   Yes    Preferred imaging location?:   [facility] (table limit - 500lbs)   Meds ordered this encounter  Medications   predniSONE  (DELTASONE ) 50 MG tablet    Sig: Take 1 pill daily for 5 days    Dispense:  5 tablet    Refill:  0     Discussed warning signs or symptoms. Please see discharge instructions. Patient expresses understanding.   The above documentation has been reviewed and is accurate and complete Artist Lloyd, M.D.

## 2024-08-06 NOTE — Patient Instructions (Addendum)
 Thank you for coming in today.   You should hear from MRI scheduling within 1 week. If you do not hear please let me know.    Please get an Xray today before you leave  I've sent a prescription for Prednisone  to your pharmacy.

## 2024-08-10 ENCOUNTER — Ambulatory Visit: Payer: Self-pay

## 2024-08-10 ENCOUNTER — Telehealth: Payer: Self-pay | Admitting: Physician Assistant

## 2024-08-10 MED ORDER — CARVEDILOL 6.25 MG PO TABS: 6.2500 mg | ORAL_TABLET | Freq: Two times a day (BID) | ORAL | 3 refills | Status: AC

## 2024-08-10 MED ORDER — CARVEDILOL 6.25 MG PO TABS: 6.2500 mg | ORAL_TABLET | Freq: Two times a day (BID) | ORAL | 3 refills | Status: DC

## 2024-08-10 NOTE — Telephone Encounter (Signed)
 Pt c/o medication issue:  1. Name of Medication:   carvedilol  (COREG ) 6.25 MG tablet    2. How are you currently taking this medication (dosage and times per day)? Take 1 tablet (6.25 mg total) by mouth 2 (two) times daily with a meal.   3. Are you having a reaction (difficulty breathing--STAT)? No  4. What is your medication issue? Pt is calling because her new Rx does not match the dosage switch they discussed at her last appt. Please advise.     *STAT* If patient is at the pharmacy, call can be transferred to refill team.   1. Which medications need to be refilled? (please list name of each medication and dose if known)   carvedilol  (COREG ) 6.25 MG tablet     2. Would you like to learn more about the convenience, safety, & potential cost savings by using the [facility]? No   3. Are you open to using the [facility] (Type [facility]. No   4. Which pharmacy/location (including street and city if local pharmacy) is medication to be sent to? [id number] [place], [place] - [address]     5. Do they need a 30 day or 90 day supply? 90 day    Pt is out of medication

## 2024-08-10 NOTE — Addendum Note (Signed)
 Addended by: DARIO IZETTA CROME on: 08/10/2024 03:59 PM   Modules accepted: Orders

## 2024-08-10 NOTE — Telephone Encounter (Signed)
 RX sent in

## 2024-08-10 NOTE — Telephone Encounter (Signed)
*  STAT* If patient is at the pharmacy, call can be transferred to refill team.   1. Which medications need to be refilled? (please list name of each medication and dose if known) carvedilol  (COREG ) 6.25 MG tablet   4. Which pharmacy/location (including street and city if local pharmacy) is medication to be sent to?  [id number] [place], [place] - [address] [facility] [address] Phone: [phone]  Fax: [phone]       5. Do they need a 30 day or 90 day supply? 90    Pt completely out

## 2024-08-10 NOTE — Telephone Encounter (Signed)
 Please see triage note. Cardiologist sending bp meds to pharmacy today.

## 2024-08-10 NOTE — Telephone Encounter (Signed)
 FYI Only or Action Required?: FYI only for provider.  Patient was last seen in primary care on 07/15/2024 by Katrinka Garnette KIDD, MD.  Called Nurse Triage reporting Hypertension (Ran out of meds).  Symptoms began several days ago.  Interventions attempted: Other: Called Cardiology office to refill Coreg .  Symptoms are: stable.  Triage Disposition: Call PCP Within 24 Hours  Patient/caregiver understands and will follow disposition?: Yes  Copied from CRM #[id number]. Topic: Clinical - Red Word Triage >> Aug 10, 2024 10:09 AM Maisie C wrote: Red Word that prompted transfer to Nurse Triage: misread instructions for her bp medication and now her bp is elevated.  Reason for Disposition  Ran out of BP medications    Ran out of meds, issue with prescription not being sent. Cardiology office notified, rx being sent to preferred pharmacy today.  Answer Assessment - Initial Assessment Questions Ran out of Coreg  1 week ago. Attempted to have it refilled but rx changed from 1.5 tabs to 1 tab bid d/t low BP during last cardiology appt on 9/4. Rx pended by cardiology, but not sent, instructions to refill once pt called. Called cardiology office, sending over rx to pts preferred pharmacy today.  1. BLOOD PRESSURE: What is your blood pressure? Did you take at least two measurements 5 minutes apart?     Pt check BP this morning, 1st reading 158/84, 5 minutes later 118/96. Attempted to obtain repeat  BP with automatic cuff with pt on the phone. BP cuff failed x3 attempts, 4th attempt 154/87. Pt reports never having this problem with BP cuff before. Cuff has new batteries. Encouraged pt to obtain new BP cuff if it continues to have failed readings. 2. ONSET: When did you take your blood pressure?     This morning and during call. 3. HOW: How did you take your blood pressure? (e.g., automatic home BP monitor, visiting nurse)     Automatic cuff 4. HISTORY: Do you have a history of high blood  pressure?     Yes 5. MEDICINES: Are you taking any medicines for blood pressure? Have you missed any doses recently?     Coreg , losartan , lasix . Has been out of Coreg  x1 week. 6. OTHER SYMPTOMS: Do you have any symptoms? (e.g., blurred vision, chest pain, difficulty breathing, headache, weakness)     No  Protocols used: Blood Pressure - High-A-AH

## 2024-08-10 NOTE — Telephone Encounter (Signed)
 Former Pt of Dr. Fernande. Last seen by Jenna Arthur PA. Please advise on this RX question.

## 2024-08-13 ENCOUNTER — Ambulatory Visit: Payer: Self-pay | Admitting: Family Medicine

## 2024-08-13 DIAGNOSIS — G8929 Other chronic pain: Secondary | ICD-10-CM

## 2024-08-13 DIAGNOSIS — M48061 Spinal stenosis, lumbar region without neurogenic claudication: Secondary | ICD-10-CM

## 2024-08-13 NOTE — Progress Notes (Signed)
 Lumbar spine x-ray shows advanced arthritis.

## 2024-08-17 ENCOUNTER — Telehealth: Payer: Self-pay | Admitting: Family Medicine

## 2024-08-17 ENCOUNTER — Telehealth: Payer: Self-pay | Admitting: Hematology

## 2024-08-17 NOTE — Telephone Encounter (Signed)
 I called Ms. Kylie Patrick in follow-up from her recent visit with the medical oncology office.  Her pathology results are now available and show recurrent follicular lymphoma in the area of the left buccal mass. Appears to be a low-grade grade 2 out of 3 with some areas of Ki-67 up to 50% on average is between 15 and 20%.  No large cell transformation.  Discussed results in details. Patient is not keen to have radiation therapy to this area and from my perspective I think it is okay to wait. If the patient has local recurrence in the future palliative involved site radiation therapy might be reasonable. If the patient has more broad progression might consider rituximab monotherapy. At this point we will see her back in 4 months with repeat labs. Patient is agreeable with this plan.  Kylie Saran MD MS Hematology/Oncology Physician [facility]

## 2024-08-17 NOTE — Telephone Encounter (Signed)
 Spoke with patient and Dr. Katrinka. Please schedule her for 4pm on Thursday 08/20/2024. Patient is aware and verbalized understanding. I cannot schedule in that time because it is held for same day.   Copied from CRM [id number]. Topic: Clinical - Prescription Issue >> Aug 17, 2024  8:04 AM Aleatha C wrote: Reason for CRM: Patient blood pressure medicine was changed and patient has been having problems with it since and like a call back to get it straighten out from Dr Katrinka

## 2024-08-20 ENCOUNTER — Ambulatory Visit: Admitting: Family Medicine

## 2024-08-20 ENCOUNTER — Encounter: Payer: Self-pay | Admitting: Family Medicine

## 2024-08-20 VITALS — BP 110/64 | HR 91 | Temp 97.5°F | Ht 59.0 in | Wt 150.6 lb

## 2024-08-20 DIAGNOSIS — C8291 Follicular lymphoma, unspecified, lymph nodes of head, face, and neck: Secondary | ICD-10-CM

## 2024-08-20 DIAGNOSIS — C8290 Follicular lymphoma, unspecified, unspecified site: Secondary | ICD-10-CM | POA: Insufficient documentation

## 2024-08-20 DIAGNOSIS — I10 Essential (primary) hypertension: Secondary | ICD-10-CM | POA: Diagnosis not present

## 2024-08-20 NOTE — Progress Notes (Signed)
 Phone [phone] In person visit   Subjective:   Kylie Patrick is a 88 y.o. year old very pleasant female patient who presents for/with See problem oriented charting Chief Complaint  Patient presents with   Blood Pressure Issues    Has been fluctuating all week and she hasn't been feeling good;    Past Medical History-  Patient Active Problem List   Diagnosis Date Noted   Follicular lymphoma (HCC) 08/20/2024    Priority: High   Atrial fibrillation (HCC) -SCAF 08/14/2022    Priority: High   Pacemaker - MDT 08/11/2020    Priority: High   Complete heart block (HCC) 04/05/2020    Priority: High   Neuropathy 10/09/2023    Priority: Medium    Aortic atherosclerosis 04/26/2020    Priority: Medium    Osteoporosis 04/25/2020    Priority: Medium    Carpal tunnel syndrome, right 05/02/2019    Priority: Medium    Diastolic dysfunction 07/12/2016    Priority: Medium    Hyperglycemia 10/09/2014    Priority: Medium    GERD (gastroesophageal reflux disease) 10/22/2013    Priority: Medium    CKD (chronic kidney disease) stage 3, GFR 30-59 ml/min (HCC) 05/05/2008    Priority: Medium    Anxiety state 04/20/2008    Priority: Medium    HYPERLIPIDEMIA 08/12/2007    Priority: Medium    Essential hypertension 08/12/2007    Priority: Medium    Rash 07/04/2017    Priority: Low   Hiatal hernia 08/03/2013    Priority: Low   History of colonic polyps 01/09/2013    Priority: Low   NIGHT SWEATS 02/19/2008    Priority: Low   Exercise intolerance 08/14/2022    Medications- reviewed and updated Current Outpatient Medications  Medication Sig Dispense Refill   ALPRAZolam  (XANAX ) 0.25 MG tablet Take 1 tablet (0.25 mg total) by mouth 2 (two) times daily as needed for anxiety (Do not drive for 8 hours after taking.  If any falls on medicine stop immediately and let us  know). 30 tablet 0   Ascorbic Acid (VITAMIN C PO) Take 1 tablet by mouth daily.     Calcium in Bone Mineral Cmplx 350 MG  MISC Take 1 each by mouth in the morning, at noon, and at bedtime.     carvedilol  (COREG ) 6.25 MG tablet Take 1 tablet (6.25 mg total) by mouth 2 (two) times daily with a meal. 180 tablet 3   Cholecalciferol (VITAMIN D3) 2000 UNITS TABS Take 1 tablet by mouth daily with lunch.     Cyanocobalamin  (VITAMIN B-12 PO) Take 1 capsule by mouth daily at 2 am.     esomeprazole  (NEXIUM ) 40 MG capsule TAKE 1 CAPSULE BY MOUTH TWICE A DAY BEFORE A MEAL 180 capsule 3   ezetimibe -simvastatin  (VYTORIN ) 10-20 MG tablet TAKE 1 TABLET BY MOUTH EVERYDAY AT BEDTIME 90 tablet 2   famotidine  (PEPCID ) 40 MG tablet TAKE 1 TABLET BY MOUTH EVERYDAY AT BEDTIME 90 tablet 3   furosemide  (LASIX ) 40 MG tablet TAKE 1 TABLET BY MOUTH EVERY DAY 90 tablet 3   gabapentin  (NEURONTIN ) 100 MG capsule Take 1 pill in the morning and 2 before bed 270 capsule 3   losartan  (COZAAR ) 25 MG tablet Take 0.5 tablets (12.5 mg total) by mouth daily. 45 tablet 3   Magnesium  400 MG CAPS Take 400 mg by mouth at bedtime.     Omega-3 Fatty Acids (FISH OIL) 500 MG CAPS Take 500 mg by mouth daily with lunch.  potassium chloride  SA (KLOR-CON  M) 20 MEQ tablet Take 1 tablet (20 mEq total) by mouth daily. 90 tablet 1   predniSONE  (DELTASONE ) 50 MG tablet Take 1 pill daily for 5 days 5 tablet 0   No current facility-administered medications for this visit.     Objective:  BP 110/64   Pulse 91   Temp (!) 97.5 F (36.4 C) (Temporal)   Ht 4' 11 (1.499 m)   Wt 150 lb 9.6 oz (68.3 kg)   SpO2 98%   BMI 30.42 kg/m  Gen: NAD, resting comfortably CV: RRR no murmurs rubs or gallops Lungs: CTAB no crackles, wheeze, rhonchi Ext: Minimal edema Skin: warm, dry     Assessment and Plan   # Follicular lymphoma-followed by Dr. Waunita removed and PET/CT reassuring-option of radiation  given but she is also given the option of recheck every 4 to 6 months.  She is really undecided on next steps at this point-we had a prolonged discussion about this  today.  She intends to pray on her options with her family and I answered questions to the best of my ability.  We also discussed possible second opinion just to see if they would have the same recommendation on monitoring  #hypertension S: medication:  Carvedilol  9.375 mg BID recently reduced to 6.25 mg twice daily (due to reduced dose she ended up not being able to get a refill when she needed it she reports), Lasix  40mg  daily, losartan  12.5mg  (reduced by Dr. Prescilla from 100mg  originally to 25 mg) Home readings #s:blood pressure went as high as 150 when out of medicine BP Readings from Last 3 Encounters:  08/20/24 110/64  08/06/24 128/84  08/05/24 117/77  A/P:blood pressure reasonably controlled and feels well at 110/64 today. The reductoin in carvedilol  was helpful it appears. Continue losartan  12.5 mg and lasix  40 mg daily as well.   -Did give her the option If pressure below 110 /feeling lightheaded with this- can try just half a tablet of the furosemide  40 mg that day. If having to take the half dose a lot let me know  Recommended follow up: Return for next already scheduled visit or sooner if needed. Future Appointments  Date Time Provider Department Center  09/02/2024  1:00 PM [facility] 1 [facility]  10/01/2024 10:40 AM [facility] ANNUAL WELLNESS VISIT 1 [facility]  10/12/2024  7:10 AM [facility] DEVICE REMOTES [facility]  10/22/2024 10:20 AM Katrinka Garnette KIDD, MD [facility]  01/11/2025  7:00 AM [facility] DEVICE REMOTES [facility]  04/12/2025  7:00 AM [facility] DEVICE REMOTES [facility]  07/12/2025  7:00 AM [facility] DEVICE REMOTES [facility]  10/11/2025  7:00 AM [facility] DEVICE REMOTES [facility]  01/10/2026  7:00 AM [facility] DEVICE REMOTES [facility]  04/12/2026  7:00 AM [facility] DEVICE REMOTES [facility]    Lab/Order associations:   ICD-10-CM   1. Essential hypertension  I10     2. Follicular lymphoma of lymph nodes of head, unspecified follicular  lymphoma type (HCC)  C82.91       No orders of the defined types were placed in this encounter.   Return precautions advised.  Garnette Katrinka, MD

## 2024-08-20 NOTE — Patient Instructions (Addendum)
 If pressure below 110 /feeling lightheaded with this- can try just half a tablet of the furosemide  40 mg that day. If having to take the half dose a lot let me know  Recommended follow up: Return for next already scheduled visit or sooner if needed.

## 2024-08-27 NOTE — CV Procedure (Signed)
  Device system confirmed to be MRI conditional, with implant date > 6 weeks ago, and no evidence of abandoned or epicardial leads in review of most recent CXR  Device last cleared by EP Provider: Charlies Arthur 08/27/24  Clearance is good through for 1 year as long as parameters remain stable at time of check. If pt undergoes a cardiac device procedure during that time, they should be re-cleared.   Tachy-therapies to be programmed off if applicable with device back to pre-MRI settings after completion of exam.  [employer] - Programming recommendation received through [employer] App/Tablet  Rocky Catalan, RT  08/27/2024 9:19 AM

## 2024-09-02 ENCOUNTER — Ambulatory Visit (HOSPITAL_COMMUNITY)
Admission: RE | Admit: 2024-09-02 | Discharge: 2024-09-02 | Disposition: A | Discharge status: Home / Self Care | Source: Ambulatory Visit | Attending: Family Medicine | Admitting: Family Medicine

## 2024-09-02 DIAGNOSIS — M5442 Lumbago with sciatica, left side: Secondary | ICD-10-CM | POA: Diagnosis not present

## 2024-09-02 DIAGNOSIS — M47816 Spondylosis without myelopathy or radiculopathy, lumbar region: Secondary | ICD-10-CM

## 2024-09-02 DIAGNOSIS — M5441 Lumbago with sciatica, right side: Secondary | ICD-10-CM | POA: Diagnosis not present

## 2024-09-02 DIAGNOSIS — M48061 Spinal stenosis, lumbar region without neurogenic claudication: Secondary | ICD-10-CM

## 2024-09-02 DIAGNOSIS — G8929 Other chronic pain: Secondary | ICD-10-CM | POA: Diagnosis not present

## 2024-09-02 NOTE — Progress Notes (Signed)
 Patient was monitored by this RN during MRI scan due to presence of a pacemaker. Cardiac rhythm was continuously monitored throughout the procedure. Prior to the start of the scan, the pacemaker was placed in MRI-safe mode by the MRI technician. Following the completion of the scan, the device was returned to its pre-MRI settings. Neurological status and orientation post-procedure were unchanged from baseline.   Pre-procedure Heart Rate (Prior to being placed in MRI safe mode): 60 Intra-MRI HR: 100 Post-procedure Heart Rate (Once pacemaker is returned to baseline mode):  61

## 2024-09-04 DIAGNOSIS — M48061 Spinal stenosis, lumbar region without neurogenic claudication: Secondary | ICD-10-CM | POA: Insufficient documentation

## 2024-09-04 NOTE — Progress Notes (Signed)
 MRI lumbar spine shows severe spinal stenosis which is causing your pain.  We are going to try a back injection.  I have placed an order.  You should hear soon from the imaging location about getting the back injection set up.

## 2024-09-07 ENCOUNTER — Telehealth: Payer: Self-pay | Admitting: Family Medicine

## 2024-09-07 NOTE — Telephone Encounter (Signed)
 Please see patient note and advise so I can return her call. Thanks!    Copied from CRM [id number]. Topic: Clinical - Medical Advice >> Sep 07, 2024  1:13 PM Eva FALCON wrote: Reason for CRM: Had to have MRI and needs to find out his opinion about an injection in her spine and wants his opinion on that injection as she does not feel well about getting the injection. Is requesting a call back from Dr. San Nurse.

## 2024-09-07 NOTE — Telephone Encounter (Signed)
 I have a lot of trust in Dr. Joane and his decision making.  I had several patients do these injections-I cannot promise that every single patient has significant relief but many patients do and I think it would be reasonable to proceed forward with how you are feeling

## 2024-09-08 NOTE — Telephone Encounter (Signed)
 Spoke with patient and relayed Dr. San message and proceeding with Dr. Virgilio Treatment plan.

## 2024-09-16 ENCOUNTER — Telehealth: Payer: Self-pay | Admitting: Family Medicine

## 2024-09-16 NOTE — Telephone Encounter (Signed)
 Forwarding to Dr. Denyse Amass.

## 2024-09-16 NOTE — Telephone Encounter (Signed)
 Patient stated she has so many questions about the injection she has been referred to get. She would like to talk to Dr. Joane specifically about it when he has some time. She is already scheduled. Please advise.

## 2024-09-17 NOTE — Telephone Encounter (Signed)
 It sounds like we may need to have an office visit.  If we cannot get her in before her injection we may need to delay the injection if she has lots of questions to ask.

## 2024-09-17 NOTE — Telephone Encounter (Signed)
 Called and talked about the procedure w/ pt. She just been sitting at home and worrying herself. Answered questions, explained who does the procedure, risks, and that the radiologist would talk to her and answer any additional questions prior to the injection. She felt much better and expressed great appreciation for me calling.

## 2024-09-23 NOTE — Discharge Instructions (Signed)

## 2024-09-25 ENCOUNTER — Ambulatory Visit
Admission: RE | Admit: 2024-09-25 | Discharge: 2024-09-25 | Disposition: A | Discharge status: Home / Self Care | Source: Ambulatory Visit | Attending: Family Medicine | Admitting: Family Medicine

## 2024-09-25 DIAGNOSIS — G8929 Other chronic pain: Secondary | ICD-10-CM

## 2024-09-25 DIAGNOSIS — M47817 Spondylosis without myelopathy or radiculopathy, lumbosacral region: Secondary | ICD-10-CM | POA: Diagnosis not present

## 2024-09-25 DIAGNOSIS — M48061 Spinal stenosis, lumbar region without neurogenic claudication: Secondary | ICD-10-CM | POA: Diagnosis not present

## 2024-09-25 DIAGNOSIS — M5416 Radiculopathy, lumbar region: Secondary | ICD-10-CM | POA: Diagnosis not present

## 2024-09-25 MED ORDER — METHYLPREDNISOLONE ACETATE 40 MG/ML INJ SUSP (RADIOLOG
80.0000 mg | Freq: Once | INTRAMUSCULAR | Status: AC
  Administered 2024-09-25: 80 mg via EPIDURAL

## 2024-09-25 MED ORDER — IOPAMIDOL (ISOVUE-M 200) INJECTION 41%
1.0000 mL | Freq: Once | INTRAMUSCULAR | Status: AC
  Administered 2024-09-25: 1 mL via EPIDURAL

## 2024-09-29 ENCOUNTER — Ambulatory Visit

## 2024-09-29 VITALS — BP 148/88 | Ht 59.0 in | Wt 150.0 lb

## 2024-09-29 DIAGNOSIS — Z0000 Encounter for general adult medical examination without abnormal findings: Secondary | ICD-10-CM

## 2024-09-29 NOTE — Progress Notes (Signed)
 Chief Complaint  Patient presents with   Medicare Wellness     Subjective:   Kylie Patrick is a 88 y.o. female who presents for a Medicare Annual Wellness Visit.  Allergies (verified) Chlorthalidone , Hydrochlorothiazide, Losartan  potassium-hctz, Norvasc [amlodipine besylate], Raloxifene, and Rofecoxib   History: Past Medical History:  Diagnosis Date   Anxiety    Colon polyp 2003   Complication of anesthesia    slow to wake up one time   Gastroenteritis    w/ renal insufficiency in context of protracted nausea & vomitting 2006   GERD (gastroesophageal reflux disease)    H/O hiatal hernia    Headache(784.0)    occasional lifelong- gabapentin  helps a   Hiatal hernia    Hyperlipidemia    Hypertension    w/ LVH on ECHO   Pacemaker 07/2016   Tonsillitis 2006   Past Surgical History:  Procedure Laterality Date   ABDOMINAL HYSTERECTOMY  1976   BSO for endometriosis and bengin tumor   APPENDECTOMY  1970   BREAST LUMPECTOMY Left 70's   X 2   BUNIONECTOMY Bilateral    CATARACT EXTRACTION W/PHACO Left 05/14/2013   Procedure: CATARACT EXTRACTION PHACO AND INTRAOCULAR LENS PLACEMENT (IOC);  Surgeon: Cherene Mania, MD;  Location: [facility];  Service: Ophthalmology;  Laterality: Left;  CDE:9.76   CATARACT EXTRACTION W/PHACO Right 06/08/2013   Procedure: CATARACT EXTRACTION PHACO AND INTRAOCULAR LENS PLACEMENT (IOC);  Surgeon: Cherene Mania, MD;  Location: [facility];  Service: Ophthalmology;  Laterality: Right;  CDE 13.65   CHOLECYSTECTOMY  1970   COLONOSCOPY W/ POLYPECTOMY  2003   Dr Avram   EP IMPLANTABLE DEVICE N/A 07/18/2016   Procedure: Pacemaker Implant;  Surgeon: Elspeth JAYSON Sage, MD;  Location: [facility] INVASIVE CV LAB;  Service: Cardiovascular;  Laterality: N/A;   ESOPHAGEAL MANOMETRY N/A 10/05/2013   Procedure: ESOPHAGEAL MANOMETRY (EM);  Surgeon: Lupita FORBES Avram, MD;  Location: [facility];  Service: Endoscopy;  Laterality: N/A;   ESOPHAGOGASTRODUODENOSCOPY  02/05/13   Large Hiatal Hernia    HERNIA REPAIR     LAPAROSCOPIC NISSEN FUNDOPLICATION Bilateral 10/22/2013   Procedure: LAPAROSCOPIC NISSEN FUNDOPLICATION with hiatal hernia repair ;  Surgeon: Morene ONEIDA Olives, MD;  Location: [facility];  Service: General;  Laterality: Bilateral;   PPM GENERATOR CHANGEOUT N/A 04/07/2024   Procedure: PPM GENERATOR CHANGEOUT;  Surgeon: Cindie Ole ONEIDA, MD;  Location: [facility] INVAS[facility] CV LAB;  Service: Cardiovascular;  Laterality: N/A;   Family History  Problem Relation Age of Onset   Heart attack Father 11       fatal MI @ 38   Coronary artery disease Mother        CABG; MI @ 87   Diabetes Mother    Stroke Paternal Grandfather        > 66   Stroke Maternal Grandmother        in 73s   Aneurysm Paternal Grandmother        cns ; in 31s   Diabetes Maternal Aunt    Stomach cancer Maternal Aunt    Breast cancer Maternal Aunt    Heart disease Sister    Coronary artery disease Maternal Grandfather    Colon cancer Neg Hx    Esophageal cancer Neg Hx    Social History   Occupational History   Occupation: Retired    Associate Professor: RETIRED  Tobacco Use   Smoking status: Never   Smokeless tobacco: Never  Vaping Use   Vaping status: Never Used  Substance and Sexual  Activity   Alcohol use: Yes    Comment: Wine-VERY RARELY   Drug use: No   Sexual activity: Not Currently   Tobacco Counseling Counseling given: Not Answered  SDOH Screenings   Food Insecurity: No Food Insecurity (09/29/2024)  Housing: Unknown (09/29/2024)  Transportation Needs: No Transportation Needs (09/29/2024)  Utilities: Not At Risk (09/29/2024)  Depression (PHQ2-9): Low Risk  (09/29/2024)  Financial Resource Strain: Low Risk  (09/25/2023)  Physical Activity: Insufficiently Active (09/29/2024)  Social Connections: Moderately Isolated (09/29/2024)  Stress: No Stress Concern Present (09/29/2024)  Tobacco Use: Low Risk  (09/29/2024)  Health Literacy: Adequate Health Literacy (09/29/2024)   See flowsheets for full  screening details  Depression Screen PHQ 2 & 9 Depression Scale- Over the past 2 weeks, how often have you been bothered by any of the following problems? Little interest or pleasure in doing things: 0 Feeling down, depressed, or hopeless (PHQ Adolescent also includes...irritable): 0 PHQ-2 Total Score: 0 Trouble falling or staying asleep, or sleeping too much: 0 Feeling tired or having little energy: 1 Poor appetite or overeating (PHQ Adolescent also includes...weight loss): 0 Feeling bad about yourself - or that you are a failure or have let yourself or your family down: 0 Trouble concentrating on things, such as reading the newspaper or watching television (PHQ Adolescent also includes...like school work): 0 Moving or speaking so slowly that other people could have noticed. Or the opposite - being so fidgety or restless that you have been moving around a lot more than usual: 0 Thoughts that you would be better off dead, or of hurting yourself in some way: 0 PHQ-9 Total Score: 1 If you checked off any problems, how difficult have these problems made it for you to do your work, take care of things at home, or get along with other people?: Not difficult at all     Goals Addressed               This Visit's Progress     weight loss (pt-stated)        Weight loss        Visit info / Clinical Intake: Medicare Wellness Visit Type:: Subsequent Annual Wellness Visit Persons participating in visit:: patient Medicare Wellness Visit Mode:: Telephone If telephone:: video declined Because this visit was a virtual/telehealth visit:: pt reported vitals If Telephone or Video please confirm:: I connected with the patient using audio enabled telemedicine application and verified that I am speaking with the correct person using two identifiers Patient Location:: home Provider Location:: office Information given by:: patient Interpreter Needed?: No Pre-visit prep was completed: yes AWV  questionnaire completed by patient prior to visit?: no Living arrangements:: (!) lives alone Patient's Overall Health Status Rating: good Typical amount of pain: some Does pain affect daily life?: no Are you currently prescribed opioids?: no  Dietary Habits and Nutritional Risks How many meals a day?: 3 Eats fruit and vegetables daily?: yes Most meals are obtained by: preparing own meals; eating out Diabetic:: no  Functional Status Activities of Daily Living (to include ambulation/medication): Independent Ambulation: Independent with device- listed below Home Assistive Devices/Equipment: Eyeglasses; Cane Medication Administration: Independent Home Management: Independent Manage your own finances?: yes Primary transportation is: driving Concerns about vision?: no *vision screening is required for WTM* Concerns about hearing?: no  Fall Screening Falls in the past year?: 0 Number of falls in past year: 0 Was there an injury with Fall?: 0 Fall Risk Category Calculator: 0 Patient Fall Risk Level: Low Fall  Risk  Fall Risk Patient at Risk for Falls Due to: Impaired balance/gait Fall risk Follow up: Falls prevention discussed  Home and Transportation Safety: All rugs have non-skid backing?: N/A, no rugs All stairs or steps have railings?: (!) no Grab bars in the bathtub or shower?: yes Have non-skid surface in bathtub or shower?: yes Good home lighting?: yes Regular seat belt use?: yes Hospital stays in the last year:: no  Cognitive Assessment Difficulty concentrating, remembering, or making decisions? : no Will 6CIT or Mini Cog be Completed: no 6CIT or Mini Cog Declined: patient alert, oriented, able to answer questions appropriately and recall recent events  Advance Directives (For Healthcare) Does Patient Have a Medical Advance Directive?: Yes Type of Advance Directive: Healthcare Power of Attorney Copy of Healthcare Power of Attorney in Chart?: No - copy  requested  Reviewed/Updated  Reviewed/Updated: Reviewed All (Medical, Surgical, Family, Medications, Allergies, Care Teams, Patient Goals)        Objective:    Today's Vitals   09/29/24 0839  BP: (!) 148/88  Weight: 150 lb (68 kg)  Height: 4' 11 (1.499 m)   Body mass index is 30.3 kg/m.  Current Medications (verified) Outpatient Encounter Medications as of 09/29/2024  Medication Sig   ALPRAZolam  (XANAX ) 0.25 MG tablet Take 1 tablet (0.25 mg total) by mouth 2 (two) times daily as needed for anxiety (Do not drive for 8 hours after taking.  If any falls on medicine stop immediately and let us  know).   Ascorbic Acid (VITAMIN C PO) Take 1 tablet by mouth daily.   Calcium in Bone Mineral Cmplx 350 MG MISC Take 1 each by mouth in the morning, at noon, and at bedtime.   carvedilol  (COREG ) 6.25 MG tablet Take 1 tablet (6.25 mg total) by mouth 2 (two) times daily with a meal.   Cholecalciferol (VITAMIN D3) 2000 UNITS TABS Take 1 tablet by mouth daily with lunch.   Cyanocobalamin  (VITAMIN B-12 PO) Take 1 capsule by mouth daily at 2 am.   esomeprazole  (NEXIUM ) 40 MG capsule TAKE 1 CAPSULE BY MOUTH TWICE A DAY BEFORE A MEAL   ezetimibe -simvastatin  (VYTORIN ) 10-20 MG tablet TAKE 1 TABLET BY MOUTH EVERYDAY AT BEDTIME   famotidine  (PEPCID ) 40 MG tablet TAKE 1 TABLET BY MOUTH EVERYDAY AT BEDTIME   furosemide  (LASIX ) 40 MG tablet TAKE 1 TABLET BY MOUTH EVERY DAY   gabapentin  (NEURONTIN ) 100 MG capsule Take 1 pill in the morning and 2 before bed   losartan  (COZAAR ) 25 MG tablet Take 0.5 tablets (12.5 mg total) by mouth daily.   Magnesium  400 MG CAPS Take 400 mg by mouth at bedtime.   Omega-3 Fatty Acids (FISH OIL) 500 MG CAPS Take 500 mg by mouth daily with lunch.   potassium chloride  SA (KLOR-CON  M) 20 MEQ tablet Take 1 tablet (20 mEq total) by mouth daily.   [DISCONTINUED] predniSONE  (DELTASONE ) 50 MG tablet Take 1 pill daily for 5 days   No facility-administered encounter medications on file  as of 09/29/2024.   Hearing/Vision screen Hearing Screening - Comments:: Pt denies  any hearing issues  Vision Screening - Comments:: Wears rx glasses - up to date with routine eye exams with Dr Darroll  Immunizations and Health Maintenance Health Maintenance  Topic Date Due   Medicare Annual Wellness (AWV)  09/29/2025   DTaP/Tdap/Td (2 - Td or Tdap) 10/10/2026   Pneumococcal Vaccine: 50+ Years  Completed   Influenza Vaccine  Completed   DEXA SCAN  Completed   Zoster  Vaccines- Shingrix  Completed   Meningococcal B Vaccine  Aged Out   COVID-19 Vaccine  Discontinued        Assessment/Plan:  This is a routine wellness examination for [place].  Patient Care Team: Katrinka Garnette KIDD, MD as PCP - General (Family Medicine) Fernande Elspeth BROCKS, MD (Inactive) as PCP - Electrophysiology (Cardiology)  I have personally reviewed and noted the following in the patient's chart:   Medical and social history Use of alcohol, tobacco or illicit drugs  Current medications and supplements including opioid prescriptions. Functional ability and status Nutritional status Physical activity Advanced directives List of other physicians Hospitalizations, surgeries, and ER visits in previous 12 months Vitals Screenings to include cognitive, depression, and falls Referrals and appointments  No orders of the defined types were placed in this encounter.  In addition, I have reviewed and discussed with patient certain preventive protocols, quality metrics, and best practice recommendations. A written personalized care plan for preventive services as well as general preventive health recommendations were provided to patient.   Ellouise VEAR Haws, LPN   88/81/7974   Return in 1 year for AWV 10/11/25  After Visit Summary: (MyChart) Due to this being a telephonic visit, the after visit summary with patients personalized plan was offered to patient via MyChart   Nurse Notes: nothing significant at this time,  pt stated she will bring in B/P reading at December appointment

## 2024-09-29 NOTE — Patient Instructions (Signed)
 Kylie Patrick,  Thank you for taking the time for your Medicare Wellness Visit. I appreciate your continued commitment to your health goals. Please review the care plan we discussed, and feel free to reach out if I can assist you further.  Please note that Annual Wellness Visits do not include a physical exam. Some assessments may be limited, especially if the visit was conducted virtually. If needed, we may recommend an in-person follow-up with your provider.  Ongoing Care Seeing your primary care provider every 3 to 6 months helps us  monitor your health and provide consistent, personalized care.   Referrals If a referral was made during today's visit and you haven't received any updates within two weeks, please contact the referred provider directly to check on the status.  Recommended Screenings:  Health Maintenance  Topic Date Due   Medicare Annual Wellness Visit  09/29/2025   DTaP/Tdap/Td vaccine (2 - Td or Tdap) 10/10/2026   Pneumococcal Vaccine for age over 29  Completed   Flu Shot  Completed   DEXA scan (bone density measurement)  Completed   Zoster (Shingles) Vaccine  Completed   Meningitis B Vaccine  Aged Out   COVID-19 Vaccine  Discontinued       09/29/2024    8:41 AM  Advanced Directives  Does Patient Have a Medical Advance Directive? Yes  Type of Advance Directive Healthcare Power of Attorney  Copy of Healthcare Power of Attorney in Chart? No - copy requested    Vision: Annual vision screenings are recommended for early detection of glaucoma, cataracts, and diabetic retinopathy. These exams can also reveal signs of chronic conditions such as diabetes and high blood pressure.  Dental: Annual dental screenings help detect early signs of oral cancer, gum disease, and other conditions linked to overall health, including heart disease and diabetes.  Please see the attached documents for additional preventive care recommendations.

## 2024-10-06 ENCOUNTER — Ambulatory Visit: Admitting: Family Medicine

## 2024-10-06 ENCOUNTER — Other Ambulatory Visit: Payer: Self-pay

## 2024-10-06 VITALS — BP 118/74 | HR 70 | Ht 59.0 in

## 2024-10-06 DIAGNOSIS — M48061 Spinal stenosis, lumbar region without neurogenic claudication: Secondary | ICD-10-CM

## 2024-10-06 DIAGNOSIS — G5601 Carpal tunnel syndrome, right upper limb: Secondary | ICD-10-CM | POA: Diagnosis not present

## 2024-10-06 NOTE — Patient Instructions (Addendum)
 Thank you for coming in today.   You received an injection today. Seek immediate medical attention if the joint becomes red, extremely painful, or is oozing fluid.   Let us  know if we need to repeat that injection  Check back as needed

## 2024-10-06 NOTE — Progress Notes (Signed)
   LILLETTE Ileana Collet, PhD, LAT, ATC acting as a scribe for Artist Lloyd, MD.  Kylie Patrick is a 88 y.o. female who presents to [facility] at [facility] today for f/u LBP s/p ESI on Nov 14th. Pt was last seen by Dr. Lloyd on 08/06/24 and l-spine MRI was ordered and she was prescribed prednisone .  Today, pt reports she had been feeling pretty good and may have overdid it yesterday (holiday shopping, groceries, etc). She thinks the [facility] was really helpful, but today notes burning in both buttocks. She also reports having a lovely experience at [facility] w/ Dr. Derrill and his nurse.  Her R CTS is also flared up today.   Dx testing: 09/02/24 L-spine MRI  08/06/24 L-spine XR 10/03/22 DEXA   Pertinent review of systems: No fevers or chills  Relevant historical information: Right carpal tunnel syndrome previous injection November 2024   Exam:  BP 118/74   Pulse 70   Ht 4' 11 (1.499 m)   SpO2 96%   BMI 30.30 kg/m  General: Well Developed, well nourished, and in no acute distress.   MSK: L-spine decreased lumbar motion lower extremity strength mildly decreased.  Mild antalgic gait.  Left wrist mild swelling nontender to palpation intact strength positive Tinel's left carpal tunnel.  Lab and Radiology Results  Carpal tunnel median nerve hydrodissection Procedure: Real-time Ultrasound Guided median nerve hydrodissection and carpal tunnel injection right Device: Philips Affiniti 50G/GE Logiq Images permanently stored and available for review in PACS Verbal informed consent obtained.  Discussed risks and benefits of procedure. Warned about infection, bleeding, hyperglycemia damage to structures among others. Patient expresses understanding and agreement Time-out conducted.   Noted no overlying erythema, induration, or other signs of local infection.   Skin prepped in a sterile fashion.   Local anesthesia: Topical Ethyl chloride.   With sterile technique and under real time  ultrasound guidance: 40 mg of Kenalog  and 1 mL of lidocaine  injected into carpal tunnel around the median nerve. Fluid seen entering the carpal tunnel.   Completed without difficulty   Pain immediately resolved suggesting accurate placement of the medication.   Advised to call if fevers/chills, erythema, induration, drainage, or persistent bleeding.   Images permanently stored and available for review in the ultrasound unit.  Impression: Technically successful ultrasound guided injection.           Assessment and Plan: 88 y.o. female with left carpal tunnel syndrome plan for injection today.  Continue night splint.  Patient did have an MRI lumbar spine in the interim room which showed multilevel severe spinal stenosis.  She did have an epidural steroid injection a few weeks ago which did help quite a bit.  We can repeat this injection in the future if needed.  I do not think she is a great surgical candidate and Ms. Colvard is not interested in surgery as well.  Recheck back as needed.   PDMP not reviewed this encounter. Orders Placed This Encounter  Procedures   US  LIMITED JOINT SPACE STRUCTURES UP RIGHT(NO LINKED CHARGES)    Reason for Exam (SYMPTOM  OR DIAGNOSIS REQUIRED):   right wrist pain    Preferred imaging location?:   [facility]   No orders of the defined types were placed in this encounter.    Discussed warning signs or symptoms. Please see discharge instructions. Patient expresses understanding.   The above documentation has been reviewed and is accurate and complete Artist Lloyd, M.D.

## 2024-10-12 ENCOUNTER — Ambulatory Visit

## 2024-10-12 DIAGNOSIS — I5032 Chronic diastolic (congestive) heart failure: Secondary | ICD-10-CM | POA: Diagnosis not present

## 2024-10-13 LAB — CUP PACEART REMOTE DEVICE CHECK
Battery Remaining Longevity: 96 mo
Battery Voltage: 3.08 V
Brady Statistic AP VP Percent: 46.38 %
Brady Statistic AP VS Percent: 0 %
Brady Statistic AS VP Percent: 53.58 %
Brady Statistic AS VS Percent: 0.04 %
Brady Statistic RA Percent Paced: 46.29 %
Brady Statistic RV Percent Paced: 99.96 %
Date Time Interrogation Session: 20251201223146
Implantable Lead Connection Status: 753985
Implantable Lead Connection Status: 753985
Implantable Lead Implant Date: 20170906
Implantable Lead Implant Date: 20170906
Implantable Lead Location: 753859
Implantable Lead Location: 753860
Implantable Lead Model: 3830
Implantable Lead Model: 5076
Implantable Pulse Generator Implant Date: 20250527
Lead Channel Impedance Value: 304 Ohm
Lead Channel Impedance Value: 380 Ohm
Lead Channel Impedance Value: 418 Ohm
Lead Channel Impedance Value: 475 Ohm
Lead Channel Pacing Threshold Amplitude: 0.5 V
Lead Channel Pacing Threshold Amplitude: 1.25 V
Lead Channel Pacing Threshold Pulse Width: 0.4 ms
Lead Channel Pacing Threshold Pulse Width: 0.4 ms
Lead Channel Sensing Intrinsic Amplitude: 1.625 mV
Lead Channel Sensing Intrinsic Amplitude: 1.625 mV
Lead Channel Setting Pacing Amplitude: 1.5 V
Lead Channel Setting Pacing Amplitude: 2.5 V
Lead Channel Setting Pacing Pulse Width: 1 ms
Lead Channel Setting Sensing Sensitivity: 1.2 mV
Zone Setting Status: 755011

## 2024-10-16 NOTE — Progress Notes (Signed)
 Remote PPM Transmission

## 2024-10-22 ENCOUNTER — Encounter: Payer: Self-pay | Admitting: Family Medicine

## 2024-10-22 ENCOUNTER — Ambulatory Visit: Admitting: Family Medicine

## 2024-10-22 VITALS — BP 112/62 | HR 69 | Temp 97.5°F | Ht 59.0 in | Wt 150.6 lb

## 2024-10-22 DIAGNOSIS — I442 Atrioventricular block, complete: Secondary | ICD-10-CM | POA: Diagnosis not present

## 2024-10-22 DIAGNOSIS — Z950 Presence of cardiac pacemaker: Secondary | ICD-10-CM | POA: Diagnosis not present

## 2024-10-22 DIAGNOSIS — E782 Mixed hyperlipidemia: Secondary | ICD-10-CM

## 2024-10-22 DIAGNOSIS — I10 Essential (primary) hypertension: Secondary | ICD-10-CM

## 2024-10-22 DIAGNOSIS — Z95811 Presence of heart assist device: Secondary | ICD-10-CM | POA: Diagnosis not present

## 2024-10-22 DIAGNOSIS — C8291 Follicular lymphoma, unspecified, lymph nodes of head, face, and neck: Secondary | ICD-10-CM | POA: Diagnosis not present

## 2024-10-22 NOTE — Progress Notes (Signed)
 Phone [phone] In person visit   Subjective:   Kylie Patrick is a 88 y.o. year old very pleasant female patient who presents for/with See problem oriented charting Chief Complaint  Patient presents with   Medical Management of Chronic Issues    6 month follow up; complains of bilateral hand cramps and leg cramps but hands hurt worse; concerned about Dr. Maryla plan for her oral cancer;    Past Medical History-  Patient Active Problem List   Diagnosis Date Noted   Follicular lymphoma (HCC) 08/20/2024    Priority: High   Atrial fibrillation (HCC) -SCAF 08/14/2022    Priority: High   Pacemaker - MDT 08/11/2020    Priority: High   Complete heart block (HCC) 04/05/2020    Priority: High   Neuropathy 10/09/2023    Priority: Medium    Aortic atherosclerosis 04/26/2020    Priority: Medium    Osteoporosis 04/25/2020    Priority: Medium    Carpal tunnel syndrome, right 05/02/2019    Priority: Medium    Diastolic dysfunction 07/12/2016    Priority: Medium    Hyperglycemia 10/09/2014    Priority: Medium    GERD (gastroesophageal reflux disease) 10/22/2013    Priority: Medium    CKD (chronic kidney disease) stage 3, GFR 30-59 ml/min (HCC) 05/05/2008    Priority: Medium    Anxiety state 04/20/2008    Priority: Medium    HYPERLIPIDEMIA 08/12/2007    Priority: Medium    Essential hypertension 08/12/2007    Priority: Medium    Rash 07/04/2017    Priority: Low   Hiatal hernia 08/03/2013    Priority: Low   History of colonic polyps 01/09/2013    Priority: Low   NIGHT SWEATS 02/19/2008    Priority: Low   Presence of heart assist device (HCC) 10/22/2024   Spinal stenosis, lumbar region, without neurogenic claudication 09/04/2024   Exercise intolerance 08/14/2022    Medications- reviewed and updated Current Outpatient Medications  Medication Sig Dispense Refill   ALPRAZolam  (XANAX ) 0.25 MG tablet Take 1 tablet (0.25 mg total) by mouth 2 (two) times daily as needed for  anxiety (Do not drive for 8 hours after taking.  If any falls on medicine stop immediately and let us  know). 30 tablet 0   Ascorbic Acid (VITAMIN C PO) Take 1 tablet by mouth daily.     Calcium in Bone Mineral Cmplx 350 MG MISC Take 1 each by mouth in the morning, at noon, and at bedtime.     carvedilol  (COREG ) 6.25 MG tablet Take 1 tablet (6.25 mg total) by mouth 2 (two) times daily with a meal. 180 tablet 3   Cholecalciferol (VITAMIN D3) 2000 UNITS TABS Take 1 tablet by mouth daily with lunch.     Cyanocobalamin  (VITAMIN B-12 PO) Take 1 capsule by mouth daily at 2 am.     esomeprazole  (NEXIUM ) 40 MG capsule TAKE 1 CAPSULE BY MOUTH TWICE A DAY BEFORE A MEAL 180 capsule 3   ezetimibe -simvastatin  (VYTORIN ) 10-20 MG tablet TAKE 1 TABLET BY MOUTH EVERYDAY AT BEDTIME 90 tablet 2   famotidine  (PEPCID ) 40 MG tablet TAKE 1 TABLET BY MOUTH EVERYDAY AT BEDTIME 90 tablet 3   furosemide  (LASIX ) 40 MG tablet TAKE 1 TABLET BY MOUTH EVERY DAY 90 tablet 3   gabapentin  (NEURONTIN ) 100 MG capsule Take 1 pill in the morning and 2 before bed 270 capsule 3   losartan  (COZAAR ) 25 MG tablet Take 0.5 tablets (12.5 mg total) by mouth daily. 45  tablet 3   Magnesium  400 MG CAPS Take 400 mg by mouth at bedtime.     Omega-3 Fatty Acids (FISH OIL) 500 MG CAPS Take 500 mg by mouth daily with lunch.     potassium chloride  SA (KLOR-CON  M) 20 MEQ tablet Take 1 tablet (20 mEq total) by mouth daily. 90 tablet 1   No current facility-administered medications for this visit.     Objective:  BP 112/62 (BP Location: Left Arm, Patient Position: Sitting, Cuff Size: Normal)   Pulse 69   Temp (!) 97.5 F (36.4 C) (Temporal)   Ht 4' 11 (1.499 m)   Wt 150 lb 9.6 oz (68.3 kg)   SpO2 97%   BMI 30.42 kg/m  Gen: NAD, resting comfortably CV: RRR no murmurs rubs or gallops Lungs: CTAB no crackles, wheeze, rhonchi Ext: no edema Skin: warm, dry Nontender joints in hands     Assessment and Plan   #Cramping- notes in her hands  and legs usually . Worse since the injections. Trying to stay hydrated. Mustard bothers her hernia. Tums helps the cramping. Could try coconut water. Could be related to her cholesterol medicine as well.  Opts out of bloodwork - does have carpal tunnel and did get injection recently- not planning on surgery -getting some injections in the back- helping some but may need more- blood pressure has bene somewhat higher but looks good today  # Follicular lymphoma-followed by Dr. Waunita removed and PET/CT reassuring-recurrence of follicular lymphoma  tissue - on monitoring right now- has been given option of recheck q4-6 months or radiation as surgery would be extensive/challenging. Had also discussed getting 2nd opinion. Knot is stable in her cheek. Has visit in January.   % complete heart block- Led to pacemaker placement - follows with Dr. Cindie (prior Dr. Fernande) yearly. q month checks at home - things are going well -Updated generator Apr 07, 2024  #hypertension S: medication:  Carvedilol  6.25 mg BID , Lasix  40mg  daily, losartan  12.5mg  (reduced by Dr. Prescilla from 100mg  originally to 25 mg) BP Readings from Last 3 Encounters:  10/22/24 112/62  10/06/24 118/74  09/29/24 (!) 148/88  A/P:blood pressure well controlled continue current medications. Opts out of electrolyte check and magnesium  today despite cramping- reconsider next visit   -did have recent blood pressure above 140- discussed if #s stay as low as today on furutre visit maybe trial lower lasix   #hyperlipidemia/aortic atherosclerosis-LDL goal at least under 100-ideally under 70 S: Medication:  fish oil , Vytorin  10-20mg  -Prior aspirin  81mg - no bleeding history and takes due to strong family history of coronary artery disease.  Dr. Fernande stopped 08/15/2022 . Also A fib- subclinical- cardiology monitoring- has not recommended anticoagulation or aspirin   Lab Results  Component Value Date   CHOL 151 04/22/2024   HDL 56  04/22/2024   LDLCALC 76 04/22/2024   TRIG 109 04/22/2024   CHOLHDL 2.7 04/22/2024  A/P: Very close to ideal goal on check within 6 months-continue current medication  # Anxiety S:Medication:  Xanax  once a month or less .-No falls or injuries reported  A/P: very sparing use- continue to monitor    #Neuropathy- has seen Dr. Gregg starting 08/10/21- taking gabapentin  mainly as needed but takes pretty consistently at night. Can have balance issues in daytime so rarely uses as spends more time sitting in that case to be careful   # Hyperglycemia/insulin resistance/prediabetes- peak a1c June 2020 at 6.4- will check next time as had in June- along with CBC,  CMP, magnesium , cholesterol. She's watching her sugar intake  % Chronic kidney disease Stage III-follows with Dr. Prescilla S: knows to avoid nsaids.  A/P: has been stbale overall- continue to monitor    Recommended follow up: Return in about 6 months (around 04/22/2025) for physical or sooner if needed.Schedule b4 you leave. Future Appointments  Date Time Provider Department Center  12/08/2024 11:30 AM [facility] None  12/08/2024 12:00 PM Onesimo Emaline Brink, MD [facility] None  01/11/2025  7:00 AM [facility] DEVICE REMOTES [facility]  04/12/2025  7:00 AM [facility] DEVICE REMOTES [facility]  07/12/2025  7:00 AM [facility] DEVICE REMOTES [facility]  10/11/2025  7:00 AM [facility] DEVICE REMOTES [facility]  10/11/2025  8:00 AM [facility] ANNUAL WELLNESS VISIT 1 [facility] [place]  01/10/2026  7:00 AM [facility] DEVICE REMOTES [facility]  04/12/2026  7:00 AM [facility] DEVICE REMOTES [facility]    Lab/Order associations:   ICD-10-CM   1. Essential hypertension  I10     2. Follicular lymphoma of lymph nodes of head, unspecified follicular lymphoma type (HCC)  C82.91     3. HYPERLIPIDEMIA  E78.2     4. Complete heart block (HCC)  I44.2     5. Pacemaker - MDT  Z95.0     6. Presence of heart assist device (HCC)   Z95.811       No orders of the defined types were placed in this encounter.   Return precautions advised.  Garnette Lukes, MD

## 2024-10-22 NOTE — Patient Instructions (Addendum)
 No changes today. Could try some coconut water to see if that helps cramps. We also considered reducing lasix  and will look at that next visit further if persistent and blood pressure looks as good  Recommended follow up: Return in about 6 months (around 04/22/2025) for physical or sooner if needed.Schedule b4 you leave.

## 2024-10-31 ENCOUNTER — Other Ambulatory Visit: Payer: Self-pay | Admitting: Family Medicine

## 2024-11-14 ENCOUNTER — Ambulatory Visit: Payer: Self-pay | Admitting: Cardiology

## 2024-12-02 NOTE — Telephone Encounter (Signed)
 Received a call from the patient to reschedule her appt on 1/27 because she is worried about the weather. I got her rescheduled to the next available. She is aware.

## 2024-12-08 ENCOUNTER — Inpatient Hospital Stay: Admitting: Hematology

## 2024-12-08 ENCOUNTER — Inpatient Hospital Stay

## 2024-12-23 ENCOUNTER — Inpatient Hospital Stay

## 2024-12-23 ENCOUNTER — Inpatient Hospital Stay: Admitting: Hematology

## 2025-01-11 ENCOUNTER — Encounter

## 2025-04-12 ENCOUNTER — Encounter

## 2025-04-29 ENCOUNTER — Encounter: Admitting: Family Medicine

## 2025-07-12 ENCOUNTER — Encounter

## 2025-10-11 ENCOUNTER — Encounter

## 2025-10-11 ENCOUNTER — Ambulatory Visit

## 2026-01-10 ENCOUNTER — Encounter

## 2026-04-12 ENCOUNTER — Encounter
